# Patient Record
Sex: Female | Born: 1945 | Race: Black or African American | Hispanic: No | Smoking: Former smoker
Health system: Southern US, Community
[De-identification: ages and names within clinical notes are randomized; demographics above are authoritative.]

## PROBLEM LIST (undated history)

## (undated) DIAGNOSIS — M7060 Trochanteric bursitis, unspecified hip: Secondary | ICD-10-CM

## (undated) DIAGNOSIS — C50919 Malignant neoplasm of unspecified site of unspecified female breast: Secondary | ICD-10-CM

## (undated) DIAGNOSIS — E785 Hyperlipidemia, unspecified: Secondary | ICD-10-CM

## (undated) DIAGNOSIS — R21 Rash and other nonspecific skin eruption: Secondary | ICD-10-CM

## (undated) DIAGNOSIS — C801 Malignant (primary) neoplasm, unspecified: Secondary | ICD-10-CM

## (undated) DIAGNOSIS — Z9289 Personal history of other medical treatment: Secondary | ICD-10-CM

## (undated) DIAGNOSIS — M1990 Unspecified osteoarthritis, unspecified site: Secondary | ICD-10-CM

## (undated) DIAGNOSIS — Z9889 Other specified postprocedural states: Principal | ICD-10-CM

## (undated) DIAGNOSIS — I10 Essential (primary) hypertension: Secondary | ICD-10-CM

## (undated) DIAGNOSIS — M549 Dorsalgia, unspecified: Secondary | ICD-10-CM

## (undated) DIAGNOSIS — M069 Rheumatoid arthritis, unspecified: Secondary | ICD-10-CM

## (undated) DIAGNOSIS — Z923 Personal history of irradiation: Secondary | ICD-10-CM

## (undated) HISTORY — DX: Rash and other nonspecific skin eruption: R21

## (undated) HISTORY — DX: Dorsalgia, unspecified: M54.9

## (undated) HISTORY — DX: Essential (primary) hypertension: I10

## (undated) HISTORY — DX: Personal history of other medical treatment: Z92.89

## (undated) HISTORY — PX: OTHER SURGICAL HISTORY: SHX169

## (undated) HISTORY — DX: Unspecified osteoarthritis, unspecified site: M19.90

## (undated) HISTORY — PX: ABDOMINAL HYSTERECTOMY: SHX81

## (undated) HISTORY — PX: BREAST SURGERY: SHX581

## (undated) HISTORY — DX: Malignant (primary) neoplasm, unspecified: C80.1

## (undated) HISTORY — DX: Trochanteric bursitis, unspecified hip: M70.60

## (undated) HISTORY — DX: Hyperlipidemia, unspecified: E78.5

## (undated) HISTORY — DX: Other specified postprocedural states: Z98.890

## (undated) HISTORY — PX: APPENDECTOMY: SHX54

## (undated) HISTORY — DX: Rheumatoid arthritis, unspecified: M06.9

---

## 1989-12-03 HISTORY — PX: BREAST EXCISIONAL BIOPSY: SUR124

## 1999-05-25 ENCOUNTER — Other Ambulatory Visit: Admission: RE | Admit: 1999-05-25 | Discharge: 1999-05-25 | Payer: Self-pay | Admitting: Obstetrics

## 1999-11-24 ENCOUNTER — Emergency Department (HOSPITAL_COMMUNITY): Admission: EM | Admit: 1999-11-24 | Discharge: 1999-11-24 | Payer: Self-pay | Admitting: Emergency Medicine

## 2002-02-19 ENCOUNTER — Encounter: Admission: RE | Admit: 2002-02-19 | Discharge: 2002-02-19 | Payer: Self-pay | Admitting: Family Medicine

## 2002-02-19 ENCOUNTER — Encounter: Payer: Self-pay | Admitting: Family Medicine

## 2002-03-04 ENCOUNTER — Encounter: Payer: Self-pay | Admitting: Obstetrics

## 2002-03-04 ENCOUNTER — Encounter: Admission: RE | Admit: 2002-03-04 | Discharge: 2002-03-04 | Payer: Self-pay | Admitting: Obstetrics

## 2003-09-08 ENCOUNTER — Encounter: Payer: Self-pay | Admitting: Obstetrics

## 2003-09-08 ENCOUNTER — Encounter: Admission: RE | Admit: 2003-09-08 | Discharge: 2003-09-08 | Payer: Self-pay | Admitting: Obstetrics

## 2003-12-01 ENCOUNTER — Ambulatory Visit (HOSPITAL_COMMUNITY): Admission: RE | Admit: 2003-12-01 | Discharge: 2003-12-01 | Payer: Self-pay | Admitting: Gastroenterology

## 2004-09-08 ENCOUNTER — Encounter: Admission: RE | Admit: 2004-09-08 | Discharge: 2004-09-08 | Payer: Self-pay | Admitting: Obstetrics

## 2005-10-08 ENCOUNTER — Encounter: Admission: RE | Admit: 2005-10-08 | Discharge: 2005-10-08 | Payer: Self-pay | Admitting: Obstetrics

## 2006-10-10 ENCOUNTER — Encounter: Admission: RE | Admit: 2006-10-10 | Discharge: 2006-10-10 | Payer: Self-pay | Admitting: Obstetrics

## 2007-10-17 ENCOUNTER — Encounter: Admission: RE | Admit: 2007-10-17 | Discharge: 2007-10-17 | Payer: Self-pay | Admitting: Obstetrics

## 2008-11-15 ENCOUNTER — Encounter: Admission: RE | Admit: 2008-11-15 | Discharge: 2008-11-15 | Payer: Self-pay | Admitting: Obstetrics

## 2009-01-11 ENCOUNTER — Encounter: Admission: RE | Admit: 2009-01-11 | Discharge: 2009-01-11 | Payer: Self-pay | Admitting: Rheumatology

## 2010-09-12 ENCOUNTER — Encounter: Admission: RE | Admit: 2010-09-12 | Discharge: 2010-09-12 | Payer: Self-pay | Admitting: Obstetrics

## 2010-09-21 ENCOUNTER — Encounter: Admission: RE | Admit: 2010-09-21 | Discharge: 2010-09-21 | Payer: Self-pay | Admitting: Obstetrics

## 2010-12-23 ENCOUNTER — Encounter: Payer: Self-pay | Admitting: Obstetrics

## 2011-04-20 NOTE — Op Note (Signed)
NAME:  Melissa Perez, Melissa Perez NO.:  [id number]   MEDICAL RECORD NO.:  [id number]                   PATIENT TYPE:  AMB   LOCATION:  ENDO                                 FACILITY:  [facility]   PHYSICIAN:  Graylin Shiver, M.D.                DATE OF BIRTH:  03/20/1946   DATE OF PROCEDURE:  12/01/2003  DATE OF DISCHARGE:                                 OPERATIVE REPORT   PROCEDURE:  Colonoscopy.   INDICATIONS FOR PROCEDURE:  Rectal bleeding, family history of colon cancer.   Informed consent was obtained after explanation of the risks of bleeding,  infection and perforation.   PREMEDICATION:  Fentanyl 62.5 mcg IV, Versed 6 mg IV.   DESCRIPTION OF PROCEDURE:  With the patient in the left lateral decubitus  position, a rectal exam was performed, no masses were felt. There were some  external hemorrhoidal tags.  The Olympus pediatric adjustable colonoscope  was inserted into the rectum and advanced around a tortous colon to the  cecum.  Cecal landmarks were identified. The cecum and ascending colon were  normal, the transverse colon was normal. The descending colon, sigmoid and  rectum were normal.  She tolerated the procedure well without complications.  The scope was retroflexed in the rectum, no abnormalities were seen.   IMPRESSION:  Normal colonoscopy to the cecum with some external hemorrhoids.                                               Graylin Shiver, M.D.    Melissa Perez  D:  12/01/2003  T:  12/01/2003  Job:  [id number]   cc:   [name], M.D.  [address]  [place]  [place] [address]  Fax: [phone]

## 2011-08-27 ENCOUNTER — Other Ambulatory Visit: Payer: Self-pay | Admitting: Obstetrics

## 2011-08-27 DIAGNOSIS — Z1231 Encounter for screening mammogram for malignant neoplasm of breast: Secondary | ICD-10-CM

## 2011-09-24 ENCOUNTER — Ambulatory Visit
Admission: RE | Admit: 2011-09-24 | Discharge: 2011-09-24 | Disposition: A | Discharge status: Home / Self Care | Payer: BC Managed Care – PPO | Source: Ambulatory Visit | Attending: Obstetrics | Admitting: Obstetrics

## 2011-09-24 DIAGNOSIS — Z1231 Encounter for screening mammogram for malignant neoplasm of breast: Secondary | ICD-10-CM

## 2011-09-26 ENCOUNTER — Other Ambulatory Visit: Payer: Self-pay | Admitting: Obstetrics

## 2011-09-26 DIAGNOSIS — R928 Other abnormal and inconclusive findings on diagnostic imaging of breast: Secondary | ICD-10-CM

## 2011-10-11 ENCOUNTER — Ambulatory Visit
Admission: RE | Admit: 2011-10-11 | Discharge: 2011-10-11 | Disposition: A | Discharge status: Home / Self Care | Payer: BC Managed Care – PPO | Source: Ambulatory Visit | Attending: Obstetrics | Admitting: Obstetrics

## 2011-10-11 DIAGNOSIS — R928 Other abnormal and inconclusive findings on diagnostic imaging of breast: Secondary | ICD-10-CM

## 2011-10-16 ENCOUNTER — Other Ambulatory Visit: Payer: Self-pay | Admitting: Obstetrics

## 2011-10-16 DIAGNOSIS — R921 Mammographic calcification found on diagnostic imaging of breast: Secondary | ICD-10-CM

## 2011-10-23 ENCOUNTER — Other Ambulatory Visit: Payer: Self-pay | Admitting: Obstetrics

## 2011-10-23 ENCOUNTER — Ambulatory Visit
Admission: RE | Admit: 2011-10-23 | Discharge: 2011-10-23 | Disposition: A | Discharge status: Home / Self Care | Payer: BC Managed Care – PPO | Source: Ambulatory Visit | Attending: Obstetrics | Admitting: Obstetrics

## 2011-10-23 DIAGNOSIS — R921 Mammographic calcification found on diagnostic imaging of breast: Secondary | ICD-10-CM

## 2011-10-23 DIAGNOSIS — Z9889 Other specified postprocedural states: Secondary | ICD-10-CM

## 2011-10-24 ENCOUNTER — Other Ambulatory Visit: Payer: Self-pay | Admitting: Obstetrics

## 2011-10-24 ENCOUNTER — Ambulatory Visit
Admission: RE | Admit: 2011-10-24 | Discharge: 2011-10-24 | Disposition: A | Discharge status: Home / Self Care | Payer: BC Managed Care – PPO | Source: Ambulatory Visit | Attending: Obstetrics | Admitting: Obstetrics

## 2011-10-24 DIAGNOSIS — Z9889 Other specified postprocedural states: Secondary | ICD-10-CM

## 2011-10-24 DIAGNOSIS — C50911 Malignant neoplasm of unspecified site of right female breast: Secondary | ICD-10-CM

## 2011-10-29 ENCOUNTER — Other Ambulatory Visit: Payer: Self-pay | Admitting: *Deleted

## 2011-10-29 ENCOUNTER — Telehealth: Payer: Self-pay | Admitting: *Deleted

## 2011-10-29 ENCOUNTER — Ambulatory Visit
Admission: RE | Admit: 2011-10-29 | Discharge: 2011-10-29 | Disposition: A | Discharge status: Home / Self Care | Payer: BC Managed Care – PPO | Source: Ambulatory Visit | Attending: Obstetrics | Admitting: Obstetrics

## 2011-10-29 DIAGNOSIS — C50911 Malignant neoplasm of unspecified site of right female breast: Secondary | ICD-10-CM

## 2011-10-29 DIAGNOSIS — D0510 Intraductal carcinoma in situ of unspecified breast: Secondary | ICD-10-CM

## 2011-10-29 MED ORDER — GADOBENATE DIMEGLUMINE 529 MG/ML IV SOLN
15.0000 mL | Freq: Once | INTRAVENOUS | Status: AC | PRN
  Administered 2011-10-29: 15 mL via INTRAVENOUS

## 2011-10-29 NOTE — Telephone Encounter (Signed)
Confirmed [facility] for 10/31/11 at 0815 .  Instructions and contact information given.  

## 2011-10-31 ENCOUNTER — Ambulatory Visit
Admission: RE | Admit: 2011-10-31 | Discharge: 2011-10-31 | Disposition: A | Discharge status: Home / Self Care | Payer: BC Managed Care – PPO | Source: Ambulatory Visit | Attending: Radiation Oncology | Admitting: Radiation Oncology

## 2011-10-31 ENCOUNTER — Ambulatory Visit (HOSPITAL_BASED_OUTPATIENT_CLINIC_OR_DEPARTMENT_OTHER): Payer: BC Managed Care – PPO | Admitting: Oncology

## 2011-10-31 ENCOUNTER — Encounter (INDEPENDENT_AMBULATORY_CARE_PROVIDER_SITE_OTHER): Payer: Self-pay | Admitting: General Surgery

## 2011-10-31 ENCOUNTER — Encounter: Payer: Self-pay | Admitting: Oncology

## 2011-10-31 ENCOUNTER — Other Ambulatory Visit (HOSPITAL_BASED_OUTPATIENT_CLINIC_OR_DEPARTMENT_OTHER): Payer: BC Managed Care – PPO | Admitting: Lab

## 2011-10-31 ENCOUNTER — Ambulatory Visit (HOSPITAL_BASED_OUTPATIENT_CLINIC_OR_DEPARTMENT_OTHER): Payer: BC Managed Care – PPO | Admitting: General Surgery

## 2011-10-31 ENCOUNTER — Telehealth: Payer: Self-pay | Admitting: *Deleted

## 2011-10-31 ENCOUNTER — Ambulatory Visit: Payer: BC Managed Care – PPO | Admitting: Physical Therapy

## 2011-10-31 ENCOUNTER — Ambulatory Visit: Payer: BC Managed Care – PPO

## 2011-10-31 DIAGNOSIS — C50519 Malignant neoplasm of lower-outer quadrant of unspecified female breast: Secondary | ICD-10-CM

## 2011-10-31 DIAGNOSIS — D0590 Unspecified type of carcinoma in situ of unspecified breast: Secondary | ICD-10-CM

## 2011-10-31 DIAGNOSIS — D0510 Intraductal carcinoma in situ of unspecified breast: Secondary | ICD-10-CM

## 2011-10-31 DIAGNOSIS — Z510 Encounter for antineoplastic radiation therapy: Secondary | ICD-10-CM | POA: Insufficient documentation

## 2011-10-31 DIAGNOSIS — Z803 Family history of malignant neoplasm of breast: Secondary | ICD-10-CM

## 2011-10-31 DIAGNOSIS — C50511 Malignant neoplasm of lower-outer quadrant of right female breast: Secondary | ICD-10-CM | POA: Diagnosis present

## 2011-10-31 DIAGNOSIS — Z170 Estrogen receptor positive status [ER+]: Secondary | ICD-10-CM

## 2011-10-31 DIAGNOSIS — I10 Essential (primary) hypertension: Secondary | ICD-10-CM

## 2011-10-31 LAB — CBC WITH DIFFERENTIAL/PLATELET
Basophils Absolute: 0.1 10*3/uL (ref 0.0–0.1)
Eosinophils Absolute: 0.1 10*3/uL (ref 0.0–0.5)
HGB: 13.9 g/dL (ref 11.6–15.9)
MCV: 90.8 fL (ref 79.5–101.0)
MONO#: 0.4 10*3/uL (ref 0.1–0.9)
NEUT#: 3 10*3/uL (ref 1.5–6.5)
RDW: 13.4 % (ref 11.2–14.5)
WBC: 6.7 10*3/uL (ref 3.9–10.3)
lymph#: 3.2 10*3/uL (ref 0.9–3.3)

## 2011-10-31 LAB — COMPREHENSIVE METABOLIC PANEL
Albumin: 4 g/dL (ref 3.5–5.2)
BUN: 12 mg/dL (ref 6–23)
CO2: 30 mEq/L (ref 19–32)
Calcium: 10 mg/dL (ref 8.4–10.5)
Chloride: 102 mEq/L (ref 96–112)
Glucose, Bld: 90 mg/dL (ref 70–99)
Potassium: 3.8 mEq/L (ref 3.5–5.3)
Sodium: 141 mEq/L (ref 135–145)
Total Protein: 7.1 g/dL (ref 6.0–8.3)

## 2011-10-31 NOTE — Progress Notes (Signed)
Patient ID: Melissa Perez, female   DOB: 10/30/1946, 65 y.o.   MRN: [id number]  No chief complaint on file.   HPI Melissa Perez is a 65 y.o. female.   HPI She is referred by Dr. 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 On a screening mammogram, she had abnormal microcalcifications in this area. Image guided biopsy demonstrated the pathology as above. Hormone receptors are positive. She denies any masses in her breast or nipple discharge. She does have a family history of breast cancer. Age at menarche was 14. She had her first pregnancy at age 20. Menopause was at age 39. No hormone replacement treatment.  Past Medical History  Diagnosis Date  . Arthritis     rheumatoid, take Humira  . Hypertension   . Hyperlipidemia     Past Surgical History  Procedure Date  . Abdominal hysterectomy   . Appendectomy   . Left breast biopsies 1991 & 1990  . Breast surgery     2 left breast biopsies-benign    Family History  Problem Relation Age of Onset  . Breast cancer Mother   . Cancer Mother   . Kidney cancer Father   . Cancer Father   . Breast cancer Sister   . Cancer Sister   . Lung cancer Sister   . Colon cancer Maternal Uncle     Social History History  Substance Use Topics  . Smoking status: Never Smoker   . Smokeless tobacco: Not on file  . Alcohol Use: 1.8 oz/week    3 Glasses of wine per week    No Known Allergies  Current Outpatient Prescriptions  Medication Sig Dispense Refill  . calcium carbonate (TUMS - DOSED IN MG ELEMENTAL CALCIUM) 500 MG chewable tablet Chew 1 tablet by mouth as needed.        . iron polysaccharides (NIFEREX 60) 40-20 MG capsule Take 1 capsule by mouth 3 (three) times daily with meals.        . loratadine (CLARITIN) 10 MG tablet Take 10 mg by mouth daily.        . multivitamin (THERAGRAN) per tablet Take 1 tablet by mouth daily.        .  valsartan-hydrochlorothiazide (DIOVAN-HCT) 80-12.5 MG per tablet Take 1 tablet by mouth daily.          Review of Systems Review of Systems  Constitutional: Positive for fatigue.  HENT:       Sinus problems  Eyes: Negative.   Respiratory: Negative.   Cardiovascular: Negative.   Gastrointestinal: Negative.   Genitourinary: Negative.   Musculoskeletal: Positive for arthralgias.  Neurological: Negative.   Hematological: Negative.   Psychiatric/Behavioral: Negative.     There were no vitals taken for this visit.  Physical Exam Physical Exam  Constitutional: She is oriented to person, place, and time. She appears well-developed and well-nourished. No distress.  HENT:  Head: Normocephalic and atraumatic.  Eyes: Conjunctivae and EOM are normal.  Neck: Neck supple. No thyromegaly present.  Cardiovascular: Normal rate and regular rhythm.   No murmur heard. Pulmonary/Chest: Effort normal and breath sounds normal. She has no wheezes.       Left breast-2 scars are present with mild indentation medially. No palpable masses.  Right breast-small scar inferior aspect. No palpable masses.  Lymph nodes-no palpable axillary or supraclavicular adenopathy  Abdominal: Soft. Bowel sounds are normal. She exhibits no   distension and no mass.       Midline scar  Musculoskeletal: Normal range of motion. She exhibits no edema and no tenderness.  Lymphadenopathy:    She has no cervical adenopathy.  Neurological: She is alert and oriented to person, place, and time.       Normal motor strength  Skin: Skin is warm and dry.  Psychiatric: She has a normal mood and affect. Her behavior is normal.    Data Reviewed Imaging results. Pathology results.  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 She is a good candidate for breast conservation therapy. She is interested in this. We did discuss mastectomy. She will be getting genetic testing and if this is negative we can  proceed with right partial mastectomy and sentinel lymph node biopsy. It is positive, we will discuss bilateral mastectomies with her.    Plan    Right partial mastectomy and sentinel little biopsy pending genetic results.  I have explained the procedure, risks, and aftercare.  The risks include but are not limited to bleeding, infection, wound problems, seroma formation, anesthesia, nerve injury, lymphedema, need for reexcision or removal of more lymph nodes at a later time.  She seems to understand and agrees with the plan.       Gladiola Madore J 10/31/2011, 10:25 AM    

## 2011-10-31 NOTE — Patient Instructions (Signed)
My office will call you to schedule your surgery once we have the results of your genetic testing.

## 2011-10-31 NOTE — Telephone Encounter (Signed)
gave patient appointment for 11-20-2011 at 8:30am

## 2011-10-31 NOTE — Progress Notes (Signed)
Melissa Perez [id number] [birth year] 65 y.o. 10/31/2011 1:14 PM  CC  Dr. Renaye Rakers Dr. Francoise Ceo Dr. Pollyann Savoy     REASON FOR CONSULTATION:  65 year old female with new diagnosis of ductal carcinoma in situ grade 2 with calcifications of the right breast at the 6:00 position 8 cm from the nipple appear the tumor was ER positive PR positive. Measuring 3.7 cm by MRI.  Patient was seen in the Multidisciplinary Breast Clinic for discussion of her treatment options. She was seen by Dr. [name], [facility]  REFERRING PHYSICIAN: Dr. Avel Peace  HISTORY OF PRESENT ILLNESS:  Melissa Perez is a 65 y.o. female.  With medical history significant for hypertension and rheumatoid arthritis. Patient began having screening mammograms at the age of somewhere in the 2s. Her first mammogram was ordered by Dr. Gaynell Face many years ago according to the patient. Most recently she presented for a screening mammogram in October 2012. This revealed calcifications in the right breast. She went on to have a diagnostic digital mammogram of the right breast on 10/11/2011. There was noted to be small 3 mm cluster of heterogeneous calcifications in the inferior right breast. Because of this she went on to have a needle core biopsy performed at the 6:00 position. The pathology revealed a ductal carcinoma in situ grade 3 with calcifications. The tumor was ER positive PR positive suspicious for involving an underlying sclerosis lesion. She then went on to have MRI of the breasts performed on 10/29/2011. The MRI in the right breast showed at the 6:00 location and area of segmental clumped nodular enhancement measuring 3.7 x 1.7 x 1.4 cm.  She is now seen in the multidisciplinary clinic for discussion of her treatment options. She is without any complaints.   Past Medical History: Past Medical History  Diagnosis Date  . Arthritis     rheumatoid,  take Humira  . Hypertension   . Hyperlipidemia     Past Surgical History: Past Surgical History  Procedure Date  . Abdominal hysterectomy   . Appendectomy   . Left breast biopsies 1991 & 1990  . Breast surgery     2 left breast biopsies-benign    Family History: Family History  Problem Relation Age of Onset  . Breast cancer Mother   . Cancer Mother   . Kidney cancer Father   . Cancer Father   . Breast cancer Sister   . Cancer Sister   . Lung cancer Sister   . Colon cancer Maternal Uncle     Social History: Patient is an Production designer, theatre/television/film for higher education. She is divorced she has one son who is 73 and is in the [place] [employer] lieutenant. History  Substance Use Topics  . Smoking status: Never Smoker   . Smokeless tobacco: Not on file  . Alcohol Use: 1.8 oz/week    3 Glasses of wine per week    Allergies: No Known Allergies  Current Medications: Current Outpatient Prescriptions  Medication Sig Dispense Refill  . calcium carbonate (TUMS - DOSED IN MG ELEMENTAL CALCIUM) 500 MG chewable tablet Chew 1 tablet by mouth as needed.        . iron polysaccharides (NIFEREX 60) 40-20 MG capsule Take 1 capsule by mouth 3 (three) times daily with meals.        Marland Kitchen loratadine (CLARITIN) 10 MG tablet Take 10 mg by mouth daily.        . multivitamin (THERAGRAN) per tablet Take 1  tablet by mouth daily.        . valsartan-hydrochlorothiazide (DIOVAN-HCT) 80-12.5 MG per tablet Take 1 tablet by mouth daily.          OB/GYN History: Menarche at age 38 she underwent menopause in November 1986. She had about 2 weeks' worth of hormone replacement therapy. She has given birth to one son at the age of 54. She did use birth control pills from 27 age 58 to about 65 years of age off-and-on.     Prior History of Cancer: She has no prior history of breast cancer but she has had breast biopsies in the past of the left breast.  Health Maintenance:  Colonoscopy her last colonoscopy was in 2006. Bone  Density last bone density was in summer of 2012. Last PAP smear last Pap smear performed was in 2011.  ECOG PERFORMANCE STATUS: 0 - Asymptomatic    REVIEW OF SYSTEMS:  Constitutional: positive for fatigue and night sweats Eyes: negative Ears, nose, mouth, throat, and face: positive for nasal congestion Respiratory: negative Cardiovascular: negative Gastrointestinal: negative Genitourinary:negative Integument/breast: negative Hematologic/lymphatic: positive for easy bruising Musculoskeletal:positive for arthralgias, bone pain and stiff joints Neurological: negative Behavioral/Psych: negative Endocrine: negative Allergic/Immunologic: negative  PHYSICAL EXAMINATION: Blood pressure 124/79, pulse 76, temperature 98.7 F (37.1 C), temperature source Oral, height 5' 2.5" (1.588 m), weight 164 lb 9.6 oz (74.662 kg).  ZOX:WRUEA, healthy, no distress, well nourished and well developed SKIN: skin color, texture, turgor are normal, no rashes or significant lesions HEAD: Normocephalic, No masses, lesions, tenderness or abnormalities EYES: PERRLA, EOMI, Conjunctiva are pink and non-injected, sclera clear EARS: External ears normal OROPHARYNX:no exudate, no erythema and lips, buccal mucosa, and tongue normal  NECK: supple, no adenopathy, no bruits, no JVD, thyroid normal size, non-tender, without nodularity LYMPH:  no palpable lymphadenopathy, no hepatosplenomegaly BREAST: Bilateral breasts are examined. Left breast does reveal previous lumpectomy scar in the upper inner quadrant. Right breast reveals a little bit of an indentation from her recent biopsy and some areas of ecchymosis otherwise no masses and no nipple discharge or retraction or inversion. LUNGS: clear to auscultation , and palpation, clear to auscultation and percussion HEART: regular rate & rhythm, no murmurs and no gallops ABDOMEN:abdomen soft, non-tender, normal bowel sounds and no masses or organomegaly BACK: Back  symmetric, no curvature., No CVA tenderness EXTREMITIES:no edema, no skin discoloration, no clubbing, no cyanosis  NEURO: alert & oriented x 3 with fluent speech, no focal motor/sensory deficits, gait normal, reflexes normal and symmetric    STUDIES/RESULTS: Mr Breast Bilateral W Wo Contrast  10/29/2011  *RADIOLOGY REPORT*  Clinical Data: Newly-diagnosed right breast six o'clock location DCIS manifesting as mammographically evident calcifications.  BUN and creatinine were obtained on site at [facility] at [address] Results:  BUN 9 mg/dL,  Creatinine 0.7 mg/dL.  BILATERAL BREAST MRI WITH AND WITHOUT CONTRAST  Technique: Multiplanar, multisequence MR images of both breasts were obtained prior to and following the intravenous administration of 15ml of Multihance.  Three dimensional images were evaluated at the independent DynaCad workstation.  Comparison:  Prior mammograms  Findings: There is a minimal background type parenchymal enhancement pattern.  No lymphadenopathy.  No abnormal T2-weighted hyperintensity apart from minimal right breast six o'clock location post biopsy change.  In the right breast six o'clock location with associated clip artifact is an area of segmental clumped nodular type enhancement measuring 3.7 x 1.7 x 1.4 cm (greatest dimension is in the anteroposterior orientation).  This corresponds to  the area of recently biopsy-proven DCIS.  No other area of abnormal enhancement is identified.  IMPRESSION: Area of abnormal non mass-like enhancement in the right breast six o'clock location corresponds to biopsy-proven DCIS.  No MRI evidence for multicentric or contralateral malignancy.  THREE-DIMENSIONAL MR IMAGE RENDERING ON INDEPENDENT WORKSTATION:  Three-dimensional MR images were rendered by post-processing of the original MR data on an independent workstation.  The three- dimensional MR images were interpreted, and findings were reported in the accompanying complete MRI  report for this study.  BI-RADS CATEGORY 6:  Known biopsy-proven malignancy - appropriate action should be taken.  Recommendation:  Treatment plan  Original Report Authenticated By: Harrel Lemon, M.D.   Mm Breast Stereo Biopsy Right  10/24/2011  *RADIOLOGY REPORT*  Clinical Data:  Microcalcifications at 6 o'clock 8 cm from the right nipple.  STEREOTACTIC-GUIDED VACUUM ASSISTED BIOPSY OF THE RIGHT BREAST AND SPECIMEN RADIOGRAPH  The patient and I discussed the procedure of stereotactic-guided biopsy, including risks, benefits and alternatives.  Specifically, we discussed the risks of infection, bleeding, tissue injury, clip migration and inadequate sampling.  Informed written consent was given.  Using sterile technique, 2% lidocaine, stereotactic guidance and a 9 gauge vacuum assisted device, biopsy was performed of calcifications at 6 o'clock 8 cm from the right nipple.  Specimen radiograph was performed, showing calcifications within a core specimen.  Specimens with calcifications are identified for pathology.  At the conclusion of the procedure, a top hat shaped tissue marker clip was deployed into the biopsy cavity.  Follow-up 2-view mammogram confirmed clip placement and removal of the calcifications.  IMPRESSION: Stereotactic-guided biopsy of microcalcifications at 6 o'clock 8 cm from the right nipple.  No apparent complications.  Original Report Authenticated By: Daryl Eastern, M.D.   Mm Breast Surgical Specimen  10/24/2011  *RADIOLOGY REPORT*  Clinical Data:  Microcalcifications at 6 o'clock 8 cm from the right nipple.  STEREOTACTIC-GUIDED VACUUM ASSISTED BIOPSY OF THE RIGHT BREAST AND SPECIMEN RADIOGRAPH  The patient and I discussed the procedure of stereotactic-guided biopsy, including risks, benefits and alternatives.  Specifically, we discussed the risks of infection, bleeding, tissue injury, clip migration and inadequate sampling.  Informed written consent was given.  Using sterile  technique, 2% lidocaine, stereotactic guidance and a 9 gauge vacuum assisted device, biopsy was performed of calcifications at 6 o'clock 8 cm from the right nipple.  Specimen radiograph was performed, showing calcifications within a core specimen.  Specimens with calcifications are identified for pathology.  At the conclusion of the procedure, a top hat shaped tissue marker clip was deployed into the biopsy cavity.  Follow-up 2-view mammogram confirmed clip placement and removal of the calcifications.  IMPRESSION: Stereotactic-guided biopsy of microcalcifications at 6 o'clock 8 cm from the right nipple.  No apparent complications.  Original Report Authenticated By: Daryl Eastern, M.D.   Mm Digital Diag Ltd R  10/11/2011  *RADIOLOGY REPORT*  Clinical Data:  Calcifications right breast identified on recent screening mammogram.  DIGITAL DIAGNOSTIC RIGHT MAMMOGRAM WITHOUT CAD  Comparison:  09/14/2011  Findings:  Magnification views of the  inferior and central right breast demonstrate a 3 mm tight cluster of slightly heterogeneous calcifications.  There is surrounding breast parenchyma is negative.  IMPRESSION: Small (3 mm) cluster of heterogeneous calcifications in the inferior right breast.  Ductal carcinoma in situ (DCIS) cannot be excluded.  Stereotactic biopsy is suggested.  The findings and biopsy procedure were discussed with the patient in person today. She has some concerns regarding the  possibility of infection, given some immune compromise related to treatment for rheumatoid arthritis.  She prefers to discuss the possibility of biopsy with Dr. Corliss Skains, prior to scheduling.  If she decides not to schedule a biopsy, we discussed that she should have a follow-up right mammogram in 6 months (May 2013).  BI-RADS CATEGORY 4:  Suspicious abnormality - biopsy should be considered.  Original Report Authenticated By: Britta Mccreedy, M.D.   Mm Radiologist Eval And Mgmt  10/24/2011  *RADIOLOGY REPORT*   ESTABLISHED PATIENT OFFICE VISIT - LEVEL II [phone])  Chief Complaint:  Patient presents for discussion of the pathologic findings.  She underwent stereotactic core needle biopsy of calcifications in the 6 o'clock position of the right breast on 10/23/2011.  History:  Screening mammogram performed on 09/24/2011 demonstrated calcifications in the 6 o'clock position of the right breast posteriorly.  Additional views demonstrated worrisome calcifications and stereotactic core needle biopsy was suggested and performed on 10/23/2011.  Exam:  The biopsy site in the inferior portion of the right  breast is clean and dry.  There is no sign of hematoma or infection.  Assessment and Plan:  Histologic evaluation demonstrates ductal carcinoma in situ, grade II, with calcifications.  This is concordant with the imaging findings.  Results were discussed with the patient.  She was scheduled to be seen in the [facility] on 11/28 08/12.  Breast MRI is scheduled for 10/29/2011.  The patient reports no complications from the procedure.  Her questions were answered.  Educational materials were given.  Original Report Authenticated By: Daryl Eastern, M.D.      Chemistry      Component Value Date/Time   NA 141 10/31/2011 0830   K 3.8 10/31/2011 0830   CL 102 10/31/2011 0830   CO2 30 10/31/2011 0830   BUN 12 10/31/2011 0830   CREATININE 0.87 10/31/2011 0830      Component Value Date/Time   CALCIUM 10.0 10/31/2011 0830   ALKPHOS 109 10/31/2011 0830   AST 31 10/31/2011 0830   ALT 33 10/31/2011 0830   BILITOT 0.3 10/31/2011 0830      Lab Results  Component Value Date   WBC 6.7 10/31/2011   HGB 13.9 10/31/2011   HCT 41.0 10/31/2011   MCV 90.8 10/31/2011   PLT 204 10/31/2011       PATHOLOGY: Right needle core biopsy from 10/23/2011 reveals a ductal carcinoma in situ grade 2 with calcifications ER positive PR positive  ASSESSMENT    65 year old female with  #1 new  diagnosis of ductal carcinoma in situ (Tis NX MX) of the right breast. She is currently status post needle core biopsy. The tumor was a DCIS grade 2 with calcifications ER/PR positive. Patient is seen in the multidisciplinary breast clinic for discussion of her treatment options. She was seen by Dr. [name] myself and Dr. Vick Frees or.    PLAN:    #1 patient was recommended a lumpectomy for breast conservation. This would then be followed by radiation therapy adjuvantly.  #2 she would receive adjuvant antiestrogen therapy consisting of tamoxifen or an aromatase inhibitor. Risks and benefits of this were discussed with the patient.  #3 if patient has purely DCIS then she would be a good candidate or be 45 study I did discuss this with her. Discussed this with her further once patient has had her definitive surgery.  #4 due to patient's family history we did recommend patient be seen by genetic counseling and possibly  had genetic testing. Patient was in agreement with this.  #5 patient will be seen back after her surgery       All questions were answered. The patient knows to call the clinic with any problems, questions or concerns. We can certainly see the patient much sooner if necessary.  Thank you so much for allowing me to participate in the care of Melissa Perez. I will continue to follow up the patient with you and assist in her care.  I spent 30 minutes counseling the patient face to face. The total time spent in the appointment was 40 minutes. [name], MD Medical/Oncology [facility] [phone] (beeper) [phone] (Office)  10/31/2011, 1:14 PM 10/31/2011, 1:14 PM

## 2011-11-01 ENCOUNTER — Other Ambulatory Visit (INDEPENDENT_AMBULATORY_CARE_PROVIDER_SITE_OTHER): Payer: Self-pay | Admitting: General Surgery

## 2011-11-01 DIAGNOSIS — C50911 Malignant neoplasm of unspecified site of right female breast: Secondary | ICD-10-CM

## 2011-11-01 DIAGNOSIS — C50511 Malignant neoplasm of lower-outer quadrant of right female breast: Secondary | ICD-10-CM | POA: Diagnosis present

## 2011-11-01 NOTE — Progress Notes (Signed)
CC:   Melissa Perez, M.D. Melissa Perez, M.D. Melissa Perez, M.D. Melissa Perez, M.D.  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  HISTORY OF PRESENT ILLNESS:  Melissa Perez is a pleasant 65 year old female who presented for a regular screening mammogram.  She was found have a 15-mm area of calcifications.  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  This was ER and PR positive.  An MRI was performed on 10/29/2011 revealing 3.7 x 1.7 x 1.4 cm lesion likely reflecting larger biopsy change.  She has recovered well from her biopsy.  She presents today for my opinion regarding radiation in management of her disease.  PAST MEDICAL HISTORY: 1. Status post appendectomy in the late 1990s. 2. Status post left breast excisional biopsies in 1990 and 1991. 3. Status post total abdominal hysterectomy, bilateral salpingo-     oophorectomy in 1986. 4. Rheumatoid arthritis.  MEDICATIONS:  Humira, Diovan, Women's One-A-Day, iron, Claritin, calcium.  ALLERGIES:  No known drug allergies.  FAMILY HISTORY:  She had a mother with breast cancer in her 22s, a maternal aunt with breast cancer in her 58s, another maternal uncle with colon cancer and a father with kidney cancer.  A paternal aunt had colon cancer.  SOCIAL HISTORY:  She works at [employer].  She is divorced.  She has a son who is the [employer].  She denies any tobacco use.  She drinks 3-4 drinks of wine per week.  GYN HISTORY:  Menarche at 49.  Last period in 1986 after she had her hysterectomy.  She took hormone replacement for about 2 weeks and quit. GX, P1.  REVIEW OF SYSTEMS:  Positive for night sweats, fever, pain in her left thumb and right foot, muscle aches, wearing glasses,  sinus problems, dentures, hoarseness, skin rash, arthritis and rheumatoid arthritis. All other systems are reviewed and found to be negative.  PHYSICAL EXAMINATION:  She is a pleasant female in no distress sitting comfortably on the exam room table.  Vital signs:  Weight 164 pounds. Height 5 feet 3 inches.  Blood pressure 124/79, pulse 76, respirations 20, temperature 98.7.  She has no palpable cervical or supraclavicular adenopathy.  She has no palpable axillary adenopathy.  She has some tenderness in the medial portion of her right breast consistent with her biopsy site.  No other palpable abnormalities.  The left breast reveals a tissue defect medially consistent with her prior excisions.  There is a platelike palpable abnormality over the superior aspect of her left breast which she said is stable since her biopsies in the 1990s.  Heart: Regular rate and rhythm.  Lungs:  Clear to auscultation bilaterally.  IMPRESSION:  A 65 year old female with a newly diagnosed ductal carcinoma in situ.  I discussed with Melissa Perez her prognosis and options for treatment.  We discussed the role of radiation in decreasing local failure in patients who ultimately elect for breast conservation.  We discussed the process of simulation and the placement of tattoos.  We discussed 33 treatments as an outpatient beginning 4-6 weeks after surgery.  She is planning on attending the inauguration in [place], [place] and would like to start her radiation after that which should be no problem.  She will meet with Dr. Abbey Chatters for further discussion regarding surgical options well as  Dr. Welton Flakes for discussion of antiestrogen therapy or involvement on [id number].  We discussed possible side effects of radiation including but not limited to fatigue, skin redness, irritation, darkening and tissue defects.  We discussed secondary malignancies.  At the end of visit, Melissa Perez asked to see her MRI scan.  I will send that in  with her as well as the report with the next physician. She will also be referred to genetic counseling.    ______________________________ [name], M.D. SW/MEDQ  D:  10/31/2011  T:  10/31/2011  Job:  [id number]

## 2011-11-03 HISTORY — PX: BREAST LUMPECTOMY: SHX2

## 2011-11-05 ENCOUNTER — Ambulatory Visit: Payer: BC Managed Care – PPO

## 2011-11-05 NOTE — Progress Notes (Signed)
Blood drawn for BRCA1/BRCA2 and send to [facility]. TAT ~2 weeks.

## 2011-11-09 ENCOUNTER — Telehealth: Payer: Self-pay | Admitting: *Deleted

## 2011-11-09 NOTE — Telephone Encounter (Signed)
Message copied by Cooper Render on Fri Nov 09, 2011  4:05 PM ------      Message from: Melissa Perez      Created: Fri Nov 09, 2011  3:38 PM       Patient should be rescheduled with me 1 week after lumpectomy      Cancel appointment on 12/18

## 2011-11-12 ENCOUNTER — Telehealth: Payer: Self-pay | Admitting: *Deleted

## 2011-11-12 ENCOUNTER — Encounter: Payer: Self-pay | Admitting: *Deleted

## 2011-11-12 ENCOUNTER — Telehealth: Payer: Self-pay | Admitting: Oncology

## 2011-11-12 NOTE — Telephone Encounter (Signed)
Left VM for pt to return call concerning [facility] from 10/05/11.

## 2011-11-12 NOTE — Telephone Encounter (Signed)
per orders from [facility] called pts and r/s appts for 12/18 to 12/31.  called pts home lmovm with new appts d/t

## 2011-11-13 ENCOUNTER — Telehealth: Payer: Self-pay | Admitting: Genetic Counselor

## 2011-11-14 ENCOUNTER — Encounter: Payer: Self-pay | Admitting: *Deleted

## 2011-11-14 NOTE — Progress Notes (Signed)
Mailed after appt letter to pt. 

## 2011-11-15 ENCOUNTER — Other Ambulatory Visit (INDEPENDENT_AMBULATORY_CARE_PROVIDER_SITE_OTHER): Payer: Self-pay | Admitting: General Surgery

## 2011-11-15 DIAGNOSIS — C50919 Malignant neoplasm of unspecified site of unspecified female breast: Secondary | ICD-10-CM

## 2011-11-20 ENCOUNTER — Ambulatory Visit: Payer: BC Managed Care – PPO | Admitting: Oncology

## 2011-11-20 ENCOUNTER — Other Ambulatory Visit: Payer: BC Managed Care – PPO | Admitting: Lab

## 2011-11-21 ENCOUNTER — Encounter (HOSPITAL_BASED_OUTPATIENT_CLINIC_OR_DEPARTMENT_OTHER): Payer: Self-pay | Admitting: *Deleted

## 2011-11-21 ENCOUNTER — Other Ambulatory Visit: Payer: Self-pay

## 2011-11-21 ENCOUNTER — Encounter (HOSPITAL_BASED_OUTPATIENT_CLINIC_OR_DEPARTMENT_OTHER)
Admission: RE | Admit: 2011-11-21 | Discharge: 2011-11-21 | Disposition: A | Discharge status: Home / Self Care | Payer: BC Managed Care – PPO | Source: Ambulatory Visit | Attending: General Surgery | Admitting: General Surgery

## 2011-11-21 LAB — COMPREHENSIVE METABOLIC PANEL
ALT: 34 U/L (ref 0–35)
AST: 32 U/L (ref 0–37)
Alkaline Phosphatase: 115 U/L (ref 39–117)
CO2: 27 mEq/L (ref 19–32)
Calcium: 10.2 mg/dL (ref 8.4–10.5)
GFR calc Af Amer: >90 mL/min (ref >90)
GFR calc non Af Amer: 90 mL/min — ABNORMAL LOW (ref >90)
Glucose, Bld: 88 mg/dL (ref 70–99)
Potassium: 3.4 mEq/L — ABNORMAL LOW (ref 3.5–5.1)
Sodium: 142 mEq/L (ref 135–145)
Total Protein: 7.5 g/dL (ref 6.0–8.3)

## 2011-11-21 LAB — CBC
Hemoglobin: 14.6 g/dL (ref 12.0–15.0)
Platelets: 206 10*3/uL (ref 150–400)
RBC: 4.77 MIL/uL (ref 3.87–5.11)

## 2011-11-21 LAB — DIFFERENTIAL
Eosinophils Absolute: 0.1 10*3/uL (ref 0.0–0.7)
Eosinophils Relative: 1 % (ref 0–5)
Lymphocytes Relative: 50 % — ABNORMAL HIGH (ref 12–46)
Lymphs Abs: 3.2 10*3/uL (ref 0.7–4.0)
Monocytes Absolute: 0.5 10*3/uL (ref 0.1–1.0)

## 2011-11-21 LAB — PROTIME-INR: Prothrombin Time: 12.4 seconds (ref 11.6–15.2)

## 2011-11-21 NOTE — Progress Notes (Signed)
To come in for labs and ekg Pt very nervous Coming with cousin Has RA

## 2011-11-23 ENCOUNTER — Ambulatory Visit (HOSPITAL_COMMUNITY)
Admission: RE | Admit: 2011-11-23 | Discharge: 2011-11-23 | Disposition: A | Discharge status: Home / Self Care | Payer: BC Managed Care – PPO | Source: Ambulatory Visit | Attending: General Surgery | Admitting: General Surgery

## 2011-11-23 ENCOUNTER — Other Ambulatory Visit (HOSPITAL_COMMUNITY): Payer: BC Managed Care – PPO

## 2011-11-23 ENCOUNTER — Other Ambulatory Visit (INDEPENDENT_AMBULATORY_CARE_PROVIDER_SITE_OTHER): Payer: Self-pay | Admitting: General Surgery

## 2011-11-23 ENCOUNTER — Ambulatory Visit
Admission: RE | Admit: 2011-11-23 | Discharge: 2011-11-23 | Disposition: A | Discharge status: Home / Self Care | Payer: BC Managed Care – PPO | Source: Ambulatory Visit | Attending: General Surgery | Admitting: General Surgery

## 2011-11-23 ENCOUNTER — Encounter (HOSPITAL_BASED_OUTPATIENT_CLINIC_OR_DEPARTMENT_OTHER): Payer: Self-pay | Admitting: *Deleted

## 2011-11-23 ENCOUNTER — Ambulatory Visit (HOSPITAL_BASED_OUTPATIENT_CLINIC_OR_DEPARTMENT_OTHER): Payer: BC Managed Care – PPO | Admitting: Anesthesiology

## 2011-11-23 ENCOUNTER — Encounter (HOSPITAL_BASED_OUTPATIENT_CLINIC_OR_DEPARTMENT_OTHER): Payer: Self-pay | Admitting: Anesthesiology

## 2011-11-23 ENCOUNTER — Ambulatory Visit
Admit: 2011-11-23 | Discharge: 2011-11-23 | Disposition: A | Discharge status: Home / Self Care | Payer: BC Managed Care – PPO | Attending: General Surgery | Admitting: General Surgery

## 2011-11-23 ENCOUNTER — Ambulatory Visit (HOSPITAL_BASED_OUTPATIENT_CLINIC_OR_DEPARTMENT_OTHER)
Admission: RE | Admit: 2011-11-23 | Discharge: 2011-11-23 | Disposition: A | Discharge status: Home / Self Care | Payer: BC Managed Care – PPO | Source: Ambulatory Visit | Attending: General Surgery | Admitting: General Surgery

## 2011-11-23 ENCOUNTER — Encounter (HOSPITAL_BASED_OUTPATIENT_CLINIC_OR_DEPARTMENT_OTHER)
Admission: RE | Disposition: A | Discharge status: Home / Self Care | Payer: Self-pay | Source: Ambulatory Visit | Attending: General Surgery

## 2011-11-23 DIAGNOSIS — Z01810 Encounter for preprocedural cardiovascular examination: Secondary | ICD-10-CM | POA: Insufficient documentation

## 2011-11-23 DIAGNOSIS — D0590 Unspecified type of carcinoma in situ of unspecified breast: Secondary | ICD-10-CM | POA: Insufficient documentation

## 2011-11-23 DIAGNOSIS — C50919 Malignant neoplasm of unspecified site of unspecified female breast: Secondary | ICD-10-CM

## 2011-11-23 DIAGNOSIS — C50519 Malignant neoplasm of lower-outer quadrant of unspecified female breast: Secondary | ICD-10-CM

## 2011-11-23 DIAGNOSIS — D0510 Intraductal carcinoma in situ of unspecified breast: Secondary | ICD-10-CM

## 2011-11-23 DIAGNOSIS — Z01812 Encounter for preprocedural laboratory examination: Secondary | ICD-10-CM | POA: Insufficient documentation

## 2011-11-23 SURGERY — BREAST LUMPECTOMY WITH SENTINEL LYMPH NODE BX
Anesthesia: General | Site: Breast | Laterality: Right | Wound class: Clean

## 2011-11-23 MED ORDER — MORPHINE SULFATE 2 MG/ML IJ SOLN: 0.0500 mg/kg | INTRAMUSCULAR | Status: DC | PRN

## 2011-11-23 MED ORDER — DEXAMETHASONE SODIUM PHOSPHATE 4 MG/ML IJ SOLN
INTRAMUSCULAR | Status: DC | PRN
  Administered 2011-11-23: 10 mg via INTRAVENOUS

## 2011-11-23 MED ORDER — HYDROCODONE-ACETAMINOPHEN 5-325 MG PO TABS: 1.0000 | ORAL_TABLET | ORAL | Status: AC | PRN

## 2011-11-23 MED ORDER — CEFAZOLIN SODIUM 1-5 GM-% IV SOLN
1.0000 g | INTRAVENOUS | Status: AC
  Administered 2011-11-23: 1 g via INTRAVENOUS

## 2011-11-23 MED ORDER — SODIUM CHLORIDE 0.9 % IV SOLN: 250.0000 mL | INTRAVENOUS | Status: DC | PRN

## 2011-11-23 MED ORDER — FENTANYL CITRATE 0.05 MG/ML IJ SOLN
50.0000 ug | INTRAMUSCULAR | Status: DC | PRN
  Administered 2011-11-23: 50 ug via INTRAVENOUS

## 2011-11-23 MED ORDER — ACETAMINOPHEN 325 MG PO TABS: 650.0000 mg | ORAL_TABLET | ORAL | Status: DC | PRN

## 2011-11-23 MED ORDER — HYDROMORPHONE HCL PF 1 MG/ML IJ SOLN
0.2500 mg | INTRAMUSCULAR | Status: DC | PRN
  Administered 2011-11-23 (×3): 0.5 mg via INTRAVENOUS

## 2011-11-23 MED ORDER — PROPOFOL 10 MG/ML IV EMUL
INTRAVENOUS | Status: DC | PRN
  Administered 2011-11-23: 150 mg via INTRAVENOUS

## 2011-11-23 MED ORDER — PROMETHAZINE HCL 25 MG RE SUPP
25.0000 mg | Freq: Four times a day (QID) | RECTAL | Status: DC | PRN
  Administered 2011-11-23: 25 mg via RECTAL

## 2011-11-23 MED ORDER — SODIUM CHLORIDE 0.9 % IJ SOLN: 3.0000 mL | INTRAMUSCULAR | Status: DC | PRN

## 2011-11-23 MED ORDER — DROPERIDOL 2.5 MG/ML IJ SOLN
INTRAMUSCULAR | Status: DC | PRN
  Administered 2011-11-23: 0.625 mg via INTRAVENOUS

## 2011-11-23 MED ORDER — LIDOCAINE HCL (CARDIAC) 20 MG/ML IV SOLN
INTRAVENOUS | Status: DC | PRN
  Administered 2011-11-23: 50 mg via INTRAVENOUS

## 2011-11-23 MED ORDER — OXYCODONE HCL 5 MG PO TABS: 5.0000 mg | ORAL_TABLET | ORAL | Status: DC | PRN

## 2011-11-23 MED ORDER — LACTATED RINGERS IV SOLN
INTRAVENOUS | Status: DC
  Administered 2011-11-23 (×2): via INTRAVENOUS

## 2011-11-23 MED ORDER — MIDAZOLAM HCL 2 MG/2ML IJ SOLN
1.0000 mg | INTRAMUSCULAR | Status: DC | PRN
  Administered 2011-11-23: 1 mg via INTRAVENOUS

## 2011-11-23 MED ORDER — FENTANYL CITRATE 0.05 MG/ML IJ SOLN
INTRAMUSCULAR | Status: DC | PRN
  Administered 2011-11-23 (×4): 50 ug via INTRAVENOUS

## 2011-11-23 MED ORDER — EPHEDRINE SULFATE 50 MG/ML IJ SOLN
INTRAMUSCULAR | Status: DC | PRN
Start: 2011-11-23 — End: 2011-11-23
  Administered 2011-11-23: 10 mg via INTRAVENOUS

## 2011-11-23 MED ORDER — MIDAZOLAM HCL 2 MG/2ML IJ SOLN: 0.5000 mg | Freq: Once | INTRAMUSCULAR | Status: DC | PRN

## 2011-11-23 MED ORDER — BUPIVACAINE HCL (PF) 0.5 % IJ SOLN
INTRAMUSCULAR | Status: DC | PRN
  Administered 2011-11-23: 20 mL

## 2011-11-23 MED ORDER — PROMETHAZINE HCL 25 MG/ML IJ SOLN: 6.2500 mg | INTRAMUSCULAR | Status: DC | PRN

## 2011-11-23 MED ORDER — ACETAMINOPHEN 650 MG RE SUPP: 650.0000 mg | RECTAL | Status: DC | PRN

## 2011-11-23 MED ORDER — PROMETHAZINE HCL 25 MG/ML IJ SOLN: 12.5000 mg | Freq: Four times a day (QID) | INTRAMUSCULAR | Status: DC | PRN

## 2011-11-23 MED ORDER — ONDANSETRON HCL 4 MG/2ML IJ SOLN: 4.0000 mg | Freq: Four times a day (QID) | INTRAMUSCULAR | Status: DC | PRN

## 2011-11-23 MED ORDER — TECHNETIUM TC 99M SULFUR COLLOID FILTERED
1.0000 | Freq: Once | INTRAVENOUS | Status: AC | PRN
  Administered 2011-11-23: 1 via INTRADERMAL

## 2011-11-23 MED ORDER — ONDANSETRON HCL 4 MG/2ML IJ SOLN
INTRAMUSCULAR | Status: DC | PRN
  Administered 2011-11-23: 4 mg via INTRAVENOUS

## 2011-11-23 MED ORDER — MORPHINE SULFATE 2 MG/ML IJ SOLN: 2.0000 mg | INTRAMUSCULAR | Status: DC | PRN

## 2011-11-23 SURGICAL SUPPLY — 55 items
APPLIER CLIP 11 MED OPEN (CLIP)
BANDAGE ELASTIC 6 VELCRO ST LF (GAUZE/BANDAGES/DRESSINGS) IMPLANT
BENZOIN TINCTURE PRP APPL 2/3 (GAUZE/BANDAGES/DRESSINGS) ×2 IMPLANT
BINDER BREAST XLRG (GAUZE/BANDAGES/DRESSINGS) ×2 IMPLANT
BLADE SURG 10 STRL SS (BLADE) IMPLANT
BLADE SURG 15 STRL LF DISP TIS (BLADE) ×2 IMPLANT
BLADE SURG 15 STRL SS (BLADE) ×2
CANISTER SUCTION 1200CC (MISCELLANEOUS) ×2 IMPLANT
CHLORAPREP W/TINT 26ML (MISCELLANEOUS) ×2 IMPLANT
CLIP APPLIE 11 MED OPEN (CLIP) IMPLANT
CLOSURE STERI STRIP 1/2 X4 (GAUZE/BANDAGES/DRESSINGS) ×2 IMPLANT
CLOTH BEACON ORANGE TIMEOUT ST (SAFETY) ×2 IMPLANT
COVER MAYO STAND STRL (DRAPES) ×2 IMPLANT
COVER PROBE W GEL 5X96 (DRAPES) ×2 IMPLANT
COVER TABLE BACK 60X90 (DRAPES) ×2 IMPLANT
DECANTER SPIKE VIAL GLASS SM (MISCELLANEOUS) IMPLANT
DEVICE DUBIN W/COMP PLATE 8390 (MISCELLANEOUS) ×2 IMPLANT
DRAIN CHANNEL 19F RND (DRAIN) IMPLANT
DRAPE LAPAROSCOPIC ABDOMINAL (DRAPES) ×2 IMPLANT
DRAPE UTILITY XL STRL (DRAPES) ×2 IMPLANT
ELECT COATED BLADE 2.86 ST (ELECTRODE) ×2 IMPLANT
ELECT REM PT RETURN 9FT ADLT (ELECTROSURGICAL) ×2
ELECTRODE REM PT RTRN 9FT ADLT (ELECTROSURGICAL) ×1 IMPLANT
EVACUATOR SILICONE 100CC (DRAIN) IMPLANT
GLOVE BIOGEL PI IND STRL 8.5 (GLOVE) ×1 IMPLANT
GLOVE BIOGEL PI INDICATOR 8.5 (GLOVE) ×1
GLOVE ECLIPSE 6.5 STRL STRAW (GLOVE) ×4 IMPLANT
GLOVE ECLIPSE 8.0 STRL XLNG CF (GLOVE) ×2 IMPLANT
GOWN PREVENTION PLUS XLARGE (GOWN DISPOSABLE) ×4 IMPLANT
NDL SAFETY ECLIPSE 18X1.5 (NEEDLE) ×1 IMPLANT
NEEDLE HYPO 18GX1.5 SHARP (NEEDLE) ×1
NEEDLE HYPO 25X1 1.5 SAFETY (NEEDLE) ×4 IMPLANT
NS IRRIG 1000ML POUR BTL (IV SOLUTION) ×2 IMPLANT
PACK BASIN DAY SURGERY FS (CUSTOM PROCEDURE TRAY) ×2 IMPLANT
PENCIL BUTTON HOLSTER BLD 10FT (ELECTRODE) ×2 IMPLANT
PIN SAFETY STERILE (MISCELLANEOUS) IMPLANT
SLEEVE SCD COMPRESS KNEE MED (MISCELLANEOUS) ×2 IMPLANT
SPONGE GAUZE 4X4 12PLY (GAUZE/BANDAGES/DRESSINGS) ×2 IMPLANT
SPONGE GAUZE 4X4 16PLY NONSTR (GAUZE/BANDAGES/DRESSINGS) ×2 IMPLANT
SPONGE LAP 18X18 X RAY DECT (DISPOSABLE) IMPLANT
SPONGE LAP 4X18 X RAY DECT (DISPOSABLE) ×2 IMPLANT
STAPLER VISISTAT 35W (STAPLE) IMPLANT
STRIP CLOSURE SKIN 1/2X4 (GAUZE/BANDAGES/DRESSINGS) IMPLANT
SUT ETHILON 3 0 FSL (SUTURE) IMPLANT
SUT MNCRL AB 4-0 PS2 18 (SUTURE) ×4 IMPLANT
SUT SILK 2 0 SH (SUTURE) ×2 IMPLANT
SUT VIC AB 3-0 SH 27 (SUTURE)
SUT VIC AB 3-0 SH 27X BRD (SUTURE) IMPLANT
SUT VICRYL 3-0 CR8 SH (SUTURE) ×4 IMPLANT
SYR CONTROL 10ML LL (SYRINGE) ×4 IMPLANT
TOWEL OR 17X24 6PK STRL BLUE (TOWEL DISPOSABLE) ×4 IMPLANT
TOWEL OR NON WOVEN STRL DISP B (DISPOSABLE) IMPLANT
TUBE CONNECTING 20X1/4 (TUBING) ×2 IMPLANT
WATER STERILE IRR 1000ML POUR (IV SOLUTION) IMPLANT
YANKAUER SUCT BULB TIP NO VENT (SUCTIONS) ×2 IMPLANT

## 2011-11-23 NOTE — Anesthesia Procedure Notes (Addendum)
Procedure Name: LMA Insertion Date/Time: 11/23/2011 3:37 PM Performed by: Jearld Shines Pre-anesthesia Checklist: Patient identified, Emergency Drugs available, Suction available, Patient being monitored and Timeout performed Patient Re-evaluated:Patient Re-evaluated prior to inductionOxygen Delivery Method: Circle System Utilized Preoxygenation: Pre-oxygenation with 100% oxygen Intubation Type: IV induction Ventilation: Mask ventilation without difficulty LMA: LMA inserted LMA Size: 4.0 Number of attempts: 1 Airway Equipment and Method: bite block Placement Confirmation: positive ETCO2 and breath sounds checked- equal and bilateral Tube secured with: Tape Dental Injury: Teeth and Oropharynx as per pre-operative assessment  Comments: LMA insertion by J. Craft, CRNA. + ETCO2 , BSBE

## 2011-11-23 NOTE — Anesthesia Preprocedure Evaluation (Addendum)
Anesthesia Evaluation  Patient identified by MRN, date of birth, ID band Patient awake    Reviewed: Allergy & Precautions, H&P , NPO status , Patient's Chart, lab work & pertinent test results  Airway Mallampati: II      Dental   Pulmonary neg pulmonary ROS,  clear to auscultation        Cardiovascular hypertension, Pt. on medications neg cardio ROS Regular Normal    Neuro/Psych Negative Neurological ROS     GI/Hepatic negative GI ROS, Neg liver ROS, Patient received Oral Contrast Agents,  Endo/Other  Negative Endocrine ROS  Renal/GU negative Renal ROS     Musculoskeletal negative musculoskeletal ROS (+)   Abdominal   Peds  Hematology negative hematology ROS (+)   Anesthesia Other Findings   Reproductive/Obstetrics                          Anesthesia Physical Anesthesia Plan  ASA: III  Anesthesia Plan: General   Post-op Pain Management:    Induction: Intravenous  Airway Management Planned: LMA  Additional Equipment:   Intra-op Plan:   Post-operative Plan:   Informed Consent: I have reviewed the patients History and Physical, chart, labs and discussed the procedure including the risks, benefits and alternatives for the proposed anesthesia with the patient or authorized representative who has indicated his/her understanding and acceptance.   Dental Advisory Given  Plan Discussed with:   Anesthesia Plan Comments:        Anesthesia Quick Evaluation

## 2011-11-23 NOTE — Transfer of Care (Signed)
Immediate Anesthesia Transfer of Care Note  Patient: Melissa Perez  Procedure(s) Performed:  BREAST LUMPECTOMY WITH SENTINEL LYMPH NODE BX - right partial mastectomy with needle localization and right axillary sentinal lymph node biopsy   Patient Location: PACU  Anesthesia Type: General  Level of Consciousness: awake, oriented and patient cooperative  Airway & Oxygen Therapy: Patient Spontanous Breathing and Patient connected to face mask oxygen  Post-op Assessment: Report given to PACU RN, Post -op Vital signs reviewed and stable and Patient moving all extremities  Post vital signs: stable  Complications: No apparent anesthesia complications

## 2011-11-23 NOTE — H&P (View-Only) (Signed)
Patient ID: Melissa Perez, female   DOB: Jun 03, 1946, 65 y.o.   MRN: [id number]  No chief complaint on file.   HPI Melissa Perez is a 65 y.o. female.   HPI She is referred by Dr. Deboraha Sprang for evaluation of newly diagnosed ductal carcinoma in situ with comedo necrosis of the right breast at the 6:00 position 8 cm from the nipple. On a screening mammogram, she had abnormal microcalcifications in this area. Image guided biopsy demonstrated the pathology as above. Hormone receptors are positive. She denies any masses in her breast or nipple discharge. She does have a family history of breast cancer. Age at menarche was 33. She had her first pregnancy at age 70. Menopause was at age 89. No hormone replacement treatment.  Past Medical History  Diagnosis Date  . Arthritis     rheumatoid, take Humira  . Hypertension   . Hyperlipidemia     Past Surgical History  Procedure Date  . Abdominal hysterectomy   . Appendectomy   . Left breast biopsies 1991 & 1990  . Breast surgery     2 left breast biopsies-benign    Family History  Problem Relation Age of Onset  . Breast cancer Mother   . Cancer Mother   . Kidney cancer Father   . Cancer Father   . Breast cancer Sister   . Cancer Sister   . Lung cancer Sister   . Colon cancer Maternal Uncle     Social History History  Substance Use Topics  . Smoking status: Never Smoker   . Smokeless tobacco: Not on file  . Alcohol Use: 1.8 oz/week    3 Glasses of wine per week    No Known Allergies  Current Outpatient Prescriptions  Medication Sig Dispense Refill  . calcium carbonate (TUMS - DOSED IN MG ELEMENTAL CALCIUM) 500 MG chewable tablet Chew 1 tablet by mouth as needed.        . iron polysaccharides (NIFEREX 60) 40-20 MG capsule Take 1 capsule by mouth 3 (three) times daily with meals.        Marland Kitchen loratadine (CLARITIN) 10 MG tablet Take 10 mg by mouth daily.        . multivitamin (THERAGRAN) per tablet Take 1 tablet by mouth daily.        .  valsartan-hydrochlorothiazide (DIOVAN-HCT) 80-12.5 MG per tablet Take 1 tablet by mouth daily.          Review of Systems Review of Systems  Constitutional: Positive for fatigue.  HENT:       Sinus problems  Eyes: Negative.   Respiratory: Negative.   Cardiovascular: Negative.   Gastrointestinal: Negative.   Genitourinary: Negative.   Musculoskeletal: Positive for arthralgias.  Neurological: Negative.   Hematological: Negative.   Psychiatric/Behavioral: Negative.     There were no vitals taken for this visit.  Physical Exam Physical Exam  Constitutional: She is oriented to person, place, and time. She appears well-developed and well-nourished. No distress.  HENT:  Head: Normocephalic and atraumatic.  Eyes: Conjunctivae and EOM are normal.  Neck: Neck supple. No thyromegaly present.  Cardiovascular: Normal rate and regular rhythm.   No murmur heard. Pulmonary/Chest: Effort normal and breath sounds normal. She has no wheezes.       Left breast-2 scars are present with mild indentation medially. No palpable masses.  Right breast-small scar inferior aspect. No palpable masses.  Lymph nodes-no palpable axillary or supraclavicular adenopathy  Abdominal: Soft. Bowel sounds are normal. She exhibits no  distension and no mass.       Midline scar  Musculoskeletal: Normal range of motion. She exhibits no edema and no tenderness.  Lymphadenopathy:    She has no cervical adenopathy.  Neurological: She is alert and oriented to person, place, and time.       Normal motor strength  Skin: Skin is warm and dry.  Psychiatric: She has a normal mood and affect. Her behavior is normal.    Data Reviewed Imaging results. Pathology results.  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 She is a good candidate for breast conservation therapy. She is interested in this. We did discuss mastectomy. She will be getting genetic testing and if this is negative we can  proceed with right partial mastectomy and sentinel lymph node biopsy. It is positive, we will discuss bilateral mastectomies with her.    Plan    Right partial mastectomy and sentinel little biopsy pending genetic results.  I have explained the procedure, risks, and aftercare.  The risks include but are not limited to bleeding, infection, wound problems, seroma formation, anesthesia, nerve injury, lymphedema, need for reexcision or removal of more lymph nodes at a later time.  She seems to understand and agrees with the plan.       Matrice Herro J 10/31/2011, 10:25 AM

## 2011-11-23 NOTE — Addendum Note (Signed)
Addendum  created 11/23/11 1838 by Kerby Nora, MD   Modules edited:Orders

## 2011-11-23 NOTE — Anesthesia Postprocedure Evaluation (Signed)
  Anesthesia Post-op Note  Patient: Melissa Perez  Procedure(s) Performed:  BREAST LUMPECTOMY WITH SENTINEL LYMPH NODE BX - right partial mastectomy with needle localization and right axillary sentinal lymph node biopsy   Patient Location: PACU  Anesthesia Type: General  Level of Consciousness: awake  Airway and Oxygen Therapy: Patient Spontanous Breathing and Patient connected to face mask oxygen  Post-op Pain: mild  Post-op Assessment: Post-op Vital signs reviewed, Patient's Cardiovascular Status Stable, Respiratory Function Stable and Patent Airway  Post-op Vital Signs: Reviewed and stable  Complications: No apparent anesthesia complications

## 2011-11-23 NOTE — Interval H&P Note (Signed)
History and Physical Interval Note:  11/23/2011 3:25 PM  Melissa Perez  has presented today for surgery, with the diagnosis of right breast cancer.  Her genetic testing was negative.   The various methods of treatment have been discussed with the patient and family. After consideration of risks, benefits and other options for treatment, the patient has consented to  Procedure(s): BREAST LUMPECTOMY WITH SENTINEL LYMPH NODE BX as a surgical intervention .  The patients' history has been reviewed, patient examined, no change in status, stable for surgery.  I have reviewed the patients' chart and labs.  Questions were answered to the patient's satisfaction.     Melissa Perez [facility]

## 2011-11-23 NOTE — Progress Notes (Signed)
Emotional support provided to patient during sentinel node injection. VSS

## 2011-11-23 NOTE — Op Note (Signed)
Procedure(s):  RIGHT BREAST LUMPECTOMY WITH SENTINEL LYMPH NODE BX Procedure Note  Melissa Perez female 65 y.o. 11/23/2011  Procedure(s) and Anesthesia Type:    *RIGHT BREAST LUMPECTOMY WITH RIGHT AXILLARY MAPPING AND  SENTINEL LYMPH NODE BX - General  Surgeon(s) and Role:    * Adolph Pollack, MD - Primary   Indications:   This is a 65 year old female found to have a suspicious area in the right breast inferiorly and mammogram. Image guided biopsy was consistent with ductal carcinoma in situ there was high-grade with comedonecrosis. She now presents for the above procedure.        Surgeon: Adolph Pollack   Assistants: none  Anesthesia: General LMA anesthesia  ASA Class: 2    Procedure Detail:  She underwent successful needle localization at the breast. She then had radioactive injection in the right circumareolar area in the holding room. Her right breast was marked with my initials. She was then brought to the operating room placed supine on the operating table and a general anesthetic was administered. Using the neoprobe I found an area of increased counts in the right axilla and marked to this. I then removed a bandage on the wiring cut the wire close to the skin. The right breast and axillary areas were sterilely prepped and draped.  A transverse incision was made in the inferior right axilla and dissection carried through the subcutaneous tissue. Using the neoprobe I identified a lymph node with increased counts and excised this. This was sent as sentinel lymph node #1. I placed the neoprobe back into the right axilla and no areas of increased counts were noted. I anesthetized this wound with Marcaine local anesthetic. Hemostasis was adequate. The subcutaneous tissue was closed with interrupted 3-0 Vicryl sutures. The skin was closed with 4-0 Monocryl subcuticular stitch.  I then approached the inferior aspect of the right breast. An elliptical incision was made in the  inferior aspect of the right breast to include the previous biopsy site. I brought the wire into the wound.  Subcutaneous flaps were then raised all around the area of the wire.  A lump of tissue was then excised around the wire with care taken to keep the wire in the middle.  A specimen mammogram was performed by way of the Faxitron. The area of concern and a marking clip are contained in the specimen. The skin was anterior margin, a single suture was a superior margin, a double suture was the medial margin. It was sent to pathology.  Local anesthetic was then injected into the wound. Hemostasis was obtained with electrocautery. Once hemostasis was adequate the subcutaneous tissue was closed with interrupted 3-0 Vicryl sutures. The skin was closed with 4-0 Monocryl subcuticular stitch. Steri-Strips and sterile dressings were applied to both wounds followed by a breast binder.  She tolerated the procedures well without any apparent complications and was taken to the recovery in satisfactory condition.  BREAST LUMPECTOMY WITH SENTINEL LYMPH NODE BX   Estimated Blood Loss:  less than 100 mL         Drains: NONE          Blood Given: none          Specimens: Right breast tissue and sentinel lymph node         Implants: none        Complications:  * No complications entered in OR log *         Disposition: PACU - hemodynamically stable.  Condition: stable

## 2011-11-29 ENCOUNTER — Telehealth (INDEPENDENT_AMBULATORY_CARE_PROVIDER_SITE_OTHER): Payer: Self-pay | Admitting: General Surgery

## 2011-11-29 NOTE — Telephone Encounter (Signed)
Called pt with pathology results.  Explained that we would contact her when Dr. Abbey Chatters returns about scheduling surgery for re-excision.

## 2011-11-29 NOTE — Telephone Encounter (Signed)
Pt had br ca sx on 11/23/11 and needs a 2wk po appt, please call.

## 2011-12-03 ENCOUNTER — Ambulatory Visit (HOSPITAL_BASED_OUTPATIENT_CLINIC_OR_DEPARTMENT_OTHER): Payer: BC Managed Care – PPO | Admitting: Oncology

## 2011-12-03 ENCOUNTER — Other Ambulatory Visit (HOSPITAL_BASED_OUTPATIENT_CLINIC_OR_DEPARTMENT_OTHER): Payer: BC Managed Care – PPO | Admitting: Lab

## 2011-12-03 ENCOUNTER — Encounter: Payer: Self-pay | Admitting: *Deleted

## 2011-12-03 ENCOUNTER — Encounter: Payer: Self-pay | Admitting: Oncology

## 2011-12-03 DIAGNOSIS — D0590 Unspecified type of carcinoma in situ of unspecified breast: Secondary | ICD-10-CM

## 2011-12-03 DIAGNOSIS — C50919 Malignant neoplasm of unspecified site of unspecified female breast: Secondary | ICD-10-CM

## 2011-12-03 DIAGNOSIS — Z9889 Other specified postprocedural states: Secondary | ICD-10-CM | POA: Insufficient documentation

## 2011-12-03 HISTORY — DX: Other specified postprocedural states: Z98.890

## 2011-12-03 LAB — CBC WITH DIFFERENTIAL/PLATELET
Basophils Absolute: 0 10*3/uL (ref 0.0–0.1)
Eosinophils Absolute: 0.1 10*3/uL (ref 0.0–0.5)
HCT: 38 % (ref 34.8–46.6)
HGB: 12.6 g/dL (ref 11.6–15.9)
LYMPH%: 49.8 % — ABNORMAL HIGH (ref 14.0–49.7)
MCHC: 33.2 g/dL (ref 31.5–36.0)
MONO#: 0.5 10*3/uL (ref 0.1–0.9)
NEUT#: 2.6 10*3/uL (ref 1.5–6.5)
NEUT%: 40.9 % (ref 38.4–76.8)
Platelets: 205 10*3/uL (ref 145–400)
WBC: 6.5 10*3/uL (ref 3.9–10.3)
lymph#: 3.2 10*3/uL (ref 0.9–3.3)

## 2011-12-03 LAB — COMPREHENSIVE METABOLIC PANEL
AST: 24 U/L (ref 0–37)
Albumin: 4 g/dL (ref 3.5–5.2)
BUN: 14 mg/dL (ref 6–23)
CO2: 28 mEq/L (ref 19–32)
Calcium: 9.4 mg/dL (ref 8.4–10.5)
Chloride: 106 mEq/L (ref 96–112)
Creatinine, Ser: 0.8 mg/dL (ref 0.50–1.10)
Glucose, Bld: 103 mg/dL — ABNORMAL HIGH (ref 70–99)
Potassium: 3.9 mEq/L (ref 3.5–5.3)

## 2011-12-03 NOTE — Progress Notes (Signed)
OFFICE PROGRESS NOTE  CC  Dr. Renaye Rakers Dr. Francoise Ceo Dr. Pollyann Savoy  DIAGNOSIS: 65 year old female with DCIS grade 2 with calcifications of the right breast at the 6:00 position 8 cm from the nipple at the tumor was ER positive PR positive measuring 3.7 cm by MRI originally diagnosed in November 2012.  PRIOR THERAPY:  #1 patient is now status post right breast lumpectomy performed on 11/23/2011. The final pathology revealed a 2 residual foci of intermediate grade ductal carcinoma in situ spanning 0.3 cm and 0.1 cm. One sentinel node was negative for metastatic disease. The ductal carcinoma in situ was less than 0.1 cm away from the superior margin, other margins were negative. Tumor was ER +100% PR +100%.  #2 does of the positive superior margin patient will be scheduled for reexcision by Dr. Avel Peace sometime this month.  CURRENT THERAPY:patient to be scheduled for reexcision of the superior margin status my  INTERVAL HISTORY: Melissa Perez 65 y.o. female returns for followup visit today. Overall she is doing well her surgical scar has healed quite nicely she is without any complaints she denies having any pain in the surgical site. She still has Steri-Strips to present. She denies any nausea vomiting fevers chills night sweats headaches no shortness of breath no chest pains no palpitations no myalgias or arthralgias. Remainder of the review of systems 10 point is negative  MEDICAL HISTORY: Past Medical History  Diagnosis Date  . Arthritis     rheumatoid, take Humira  . Hypertension   . Hyperlipidemia   . Arthritis     rheumatoid  . S/P lumpectomy of breast 12/03/2011    ALLERGIES:   has no known allergies.  MEDICATIONS:  Current Outpatient Prescriptions  Medication Sig Dispense Refill  . calcium carbonate (TUMS - DOSED IN MG ELEMENTAL CALCIUM) 500 MG chewable tablet Chew 1 tablet by mouth as needed.        Marland Kitchen HYDROcodone-acetaminophen (NORCO) 5-325 MG  per tablet Take 1-2 tablets by mouth every 4 (four) hours as needed for pain.  40 tablet  1  . iron polysaccharides (NIFEREX 60) 40-20 MG capsule Take 1 capsule by mouth 3 (three) times daily with meals.        Marland Kitchen loratadine (CLARITIN) 10 MG tablet Take 10 mg by mouth daily.        . multivitamin (THERAGRAN) per tablet Take 1 tablet by mouth daily.        . valsartan-hydrochlorothiazide (DIOVAN-HCT) 80-12.5 MG per tablet Take 1 tablet by mouth daily.          SURGICAL HISTORY:  Past Surgical History  Procedure Date  . Abdominal hysterectomy   . Appendectomy   . Left breast biopsies 1991 & 1990  . Breast surgery     2 left breast biopsies-benign    REVIEW OF SYSTEMS:  Pertinent items are noted in HPI.   PHYSICAL EXAMINATION: General appearance: alert, cooperative and appears stated age Resp: clear to auscultation bilaterally and normal percussion bilaterally Back: symmetric, no curvature. ROM normal. No CVA tenderness. Cardio: regular rate and rhythm, S1, S2 normal, no murmur, click, rub or gallop and normal apical impulse GI: soft, non-tender; bowel sounds normal; no masses,  no organomegaly Extremities: extremities normal, atraumatic, no cyanosis or edema Neurologic: Alert and oriented X 3, normal strength and tone. Normal symmetric reflexes. Normal coordination and gait  ECOG PERFORMANCE STATUS: 0 - Asymptomatic  Blood pressure 152/75, pulse 74, temperature 98.7 F (37.1 C), temperature source Oral,  height 5\' 1"  (1.549 m), weight 165 lb 9.6 oz (75.116 kg).  LABORATORY DATA: Lab Results  Component Value Date   WBC 6.5 12/03/2011   HGB 12.6 12/03/2011   HCT 38.0 12/03/2011   MCV 90.0 12/03/2011   PLT 205 12/03/2011      Chemistry      Component Value Date/Time   NA 142 11/21/2011 1557   K 3.4* 11/21/2011 1557   CL 103 11/21/2011 1557   CO2 27 11/21/2011 1557   BUN 13 11/21/2011 1557   CREATININE 0.71 11/21/2011 1557      Component Value Date/Time   CALCIUM 10.2  11/21/2011 1557   ALKPHOS 115 11/21/2011 1557   AST 32 11/21/2011 1557   ALT 34 11/21/2011 1557   BILITOT 0.4 11/21/2011 1557       RADIOGRAPHIC STUDIES: none  ASSESSMENT: 65 year old female with 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 The final pathology revealed pure DCIS 2 foci one measuring 0.3 the other one measuring 0.1 cm. It are mediate grade, however superior margin was positive. A reexcision is planned by Dr. Abbey Chatters. The patient and I did discuss be 2 study patient is not interested in this. I will therefore refer her to Dr. [name] radiation after patient has had a reexcision.   PLAN: patient to have a reexcision of the superior margin sometime this month. She will then be seen by Dr. [name] for post lumpectomy radiation for her DCIS. Once she completes the radiation I will see her back and we will discuss chemoprevention with antiestrogen therapy such as Aromasin.   All questions were answered. The patient knows to call the clinic with any problems, questions or concerns. We can certainly see the patient much sooner if necessary.  I spent 20 minutes counseling the patient face to face. The total time spent in the appointment was 30 minutes.    [name], MD Medical/Oncology [facility] [phone] (beeper) [phone] (Office)  12/03/2011, 11:30 AM

## 2011-12-05 ENCOUNTER — Telehealth: Payer: Self-pay | Admitting: *Deleted

## 2011-12-10 ENCOUNTER — Telehealth (INDEPENDENT_AMBULATORY_CARE_PROVIDER_SITE_OTHER): Payer: Self-pay

## 2011-12-11 NOTE — Telephone Encounter (Signed)
Close encounter 

## 2011-12-12 ENCOUNTER — Encounter (HOSPITAL_BASED_OUTPATIENT_CLINIC_OR_DEPARTMENT_OTHER): Payer: Self-pay | Admitting: *Deleted

## 2011-12-12 ENCOUNTER — Ambulatory Visit: Payer: BC Managed Care – PPO | Admitting: Radiation Oncology

## 2011-12-12 ENCOUNTER — Encounter (INDEPENDENT_AMBULATORY_CARE_PROVIDER_SITE_OTHER): Payer: Self-pay | Admitting: General Surgery

## 2011-12-12 ENCOUNTER — Ambulatory Visit: Payer: BC Managed Care – PPO

## 2011-12-12 ENCOUNTER — Ambulatory Visit (INDEPENDENT_AMBULATORY_CARE_PROVIDER_SITE_OTHER): Payer: BC Managed Care – PPO | Admitting: General Surgery

## 2011-12-12 VITALS — BP 150/82 | HR 80 | Temp 98.0°F | Ht 61.0 in | Wt 163.8 lb

## 2011-12-12 DIAGNOSIS — Z9889 Other specified postprocedural states: Secondary | ICD-10-CM

## 2011-12-12 NOTE — Progress Notes (Signed)
Pt here 12/12-had to have re-excision breast

## 2011-12-12 NOTE — Patient Instructions (Signed)
Would like you to have your surgery after you have been off your Humira for a week.

## 2011-12-12 NOTE — Progress Notes (Signed)
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  SLN negative.  HPI:  She is here for her first postoperative visit. She saw Dr. Welton Flakes about 10 days ago.  She had a dysphoric reaction to her hydrocodone. Other than that she has been doing well. She is aware of her pathology.   Physical Exam: Right breast inferior incision is clean and intact. Right axillary incision is clean and intact.  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  The procedure and risks were discussed with her. Risks include but are not limited to bleeding, infection, wound healing problems, anesthesia.

## 2011-12-14 ENCOUNTER — Ambulatory Visit (HOSPITAL_BASED_OUTPATIENT_CLINIC_OR_DEPARTMENT_OTHER)
Admission: RE | Admit: 2011-12-14 | Discharge: 2011-12-14 | Disposition: A | Discharge status: Home / Self Care | Payer: BC Managed Care – PPO | Source: Ambulatory Visit | Attending: General Surgery | Admitting: General Surgery

## 2011-12-14 ENCOUNTER — Encounter (HOSPITAL_BASED_OUTPATIENT_CLINIC_OR_DEPARTMENT_OTHER): Payer: Self-pay | Admitting: Certified Registered Nurse Anesthetist

## 2011-12-14 ENCOUNTER — Other Ambulatory Visit (INDEPENDENT_AMBULATORY_CARE_PROVIDER_SITE_OTHER): Payer: Self-pay | Admitting: General Surgery

## 2011-12-14 ENCOUNTER — Encounter (HOSPITAL_BASED_OUTPATIENT_CLINIC_OR_DEPARTMENT_OTHER): Payer: Self-pay

## 2011-12-14 ENCOUNTER — Encounter (HOSPITAL_BASED_OUTPATIENT_CLINIC_OR_DEPARTMENT_OTHER)
Admission: RE | Disposition: A | Discharge status: Home / Self Care | Payer: Self-pay | Source: Ambulatory Visit | Attending: General Surgery

## 2011-12-14 ENCOUNTER — Ambulatory Visit (HOSPITAL_BASED_OUTPATIENT_CLINIC_OR_DEPARTMENT_OTHER): Payer: BC Managed Care – PPO | Admitting: Certified Registered Nurse Anesthetist

## 2011-12-14 DIAGNOSIS — C50919 Malignant neoplasm of unspecified site of unspecified female breast: Secondary | ICD-10-CM

## 2011-12-14 DIAGNOSIS — I10 Essential (primary) hypertension: Secondary | ICD-10-CM | POA: Insufficient documentation

## 2011-12-14 DIAGNOSIS — D0590 Unspecified type of carcinoma in situ of unspecified breast: Secondary | ICD-10-CM | POA: Insufficient documentation

## 2011-12-14 LAB — POCT I-STAT, CHEM 8
Calcium, Ion: 1.27 mmol/L (ref 1.12–1.32)
Chloride: 106 mEq/L (ref 96–112)
Glucose, Bld: 86 mg/dL (ref 70–99)
HCT: 44 % (ref 36.0–46.0)
Hemoglobin: 15 g/dL (ref 12.0–15.0)
TCO2: 28 mmol/L (ref 0–100)

## 2011-12-14 SURGERY — EXCISION, LESION, BREAST
Anesthesia: General | Site: Breast | Laterality: Right | Wound class: Clean

## 2011-12-14 MED ORDER — PROPOFOL 10 MG/ML IV EMUL
INTRAVENOUS | Status: DC | PRN
  Administered 2011-12-14: 200 mg via INTRAVENOUS

## 2011-12-14 MED ORDER — PROMETHAZINE HCL 25 MG/ML IJ SOLN: 12.5000 mg | Freq: Four times a day (QID) | INTRAMUSCULAR | Status: DC | PRN

## 2011-12-14 MED ORDER — OXYCODONE HCL 5 MG PO TABS
5.0000 mg | ORAL_TABLET | ORAL | Status: DC | PRN
  Administered 2011-12-14: 5 mg via ORAL

## 2011-12-14 MED ORDER — ONDANSETRON HCL 4 MG/2ML IJ SOLN: 4.0000 mg | Freq: Four times a day (QID) | INTRAMUSCULAR | Status: DC | PRN

## 2011-12-14 MED ORDER — FENTANYL CITRATE 0.05 MG/ML IJ SOLN
INTRAMUSCULAR | Status: DC | PRN
  Administered 2011-12-14 (×3): 50 ug via INTRAVENOUS

## 2011-12-14 MED ORDER — LIDOCAINE HCL (CARDIAC) 20 MG/ML IV SOLN
INTRAVENOUS | Status: DC | PRN
  Administered 2011-12-14: 60 mg via INTRAVENOUS

## 2011-12-14 MED ORDER — MIDAZOLAM HCL 2 MG/2ML IJ SOLN: 0.5000 mg | INTRAMUSCULAR | Status: DC | PRN

## 2011-12-14 MED ORDER — OXYCODONE-ACETAMINOPHEN 5-325 MG PO TABS: 1.0000 | ORAL_TABLET | ORAL | Status: AC | PRN

## 2011-12-14 MED ORDER — SODIUM CHLORIDE 0.9 % IJ SOLN: 3.0000 mL | INTRAMUSCULAR | Status: DC | PRN

## 2011-12-14 MED ORDER — CEFAZOLIN SODIUM 1-5 GM-% IV SOLN: 1.0000 g | INTRAVENOUS | Status: DC

## 2011-12-14 MED ORDER — DEXAMETHASONE SODIUM PHOSPHATE 4 MG/ML IJ SOLN
INTRAMUSCULAR | Status: DC | PRN
  Administered 2011-12-14: 10 mg via INTRAVENOUS

## 2011-12-14 MED ORDER — MORPHINE SULFATE 2 MG/ML IJ SOLN: 0.0500 mg/kg | INTRAMUSCULAR | Status: DC | PRN

## 2011-12-14 MED ORDER — MIDAZOLAM HCL 5 MG/5ML IJ SOLN
INTRAMUSCULAR | Status: DC | PRN
  Administered 2011-12-14: 1 mg via INTRAVENOUS

## 2011-12-14 MED ORDER — METOCLOPRAMIDE HCL 5 MG/ML IJ SOLN: 10.0000 mg | Freq: Once | INTRAMUSCULAR | Status: DC | PRN

## 2011-12-14 MED ORDER — FENTANYL CITRATE 0.05 MG/ML IJ SOLN
25.0000 ug | INTRAMUSCULAR | Status: DC | PRN
  Administered 2011-12-14: 25 ug via INTRAVENOUS

## 2011-12-14 MED ORDER — ACETAMINOPHEN 650 MG RE SUPP: 650.0000 mg | RECTAL | Status: DC | PRN

## 2011-12-14 MED ORDER — ACETAMINOPHEN 10 MG/ML IV SOLN
1000.0000 mg | Freq: Once | INTRAVENOUS | Status: AC
  Administered 2011-12-14: 1000 mg via INTRAVENOUS

## 2011-12-14 MED ORDER — BUPIVACAINE HCL (PF) 0.5 % IJ SOLN
INTRAMUSCULAR | Status: DC | PRN
  Administered 2011-12-14: 10 mL

## 2011-12-14 MED ORDER — METOCLOPRAMIDE HCL 5 MG/ML IJ SOLN
INTRAMUSCULAR | Status: DC | PRN
  Administered 2011-12-14: 10 mg via INTRAVENOUS

## 2011-12-14 MED ORDER — ACETAMINOPHEN 325 MG PO TABS: 650.0000 mg | ORAL_TABLET | ORAL | Status: DC | PRN

## 2011-12-14 MED ORDER — MORPHINE SULFATE 2 MG/ML IJ SOLN: 2.0000 mg | INTRAMUSCULAR | Status: DC | PRN

## 2011-12-14 MED ORDER — LACTATED RINGERS IV SOLN
INTRAVENOUS | Status: DC
  Administered 2011-12-14 (×2): via INTRAVENOUS

## 2011-12-14 SURGICAL SUPPLY — 40 items
BANDAGE ELASTIC 6 VELCRO ST LF (GAUZE/BANDAGES/DRESSINGS) ×2 IMPLANT
BENZOIN TINCTURE PRP APPL 2/3 (GAUZE/BANDAGES/DRESSINGS) ×2 IMPLANT
BINDER BREAST LRG (GAUZE/BANDAGES/DRESSINGS) ×2 IMPLANT
BLADE SURG 10 STRL SS (BLADE) ×2 IMPLANT
CANISTER SUCTION 1200CC (MISCELLANEOUS) ×2 IMPLANT
CHLORAPREP W/TINT 26ML (MISCELLANEOUS) ×2 IMPLANT
CLOTH BEACON ORANGE TIMEOUT ST (SAFETY) ×2 IMPLANT
COVER MAYO STAND STRL (DRAPES) ×2 IMPLANT
COVER TABLE BACK 60X90 (DRAPES) ×2 IMPLANT
DECANTER SPIKE VIAL GLASS SM (MISCELLANEOUS) IMPLANT
DEVICE DUBIN W/COMP PLATE 8390 (MISCELLANEOUS) IMPLANT
DRAPE PED LAPAROTOMY (DRAPES) ×2 IMPLANT
DRAPE UTILITY XL STRL (DRAPES) ×2 IMPLANT
ELECT COATED BLADE 2.86 ST (ELECTRODE) ×2 IMPLANT
ELECT REM PT RETURN 9FT ADLT (ELECTROSURGICAL) ×2
ELECTRODE REM PT RTRN 9FT ADLT (ELECTROSURGICAL) ×1 IMPLANT
GAUZE SPONGE 4X4 12PLY STRL LF (GAUZE/BANDAGES/DRESSINGS) ×2 IMPLANT
GLOVE BIOGEL PI IND STRL 8.5 (GLOVE) ×1 IMPLANT
GLOVE BIOGEL PI INDICATOR 8.5 (GLOVE) ×1
GLOVE ECLIPSE 8.0 STRL XLNG CF (GLOVE) ×2 IMPLANT
GOWN PREVENTION PLUS XLARGE (GOWN DISPOSABLE) ×4 IMPLANT
NEEDLE HYPO 25X1 1.5 SAFETY (NEEDLE) ×2 IMPLANT
NS IRRIG 1000ML POUR BTL (IV SOLUTION) ×2 IMPLANT
PACK BASIN DAY SURGERY FS (CUSTOM PROCEDURE TRAY) ×2 IMPLANT
PENCIL BUTTON HOLSTER BLD 10FT (ELECTRODE) ×2 IMPLANT
SLEEVE SCD COMPRESS KNEE MED (MISCELLANEOUS) ×2 IMPLANT
SPONGE GAUZE 4X4 12PLY (GAUZE/BANDAGES/DRESSINGS) ×2 IMPLANT
STRIP CLOSURE SKIN 1/2X4 (GAUZE/BANDAGES/DRESSINGS) ×2 IMPLANT
SUT MON AB 4-0 PC3 18 (SUTURE) ×2 IMPLANT
SUT SILK 3 0 PS 1 (SUTURE) ×2 IMPLANT
SUT VIC AB 3-0 SH 27 (SUTURE)
SUT VIC AB 3-0 SH 27X BRD (SUTURE) IMPLANT
SUT VICRYL 3-0 CR8 SH (SUTURE) ×2 IMPLANT
SYR CONTROL 10ML LL (SYRINGE) ×2 IMPLANT
TAPE CLOTH SURG 4X10 WHT LF (GAUZE/BANDAGES/DRESSINGS) ×2 IMPLANT
TOWEL OR 17X24 6PK STRL BLUE (TOWEL DISPOSABLE) ×2 IMPLANT
TOWEL OR NON WOVEN STRL DISP B (DISPOSABLE) ×2 IMPLANT
TUBE CONNECTING 20X1/4 (TUBING) ×2 IMPLANT
WATER STERILE IRR 1000ML POUR (IV SOLUTION) IMPLANT
YANKAUER SUCT BULB TIP NO VENT (SUCTIONS) ×2 IMPLANT

## 2011-12-14 NOTE — Anesthesia Postprocedure Evaluation (Signed)
Anesthesia Post Note  Patient: Melissa Perez  Procedure(s) Performed:  RE-EXCISION OF BREAST LUMPECTOMY - re excision of right breast lumpectomy site   Anesthesia type: General  Patient location: PACU  Post pain: Pain level controlled  Post assessment: Patient's Cardiovascular Status Stable  Last Vitals:  Filed Vitals:   12/14/11 1702  BP: 159/75  Pulse: 74  Temp:   Resp: 15    Post vital signs: Reviewed and stable  Level of consciousness: alert  Complications: No apparent anesthesia complications

## 2011-12-14 NOTE — Anesthesia Procedure Notes (Signed)
Procedure Name: LMA Insertion Date/Time: 12/14/2011 3:51 PM Performed by: Mcclain Shall D Pre-anesthesia Checklist: Patient identified, Emergency Drugs available, Suction available and Patient being monitored Patient Re-evaluated:Patient Re-evaluated prior to inductionOxygen Delivery Method: Circle System Utilized Preoxygenation: Pre-oxygenation with 100% oxygen Intubation Type: IV induction Ventilation: Mask ventilation without difficulty LMA: LMA inserted LMA Size: 4.0 Number of attempts: 1 Placement Confirmation: positive ETCO2 Tube secured with: Tape Dental Injury: Teeth and Oropharynx as per pre-operative assessment

## 2011-12-14 NOTE — Interval H&P Note (Signed)
History and Physical Interval Note:  12/14/2011 3:44 PM  Melissa Perez  has presented today for surgery, with the diagnosis of DCIS right breast   The various methods of treatment have been discussed with the patient and family. After consideration of risks, benefits and other options for treatment, the patient has consented to  Procedure(s): RE-EXCISION OF BREAST LUMPECTOMY as a surgical intervention .  The patients' history has been reviewed, patient examined, no change in status, stable for surgery.  I have reviewed the patients' chart and labs.  Questions were answered to the patient's satisfaction.     Tyrail Grandfield Shela Commons

## 2011-12-14 NOTE — H&P (Signed)
Melissa Perez is an 66 y.o. female.   Chief Complaint: Here for re-excision of right partial mastectomy site. HPI:   66 year old female s/p right partial mastectomy for DCIS.  The superior margin demonstrates tumor cells < 1mm from the margin.  She is here for re-excision of that margin.  Past Medical History  Diagnosis Date  . Arthritis     rheumatoid, take Humira  . Hypertension   . Hyperlipidemia   . Arthritis     rheumatoid  . S/P lumpectomy of breast 12/03/2011  . Cancer   . Arthritis pain   . Rash     Past Surgical History  Procedure Date  . Abdominal hysterectomy   . Appendectomy   . Left breast biopsies 1991 & 1990  . Breast surgery     2 left breast biopsies-benign  . Mastectomy partial / lumpectomy 12/12    Family History  Problem Relation Age of Onset  . Breast cancer Mother   . Cancer Mother   . Kidney cancer Father   . Cancer Father   . Breast cancer Sister   . Cancer Sister   . Lung cancer Sister   . Colon cancer Maternal Uncle    Social History:  reports that she quit smoking about 8 years ago. She does not have any smokeless tobacco history on file. She reports that she drinks about 1.8 ounces of alcohol per week. She reports that she does not use illicit drugs.  Allergies:  Allergies  Allergen Reactions  . Dilaudid (Hydromorphone Hcl) Nausea And Vomiting  . Hydrocodone Nausea And Vomiting    Medications Prior to Admission  Medication Dose Route Frequency Provider Last Rate Last Dose  . acetaminophen (OFIRMEV) IV 1,000 mg  1,000 mg Intravenous Once Constance Goltz, MD   1,000 mg at 12/14/11 1451  . ceFAZolin (ANCEF) IVPB 1 g/50 mL premix  1 g Intravenous 60 min Pre-Op Adolph Pollack, MD      . lactated ringers infusion   Intravenous Continuous Germaine Pomfret, MD 10 mL/hr at 12/14/11 1451    . midazolam (VERSED) injection 0.5-2 mg  0.5-2 mg Intravenous PRN Constance Goltz, MD       Medications Prior to Admission    Medication Sig Dispense Refill  . calcium carbonate (TUMS - DOSED IN MG ELEMENTAL CALCIUM) 500 MG chewable tablet Chew 1 tablet by mouth as needed.        . iron polysaccharides (NIFEREX 60) 40-20 MG capsule Take 1 capsule by mouth 3 (three) times daily with meals.        Marland Kitchen loratadine (CLARITIN) 10 MG tablet Take 10 mg by mouth daily.        . multivitamin (THERAGRAN) per tablet Take 1 tablet by mouth daily.        . valsartan-hydrochlorothiazide (DIOVAN-HCT) 80-12.5 MG per tablet Take 1 tablet by mouth daily.          Results for orders placed during the hospital encounter of 12/14/11 (from the past 48 hour(s))  POCT I-STAT, CHEM 8     Status: Abnormal   Collection Time   12/14/11  2:28 PM      Component Value Range Comment   Sodium 144  135 - 145 (mEq/L)    Potassium 3.4 (*) 3.5 - 5.1 (mEq/L)    Chloride 106  96 - 112 (mEq/L)    BUN 15  6 - 23 (mg/dL)    Creatinine, Ser 4.25  0.50 - 1.10 (  mg/dL)    Glucose, Bld 86  70 - 99 (mg/dL)    Calcium, Ion 1.61  1.12 - 1.32 (mmol/L)    TCO2 28  0 - 100 (mmol/L)    Hemoglobin 15.0  12.0 - 15.0 (g/dL)    HCT 09.6  04.5 - 40.9 (%)    No results found.  Review of Systems  Constitutional: Negative for fever and chills.  Gastrointestinal: Negative for nausea and diarrhea.    Blood pressure 136/78, pulse 79, temperature 97.9 F (36.6 C), temperature source Oral, resp. rate 16, SpO2 98.00%. Physical Exam  Constitutional: She appears well-developed and well-nourished.  Cardiovascular: Normal rate and regular rhythm.   Respiratory: Effort normal and breath sounds normal.       Right inferior breast incision is c/d/i.  GI: Soft. There is no tenderness.     Assessment/Plan Right breast DCIS with close/positive superior lumpectomy margin.  Plan:  Re-excision of right breast lumpectomy site.  Procedure, risks, and aftercare were discussed with her.  Vegas Coffin J 12/14/2011, 3:41 PM

## 2011-12-14 NOTE — Op Note (Signed)
Operative Note  Melissa Perez female 66 y.o. 12/14/2011  PREOPERATIVE DX:  DCIS Right breast with close/positive superior margin postlumpectomy POSTOPERATIVE DX:  Same  PROCEDURE:  Reexcision of right breast lumpectomy site         Surgeon: Adolph Pollack   Assistants: None  Anesthesia: General LMA anesthesia  Indications:   Ms. Flicker is a 66 year old female who underwent a right breast lumpectomy for DCIS. Tumor cells were less than 1 mm away from the superior margin. She now presents for reexcision.    Procedure Detail:  She was seen in the holding area and the right breast mark with my initials. She is brought to the operative room placed supine on the operating table and a general anesthetic was administered. The right breast was sterilely prepped and draped. The inferior right breast incision was reopened sharply and a seroma was drained. I then used electrocautery and excised more superior tissue at the previous lumpectomy site. I marked this with sutures to orient it for the pathologist. This was then sent to pathology.  The wound was inspected and irrigated. Bleeding points were controlled with electrocautery. At this point hemostasis was adequate. The subcutaneous tissue the wound is then closed with interrupted 3-0 Vicryl sutures. The skin was closed with a running 4-0 Monocryl subcuticular stitch. Steri-Strips and sterile dressings were applied.  A breast binder was then applied.  She tolerated the procedure well without any apparent complications and was taken to the recovery room in satisfactory condition.      Estimated Blood Loss:  less than 50 mL         Drains: none          Blood Given: none          Specimens: Right breast tissue        Complications:  * No complications entered in OR log *         Disposition: PACU - hemodynamically stable.         Condition: stable

## 2011-12-14 NOTE — Transfer of Care (Signed)
Immediate Anesthesia Transfer of Care Note  Patient: Melissa Perez  Procedure(s) Performed:  RE-EXCISION OF BREAST LUMPECTOMY - re excision of right breast lumpectomy site   Patient Location: PACU  Anesthesia Type: General  Level of Consciousness: awake, alert , oriented and patient cooperative  Airway & Oxygen Therapy: Patient Spontanous Breathing and Patient connected to face mask oxygen  Post-op Assessment: Report given to PACU RN and Post -op Vital signs reviewed and stable  Post vital signs: Reviewed and stable Filed Vitals:   12/14/11 1408  BP: 136/78  Pulse: 79  Temp: 36.6 C  Resp: 16    Complications: No apparent anesthesia complications

## 2011-12-14 NOTE — Anesthesia Preprocedure Evaluation (Addendum)
Anesthesia Evaluation  Patient identified by MRN, date of birth, ID band Patient awake    Reviewed: Allergy & Precautions, H&P , NPO status , Patient's Chart, lab work & pertinent test results, reviewed documented beta blocker date and time   Airway Mallampati: II TM Distance: >3 FB Neck ROM: full    Dental   Pulmonary neg pulmonary ROS,          Cardiovascular hypertension, On Medications     Neuro/Psych Negative Neurological ROS  Negative Psych ROS   GI/Hepatic negative GI ROS, Neg liver ROS,   Endo/Other  Negative Endocrine ROS  Renal/GU negative Renal ROS  Genitourinary negative   Musculoskeletal   Abdominal   Peds  Hematology negative hematology ROS (+)   Anesthesia Other Findings See surgeon's H&P   Reproductive/Obstetrics negative OB ROS                          Anesthesia Physical Anesthesia Plan  ASA: II  Anesthesia Plan: General   Post-op Pain Management:    Induction: Intravenous  Airway Management Planned: LMA  Additional Equipment:   Intra-op Plan:   Post-operative Plan: Extubation in OR  Informed Consent: I have reviewed the patients History and Physical, chart, labs and discussed the procedure including the risks, benefits and alternatives for the proposed anesthesia with the patient or authorized representative who has indicated his/her understanding and acceptance.     Plan Discussed with: CRNA and Surgeon  Anesthesia Plan Comments:        Anesthesia Quick Evaluation

## 2011-12-18 ENCOUNTER — Telehealth (INDEPENDENT_AMBULATORY_CARE_PROVIDER_SITE_OTHER): Payer: Self-pay

## 2011-12-18 NOTE — Telephone Encounter (Signed)
Called pt and gave her benign pathology results from breast reexcision.

## 2011-12-26 DIAGNOSIS — C801 Malignant (primary) neoplasm, unspecified: Secondary | ICD-10-CM | POA: Insufficient documentation

## 2011-12-27 ENCOUNTER — Ambulatory Visit
Admission: RE | Admit: 2011-12-27 | Discharge: 2011-12-27 | Disposition: A | Discharge status: Home / Self Care | Payer: BC Managed Care – PPO | Source: Ambulatory Visit | Attending: Radiation Oncology | Admitting: Radiation Oncology

## 2011-12-27 ENCOUNTER — Encounter: Payer: Self-pay | Admitting: Radiation Oncology

## 2011-12-27 DIAGNOSIS — C50519 Malignant neoplasm of lower-outer quadrant of unspecified female breast: Secondary | ICD-10-CM

## 2011-12-27 HISTORY — DX: Malignant neoplasm of unspecified site of unspecified female breast: C50.919

## 2011-12-27 NOTE — Progress Notes (Signed)
Please see the Nurse Progress Note in the MD Initial Consult Encounter for this patient. 

## 2011-12-27 NOTE — Progress Notes (Signed)
CC:   Adolph Pollack, M.D. Renaye Rakers, M.D. [name], M.D. [name], M.D.  DIAGNOSIS:  Right breast ductal carcinoma in situ.  PREVIOUS INTERVENTIONS: 1. Lumpectomy on 11/23/2011 revealing 2 foci of intermediate grade     ductal carcinoma in situ measuring 0.3 and 0.1 cm with a close     superior margin. 2. Re-excision on 12/14/2011 revealing no in situ or invasive     carcinoma.  INTERVAL HISTORY:  Ms. Galka reports for followup today.  She has healed well from her surgery.  She has no breast-related complaints.  She is considering going to [employer] and would like to delay her treatments until she returns from that.  She is not taking any pain medications. She had questions about hypofractionated treatment.  PHYSICAL EXAMINATION:  She is a pleasant female in no distress.  Sitting comfortably on exam table.  She is alert and oriented x3.  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  RECOMMENDATIONS:  Akshara looks great.  I think we will do her planning session the 1st week of February and then start as soon as she returns from [facility].  We discussed the possible side effects of treatment including but not limited to skin changes and fatigue.  She is struggling with some fatigue already and thinks that might be related to her blood pressure medication when she is going to discuss with her primary care physician.  She has signed informed consent and agreed to proceed forward.    ______________________________ [name], M.D. SW/MEDQ  D:  12/27/2011  T:  12/27/2011  Job:  [id number]

## 2011-12-27 NOTE — Progress Notes (Signed)
Pt. here for evaluation for radiation treatment.Seen in breast clinic. Will not require intravenous chemotherapy. Menses age 66 First and only child at age 9;boy/vaginal birth No HRT

## 2012-01-01 ENCOUNTER — Encounter: Payer: Self-pay | Admitting: *Deleted

## 2012-01-03 ENCOUNTER — Telehealth: Payer: Self-pay | Admitting: Radiation Oncology

## 2012-01-03 NOTE — Telephone Encounter (Signed)
Received call from patient today requesting to speak with Dr. Michell Heinrich. 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 Patient reports she feels as though her lungs have been compromised related to this exposure. Patient wants Dr. Michell Heinrich to tell her if it is "resonable for her to receive a smaller dosage of radiation to avoid further damage to her lungs and ribs." Informed patient this writer would relay this question to Dr. Michell Heinrich. Instructed patient Dr. Michell Heinrich or this writer would phone her back at [phone] or [phone] before the day was up. Patient verbalized understanding.

## 2012-01-03 NOTE — Telephone Encounter (Signed)
Phoned patient at home as directed by Dr. Michell Heinrich. Informed patient that radiation will not affect her lung function regardless of her exposure to Tb. Educated the patient that the dose of radiation that is necessary for breast cancer is 60 units and cannot be compromised or shortened. Assured her that Dr. Michell Heinrich will expose a very small amount of her lung to radiation and it will not affect her breath whatsoever. Offered a pulmonologist referral prior to treatment to discuss her baseline lung function. All these things were told to the patient per Dr. Luciano Cutter order/directions. Patient verbalized understanding and denied need for Dr. Michell Heinrich to phone. Reminded patient of 01/08/2012 0900 CT/SIM. Again, patient verbalized understanding.

## 2012-01-03 NOTE — Telephone Encounter (Deleted)
Received call from patient today requesting to speak with Dr. Wentworth. 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 Patient reports she feels as though her lungs have been compromised related to this exposure. Patient wants Dr. Wentworth to tell her if it is "resonable for her to receive a smaller dosage of radiation to avoid further damage to her lungs and ribs." Informed patient this writer would relay this question to Dr. Wentworth. Instructed patient Dr. Wentworth or this writer would phone her back at [phone] or [phone] before the day was up. Patient verbalized understanding.  

## 2012-01-08 ENCOUNTER — Ambulatory Visit
Admission: RE | Admit: 2012-01-08 | Discharge: 2012-01-08 | Disposition: A | Discharge status: Home / Self Care | Payer: BC Managed Care – PPO | Source: Ambulatory Visit | Attending: Radiation Oncology | Admitting: Radiation Oncology

## 2012-01-08 DIAGNOSIS — C50519 Malignant neoplasm of lower-outer quadrant of unspecified female breast: Secondary | ICD-10-CM

## 2012-01-08 NOTE — Progress Notes (Signed)
Name: Melissa Perez   ZOX:.[id number]  Date:  01/08/2012  DOB: 1946/03/03  Status:outpatient    DIAGNOSIS: Breast cancer.  CONSENT VERIFIED: yes   SET UP: Patient is setup supine   IMMOBILIZATION:  The following immobilization was used:Custom Moldable Pillow, breast board.   NARRATIVE: [name] was brought to the CT Simulation planning suite.  She has declined enrollment in the [facility] skin study.  Identity was confirmed.  All relevant records and images related to the planned course of therapy were reviewed.  Then, the patient was positioned in a stable reproducible clinical set-up for radiation therapy.  Wires were placed to delineate the clinical extent of breast tissue. A wire was placed on the scar as well.  CT images were obtained.  An isocenter was placed. Skin markings were placed.  The CT images were loaded into the planning software where the target and avoidance structures were contoured.  The radiation prescription was entered and confirmed. The patient was discharged in stable condition and tolerated simulation well.    TREATMENT PLANNING NOTE:  Treatment planning then occurred. I have requested : MLC's, isodose plan, basic dose calculation

## 2012-01-08 NOTE — Progress Notes (Signed)
Met with patient to discuss RO billing.    Dx: V60.89 POST-PROC STATES NEC   Attending Rad: Dr. Iona Hansen Tx: [id number] Extrl Beam

## 2012-01-09 ENCOUNTER — Ambulatory Visit
Admission: RE | Admit: 2012-01-09 | Discharge: 2012-01-09 | Disposition: A | Discharge status: Home / Self Care | Payer: BC Managed Care – PPO | Source: Ambulatory Visit | Attending: Rheumatology | Admitting: Rheumatology

## 2012-01-09 ENCOUNTER — Other Ambulatory Visit: Payer: Self-pay | Admitting: Rheumatology

## 2012-01-09 DIAGNOSIS — Z9289 Personal history of other medical treatment: Secondary | ICD-10-CM

## 2012-01-10 ENCOUNTER — Other Ambulatory Visit: Payer: BC Managed Care – PPO | Admitting: Lab

## 2012-01-11 ENCOUNTER — Ambulatory Visit (HOSPITAL_BASED_OUTPATIENT_CLINIC_OR_DEPARTMENT_OTHER): Payer: BC Managed Care – PPO | Admitting: Oncology

## 2012-01-11 ENCOUNTER — Telehealth: Payer: Self-pay | Admitting: *Deleted

## 2012-01-11 ENCOUNTER — Other Ambulatory Visit: Payer: Self-pay | Admitting: *Deleted

## 2012-01-11 ENCOUNTER — Other Ambulatory Visit: Payer: BC Managed Care – PPO | Admitting: Lab

## 2012-01-11 ENCOUNTER — Encounter: Payer: Self-pay | Admitting: Oncology

## 2012-01-11 DIAGNOSIS — D0590 Unspecified type of carcinoma in situ of unspecified breast: Secondary | ICD-10-CM

## 2012-01-11 DIAGNOSIS — C50919 Malignant neoplasm of unspecified site of unspecified female breast: Secondary | ICD-10-CM

## 2012-01-11 LAB — CBC WITH DIFFERENTIAL/PLATELET
BASO%: 0.8 % (ref 0.0–2.0)
Basophils Absolute: 0.1 10*3/uL (ref 0.0–0.1)
EOS%: 1.9 % (ref 0.0–7.0)
HGB: 13.2 g/dL (ref 11.6–15.9)
MCH: 29.4 pg (ref 25.1–34.0)
MCHC: 32.8 g/dL (ref 31.5–36.0)
MCV: 89.8 fL (ref 79.5–101.0)
MONO%: 4.8 % (ref 0.0–14.0)
RBC: 4.49 10*6/uL (ref 3.70–5.45)
RDW: 13.9 % (ref 11.2–14.5)

## 2012-01-11 LAB — COMPREHENSIVE METABOLIC PANEL
ALT: 26 U/L (ref 0–35)
AST: 31 U/L (ref 0–37)
Albumin: 4.4 g/dL (ref 3.5–5.2)
Alkaline Phosphatase: 102 U/L (ref 39–117)
Calcium: 9.9 mg/dL (ref 8.4–10.5)
Chloride: 101 mEq/L (ref 96–112)
Potassium: 4.1 mEq/L (ref 3.5–5.3)
Sodium: 139 mEq/L (ref 135–145)
Total Protein: 6.7 g/dL (ref 6.0–8.3)

## 2012-01-11 NOTE — Telephone Encounter (Signed)
gave patient appointment for 03-25-2012 starting at 8:30am with labs

## 2012-01-12 NOTE — Progress Notes (Signed)
OFFICE PROGRESS NOTE  CC  Dr. Renaye Rakers Dr. Francoise Ceo Dr. Pollyann Savoy Dr. Avel Peace Dr. 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  PRIOR THERAPY:  #1 patient is now status post right breast lumpectomy performed on 11/23/2011. The final pathology revealed a 2 residual foci of intermediate grade ductal carcinoma in situ spanning 0.3 cm and 0.1 cm. One sentinel node was negative for metastatic disease. The ductal carcinoma in situ was less than 0.1 cm away from the superior margin, other margins were negative. Tumor was ER +100% PR +100%. She is S/P re-excision.  #2 S/P simulation for  Radiation Therapy to the right breast to begin next week.  CURRENT THERAPY: To begin radiation therapy next week  INTERVAL HISTORY: Melissa Perez 65 y.o. female returns for followup visit today. She has had re-excision and is doing well. She has been seen by Dr. Michell Heinrich for radiation and has been simulated. She is going to start sometime next week. Overall she is doing well. She denies any pain, fevers, bleeding. Remainder of the 10 point review of systems is negative. MEDICAL HISTORY: Past Medical History  Diagnosis Date  . Hypertension   . Hyperlipidemia   . S/P lumpectomy of breast 12/03/2011  . Cancer   . Arthritis pain   . Rash   . Breast cancer   . Arthritis     rheumatoid, take Humira  . Arthritis     rheumatoid    ALLERGIES:  is allergic to dilaudid and hydrocodone.  MEDICATIONS:  Current Outpatient Prescriptions  Medication Sig Dispense Refill  . calcium carbonate (TUMS - DOSED IN MG ELEMENTAL CALCIUM) 500 MG chewable tablet Chew 1 tablet by mouth as needed.        . folic acid (FOLVITE) 1 MG tablet Take 1 mg by mouth 2 (two) times daily.      . iron  polysaccharides (NIFEREX 60) 40-20 MG capsule Take 1 capsule by mouth 3 (three) times daily with meals.        Marland Kitchen loratadine (CLARITIN) 10 MG tablet Take 10 mg by mouth daily.        . methotrexate (RHEUMATREX) 2.5 MG tablet Take by mouth. 8 tablets once a week      . multivitamin (THERAGRAN) per tablet Take 1 tablet by mouth daily.        . valsartan-hydrochlorothiazide (DIOVAN-HCT) 80-12.5 MG per tablet Take 1 tablet by mouth daily.          SURGICAL HISTORY:  Past Surgical History  Procedure Date  . Abdominal hysterectomy   . Appendectomy   . Left breast biopsies 1991 & 1990  . Breast surgery     2 left breast biopsies-benign  . Mastectomy partial / lumpectomy 12/12    REVIEW OF SYSTEMS:  Pertinent items are noted in HPI.   PHYSICAL EXAMINATION: General appearance: alert, cooperative and appears stated age Resp: clear to auscultation bilaterally and normal percussion bilaterally Back: symmetric, no curvature. ROM normal. No CVA tenderness. Cardio: regular rate and rhythm, S1, S2 normal, no murmur, click, rub or gallop and normal apical impulse GI: soft, non-tender; bowel sounds normal; no masses,  no organomegaly Extremities: extremities normal, atraumatic, no cyanosis or edema Neurologic: Alert and oriented X 3, normal strength and tone. Normal symmetric reflexes. Normal  coordination and gait  ECOG PERFORMANCE STATUS: 0 - Asymptomatic  Blood pressure 132/74, pulse 84, temperature 97.8 F (36.6 C), temperature source Oral, height 5\' 1"  (1.549 m), weight 162 lb 12.8 oz (73.846 kg).  LABORATORY DATA: Lab Results  Component Value Date   WBC 6.3 01/11/2012   HGB 13.2 01/11/2012   HCT 40.3 01/11/2012   MCV 89.8 01/11/2012   PLT 231 01/11/2012      Chemistry      Component Value Date/Time   NA 139 01/11/2012 1001   K 4.1 01/11/2012 1001   CL 101 01/11/2012 1001   CO2 28 01/11/2012 1001   BUN 15 01/11/2012 1001   CREATININE 0.97 01/11/2012 1001      Component Value Date/Time   CALCIUM 9.9  01/11/2012 1001   ALKPHOS 102 01/11/2012 1001   AST 31 01/11/2012 1001   ALT 26 01/11/2012 1001   BILITOT 0.6 01/11/2012 1001       RADIOGRAPHIC STUDIES: none  ASSESSMENT: 66 year old female with 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 The final pathology revealed pure DCIS 2 foci one measuring 0.3 the other one measuring 0.1 cm. It are mediate grade, however superior margin was positive. A reexcision is planned by Dr. Abbey Chatters. The patient and I did discuss be 58 study patient is not interested in this. I will therefore refer her to Dr. [name] radiation after patient has had a reexcision.   PLAN:  1. Proceed with RT then ant-estrogen therapy. 2. Follow up in 8 weeks All questions were answered. The patient knows to call the clinic with any problems, questions or concerns. We can certainly see the patient much sooner if necessary.  I spent 20 minutes counseling the patient face to face. The total time spent in the appointment was 30 minutes.    [name], MD Medical/Oncology [facility] [phone] (beeper) [phone] (Office)  01/12/2012, 12:09 PM

## 2012-01-15 ENCOUNTER — Ambulatory Visit
Admission: RE | Admit: 2012-01-15 | Discharge: 2012-01-15 | Disposition: A | Discharge status: Home / Self Care | Payer: BC Managed Care – PPO | Source: Ambulatory Visit | Attending: Radiation Oncology | Admitting: Radiation Oncology

## 2012-01-15 DIAGNOSIS — C50519 Malignant neoplasm of lower-outer quadrant of unspecified female breast: Secondary | ICD-10-CM

## 2012-01-15 NOTE — Progress Notes (Signed)
[facility] Radiation Oncology Simulation Verification Note   Name: Melissa Perez MRN: [id number]   Date: 01/15/2012  DOB: Jan 21, 1946  Status:outpatient   DIAGNOSIS: DCIS of the right breast  POSITION: Patient was placed in the supine position on the treatment machine.  Isocenter and MLCS were reviewed and treatment was approved.  NARRATIVE: Patient tolerated simulation well.

## 2012-01-16 ENCOUNTER — Ambulatory Visit
Admission: RE | Admit: 2012-01-16 | Discharge: 2012-01-16 | Disposition: A | Discharge status: Home / Self Care | Payer: BC Managed Care – PPO | Source: Ambulatory Visit | Attending: Radiation Oncology | Admitting: Radiation Oncology

## 2012-01-17 ENCOUNTER — Ambulatory Visit
Admission: RE | Admit: 2012-01-17 | Discharge: 2012-01-17 | Disposition: A | Discharge status: Home / Self Care | Payer: BC Managed Care – PPO | Source: Ambulatory Visit | Attending: Radiation Oncology | Admitting: Radiation Oncology

## 2012-01-18 ENCOUNTER — Ambulatory Visit
Admission: RE | Admit: 2012-01-18 | Discharge: 2012-01-18 | Disposition: A | Discharge status: Home / Self Care | Payer: BC Managed Care – PPO | Source: Ambulatory Visit | Attending: Radiation Oncology | Admitting: Radiation Oncology

## 2012-01-21 ENCOUNTER — Ambulatory Visit
Admission: RE | Admit: 2012-01-21 | Discharge: 2012-01-21 | Disposition: A | Discharge status: Home / Self Care | Payer: BC Managed Care – PPO | Source: Ambulatory Visit | Attending: Radiation Oncology | Admitting: Radiation Oncology

## 2012-01-22 ENCOUNTER — Ambulatory Visit
Admission: RE | Admit: 2012-01-22 | Discharge: 2012-01-22 | Disposition: A | Discharge status: Home / Self Care | Payer: BC Managed Care – PPO | Source: Ambulatory Visit | Attending: Radiation Oncology | Admitting: Radiation Oncology

## 2012-01-22 ENCOUNTER — Inpatient Hospital Stay
Admission: RE | Admit: 2012-01-22 | Discharge: 2012-01-22 | Payer: BC Managed Care – PPO | Source: Ambulatory Visit | Attending: Radiation Oncology | Admitting: Radiation Oncology

## 2012-01-22 DIAGNOSIS — C50519 Malignant neoplasm of lower-outer quadrant of unspecified female breast: Secondary | ICD-10-CM

## 2012-01-22 NOTE — Progress Notes (Signed)
NO C/O TODAY, SAYS THAT SOMETIMES SHE GETS A THROBBING IN HER BREAST AND SHE SAID "IT IS PROBABLY HEALING".  I AGREED WITH HER

## 2012-01-22 NOTE — Progress Notes (Signed)
Weekly Management Note Current Dose: 9  Gy  Projected Dose: 61 Gy   Narrative:  The patient presents for routine under treatment assessment.  CBCT/MVCT images/Port film x-rays were reviewed.  The chart was checked. Doing well. Occasional breast pain.   Physical Findings: Weight:  . Unchanged skin.  Impression:  The patient is tolerating radiation.  Plan:  Continue treatment as planned. Continue radiaplex.

## 2012-01-23 ENCOUNTER — Ambulatory Visit
Admission: RE | Admit: 2012-01-23 | Discharge: 2012-01-23 | Disposition: A | Discharge status: Home / Self Care | Payer: BC Managed Care – PPO | Source: Ambulatory Visit | Attending: Radiation Oncology | Admitting: Radiation Oncology

## 2012-01-24 ENCOUNTER — Ambulatory Visit
Admission: RE | Admit: 2012-01-24 | Discharge: 2012-01-24 | Disposition: A | Discharge status: Home / Self Care | Payer: BC Managed Care – PPO | Source: Ambulatory Visit | Attending: Radiation Oncology | Admitting: Radiation Oncology

## 2012-01-25 ENCOUNTER — Ambulatory Visit
Admission: RE | Admit: 2012-01-25 | Discharge: 2012-01-25 | Disposition: A | Discharge status: Home / Self Care | Payer: BC Managed Care – PPO | Source: Ambulatory Visit | Attending: Radiation Oncology | Admitting: Radiation Oncology

## 2012-01-28 ENCOUNTER — Ambulatory Visit
Admission: RE | Admit: 2012-01-28 | Discharge: 2012-01-28 | Disposition: A | Discharge status: Home / Self Care | Payer: BC Managed Care – PPO | Source: Ambulatory Visit | Attending: Radiation Oncology | Admitting: Radiation Oncology

## 2012-01-29 ENCOUNTER — Ambulatory Visit
Admission: RE | Admit: 2012-01-29 | Discharge: 2012-01-29 | Disposition: A | Discharge status: Home / Self Care | Payer: BC Managed Care – PPO | Source: Ambulatory Visit | Attending: Radiation Oncology | Admitting: Radiation Oncology

## 2012-01-29 DIAGNOSIS — Z9889 Other specified postprocedural states: Secondary | ICD-10-CM | POA: Insufficient documentation

## 2012-01-29 DIAGNOSIS — C50519 Malignant neoplasm of lower-outer quadrant of unspecified female breast: Secondary | ICD-10-CM | POA: Insufficient documentation

## 2012-01-29 NOTE — Progress Notes (Signed)
Weekly Management Note Current Dose: 12.6  Gy  Projected Dose: 61 Gy   Narrative:  The patient presents for routine under treatment assessment.  CBCT/MVCT images/Port film x-rays were reviewed.  The chart was checked. Doing well. Celebrated her birthday yesterday. Some right shoulder pain.  Not enough to go back on methotrexate right now.   Physical Findings: Weight: 162 lb 14.4 oz (73.891 kg). Skin slightly dark on right.   Impression:  The patient is tolerating radiation.  Plan:  Continue treatment as planned. Continue radiaplex.

## 2012-01-29 NOTE — Progress Notes (Signed)
SAYS THAT HER APPETITE HAS DECREASED A LITTLE BUT NOT ENOUGH TO CAUSE ANY PROBLEMS, NO C/O N/V.  SKIN IS BEGINNING TO TURN A LITTLE DARKER IN TX AREA.  SHE HAS BEEN EATING SMALLER PORTIONS AT A TIME AND TRYING TO REST SOME IN AFTERNOON TO HELP INCREASE HER EVENING ENERGY LEVEL.  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

## 2012-01-30 ENCOUNTER — Ambulatory Visit
Admission: RE | Admit: 2012-01-30 | Discharge: 2012-01-30 | Disposition: A | Discharge status: Home / Self Care | Payer: BC Managed Care – PPO | Source: Ambulatory Visit | Attending: Radiation Oncology | Admitting: Radiation Oncology

## 2012-01-31 ENCOUNTER — Ambulatory Visit
Admission: RE | Admit: 2012-01-31 | Discharge: 2012-01-31 | Disposition: A | Discharge status: Home / Self Care | Payer: BC Managed Care – PPO | Source: Ambulatory Visit | Attending: Radiation Oncology | Admitting: Radiation Oncology

## 2012-02-01 ENCOUNTER — Ambulatory Visit
Admission: RE | Admit: 2012-02-01 | Discharge: 2012-02-01 | Disposition: A | Discharge status: Home / Self Care | Payer: BC Managed Care – PPO | Source: Ambulatory Visit | Attending: Radiation Oncology | Admitting: Radiation Oncology

## 2012-02-04 ENCOUNTER — Ambulatory Visit
Admission: RE | Admit: 2012-02-04 | Discharge: 2012-02-04 | Disposition: A | Discharge status: Home / Self Care | Payer: BC Managed Care – PPO | Source: Ambulatory Visit | Attending: Radiation Oncology | Admitting: Radiation Oncology

## 2012-02-05 ENCOUNTER — Ambulatory Visit
Admission: RE | Admit: 2012-02-05 | Discharge: 2012-02-05 | Disposition: A | Discharge status: Home / Self Care | Payer: BC Managed Care – PPO | Source: Ambulatory Visit | Attending: Radiation Oncology | Admitting: Radiation Oncology

## 2012-02-05 ENCOUNTER — Encounter: Payer: Self-pay | Admitting: Radiation Oncology

## 2012-02-05 VITALS — Wt 163.6 lb

## 2012-02-05 DIAGNOSIS — C50519 Malignant neoplasm of lower-outer quadrant of unspecified female breast: Secondary | ICD-10-CM

## 2012-02-05 NOTE — Progress Notes (Signed)
C/o some scratching in throat when swallowing, also feels like breast is heavy and swollen. Had one episode of nausea but only lasted a few seconds.  Otherwise everything ok.  Skin looks good, a little darker but no desquamation noted

## 2012-02-05 NOTE — Progress Notes (Signed)
DIAGNOSIS:  Right breast cancer.  NARRATIVE:  Melissa Perez is seen today for weekly assessment.  She has completed 2700 cGy of a planned 6100 cGy directed at the right breast area.  The patient has noticed some heaviness and swelling in the breast.  She has also noticed some mild fatigue.  The patient is applying RadiaPlex to her skin.  The patient has also noticed "scratchy throat", but denies any chills, fever or productive cough.  EXAMINATION:  The patient's throat shows no secondary infection, mucosal lesion or inflammation.  The mucosa is moist.  Examination of the lungs reveals them to be clear.  The heart has a regular rhythm and rate. Examination of the right breast area reveals some hyperpigmentation changes, but no skin breakdown is appreciated.  IMPRESSION/PLAN:  The patient is tolerating her radiation treatments well at this time.  The patient's radiation fields are setting up accurately.  The patient's radiation chart was checked today.  Plan is to continue with breast conservation therapy to a cumulative dose of 6100 cGy.    ______________________________ Billie Lade, Ph.D., M.D. JDK/MEDQ  D:  02/05/2012  T:  02/05/2012  Job:  [id number]

## 2012-02-06 ENCOUNTER — Ambulatory Visit
Admission: RE | Admit: 2012-02-06 | Discharge: 2012-02-06 | Disposition: A | Discharge status: Home / Self Care | Payer: BC Managed Care – PPO | Source: Ambulatory Visit | Attending: Radiation Oncology | Admitting: Radiation Oncology

## 2012-02-07 ENCOUNTER — Ambulatory Visit
Admission: RE | Admit: 2012-02-07 | Discharge: 2012-02-07 | Disposition: A | Discharge status: Home / Self Care | Payer: BC Managed Care – PPO | Source: Ambulatory Visit | Attending: Radiation Oncology | Admitting: Radiation Oncology

## 2012-02-08 ENCOUNTER — Ambulatory Visit
Admission: RE | Admit: 2012-02-08 | Discharge: 2012-02-08 | Disposition: A | Discharge status: Home / Self Care | Payer: BC Managed Care – PPO | Source: Ambulatory Visit | Attending: Radiation Oncology | Admitting: Radiation Oncology

## 2012-02-08 DIAGNOSIS — Z9889 Other specified postprocedural states: Secondary | ICD-10-CM

## 2012-02-08 MED ORDER — ALRA NON-METALLIC DEODORANT (RAD-ONC): 1.0000 "application " | Freq: Once | TOPICAL | Status: DC

## 2012-02-08 MED ORDER — RADIAPLEXRX EX GEL
Freq: Once | CUTANEOUS | Status: AC
  Administered 2012-02-08: 1 via TOPICAL

## 2012-02-08 NOTE — Progress Notes (Signed)
SHANNON CALLED BACK AND SAID AFTER SPEAKING TO PT MORE SHE REALIZED THAT SHE HAD NOT HAD POST SIM ED.  DID POST SIM ED WITH PT, GIVEN RADIAPLEX AND ALRA DEODORANT WITH INSTRUCTIONS FOR USE.  GAVE BOOK, RADIATION AND YOU, WITH PERTINENT INFO MARKED FOR PT TO REVIEW.  SHE VERBALIZES UNDERSTANDING.

## 2012-02-08 NOTE — Progress Notes (Signed)
PATIENT SCHEDULED FOR POST SIM TODAY BUT SHE TOLD THE THERAPIST (SHANNON) THAT SHE HAD ALREADY HAD IT AND HAS HER CREAM ALSO.

## 2012-02-11 ENCOUNTER — Ambulatory Visit
Admission: RE | Admit: 2012-02-11 | Discharge: 2012-02-11 | Disposition: A | Discharge status: Home / Self Care | Payer: BC Managed Care – PPO | Source: Ambulatory Visit | Attending: Radiation Oncology | Admitting: Radiation Oncology

## 2012-02-12 ENCOUNTER — Ambulatory Visit
Admission: RE | Admit: 2012-02-12 | Discharge: 2012-02-12 | Disposition: A | Discharge status: Home / Self Care | Payer: BC Managed Care – PPO | Source: Ambulatory Visit | Attending: Radiation Oncology | Admitting: Radiation Oncology

## 2012-02-12 ENCOUNTER — Ambulatory Visit: Payer: BC Managed Care – PPO | Admitting: Radiation Oncology

## 2012-02-12 DIAGNOSIS — C50519 Malignant neoplasm of lower-outer quadrant of unspecified female breast: Secondary | ICD-10-CM

## 2012-02-12 NOTE — Progress Notes (Signed)
Weekly Management Note Current Dose: 36  Gy  Projected Dose: 61 Gy   Narrative:  The patient presents for routine under treatment assessment.  CBCT/MVCT images/Port film x-rays were reviewed.  The chart was checked.  Doing well. Received radiaplex on Friday. Some nausea.   Physical Findings: Skin is slightly dark on right breast  Impression:  The patient is tolerating radiation.  Plan:  Continue treatment as planned. Continue radiaplex.

## 2012-02-12 NOTE — Progress Notes (Signed)
Here today for put.  C/o having some burning in stomach but no nausea.  Has a cough that is dry most of the time but does bring up mucus once in a while.  Skin looks good with no desquamation, faint color change.  Using Radiaplex

## 2012-02-13 ENCOUNTER — Ambulatory Visit
Admission: RE | Admit: 2012-02-13 | Discharge: 2012-02-13 | Disposition: A | Discharge status: Home / Self Care | Payer: BC Managed Care – PPO | Source: Ambulatory Visit | Attending: Radiation Oncology | Admitting: Radiation Oncology

## 2012-02-13 ENCOUNTER — Encounter: Payer: Self-pay | Admitting: Radiation Oncology

## 2012-02-13 DIAGNOSIS — C50519 Malignant neoplasm of lower-outer quadrant of unspecified female breast: Secondary | ICD-10-CM

## 2012-02-13 NOTE — Progress Notes (Signed)
Name: ANELISE STARON   MRN: [id number]  Date:  02/13/2012   DOB: 03-08-1946  Status:outpatient    DIAGNOSIS: Breast cancer.  CONSENT VERIFIED: yes   SET UP: Patient is setup supine   IMMOBILIZATION:  The following immobilization was used:Custom Moldable Pillow, breast board.   NARRATIVE: Phylliss Blakes underwent complex simulation and treatment planning for her boost treatment today.  Her tumor volume was outlined on the planning CT scan. The dept of her cavity was 4.5 cm.    15  MeV electrons will be prescribed to the 93%  Isodose line.   A block will be used for beam modification purposes.  A special port plan is requested.

## 2012-02-14 ENCOUNTER — Ambulatory Visit
Admission: RE | Admit: 2012-02-14 | Discharge: 2012-02-14 | Disposition: A | Discharge status: Home / Self Care | Payer: BC Managed Care – PPO | Source: Ambulatory Visit | Attending: Radiation Oncology | Admitting: Radiation Oncology

## 2012-02-15 ENCOUNTER — Ambulatory Visit
Admission: RE | Admit: 2012-02-15 | Discharge: 2012-02-15 | Disposition: A | Discharge status: Home / Self Care | Payer: BC Managed Care – PPO | Source: Ambulatory Visit | Attending: Radiation Oncology | Admitting: Radiation Oncology

## 2012-02-18 ENCOUNTER — Ambulatory Visit
Admission: RE | Admit: 2012-02-18 | Discharge: 2012-02-18 | Disposition: A | Discharge status: Home / Self Care | Payer: BC Managed Care – PPO | Source: Ambulatory Visit | Attending: Radiation Oncology | Admitting: Radiation Oncology

## 2012-02-19 ENCOUNTER — Ambulatory Visit
Admission: RE | Admit: 2012-02-19 | Discharge: 2012-02-19 | Disposition: A | Discharge status: Home / Self Care | Payer: BC Managed Care – PPO | Source: Ambulatory Visit | Attending: Radiation Oncology | Admitting: Radiation Oncology

## 2012-02-19 DIAGNOSIS — C50519 Malignant neoplasm of lower-outer quadrant of unspecified female breast: Secondary | ICD-10-CM

## 2012-02-19 MED ORDER — RADIAPLEXRX EX GEL
Freq: Once | CUTANEOUS | Status: AC
  Administered 2012-02-19: 1 via TOPICAL

## 2012-02-19 NOTE — Progress Notes (Signed)
Weekly Management Note Current Dose: 45  Gy  Projected Dose: 61 Gy   Narrative:  The patient presents for routine under treatment assessment.  CBCT/MVCT images/Port film x-rays were reviewed.  The chart was checked. Doing well. No complaints. Vereifed ebeam location on treatment machine.   Physical Findings: slightly dark right breast.   Impression:  The patient is tolerating radiation.  Plan:  Continue treatment as planned. Continue radiaplex. Start boost tomorrow.

## 2012-02-19 NOTE — Progress Notes (Signed)
SEEN BY DR. WENTWORTH 

## 2012-02-20 ENCOUNTER — Ambulatory Visit
Admission: RE | Admit: 2012-02-20 | Discharge: 2012-02-20 | Disposition: A | Discharge status: Home / Self Care | Payer: BC Managed Care – PPO | Source: Ambulatory Visit | Attending: Radiation Oncology | Admitting: Radiation Oncology

## 2012-02-21 ENCOUNTER — Ambulatory Visit
Admission: RE | Admit: 2012-02-21 | Discharge: 2012-02-21 | Disposition: A | Discharge status: Home / Self Care | Payer: BC Managed Care – PPO | Source: Ambulatory Visit | Attending: Radiation Oncology | Admitting: Radiation Oncology

## 2012-02-22 ENCOUNTER — Ambulatory Visit
Admission: RE | Admit: 2012-02-22 | Discharge: 2012-02-22 | Disposition: A | Discharge status: Home / Self Care | Payer: BC Managed Care – PPO | Source: Ambulatory Visit | Attending: Radiation Oncology | Admitting: Radiation Oncology

## 2012-02-25 ENCOUNTER — Ambulatory Visit
Admission: RE | Admit: 2012-02-25 | Discharge: 2012-02-25 | Disposition: A | Discharge status: Home / Self Care | Payer: BC Managed Care – PPO | Source: Ambulatory Visit | Attending: Radiation Oncology | Admitting: Radiation Oncology

## 2012-02-26 ENCOUNTER — Ambulatory Visit
Admission: RE | Admit: 2012-02-26 | Discharge: 2012-02-26 | Disposition: A | Discharge status: Home / Self Care | Payer: BC Managed Care – PPO | Source: Ambulatory Visit | Attending: Radiation Oncology | Admitting: Radiation Oncology

## 2012-02-26 DIAGNOSIS — C50519 Malignant neoplasm of lower-outer quadrant of unspecified female breast: Secondary | ICD-10-CM

## 2012-02-26 NOTE — Progress Notes (Signed)
HERE FOR PUT.  HAS SOME TINGLING IN BREAST BUT NO PAIN, NOTED SKIN DARKER, ? DRY DESQUAMATION UNDER BREAST.  STILL HAVING THE PAIN WITH HER RIGHT SHOULDER DUE TO ARTHRITIS

## 2012-02-26 NOTE — Progress Notes (Signed)
Weekly Management Note Current Dose:55 Gy  Projected Dose: 61 Gy   Narrative:  The patient presents for routine under treatment assessment.  CBCT/MVCT images/Port film x-rays were reviewed.  The chart was checked. Doing well. Some soreness. Has f/u with Dr. Welton Flakes.   Physical Findings: Dark breast. No moist desquamation.  Impression:  The patient is tolerating radiation.  Plan:  Continue treatment as planned. F/u in 1 month. We discussed lotion with vit e.

## 2012-02-27 ENCOUNTER — Ambulatory Visit
Admission: RE | Admit: 2012-02-27 | Discharge: 2012-02-27 | Disposition: A | Discharge status: Home / Self Care | Payer: BC Managed Care – PPO | Source: Ambulatory Visit | Attending: Radiation Oncology | Admitting: Radiation Oncology

## 2012-02-28 ENCOUNTER — Ambulatory Visit
Admission: RE | Admit: 2012-02-28 | Discharge: 2012-02-28 | Disposition: A | Discharge status: Home / Self Care | Payer: BC Managed Care – PPO | Source: Ambulatory Visit | Attending: Radiation Oncology | Admitting: Radiation Oncology

## 2012-02-29 ENCOUNTER — Ambulatory Visit
Admission: RE | Admit: 2012-02-29 | Discharge: 2012-02-29 | Disposition: A | Discharge status: Home / Self Care | Payer: BC Managed Care – PPO | Source: Ambulatory Visit | Attending: Radiation Oncology | Admitting: Radiation Oncology

## 2012-02-29 DIAGNOSIS — C50519 Malignant neoplasm of lower-outer quadrant of unspecified female breast: Secondary | ICD-10-CM

## 2012-02-29 NOTE — Progress Notes (Signed)
NO TRUE PAIN WITH SITE BUT SOME SORENESS, SHE HAS RA AND HAS STARTED HER METHATREXATE AGAIN TO PREVENT A FLARE.  SKIN APPEARS DARK WITH SOME DRY DESQUAMATION UNDER BREAST.  USING RADIAPLEX

## 2012-02-29 NOTE — Progress Notes (Signed)
   Department of Radiation Oncology  Phone:  [phone] Fax:        [phone]  Weekly Treatment Note    Name: Melissa Perez Date: 02/29/2012 MRN: [id number] DOB: 1946/01/22   Current dose: 61 gray  Current fraction: 33   MEDICATIONS: Current Outpatient Prescriptions  Medication Sig Dispense Refill  . calcium carbonate (TUMS - DOSED IN MG ELEMENTAL CALCIUM) 500 MG chewable tablet Chew 1 tablet by mouth as needed.        . folic acid (FOLVITE) 1 MG tablet Take 1 mg by mouth 2 (two) times daily.      . iron polysaccharides (NIFEREX 60) 40-20 MG capsule Take 1 capsule by mouth 3 (three) times daily with meals.        Marland Kitchen loratadine (CLARITIN) 10 MG tablet Take 10 mg by mouth daily.        . methotrexate (RHEUMATREX) 2.5 MG tablet Take by mouth. 8 tablets once a week      . multivitamin (THERAGRAN) per tablet Take 1 tablet by mouth daily.        . valsartan-hydrochlorothiazide (DIOVAN-HCT) 80-12.5 MG per tablet Take 1 tablet by mouth daily.           ALLERGIES: Dilaudid and Hydrocodone   LABORATORY DATA:  Lab Results  Component Value Date   WBC 6.3 01/11/2012   HGB 13.2 01/11/2012   HCT 40.3 01/11/2012   MCV 89.8 01/11/2012   PLT 231 01/11/2012   Lab Results  Component Value Date   NA 139 01/11/2012   K 4.1 01/11/2012   CL 101 01/11/2012   CO2 28 01/11/2012   Lab Results  Component Value Date   ALT 26 01/11/2012   AST 31 01/11/2012   ALKPHOS 102 01/11/2012   BILITOT 0.6 01/11/2012     NARRATIVE: Melissa Perez was seen today for weekly treatment management. The chart was checked and the patient's films were reviewed. The patient finished her final fraction of radiotherapy today. She indicates that she has done very well area some occasional soreness in the breast area but no significant pain. The patient has begun methotrexate again she indicates to prevent a flare.  PHYSICAL EXAMINATION: vitals were not taken for this visit.     the patient's skin looks good today. There is some diffuse  hyperpigmentation and some dry desquamation especially in the inframammary region. No moist desquamation.  ASSESSMENT: The patient finished her final fraction of radiotherapy today. She appears to done well.  PLAN: Followup in one month with Dr. Michell Heinrich.

## 2012-03-11 ENCOUNTER — Encounter: Payer: Self-pay | Admitting: Radiation Oncology

## 2012-03-16 ENCOUNTER — Encounter: Payer: Self-pay | Admitting: Radiation Oncology

## 2012-03-16 NOTE — Progress Notes (Signed)
  Radiation Oncology         [phone] ________________________________  Name: Melissa Perez MRN: [id number]  Date: 03/16/2012  DOB: [birth year]  End of Treatment Note  Diagnosis:   DCIS of right breast      Indication for treatment:  Curative       Radiation treatment dates:   01/16/2012-02/29/12  Site/dose:   Right breast / 45 Gy @ 1.8 Gy/fraction x 25 fractions   Right breast boost / 16 Gy at 2 Gy per fraction x 5 fractions  Beams/energy:   6 and 18 MV photons / 15 MeV electrons  Narrative: The patient tolerated radiation treatment relatively well.   She had the expected dry desquamation.   Plan: The patient has completed radiation treatment. The patient will return to radiation oncology clinic for routine followup in one month. I advised them to call or return sooner if they have any questions or concerns related to their recovery or treatment.  ------------------------------------------------  [name], MD

## 2012-03-25 ENCOUNTER — Telehealth: Payer: Self-pay | Admitting: *Deleted

## 2012-03-25 ENCOUNTER — Other Ambulatory Visit (HOSPITAL_BASED_OUTPATIENT_CLINIC_OR_DEPARTMENT_OTHER): Payer: BC Managed Care – PPO | Admitting: Lab

## 2012-03-25 ENCOUNTER — Ambulatory Visit (HOSPITAL_BASED_OUTPATIENT_CLINIC_OR_DEPARTMENT_OTHER): Payer: BC Managed Care – PPO | Admitting: Oncology

## 2012-03-25 ENCOUNTER — Encounter: Payer: Self-pay | Admitting: Oncology

## 2012-03-25 VITALS — BP 126/76 | HR 80 | Temp 98.3°F | Ht 61.0 in | Wt 163.5 lb

## 2012-03-25 DIAGNOSIS — I10 Essential (primary) hypertension: Secondary | ICD-10-CM

## 2012-03-25 DIAGNOSIS — D0590 Unspecified type of carcinoma in situ of unspecified breast: Secondary | ICD-10-CM

## 2012-03-25 DIAGNOSIS — C50919 Malignant neoplasm of unspecified site of unspecified female breast: Secondary | ICD-10-CM

## 2012-03-25 DIAGNOSIS — C50519 Malignant neoplasm of lower-outer quadrant of unspecified female breast: Secondary | ICD-10-CM

## 2012-03-25 DIAGNOSIS — D72819 Decreased white blood cell count, unspecified: Secondary | ICD-10-CM

## 2012-03-25 LAB — COMPREHENSIVE METABOLIC PANEL
ALT: 27 U/L (ref 0–35)
Alkaline Phosphatase: 88 U/L (ref 39–117)
CO2: 28 mEq/L (ref 19–32)
Sodium: 141 mEq/L (ref 135–145)
Total Bilirubin: 0.4 mg/dL (ref 0.3–1.2)
Total Protein: 6.5 g/dL (ref 6.0–8.3)

## 2012-03-25 LAB — CBC WITH DIFFERENTIAL/PLATELET
EOS%: 2.4 % (ref 0.0–7.0)
Eosinophils Absolute: 0.1 10*3/uL (ref 0.0–0.5)
MCV: 92.3 fL (ref 79.5–101.0)
MONO%: 10.3 % (ref 0.0–14.0)
NEUT#: 1.9 10*3/uL (ref 1.5–6.5)
RBC: 4.16 10*6/uL (ref 3.70–5.45)
RDW: 14.8 % — ABNORMAL HIGH (ref 11.2–14.5)
lymph#: 1.1 10*3/uL (ref 0.9–3.3)
nRBC: 0 % (ref 0–0)

## 2012-03-25 MED ORDER — TAMOXIFEN CITRATE 20 MG PO TABS: 20.0000 mg | ORAL_TABLET | Freq: Every day | ORAL | Status: AC

## 2012-03-25 NOTE — Progress Notes (Signed)
OFFICE PROGRESS NOTE  CC  Dr. Renaye Rakers Dr. Francoise Ceo Dr. Pollyann Savoy Dr. Avel Peace Dr. 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  PRIOR THERAPY:  #1 patient is now status post right breast lumpectomy performed on 11/23/2011. The final pathology revealed a 2 residual foci of intermediate grade ductal carcinoma in situ spanning 0.3 cm and 0.1 cm. One sentinel node was negative for metastatic disease. The ductal carcinoma in situ was less than 0.1 cm away from the superior margin, other margins were negative. Tumor was ER +100% PR +100%. She is S/P re-excision.  #2 S/P  Radiation Therapy to the right breast completed on 02/29/12  CURRENT THERAPY: Tamoxifen 20 mg daily beginning today 03/25/12  INTERVAL HISTORY: Melissa Perez 66 y.o. female returns for followup visit today. She has now completed the radiation as of 02/29/2012. Overall she tolerated the radiation well. She is really without any significant complaints. She denies any fevers chills night sweats headaches no shortness of breath no chest pains or palpitations she has no myalgias or arthralgias. She has healed quite nicely from the radiation and really does not have any erythema or redness or tenderness in her right breast. She has had a hysterectomy. She has no history of strokes or history of having blood clots. Remainder of the 10 point review of systems is negative.  MEDICAL HISTORY: Past Medical History  Diagnosis Date  . Hypertension   . Hyperlipidemia   . S/P lumpectomy of breast 12/03/2011  . Cancer   . Arthritis pain   . Rash   . Breast cancer   . Arthritis     rheumatoid, take Humira  . Arthritis     rheumatoid    ALLERGIES:  is allergic to dilaudid and hydrocodone.  MEDICATIONS:    Current Outpatient Prescriptions  Medication Sig Dispense Refill  . calcium carbonate (TUMS - DOSED IN MG ELEMENTAL CALCIUM) 500 MG chewable tablet Chew 1 tablet by mouth as needed.        . folic acid (FOLVITE) 1 MG tablet Take 1 mg by mouth 2 (two) times daily.      . iron polysaccharides (NIFEREX 60) 40-20 MG capsule Take 1 capsule by mouth daily with breakfast.       . loratadine (CLARITIN) 10 MG tablet Take 10 mg by mouth daily.        . methotrexate (RHEUMATREX) 2.5 MG tablet Take by mouth. 8 tablets once a week      . multivitamin (THERAGRAN) per tablet Take 1 tablet by mouth daily.        . valsartan-hydrochlorothiazide (DIOVAN-HCT) 80-12.5 MG per tablet Take 1 tablet by mouth daily.          SURGICAL HISTORY:  Past Surgical History  Procedure Date  . Abdominal hysterectomy   . Appendectomy   . Left breast biopsies 1991 & 1990  . Breast surgery     2 left breast biopsies-benign  . Mastectomy partial / lumpectomy 12/12    REVIEW OF SYSTEMS:  Pertinent items are noted in HPI.   PHYSICAL EXAMINATION: General appearance: alert, cooperative and appears stated age Resp: clear to auscultation bilaterally and normal percussion bilaterally Back: symmetric, no curvature. ROM normal. No CVA tenderness. Cardio: regular rate and rhythm, S1, S2 normal, no murmur,  click, rub or gallop and normal apical impulse GI: soft, non-tender; bowel sounds normal; no masses,  no organomegaly Extremities: extremities normal, atraumatic, no cyanosis or edema Neurologic: Alert and oriented X 3, normal strength and tone. Normal symmetric reflexes. Normal coordination and gait  ECOG PERFORMANCE STATUS: 0 - Asymptomatic  Blood pressure 126/76, pulse 80, temperature 98.3 F (36.8 C), temperature source Oral, height 5\' 1"  (1.549 m), weight 163 lb 8 oz (74.163 kg).  LABORATORY DATA: Lab Results  Component Value Date   WBC 3.4* 03/25/2012   HGB 13.0 03/25/2012   HCT 38.4 03/25/2012   MCV 92.3 03/25/2012    PLT 203 03/25/2012      Chemistry      Component Value Date/Time   NA 139 01/11/2012 1001   K 4.1 01/11/2012 1001   CL 101 01/11/2012 1001   CO2 28 01/11/2012 1001   BUN 15 01/11/2012 1001   CREATININE 0.97 01/11/2012 1001      Component Value Date/Time   CALCIUM 9.9 01/11/2012 1001   ALKPHOS 102 01/11/2012 1001   AST 31 01/11/2012 1001   ALT 26 01/11/2012 1001   BILITOT 0.6 01/11/2012 1001       RADIOGRAPHIC STUDIES: none  ASSESSMENT: 66 year old female with:  #1 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 The final pathology revealed pure DCIS 2 foci one measuring 0.3 the other one measuring 0.1 cm.  #2 S/P radiation to the right breast completed on 3/29. 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 Begin tamoxifen 20 mg daily. Risks and benefits explained to patient and literature was given to the patient.  2. Follow up in 3 months or sooner if she needs to be seen. She also knows to call us if she is experiencing any side effects.  All questions were answered. The patient knows to call the clinic with any problems, questions or concerns. We can certainly see the patient much sooner if necessary.  I spent counseling the patient face to face. The total time spent in the appointment was 30 minutes.    [name], MD Medical/Oncology [facility] [phone] (beeper) [phone] (Office)  03/25/2012, 9:24 AM

## 2012-03-25 NOTE — Patient Instructions (Signed)
1. You will begin Tamoxifen today. Your prescription was sent to the pharmacy. You will take 20 mg daily. Therapy is planned for 5 years.  2. Information as below:  Tamoxifen oral tablet What is this medicine? TAMOXIFEN (ta MOX i fen) blocks the effects of estrogen. It is commonly used to treat breast cancer. It is also used to decrease the chance of breast cancer coming back in women who have received treatment for the disease. It may also help prevent breast cancer in women who have a high risk of developing breast cancer. This medicine may be used for other purposes; ask your health care provider or pharmacist if you have questions. What should I tell my health care provider before I take this medicine? They need to know if you have any of these conditions: -blood clots -blood disease -cataracts or impaired eyesight -endometriosis -high calcium levels -high cholesterol -irregular menstrual cycles -liver disease -stroke -uterine fibroids -an unusual or allergic reaction to tamoxifen, other medicines, foods, dyes, or preservatives -pregnant or trying to get pregnant -breast-feeding How should I use this medicine? Take this medicine by mouth with a glass of water. Follow the directions on the prescription label. You can take it with or without food. Take your medicine at regular intervals. Do not take your medicine more often than directed. Do not stop taking except on your doctor's advice. A special MedGuide will be given to you by the pharmacist with each prescription and refill. Be sure to read this information carefully each time. Talk to your pediatrician regarding the use of this medicine in children. While this drug may be prescribed for selected conditions, precautions do apply. Overdosage: If you think you have taken too much of this medicine contact a poison control center or emergency room at once. NOTE: This medicine is only for you. Do not share this medicine with  others. What if I miss a dose? If you miss a dose, take it as soon as you can. If it is almost time for your next dose, take only that dose. Do not take double or extra doses. What may interact with this medicine? -aminoglutethimide -bromocriptine -chemotherapy drugs -female hormones, like estrogens and birth control pills -letrozole -medroxyprogesterone -phenobarbital -rifampin -warfarin This list may not describe all possible interactions. Give your health care provider a list of all the medicines, herbs, non-prescription drugs, or dietary supplements you use. Also tell them if you smoke, drink alcohol, or use illegal drugs. Some items may interact with your medicine. What should I watch for while using this medicine? Visit your doctor or health care professional for regular checks on your progress. You will need regular pelvic exams, breast exams, and mammograms. If you are taking this medicine to reduce your risk of getting breast cancer, you should know that this medicine does not prevent all types of breast cancer. If breast cancer or other problems occur, there is no guarantee that it will be found at an early stage. Do not become pregnant while taking this medicine or for 2 months after stopping this medicine. Stop taking this medicine if you get pregnant or think you are pregnant and contact your doctor. This medicine may harm your unborn baby. Women who can possibly become pregnant should use birth control methods that do not use hormones during tamoxifen treatment and for 2 months after therapy has stopped. Talk with your health care provider for birth control advice. Do not breast feed while taking this medicine. What side effects may I notice  from receiving this medicine? Side effects that you should report to your doctor or health care professional as soon as possible: -changes in vision (blurred vision) -changes in your menstrual cycle -difficulty breathing or shortness of  breath -difficulty walking or talking -new breast lumps -numbness -pelvic pain or pressure -redness, blistering, peeling or loosening of the skin, including inside the mouth -skin rash or itching (hives) -sudden chest pain -swelling of lips, face, or tongue -swelling, pain or tenderness in your calf or leg -unusual bruising or bleeding -vaginal discharge that is bloody, brown, or rust -weakness -yellowing of the whites of the eyes or skin Side effects that usually do not require medical attention (report to your doctor or health care professional if they continue or are bothersome): -fatigue -hair loss, although uncommon and is usually mild -headache -hot flashes -impotence (in men) -nausea, vomiting (mild) -vaginal discharge (white or clear) This list may not describe all possible side effects. Call your doctor for medical advice about side effects. You may report side effects to FDA at 1-800-FDA-1088. Where should I keep my medicine? Keep out of the reach of children. Store at room temperature between 20 and 25 degrees C (68 and 77 degrees F). Protect from light. Keep container tightly closed. Throw away any unused medicine after the expiration date. NOTE: This sheet is a summary. It may not cover all possible information. If you have questions about this medicine, talk to your doctor, pharmacist, or health care provider.  2012, [employer]/Gold Standard. (08/05/2008 12:01:56 PM)  3. I will see you back in 3 months but if you feel you need to be seen earlier please call and we will see you.

## 2012-03-25 NOTE — Telephone Encounter (Signed)
gave patient appointment for 06-30-2012 printed out calendar and gave to the patient

## 2012-04-03 ENCOUNTER — Encounter: Payer: Self-pay | Admitting: Radiation Oncology

## 2012-04-03 ENCOUNTER — Ambulatory Visit
Admission: RE | Admit: 2012-04-03 | Discharge: 2012-04-03 | Disposition: A | Discharge status: Home / Self Care | Payer: BC Managed Care – PPO | Source: Ambulatory Visit | Attending: Radiation Oncology | Admitting: Radiation Oncology

## 2012-04-03 VITALS — BP 110/72 | HR 78 | Temp 97.2°F | Resp 18 | Wt 161.6 lb

## 2012-04-03 DIAGNOSIS — C50519 Malignant neoplasm of lower-outer quadrant of unspecified female breast: Secondary | ICD-10-CM

## 2012-04-03 NOTE — Progress Notes (Signed)
HERE TODAY FOR FU OF RIGHT BREAST.  HAS HAD PAIN IN RIGHT SHOULDER SINCE RADIATION, RATES 1 - 2/10.  STARTED BACK ON METHOTREXATE.  STARTED TAMOXIFEN YESTERDAY, 04/02/12.  ALSO DOES ROTATOR CUFF EXERCISES TO HELP SHOULDER    SKIN LOOKS GREAT.  STILL USING RADIAPLEX

## 2012-04-03 NOTE — Progress Notes (Signed)
   Department of Radiation Oncology  Phone:  [phone] Fax:        [phone]   Name: Melissa Perez   DOB: 17-Apr-1946  MRN: [id number]    Date: 04/03/2012  Follow Up Visit Note  CC: Geraldo Pitter, MD  Diagnosis: DCIS of the right breast  Interval since last radiation: One month  Allergies:  Allergies  Allergen Reactions  . Dilaudid (Hydromorphone Hcl) Nausea And Vomiting  . Hydrocodone Nausea And Vomiting    Medications:  Current Outpatient Prescriptions  Medication Sig Dispense Refill  . calcium carbonate (TUMS - DOSED IN MG ELEMENTAL CALCIUM) 500 MG chewable tablet Chew 1 tablet by mouth as needed.        . folic acid (FOLVITE) 1 MG tablet Take 1 mg by mouth 2 (two) times daily.      . iron polysaccharides (NIFEREX 60) 40-20 MG capsule Take 1 capsule by mouth daily with breakfast.       . loratadine (CLARITIN) 10 MG tablet Take 10 mg by mouth daily.        . methotrexate (RHEUMATREX) 2.5 MG tablet Take by mouth. 8 tablets once a week      . multivitamin (THERAGRAN) per tablet Take 1 tablet by mouth daily.        . tamoxifen (NOLVADEX) 20 MG tablet Take 1 tablet (20 mg total) by mouth daily.  90 tablet  12  . valsartan-hydrochlorothiazide (DIOVAN-HCT) 80-12.5 MG per tablet Take 1 tablet by mouth daily.          Interval History: Melissa Perez presents today for routine followup.  She is feeling well and doing well. She is pleased with her cosmetic result. She's been placed back on her methotrexate. Chest right shoulder pain which she is doing exercises for. She started tamoxifen yesterday and has no side effects from that yet. She is using Radiaplex on her skin.  Physical Exam:   weight is 161 lb 9.6 oz (73.301 kg). Her oral temperature is 97.2 F (36.2 C). Her blood pressure is 110/72 and her pulse is 78. Her respiration is 18.  He has some skin darkening on the right breast. It really she has a great cosmetic result. Inframammary fold is clean  IMPRESSION: DCIS of the right  breast with a resolving acute effects of treatment  PLAN:  Melissa Perez is a 66 y.o. female who looks great. We discussed her next mammogram. We discussed the role of tamoxifen in decreasing local recurrence. We discussed skin protection from the time. She is widowed scheduled followup with Dr. Welton Flakes. I will plan on seeing her back on a when necessary basis. She does contact us with any questions or concerns.    [name], MD

## 2012-04-30 NOTE — Progress Notes (Signed)
Encounter addended by: Glennie Hawk, RN on: 04/30/2012  2:24 PM<BR>     Documentation filed: Charges VN

## 2012-06-30 ENCOUNTER — Ambulatory Visit (HOSPITAL_BASED_OUTPATIENT_CLINIC_OR_DEPARTMENT_OTHER): Payer: BC Managed Care – PPO | Admitting: Oncology

## 2012-06-30 ENCOUNTER — Encounter: Payer: Self-pay | Admitting: Oncology

## 2012-06-30 ENCOUNTER — Other Ambulatory Visit (HOSPITAL_BASED_OUTPATIENT_CLINIC_OR_DEPARTMENT_OTHER): Payer: BC Managed Care – PPO | Admitting: Lab

## 2012-06-30 ENCOUNTER — Telehealth: Payer: Self-pay | Admitting: *Deleted

## 2012-06-30 VITALS — BP 126/75 | HR 67 | Temp 98.5°F | Ht 61.0 in | Wt 158.5 lb

## 2012-06-30 DIAGNOSIS — Z170 Estrogen receptor positive status [ER+]: Secondary | ICD-10-CM

## 2012-06-30 DIAGNOSIS — C50519 Malignant neoplasm of lower-outer quadrant of unspecified female breast: Secondary | ICD-10-CM

## 2012-06-30 DIAGNOSIS — D0590 Unspecified type of carcinoma in situ of unspecified breast: Secondary | ICD-10-CM

## 2012-06-30 LAB — COMPREHENSIVE METABOLIC PANEL
Alkaline Phosphatase: 66 U/L (ref 39–117)
BUN: 16 mg/dL (ref 6–23)
Glucose, Bld: 88 mg/dL (ref 70–99)
Total Bilirubin: 0.4 mg/dL (ref 0.3–1.2)

## 2012-06-30 LAB — CBC WITH DIFFERENTIAL/PLATELET
Basophils Absolute: 0 10*3/uL (ref 0.0–0.1)
Eosinophils Absolute: 0.1 10*3/uL (ref 0.0–0.5)
HGB: 12.9 g/dL (ref 11.6–15.9)
LYMPH%: 27.8 % (ref 14.0–49.7)
MCV: 96.3 fL (ref 79.5–101.0)
MONO%: 8.5 % (ref 0.0–14.0)
NEUT#: 3.8 10*3/uL (ref 1.5–6.5)
NEUT%: 62.2 % (ref 38.4–76.8)
Platelets: 203 10*3/uL (ref 145–400)

## 2012-06-30 NOTE — Progress Notes (Signed)
OFFICE PROGRESS NOTE  CC  Dr. Renaye Rakers Dr. Francoise Ceo Dr. Pollyann Savoy Dr. Avel Peace Dr. 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  PRIOR THERAPY:  #1 patient is now status post right breast lumpectomy performed on 11/23/2011. The final pathology revealed a 2 residual foci of intermediate grade ductal carcinoma in situ spanning 0.3 cm and 0.1 cm. One sentinel node was negative for metastatic disease. The ductal carcinoma in situ was less than 0.1 cm away from the superior margin, other margins were negative. Tumor was ER +100% PR +100%. She is S/P re-excision.  #2 S/P  Radiation Therapy to the right breast completed on 02/29/12  CURRENT THERAPY: Tamoxifen 20 mg daily beginning today 03/25/12  INTERVAL HISTORY: Melissa Perez 66 y.o. female returns for followup visit today. Overall she is doing well she is without any complaints except for some aches and pains secondary to her rheumatoid arthritis. She is also having some hot flashes which haven't worsened she has been on tamoxifen. She otherwise denies any vaginal bleeding discharge. She has no nausea or vomiting no headaches double vision blurring of vision no breast tenderness. Remainder of the 10 point review of systems is negative. MEDICAL HISTORY: Past Medical History  Diagnosis Date  . Hypertension   . Hyperlipidemia   . S/P lumpectomy of breast 12/03/2011  . Cancer   . Arthritis pain   . Rash   . Breast cancer   . Arthritis     rheumatoid, take Humira  . Arthritis     rheumatoid    ALLERGIES:  is allergic to dilaudid and hydrocodone.  MEDICATIONS:  Current Outpatient Prescriptions  Medication Sig Dispense Refill  . calcium carbonate (TUMS - DOSED IN MG ELEMENTAL CALCIUM) 500 MG chewable tablet  Chew 1 tablet by mouth as needed.        . folic acid (FOLVITE) 1 MG tablet Take 1 mg by mouth 2 (two) times daily.      . iron polysaccharides (NIFEREX 60) 40-20 MG capsule Take 1 capsule by mouth daily with breakfast.       . loratadine (CLARITIN) 10 MG tablet Take 10 mg by mouth daily.        . methotrexate (RHEUMATREX) 2.5 MG tablet Take by mouth. 8 tablets once a week      . multivitamin (THERAGRAN) per tablet Take 1 tablet by mouth daily.        . tamoxifen (NOLVADEX) 20 MG tablet Take 20 mg by mouth daily.      . valsartan-hydrochlorothiazide (DIOVAN-HCT) 80-12.5 MG per tablet Take 1 tablet by mouth daily.          SURGICAL HISTORY:  Past Surgical History  Procedure Date  . Abdominal hysterectomy   . Appendectomy   . Left breast biopsies 1991 & 1990  . Breast surgery     2 left breast biopsies-benign  . Mastectomy partial / lumpectomy 12/12    REVIEW OF SYSTEMS:  Pertinent items are noted in HPI.   PHYSICAL EXAMINATION: General appearance: alert, cooperative and appears stated age Resp: clear to auscultation bilaterally and normal percussion bilaterally Back: symmetric, no curvature. ROM normal. No CVA tenderness. Cardio: regular rate and rhythm, S1, S2 normal, no murmur, click, rub or gallop and normal apical impulse GI: soft, non-tender;  bowel sounds normal; no masses,  no organomegaly Extremities: extremities normal, atraumatic, no cyanosis or edema Neurologic: Alert and oriented X 3, normal strength and tone. Normal symmetric reflexes. Normal coordination and gait  ECOG PERFORMANCE STATUS: 0 - Asymptomatic  Blood pressure 126/75, pulse 67, temperature 98.5 F (36.9 C), height 5\' 1"  (1.549 m), weight 158 lb 8 oz (71.895 kg).  LABORATORY DATA: Lab Results  Component Value Date   WBC 6.1 06/30/2012   HGB 12.9 06/30/2012   HCT 38.5 06/30/2012   MCV 96.3 06/30/2012   PLT 203 06/30/2012      Chemistry      Component Value Date/Time   NA 141 03/25/2012 0841   K 4.1  03/25/2012 0841   CL 106 03/25/2012 0841   CO2 28 03/25/2012 0841   BUN 14 03/25/2012 0841   CREATININE 0.81 03/25/2012 0841      Component Value Date/Time   CALCIUM 9.5 03/25/2012 0841   ALKPHOS 88 03/25/2012 0841   AST 26 03/25/2012 0841   ALT 27 03/25/2012 0841   BILITOT 0.4 03/25/2012 0841       RADIOGRAPHIC STUDIES: none  ASSESSMENT: 66 year old female with:  #1 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 The final pathology revealed pure DCIS 2 foci one measuring 0.3 the other one measuring 0.1 cm.  #2 S/P radiation to the right breast completed on 3/29. Overall tolerated well  #3 Tamoxifen 20 mg daily since April 2013  PLAN:  1. Patient overall is tolerating tamoxifen well she does experience hot flashes. I have recommended that she try. Peridin C to see if that helps her with the hot flashes.  2 I will see her back in 6 months time or sooner if need arises. She is recommended to be seen by a gynecologist and ophthalmology on a yearly basis. All questions were answered. The patient knows to call the clinic with any problems, questions or concerns. We can certainly see the patient much sooner if necessary.  I spent counseling the patient face to face. The total time spent in the appointment was 30 minutes.    [name], MD Medical/Oncology [facility] [phone] (beeper) [phone] (Office)  06/30/2012, 10:24 AM

## 2012-06-30 NOTE — Patient Instructions (Addendum)
1. For Hot flashes try peradin C over the counter   2. Begin vitamin D3 1000 iu daily (can get over the counter  3. See gynecologist and ophthamologist yearly  4. I will see you back in 6 months

## 2012-06-30 NOTE — Telephone Encounter (Signed)
Gave patient appointment for 12-31-2012 startingt at 9:00am printed out calendar and gave to the patinet

## 2012-07-16 ENCOUNTER — Telehealth: Payer: Self-pay | Admitting: Medical Oncology

## 2012-07-16 NOTE — Telephone Encounter (Signed)
Patient LMOVM "My doctor mentioned that it might be a good idea to get the shingles shot and I just wanted to run it by Dr. Welton Flakes"  Per MD, ok for patient to receive shot.  LMOVM stating this and for patient to return call with any questions or concerns.

## 2012-11-25 ENCOUNTER — Telehealth: Payer: Self-pay | Admitting: *Deleted

## 2012-11-25 NOTE — Telephone Encounter (Signed)
per list of rescheduled patient placed patient on 12-31-2012 with the NP 

## 2012-12-31 ENCOUNTER — Telehealth: Payer: Self-pay | Admitting: Oncology

## 2012-12-31 ENCOUNTER — Other Ambulatory Visit (HOSPITAL_BASED_OUTPATIENT_CLINIC_OR_DEPARTMENT_OTHER): Payer: BC Managed Care – PPO | Admitting: Lab

## 2012-12-31 ENCOUNTER — Other Ambulatory Visit: Payer: BC Managed Care – PPO | Admitting: Lab

## 2012-12-31 ENCOUNTER — Other Ambulatory Visit: Payer: Self-pay | Admitting: Oncology

## 2012-12-31 ENCOUNTER — Ambulatory Visit: Payer: BC Managed Care – PPO | Admitting: Oncology

## 2012-12-31 ENCOUNTER — Ambulatory Visit (HOSPITAL_BASED_OUTPATIENT_CLINIC_OR_DEPARTMENT_OTHER): Payer: BC Managed Care – PPO | Admitting: Adult Health

## 2012-12-31 ENCOUNTER — Encounter: Payer: Self-pay | Admitting: Adult Health

## 2012-12-31 VITALS — BP 136/74 | HR 82 | Temp 98.7°F | Resp 20 | Ht 61.0 in | Wt 152.4 lb

## 2012-12-31 DIAGNOSIS — D0510 Intraductal carcinoma in situ of unspecified breast: Secondary | ICD-10-CM

## 2012-12-31 DIAGNOSIS — Z170 Estrogen receptor positive status [ER+]: Secondary | ICD-10-CM

## 2012-12-31 DIAGNOSIS — C50519 Malignant neoplasm of lower-outer quadrant of unspecified female breast: Secondary | ICD-10-CM

## 2012-12-31 DIAGNOSIS — D0590 Unspecified type of carcinoma in situ of unspecified breast: Secondary | ICD-10-CM

## 2012-12-31 DIAGNOSIS — Z853 Personal history of malignant neoplasm of breast: Secondary | ICD-10-CM

## 2012-12-31 LAB — COMPREHENSIVE METABOLIC PANEL (CC13)
ALT: 17 U/L (ref 0–55)
AST: 16 U/L (ref 5–34)
CO2: 29 mEq/L (ref 22–29)
Calcium: 9.4 mg/dL (ref 8.4–10.4)
Chloride: 103 mEq/L (ref 98–107)
Creatinine: 0.9 mg/dL (ref 0.6–1.1)
Sodium: 142 mEq/L (ref 136–145)
Total Bilirubin: 0.32 mg/dL (ref 0.20–1.20)
Total Protein: 6.9 g/dL (ref 6.4–8.3)

## 2012-12-31 LAB — CBC WITH DIFFERENTIAL/PLATELET
Basophils Absolute: 0 10*3/uL (ref 0.0–0.1)
Eosinophils Absolute: 0 10*3/uL (ref 0.0–0.5)
HGB: 12.6 g/dL (ref 11.6–15.9)
LYMPH%: 12.2 % — ABNORMAL LOW (ref 14.0–49.7)
MCV: 94.5 fL (ref 79.5–101.0)
MONO%: 4.7 % (ref 0.0–14.0)
NEUT#: 9.9 10*3/uL — ABNORMAL HIGH (ref 1.5–6.5)
Platelets: 215 10*3/uL (ref 145–400)
RDW: 14.3 % (ref 11.2–14.5)

## 2012-12-31 NOTE — Patient Instructions (Addendum)
Doing well.  Continue Tamoxifen.  I have ordered your diagnostic mammogram.  Continue exercise, and your annual eye exam, and gynecology appointments.  Please call us if you have any questions or concerns.   We will see you back in 6 months.

## 2012-12-31 NOTE — Progress Notes (Signed)
OFFICE PROGRESS NOTE  CC  Dr. Renaye Rakers Dr. Francoise Ceo Dr. Pollyann Savoy Dr. Avel Peace Dr. 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  PRIOR THERAPY:  #1 patient is now status post right breast lumpectomy performed on 11/23/2011. The final pathology revealed a 2 residual foci of intermediate grade ductal carcinoma in situ spanning 0.3 cm and 0.1 cm. One sentinel node was negative for metastatic disease. The ductal carcinoma in situ was less than 0.1 cm away from the superior margin, other margins were negative. Tumor was ER +100% PR +100%. She is S/P re-excision.  #2 S/P  Radiation Therapy to the right breast completed on 02/29/12  CURRENT THERAPY: Tamoxifen 20 mg daily beginning today 03/25/12  INTERVAL HISTORY: Melissa Perez 67 y.o. female returns for followup visit today.  She's taking her Tamoxifen every day and other than occasional hot flashes she's tolerating it well.  She has some aches and pains secondary to her rheumatoid arthritis.  Otherwise, she's been exercising for the most part, she has had a recent ankle strain.  Her health maintenance was discussed as below.  She's due for a mammogram, but doesn't want to have it due to continued breast tenderness at her surgical site.  Otherwise, a 10 point ROS is neg.   MEDICAL HISTORY: Past Medical History  Diagnosis Date  . Hypertension   . Hyperlipidemia   . S/P lumpectomy of breast 12/03/2011  . Cancer   . Arthritis pain   . Rash   . Breast cancer   . Arthritis     rheumatoid, take Humira  . Arthritis     rheumatoid    ALLERGIES:  is allergic to dilaudid and hydrocodone.  MEDICATIONS:  Current Outpatient Prescriptions  Medication Sig Dispense Refill  . calcium carbonate (TUMS - DOSED IN MG  ELEMENTAL CALCIUM) 500 MG chewable tablet Chew 1 tablet by mouth as needed.        . folic acid (FOLVITE) 1 MG tablet Take 1 mg by mouth 2 (two) times daily.      . iron polysaccharides (NIFEREX 60) 40-20 MG capsule Take 1 capsule by mouth daily with breakfast.       . loratadine (CLARITIN) 10 MG tablet Take 10 mg by mouth daily.        . methotrexate (RHEUMATREX) 2.5 MG tablet Take by mouth. 8 tablets once a week      . multivitamin (THERAGRAN) per tablet Take 1 tablet by mouth daily.        . predniSONE (STERAPRED UNI-PAK) 10 MG tablet       . tamoxifen (NOLVADEX) 20 MG tablet Take 20 mg by mouth daily.      . traMADol (ULTRAM) 50 MG tablet       . valsartan-hydrochlorothiazide (DIOVAN-HCT) 80-12.5 MG per tablet Take 1 tablet by mouth daily.          SURGICAL HISTORY:  Past Surgical History  Procedure Date  . Abdominal hysterectomy   . Appendectomy   . Left breast biopsies 1991 & 1990  . Breast surgery     2 left breast biopsies-benign  . Mastectomy partial / lumpectomy 12/12    REVIEW OF SYSTEMS:  General: fatigue (-), night sweats (-), fever (-), pain (-) Lymph: palpable nodes (-) HEENT: vision changes (-),  mucositis (-), gum bleeding (-), epistaxis (-) Cardiovascular: chest pain (-), palpitations (-) Pulmonary: shortness of breath (-), dyspnea on exertion (-), cough (-), hemoptysis (-) GI:  Early satiety (-), melena (-), dysphagia (-), nausea/vomiting (-), diarrhea (-) GU: dysuria (-), hematuria (-), incontinence (-) Musculoskeletal: joint swelling (-), joint pain (+), back pain (-) Neuro: weakness (-), numbness (-), headache (-), confusion (-) Skin: Rash (-), lesions (-), dryness (-) Psych: depression (-), suicidal/homicidal ideation (-), feeling of hopelessness (-)  Health Maintenance  Mammogram: 10/2011 Colonoscopy: about 10 years ago Bone Density Scan: 1.5 years ago Pap Smear: 2 years ago Eye Exam: 07/2012 Vitamin D Level: n/a Lipid Panel: last year   PHYSICAL  EXAMINATION:  BP 136/74  Pulse 82  Temp 98.7 F (37.1 C) (Oral)  Resp 20  Ht 5\' 1"  (1.549 m)  Wt 152 lb 6.4 oz (69.128 kg)  BMI 28.80 kg/m2 General: Patient is a well appearing female in no acute distress HEENT: PERRLA, sclerae anicteric no conjunctival pallor, MMM Neck: supple, no palpable adenopathy Lungs: clear to auscultation bilaterally, no wheezes, rhonchi, or rales Cardiovascular: regular rate rhythm, S1, S2, no murmurs, rubs or gallops Abdomen: Soft, non-tender, non-distended, normoactive bowel sounds, no HSM Extremities: warm and well perfused, no clubbing, cyanosis, or edema Skin: No rashes or lesions Neuro: Non-focal ECOG PERFORMANCE STATUS: 0 - Asymptomatic Breasts: right breast with thickening at the lumpectomy site and radiation changes.  Left breast: no masses, nodularity.  LABORATORY DATA: Lab Results  Component Value Date   WBC 12.0* 12/31/2012   HGB 12.6 12/31/2012   HCT 37.3 12/31/2012   MCV 94.5 12/31/2012   PLT 215 12/31/2012      Chemistry      Component Value Date/Time   NA 140 06/30/2012 0851   K 4.0 06/30/2012 0851   CL 104 06/30/2012 0851   CO2 28 06/30/2012 0851   BUN 16 06/30/2012 0851   CREATININE 0.89 06/30/2012 0851      Component Value Date/Time   CALCIUM 9.4 06/30/2012 0851   ALKPHOS 66 06/30/2012 0851   AST 20 06/30/2012 0851   ALT 15 06/30/2012 0851   BILITOT 0.4 06/30/2012 0851       RADIOGRAPHIC STUDIES: none  ASSESSMENT: 67 year old female with:  #1 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 The final pathology revealed pure DCIS 2 foci one measuring 0.3 the other one measuring 0.1 cm.  #2 S/P radiation to the right breast completed on 3/29. Overall tolerated well  #3 Tamoxifen 20 mg daily since April 2013  PLAN:  1. Doing well, she will continue Tamoxifen.  She will take her Ultram prior to her mammogram.  I have ordered the mammogram and she is agreeable to this plan.    2 I will see  her back in 6 months time or sooner if need arises. She is recommended to be seen by a gynecologist and ophthalmology on a yearly basis. I recommended her f/u on colonoscopy time.  She will continue to exercise once her ankle recovers from her injury.  All questions were answered. The patient knows to call the clinic with any problems, questions or concerns. We can certainly see the patient much sooner if necessary.  I spent 25 minutes counseling the patient face to face. The total time spent in the appointment was 30 minutes.    Cherie Ouch Lyn Hollingshead, NP Medical Oncology [facility] Phone: [phone]   12/31/2012, 9:29 AM

## 2012-12-31 NOTE — Telephone Encounter (Signed)
gv pt appt schedule for July and mammo appt for 2/10.

## 2013-01-12 ENCOUNTER — Ambulatory Visit
Admission: RE | Admit: 2013-01-12 | Discharge: 2013-01-12 | Disposition: A | Discharge status: Home / Self Care | Payer: Medicare Other | Source: Ambulatory Visit | Attending: Adult Health | Admitting: Adult Health

## 2013-01-12 ENCOUNTER — Other Ambulatory Visit: Payer: Self-pay | Admitting: Adult Health

## 2013-01-12 DIAGNOSIS — Z853 Personal history of malignant neoplasm of breast: Secondary | ICD-10-CM

## 2013-06-29 ENCOUNTER — Other Ambulatory Visit: Payer: Self-pay | Admitting: *Deleted

## 2013-06-29 DIAGNOSIS — D0591 Unspecified type of carcinoma in situ of right breast: Secondary | ICD-10-CM

## 2013-06-29 MED ORDER — TAMOXIFEN CITRATE 20 MG PO TABS: 20.0000 mg | ORAL_TABLET | Freq: Every day | ORAL | Status: DC

## 2013-07-01 ENCOUNTER — Other Ambulatory Visit: Payer: Self-pay | Admitting: Emergency Medicine

## 2013-07-01 DIAGNOSIS — C50519 Malignant neoplasm of lower-outer quadrant of unspecified female breast: Secondary | ICD-10-CM

## 2013-07-02 ENCOUNTER — Ambulatory Visit (HOSPITAL_BASED_OUTPATIENT_CLINIC_OR_DEPARTMENT_OTHER): Payer: Medicare Other | Admitting: Oncology

## 2013-07-02 ENCOUNTER — Telehealth: Payer: Self-pay | Admitting: Oncology

## 2013-07-02 ENCOUNTER — Other Ambulatory Visit (HOSPITAL_BASED_OUTPATIENT_CLINIC_OR_DEPARTMENT_OTHER): Payer: Medicare Other | Admitting: Lab

## 2013-07-02 ENCOUNTER — Encounter: Payer: Self-pay | Admitting: Oncology

## 2013-07-02 VITALS — BP 126/75 | HR 76 | Temp 98.0°F | Resp 20 | Ht 61.0 in | Wt 154.2 lb

## 2013-07-02 DIAGNOSIS — C50519 Malignant neoplasm of lower-outer quadrant of unspecified female breast: Secondary | ICD-10-CM

## 2013-07-02 DIAGNOSIS — C50511 Malignant neoplasm of lower-outer quadrant of right female breast: Secondary | ICD-10-CM

## 2013-07-02 LAB — CBC WITH DIFFERENTIAL/PLATELET
Basophils Absolute: 0 10*3/uL (ref 0.0–0.1)
Eosinophils Absolute: 0.1 10*3/uL (ref 0.0–0.5)
HGB: 11.9 g/dL (ref 11.6–15.9)
MONO#: 0.3 10*3/uL (ref 0.1–0.9)
NEUT#: 4.2 10*3/uL (ref 1.5–6.5)
RDW: 14.3 % (ref 11.2–14.5)
lymph#: 1.5 10*3/uL (ref 0.9–3.3)

## 2013-07-02 LAB — COMPREHENSIVE METABOLIC PANEL (CC13)
Albumin: 3.6 g/dL (ref 3.5–5.0)
BUN: 17.4 mg/dL (ref 7.0–26.0)
Calcium: 9.5 mg/dL (ref 8.4–10.4)
Chloride: 107 mEq/L (ref 98–109)
Glucose: 92 mg/dl (ref 70–140)
Potassium: 3.9 mEq/L (ref 3.5–5.1)

## 2013-07-02 NOTE — Progress Notes (Signed)
OFFICE PROGRESS NOTE  CC  Dr. Renaye Rakers Dr. Francoise Ceo Dr. Pollyann Savoy Dr. Avel Peace Dr. 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  PRIOR THERAPY:  #1 patient is now status post right breast lumpectomy performed on 11/23/2011. The final pathology revealed a 2 residual foci of intermediate grade ductal carcinoma in situ spanning 0.3 cm and 0.1 cm. One sentinel node was negative for metastatic disease. The ductal carcinoma in situ was less than 0.1 cm away from the superior margin, other margins were negative. Tumor was ER +100% PR +100%. She is S/P re-excision.  #2 S/P  Radiation Therapy to the right breast completed on 02/29/12  CURRENT THERAPY: Tamoxifen 20 mg daily beginning today 03/25/12  INTERVAL HISTORY: BRITTIANY Perez 67 y.o. female returns for followup visit today.  She's taking her Tamoxifen every day and other than occasional hot flashes she's tolerating it well.  She has some aches and pains secondary to her rheumatoid arthritis.  Otherwise, she's been exercising for the most part, she has had a recent ankle strain.  Her health maintenance was discussed as below.  She's due for a mammogram, but doesn't want to have it due to continued breast tenderness at her surgical site.  Otherwise, a 10 point ROS is neg.   MEDICAL HISTORY: Past Medical History  Diagnosis Date  . Hypertension   . Hyperlipidemia   . S/P lumpectomy of breast 12/03/2011  . Cancer   . Arthritis pain   . Rash   . Breast cancer   . Arthritis     rheumatoid, take Humira  . Arthritis     rheumatoid    ALLERGIES:  is allergic to dilaudid and hydrocodone.  MEDICATIONS:  Current Outpatient Prescriptions  Medication Sig Dispense Refill  . calcium carbonate (TUMS - DOSED IN MG  ELEMENTAL CALCIUM) 500 MG chewable tablet Chew 1 tablet by mouth as needed.        . Cholecalciferol (VITAMIN D3) 1000 UNITS CAPS Take by mouth.      . folic acid (FOLVITE) 1 MG tablet Take 1 mg by mouth 2 (two) times daily.      . iron polysaccharides (NIFEREX 60) 40-20 MG capsule Take 1 capsule by mouth daily with breakfast.       . loratadine (CLARITIN) 10 MG tablet Take 10 mg by mouth daily.        . methotrexate (RHEUMATREX) 2.5 MG tablet Take by mouth. 8 tablets once a week      . multivitamin (THERAGRAN) per tablet Take 1 tablet by mouth daily.        . tamoxifen (NOLVADEX) 20 MG tablet Take 1 tablet (20 mg total) by mouth daily.  90 tablet  0  . valsartan-hydrochlorothiazide (DIOVAN-HCT) 80-12.5 MG per tablet Take 1 tablet by mouth daily.         No current facility-administered medications for this visit.    SURGICAL HISTORY:  Past Surgical History  Procedure Laterality Date  . Abdominal hysterectomy    . Appendectomy    . Left breast biopsies  1991 & 1990  . Breast surgery      2 left breast biopsies-benign  . Mastectomy partial / lumpectomy  12/12    REVIEW OF SYSTEMS:  General: fatigue (-), night sweats (-), fever (-),  pain (-) Lymph: palpable nodes (-) HEENT: vision changes (-), mucositis (-), gum bleeding (-), epistaxis (-) Cardiovascular: chest pain (-), palpitations (-) Pulmonary: shortness of breath (-), dyspnea on exertion (-), cough (-), hemoptysis (-) GI:  Early satiety (-), melena (-), dysphagia (-), nausea/vomiting (-), diarrhea (-) GU: dysuria (-), hematuria (-), incontinence (-) Musculoskeletal: joint swelling (-), joint pain (+), back pain (-) Neuro: weakness (-), numbness (-), headache (-), confusion (-) Skin: Rash (-), lesions (-), dryness (-) Psych: depression (-), suicidal/homicidal ideation (-), feeling of hopelessness (-)  Health Maintenance  Mammogram: 10/2011 Colonoscopy: about 10 years ago Bone Density Scan: 1.5 years ago Pap Smear: 2 years  ago Eye Exam: 07/2012 Vitamin D Level: n/a Lipid Panel: last year   PHYSICAL EXAMINATION:  BP 126/75  Pulse 76  Temp(Src) 98 F (36.7 C) (Oral)  Resp 20  Ht 5\' 1"  (1.549 m)  Wt 154 lb 3.2 oz (69.945 kg)  BMI 29.15 kg/m2 General: Patient is a well appearing female in no acute distress HEENT: PERRLA, sclerae anicteric no conjunctival pallor, MMM Neck: supple, no palpable adenopathy Lungs: clear to auscultation bilaterally, no wheezes, rhonchi, or rales Cardiovascular: regular rate rhythm, S1, S2, no murmurs, rubs or gallops Abdomen: Soft, non-tender, non-distended, normoactive bowel sounds, no HSM Extremities: warm and well perfused, no clubbing, cyanosis, or edema Skin: No rashes or lesions Neuro: Non-focal ECOG PERFORMANCE STATUS: 0 - Asymptomatic Breasts: right breast with thickening at the lumpectomy site and radiation changes.  Left breast: no masses, nodularity.  LABORATORY DATA: Lab Results  Component Value Date   WBC 6.2 07/02/2013   HGB 11.9 07/02/2013   HCT 35.1 07/02/2013   MCV 94.4 07/02/2013   PLT 216 07/02/2013      Chemistry      Component Value Date/Time   NA 143 07/02/2013 0922   NA 140 06/30/2012 0851   K 3.9 07/02/2013 0922   K 4.0 06/30/2012 0851   CL 103 12/31/2012 0850   CL 104 06/30/2012 0851   CO2 27 07/02/2013 0922   CO2 28 06/30/2012 0851   BUN 17.4 07/02/2013 0922   BUN 16 06/30/2012 0851   CREATININE 0.9 07/02/2013 0922   CREATININE 0.89 06/30/2012 0851      Component Value Date/Time   CALCIUM 9.5 07/02/2013 0922   CALCIUM 9.4 06/30/2012 0851   ALKPHOS 59 07/02/2013 0922   ALKPHOS 66 06/30/2012 0851   AST 24 07/02/2013 0922   AST 20 06/30/2012 0851   ALT 18 07/02/2013 0922   ALT 15 06/30/2012 0851   BILITOT 0.31 07/02/2013 0922   BILITOT 0.4 06/30/2012 0851       RADIOGRAPHIC STUDIES: none  ASSESSMENT: 67 year old female with:  #1 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 The final pathology revealed  pure DCIS 2 foci one measuring 0.3 the other one measuring 0.1 cm.  #2 S/P radiation to the right breast completed on 3/29. Overall tolerated well  #3 Tamoxifen 20 mg daily since April 2013  PLAN:  1. Doing well, she will continue Tamoxifen.  She will take her Ultram prior to her mammogram.  I have ordered the mammogram and she is agreeable to this plan.    2 I will see her back in 12 months time or sooner if need arises. She is recommended to be seen by a gynecologist and ophthalmology on a yearly basis. I recommended her f/u on colonoscopy time.  She will continue to exercise once her ankle  recovers from her injury.  All questions were answered. The patient knows to call the clinic with any problems, questions or concerns. We can certainly see the patient much sooner if necessary.  I spent 25 minutes counseling the patient face to face. The total time spent in the appointment was 30 minutes.   [name], MD Medical/Oncology [facility] [phone] (beeper) [phone] (Office)  07/02/2013, 10:24 AM

## 2013-07-02 NOTE — Patient Instructions (Addendum)
Doing well, continue tamoxifen 20 mg daily  I will see you back in 12 months

## 2013-09-21 ENCOUNTER — Other Ambulatory Visit: Payer: Self-pay | Admitting: *Deleted

## 2013-09-21 DIAGNOSIS — D0591 Unspecified type of carcinoma in situ of right breast: Secondary | ICD-10-CM

## 2013-09-21 MED ORDER — TAMOXIFEN CITRATE 20 MG PO TABS: 20.0000 mg | ORAL_TABLET | Freq: Every day | ORAL | Status: DC

## 2013-12-17 ENCOUNTER — Other Ambulatory Visit: Payer: Self-pay | Admitting: Oncology

## 2013-12-17 DIAGNOSIS — Z853 Personal history of malignant neoplasm of breast: Secondary | ICD-10-CM

## 2014-01-14 ENCOUNTER — Ambulatory Visit
Admission: RE | Admit: 2014-01-14 | Discharge: 2014-01-14 | Disposition: A | Discharge status: Home / Self Care | Payer: Medicare Other | Source: Ambulatory Visit | Attending: Oncology | Admitting: Oncology

## 2014-01-14 DIAGNOSIS — Z853 Personal history of malignant neoplasm of breast: Secondary | ICD-10-CM

## 2014-03-22 NOTE — Telephone Encounter (Signed)
Please see Visit Info comments 

## 2014-07-01 ENCOUNTER — Ambulatory Visit: Payer: Medicare Other | Admitting: Oncology

## 2014-07-01 ENCOUNTER — Other Ambulatory Visit: Payer: Medicare Other | Admitting: Lab

## 2014-10-04 ENCOUNTER — Other Ambulatory Visit: Payer: Self-pay | Admitting: *Deleted

## 2014-10-04 ENCOUNTER — Telehealth: Payer: Self-pay | Admitting: Hematology and Oncology

## 2014-10-04 ENCOUNTER — Ambulatory Visit (HOSPITAL_BASED_OUTPATIENT_CLINIC_OR_DEPARTMENT_OTHER): Payer: Medicare Other | Admitting: Hematology and Oncology

## 2014-10-04 ENCOUNTER — Encounter: Payer: Self-pay | Admitting: *Deleted

## 2014-10-04 VITALS — BP 135/56 | HR 87 | Temp 98.5°F | Resp 18 | Ht 61.0 in | Wt 152.4 lb

## 2014-10-04 DIAGNOSIS — Z170 Estrogen receptor positive status [ER+]: Secondary | ICD-10-CM

## 2014-10-04 DIAGNOSIS — D0591 Unspecified type of carcinoma in situ of right breast: Secondary | ICD-10-CM

## 2014-10-04 DIAGNOSIS — C50512 Malignant neoplasm of lower-outer quadrant of left female breast: Secondary | ICD-10-CM

## 2014-10-04 MED ORDER — TAMOXIFEN CITRATE 20 MG PO TABS: 20.0000 mg | ORAL_TABLET | Freq: Every day | ORAL | Status: DC

## 2014-10-04 NOTE — Progress Notes (Signed)
Patient brought copy of labs from [facility] to scan.

## 2014-10-04 NOTE — Progress Notes (Signed)
Patient Care Team: Lucianne Lei, MD as PCP - General (Family Medicine) Lucianne Lei, MD as Attending Physician (Family Medicine) Bo Merino, MD as Attending Physician (Rheumatology) Frederico Hamman, MD as Consulting Physician (Obstetrics and Gynecology)  DIAGNOSIS: DCIS of lower outer quadrant.    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 She is tolerating tamoxifen fairly well without any major problems. She complains of hot flashes which have been a problem even prior to starting tamoxifen. Beyond that she is without new complaints. She has been getting annual mammograms in February for a year. She gets a 3-D mammogram. She denies new lumps or nodules in the breasts.  REVIEW OF SYSTEMS:   Constitutional: Denies fevers, chills or abnormal weight loss; hot flashes Eyes: Denies blurriness of vision Ears, nose, mouth, throat, and face: Denies mucositis or sore throat Respiratory: Denies cough, dyspnea or wheezes Cardiovascular: Denies palpitation, chest discomfort or lower extremity swelling Gastrointestinal:  Denies nausea, heartburn or change in bowel habits Skin: Denies abnormal skin rashes Lymphatics: Denies new lymphadenopathy or easy bruising Neurological:Denies numbness, tingling or new weaknesses Behavioral/Psych: Mood is stable, no new changes  Breast:  denies any pain or lumps or nodules in either breasts All other systems were reviewed with the patient and are negative.  I have reviewed the past medical history, past surgical history, social history and family history with the patient and  they are unchanged from previous note.  ALLERGIES:  is allergic to dilaudid and hydrocodone.  MEDICATIONS:  Current Outpatient Prescriptions  Medication Sig Dispense Refill  . calcium carbonate (TUMS - DOSED IN MG ELEMENTAL CALCIUM) 500 MG chewable tablet Chew 1 tablet by mouth as needed.      . Cholecalciferol (VITAMIN D3) 1000 UNITS CAPS Take by mouth.    . folic acid (FOLVITE) 1 MG tablet Take 1 mg by mouth 2 (two) times daily.    . hydroxychloroquine (PLAQUENIL) 200 MG tablet   1  . iron polysaccharides (NIFEREX 60) 40-20 MG capsule Take 1 capsule by mouth daily with breakfast.     . loratadine (CLARITIN) 10 MG tablet Take 10 mg by mouth daily as needed.     . methotrexate (RHEUMATREX) 2.5 MG tablet Take 10 tablets every week  1  . tamoxifen (NOLVADEX) 20 MG tablet Take 1 tablet (20 mg total) by mouth daily. 90 tablet 3  . UNABLE TO FIND 1 tablet daily. One a Day Women's 50+    . valsartan-hydrochlorothiazide (DIOVAN-HCT) 80-12.5 MG per tablet Take 1 tablet by mouth daily.      . VOLTAREN 1 % GEL   2   No current facility-administered medications for this visit.    PHYSICAL EXAMINATION: ECOG PERFORMANCE STATUS: 1 - Symptomatic but completely ambulatory  Filed Vitals:   10/04/14 0957  BP: 135/56  Pulse: 87  Temp: 98.5 F (36.9 C)  Resp: 18   Filed Weights   10/04/14 0957  Weight: 152 lb 6.4 oz (69.128 kg)    GENERAL:alert, no distress and comfortable SKIN: skin color, texture, turgor are normal, no rashes or significant lesions EYES: normal, Conjunctiva are pink and non-injected, sclera  clear OROPHARYNX:no exudate, no erythema and lips, buccal mucosa, and tongue normal  NECK: supple, thyroid normal size, non-tender, without nodularity LYMPH:  no palpable lymphadenopathy in the cervical, axillary or inguinal LUNGS: clear to auscultation and percussion with normal breathing effort HEART: regular rate & rhythm and no murmurs and no lower extremity edema ABDOMEN:abdomen  soft, non-tender and normal bowel sounds Musculoskeletal:no cyanosis of digits and no clubbing  NEURO: alert & oriented x 3 with fluent speech, no focal motor/sensory deficits BREAST: No palpable masses or nodules in either right or left breasts. No palpable axillary supraclavicular or infraclavicular adenopathy no breast tenderness or nipple discharge.   LABORATORY DATA:  I have reviewed the data as listed   Chemistry      Component Value Date/Time   NA 143 07/02/2013 0922   NA 140 06/30/2012 0851   K 3.9 07/02/2013 0922   K 4.0 06/30/2012 0851   CL 103 12/31/2012 0850   CL 104 06/30/2012 0851   CO2 27 07/02/2013 0922   CO2 28 06/30/2012 0851   BUN 17.4 07/02/2013 0922   BUN 16 06/30/2012 0851   CREATININE 0.9 07/02/2013 0922   CREATININE 0.89 06/30/2012 0851      Component Value Date/Time   CALCIUM 9.5 07/02/2013 0922   CALCIUM 9.4 06/30/2012 0851   ALKPHOS 59 07/02/2013 0922   ALKPHOS 66 06/30/2012 0851   AST 24 07/02/2013 0922   AST 20 06/30/2012 0851   ALT 18 07/02/2013 0922   ALT 15 06/30/2012 0851   BILITOT 0.31 07/02/2013 0922   BILITOT 0.4 06/30/2012 0851       Lab Results  Component Value Date   WBC 6.2 07/02/2013   HGB 11.9 07/02/2013   HCT 35.1 07/02/2013   MCV 94.4 07/02/2013   PLT 216 07/02/2013   NEUTROABS 4.2 07/02/2013   ASSESSMENT & PLAN:  DCIS of lower outer quadrant.  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 She has had a lot of hot flashes even prior to starting tamoxifen. This continues to be a problem for her. Plan is to treat her with 5 years of tamoxifen.  Breast cancer  surveillance: Patient will have a mammogram February of 2016. Today's breast exam was normal. I reviewed her blood work which was normal as well. Return to clinic in 6 months for followup.  The patient brings blood work from other physicians, we do not need to redraw her blood counts here. Patient was concerned that she was thinking $100 for the 3-D diagnostic mammogram. I discussed with her that it is usually $50. Patient will check with the mammography center regarding the cost. No orders of the defined types were placed in this encounter.   The patient has a good understanding of the overall plan. she agrees with it. She will call with any problems that may develop before her next visit here.  I spent 15 minutes counseling the patient face to face. The total time spent in the appointment was 20 minutes and more than 50% was on counseling and review of test results    Rulon Eisenmenger, MD 10/04/2014 11:00 AM

## 2014-10-04 NOTE — Assessment & Plan Note (Signed)
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 She has had a lot of hot flashes even prior to starting tamoxifen. This continues to be a problem for her. Plan is to treat her with 5 years of tamoxifen.  Breast cancer surveillance: Patient will have a mammogram February of 2016. Today's breast exam was normal. I reviewed her blood work which was normal as well. Return to clinic in 6 months for followup.

## 2014-10-04 NOTE — Telephone Encounter (Signed)
, °

## 2015-01-05 ENCOUNTER — Other Ambulatory Visit: Payer: Self-pay | Admitting: Hematology and Oncology

## 2015-01-05 DIAGNOSIS — Z853 Personal history of malignant neoplasm of breast: Secondary | ICD-10-CM

## 2015-01-17 ENCOUNTER — Inpatient Hospital Stay: Admission: RE | Admit: 2015-01-17 | Payer: Self-pay | Source: Ambulatory Visit

## 2015-01-24 ENCOUNTER — Ambulatory Visit
Admission: RE | Admit: 2015-01-24 | Discharge: 2015-01-24 | Disposition: A | Discharge status: Home / Self Care | Payer: Medicare Other | Source: Ambulatory Visit | Attending: Hematology and Oncology | Admitting: Hematology and Oncology

## 2015-01-24 DIAGNOSIS — Z853 Personal history of malignant neoplasm of breast: Secondary | ICD-10-CM

## 2015-03-04 ENCOUNTER — Other Ambulatory Visit (HOSPITAL_COMMUNITY)
Admission: RE | Admit: 2015-03-04 | Discharge: 2015-03-04 | Disposition: A | Discharge status: Home / Self Care | Payer: Medicare Other | Source: Ambulatory Visit | Attending: Family Medicine | Admitting: Family Medicine

## 2015-03-04 ENCOUNTER — Other Ambulatory Visit: Payer: Medicare Other | Admitting: Family Medicine

## 2015-03-04 DIAGNOSIS — Z113 Encounter for screening for infections with a predominantly sexual mode of transmission: Secondary | ICD-10-CM | POA: Insufficient documentation

## 2015-03-04 DIAGNOSIS — N760 Acute vaginitis: Secondary | ICD-10-CM | POA: Diagnosis present

## 2015-03-09 LAB — URINE CYTOLOGY ANCILLARY ONLY
Bacterial vaginitis: NEGATIVE
CANDIDA VAGINITIS: NEGATIVE
Chlamydia: NEGATIVE
Neisseria Gonorrhea: NEGATIVE
Trichomonas: NEGATIVE

## 2015-04-04 NOTE — Assessment & Plan Note (Signed)
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 Breast exam 04/05/15: Normal 2. Mammogram 01/24/15 No abnormalities. Postsurgical changes. Breast Density Category B. I recommended that she get 3-D mammograms for surveillance. Discussed the differences between different breast density categories.

## 2015-04-05 ENCOUNTER — Telehealth: Payer: Self-pay | Admitting: Hematology and Oncology

## 2015-04-05 ENCOUNTER — Encounter (INDEPENDENT_AMBULATORY_CARE_PROVIDER_SITE_OTHER): Payer: Self-pay

## 2015-04-05 ENCOUNTER — Ambulatory Visit (HOSPITAL_BASED_OUTPATIENT_CLINIC_OR_DEPARTMENT_OTHER): Payer: Medicare Other | Admitting: Hematology and Oncology

## 2015-04-05 VITALS — BP 146/58 | HR 90 | Temp 98.0°F | Resp 18 | Ht 61.0 in | Wt 156.5 lb

## 2015-04-05 DIAGNOSIS — Z853 Personal history of malignant neoplasm of breast: Secondary | ICD-10-CM

## 2015-04-05 DIAGNOSIS — C50511 Malignant neoplasm of lower-outer quadrant of right female breast: Secondary | ICD-10-CM

## 2015-04-05 DIAGNOSIS — Z79810 Long term (current) use of selective estrogen receptor modulators (SERMs): Secondary | ICD-10-CM | POA: Diagnosis not present

## 2015-04-05 NOTE — Progress Notes (Signed)
Patient Care Team: Lucianne Lei, MD as PCP - General (Family Medicine) Lucianne Lei, MD as Attending Physician (Family Medicine) Bo Merino, MD as Attending Physician (Rheumatology) Frederico Hamman, MD as Consulting Physician (Obstetrics and Gynecology)  DIAGNOSIS: DCIS of lower outer quadrant.    Staging form: Breast, AJCC 7th Edition     Clinical: Stage 0 (Tis, N0, cM0) - Signed by Thea Silversmith, MD on 11/01/2011   SUMMARY OF ONCOLOGIC HISTORY:   DCIS of lower outer quadrant.    11/23/2011 Surgery Right breast lumpectomy: Residual foci of intermediate grade DCIS 0.3 cm 0.1 cm, 0/1 lymph node, grade 2, ER 100%, PR 100%   01/22/2012 - 02/29/2012 Radiation Therapy Adjuvant radiation therapy   03/22/2012 -  Anti-estrogen oral therapy Adjuvant tamoxifen 20 mg daily    CHIEF COMPLIANT: Follow-up of right breast DCIS on tamoxifen  INTERVAL HISTORY: Melissa Perez is a 69 year old with above-mentioned history of right-sided lumpectomy followed by radiation and adjuvant tamoxifen that started 03/22/2012. She is tolerating tamoxifen extremely well without any major problems or concerns.  REVIEW OF SYSTEMS:   Constitutional: Denies fevers, chills or abnormal weight loss Eyes: Denies blurriness of vision Ears, nose, mouth, throat, and face: Denies mucositis or sore throat Respiratory: Denies cough, dyspnea or wheezes Cardiovascular: Denies palpitation, chest discomfort or lower extremity swelling Gastrointestinal:  Denies nausea, heartburn or change in bowel habits Skin: Denies abnormal skin rashes Lymphatics: Denies new lymphadenopathy or easy bruising Neurological:Denies numbness, tingling or new weaknesses Behavioral/Psych: Mood is stable, no new changes  All other systems were reviewed with the patient and are negative.  I have reviewed the past medical history, past surgical history, social history and family history with the patient and they are unchanged from previous  note.  ALLERGIES:  is allergic to dilaudid and hydrocodone.  MEDICATIONS:  Current Outpatient Prescriptions  Medication Sig Dispense Refill  . calcium carbonate (TUMS - DOSED IN MG ELEMENTAL CALCIUM) 500 MG chewable tablet Chew 1 tablet by mouth as needed.      . Cholecalciferol (VITAMIN D3) 1000 UNITS CAPS Take by mouth.    . folic acid (FOLVITE) 1 MG tablet Take 1 mg by mouth 2 (two) times daily.    . hydroxychloroquine (PLAQUENIL) 200 MG tablet   1  . iron polysaccharides (NIFEREX 60) 40-20 MG capsule Take 1 capsule by mouth daily with breakfast.     . loratadine (CLARITIN) 10 MG tablet Take 10 mg by mouth daily as needed.     . methotrexate (RHEUMATREX) 2.5 MG tablet Take 10 tablets every week  1  . tamoxifen (NOLVADEX) 20 MG tablet Take 1 tablet (20 mg total) by mouth daily. 90 tablet 3  . UNABLE TO FIND 1 tablet daily. One a Day Women's 50+    . valsartan-hydrochlorothiazide (DIOVAN-HCT) 80-12.5 MG per tablet Take 1 tablet by mouth daily.      . VOLTAREN 1 % GEL   2   No current facility-administered medications for this visit.    PHYSICAL EXAMINATION: ECOG PERFORMANCE STATUS: 0 - Asymptomatic  There were no vitals filed for this visit. There were no vitals filed for this visit.  GENERAL:alert, no distress and comfortable SKIN: skin color, texture, turgor are normal, no rashes or significant lesions EYES: normal, Conjunctiva are pink and non-injected, sclera clear OROPHARYNX:no exudate, no erythema and lips, buccal mucosa, and tongue normal  NECK: supple, thyroid normal size, non-tender, without nodularity LYMPH:  no palpable lymphadenopathy in the cervical, axillary or inguinal LUNGS: clear to  auscultation and percussion with normal breathing effort HEART: regular rate & rhythm and no murmurs and no lower extremity edema ABDOMEN:abdomen soft, non-tender and normal bowel sounds Musculoskeletal:no cyanosis of digits and no clubbing  NEURO: alert & oriented x 3 with fluent  speech, no focal motor/sensory deficits BREAST: No palpable masses or nodules in either right or left breasts. No palpable axillary supraclavicular or infraclavicular adenopathy no breast tenderness or nipple discharge. (exam performed in the presence of a chaperone)  LABORATORY DATA:  I have reviewed the data as listed   Chemistry      Component Value Date/Time   NA 143 07/02/2013 0922   NA 140 06/30/2012 0851   K 3.9 07/02/2013 0922   K 4.0 06/30/2012 0851   CL 103 12/31/2012 0850   CL 104 06/30/2012 0851   CO2 27 07/02/2013 0922   CO2 28 06/30/2012 0851   BUN 17.4 07/02/2013 0922   BUN 16 06/30/2012 0851   CREATININE 0.9 07/02/2013 0922   CREATININE 0.89 06/30/2012 0851      Component Value Date/Time   CALCIUM 9.5 07/02/2013 0922   CALCIUM 9.4 06/30/2012 0851   ALKPHOS 59 07/02/2013 0922   ALKPHOS 66 06/30/2012 0851   AST 24 07/02/2013 0922   AST 20 06/30/2012 0851   ALT 18 07/02/2013 0922   ALT 15 06/30/2012 0851   BILITOT 0.31 07/02/2013 0922   BILITOT 0.4 06/30/2012 0851       Lab Results  Component Value Date   WBC 6.2 07/02/2013   HGB 11.9 07/02/2013   HCT 35.1 07/02/2013   MCV 94.4 07/02/2013   PLT 216 07/02/2013   NEUTROABS 4.2 07/02/2013     RADIOGRAPHIC STUDIES: I have personally reviewed the radiology reports and agreed with their findings. Mammogram 01/22/2015 is normal  ASSESSMENT & PLAN:  DCIS of lower outer quadrant.  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 Hot flashes that make it very uncomfortable for her especially at night.   Breast Cancer Surveillance: 1. Breast exam 04/05/15: Normal 2. Mammogram  01/24/15 No abnormalities. Postsurgical changes. Breast Density Category B. I recommended that she get 3-D mammograms for surveillance. Discussed the differences between different breast density categories.   No orders of the defined types were placed in this encounter.   The patient has a good understanding of the overall plan. she agrees with it. she will call with any problems that may develop before the next visit here.   Rulon Eisenmenger, MD

## 2015-04-05 NOTE — Telephone Encounter (Signed)
Gave avs & calendar for November.  °

## 2015-09-15 ENCOUNTER — Other Ambulatory Visit: Payer: Self-pay | Admitting: Hematology and Oncology

## 2015-09-15 DIAGNOSIS — D0510 Intraductal carcinoma in situ of unspecified breast: Secondary | ICD-10-CM

## 2015-10-03 NOTE — Assessment & Plan Note (Signed)
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 Hot flashes that make it very uncomfortable for her especially at night.   Breast Cancer Surveillance: 1. Breast exam 11/1//16: Normal 2. Mammogram 01/24/15 No abnormalities. Postsurgical changes. Breast Density Category B. I recommended that she get 3-D mammograms for surveillance. Discussed the differences between different breast density categories.

## 2015-10-04 ENCOUNTER — Encounter: Payer: Self-pay | Admitting: Hematology and Oncology

## 2015-10-04 ENCOUNTER — Telehealth: Payer: Self-pay | Admitting: Hematology and Oncology

## 2015-10-04 ENCOUNTER — Ambulatory Visit (HOSPITAL_BASED_OUTPATIENT_CLINIC_OR_DEPARTMENT_OTHER): Payer: Medicare Other | Admitting: Hematology and Oncology

## 2015-10-04 VITALS — BP 128/54 | HR 71 | Temp 98.2°F | Resp 18 | Ht 61.0 in | Wt 155.5 lb

## 2015-10-04 DIAGNOSIS — Z86000 Personal history of in-situ neoplasm of breast: Secondary | ICD-10-CM

## 2015-10-04 DIAGNOSIS — C50511 Malignant neoplasm of lower-outer quadrant of right female breast: Secondary | ICD-10-CM

## 2015-10-04 NOTE — Addendum Note (Signed)
Addended by: Prentiss Bells on: 10/04/2015 03:43 PM   Modules accepted: Medications

## 2015-10-04 NOTE — Telephone Encounter (Signed)
Appointments made and avs printed for patient °

## 2015-10-04 NOTE — Progress Notes (Signed)
Patient Care Team: Lucianne Lei, MD as PCP - General (Family Medicine) Lucianne Lei, MD as Attending Physician (Family Medicine) Bo Merino, MD as Attending Physician (Rheumatology) Frederico Hamman, MD as Consulting Physician (Obstetrics and Gynecology)  DIAGNOSIS: DCIS of lower outer quadrant.    Staging form: Breast, AJCC 7th Edition     Clinical: Stage 0 (Tis, N0, cM0) - Signed by Thea Silversmith, MD on 11/01/2011   SUMMARY OF ONCOLOGIC HISTORY:   DCIS of lower outer quadrant.    11/23/2011 Surgery Right breast lumpectomy: Residual foci of intermediate grade DCIS 0.3 cm 0.1 cm, 0/1 lymph node, grade 2, ER 100%, PR 100%   01/22/2012 - 02/29/2012 Radiation Therapy Adjuvant radiation therapy   03/22/2012 -  Anti-estrogen oral therapy Adjuvant tamoxifen 20 mg daily    CHIEF COMPLIANT: Follow-up on tamoxifen  INTERVAL HISTORY: Melissa Perez is a 69 year old with above-mentioned history of right breast DCIS currently on tamoxifen. She is tolerating tamoxifen moderately well. She complains of hot flashes. She actually knows exactly what triggers her hot flashes. Mostly the sugary stuff as well as moisturizers. Denies any lumps or nodules in the breasts.  REVIEW OF SYSTEMS:   Constitutional: Denies fevers, chills or abnormal weight loss Eyes: Denies blurriness of vision Ears, nose, mouth, throat, and face: Denies mucositis or sore throat Respiratory: Denies cough, dyspnea or wheezes Cardiovascular: Denies palpitation, chest discomfort or lower extremity swelling Gastrointestinal:  Denies nausea, heartburn or change in bowel habits Skin: Denies abnormal skin rashes Lymphatics: Denies new lymphadenopathy or easy bruising Neurological:Denies numbness, tingling or new weaknesses Behavioral/Psych: Mood is stable, no new changes  Breast:  denies any pain or lumps or nodules in either breasts All other systems were reviewed with the patient and are negative.  I have reviewed the past  medical history, past surgical history, social history and family history with the patient and they are unchanged from previous note.  ALLERGIES:  is allergic to dilaudid and hydrocodone.  MEDICATIONS:  Current Outpatient Prescriptions  Medication Sig Dispense Refill  . calcium carbonate (TUMS - DOSED IN MG ELEMENTAL CALCIUM) 500 MG chewable tablet Chew 1 tablet by mouth as needed.      . Cholecalciferol (VITAMIN D3) 1000 UNITS CAPS Take by mouth.    . folic acid (FOLVITE) 1 MG tablet Take 1 mg by mouth 2 (two) times daily.    . hydroxychloroquine (PLAQUENIL) 200 MG tablet 200 mg. 200mg  in the morning and 100mg  at night  1  . iron polysaccharides (NIFEREX 60) 40-20 MG capsule Take 1 capsule by mouth daily with breakfast.     . loratadine (CLARITIN) 10 MG tablet Take 10 mg by mouth daily as needed.     . methotrexate (RHEUMATREX) 2.5 MG tablet Take 10 tablets every week  1  . tamoxifen (NOLVADEX) 20 MG tablet TAKE 1 TABLET (20 MG TOTAL) BY MOUTH DAILY. 90 tablet 3  . UNABLE TO FIND 1 tablet daily. One a Day Women's 50+    . valACYclovir (VALTREX) 1000 MG tablet Take 1,000 mg by mouth 3 (three) times daily.  4  . valsartan-hydrochlorothiazide (DIOVAN-HCT) 80-12.5 MG per tablet Take 1 tablet by mouth as needed.     . VOLTAREN 1 % GEL   2   No current facility-administered medications for this visit.    PHYSICAL EXAMINATION: ECOG PERFORMANCE STATUS: 1 - Symptomatic but completely ambulatory  Filed Vitals:   10/04/15 0856  BP: 128/54  Pulse: 71  Temp: 98.2 F (36.8 C)  Resp: 18   Filed Weights   10/04/15 0856  Weight: 155 lb 8 oz (70.534 kg)    GENERAL:alert, no distress and comfortable SKIN: skin color, texture, turgor are normal, no rashes or significant lesions EYES: normal, Conjunctiva are pink and non-injected, sclera clear OROPHARYNX:no exudate, no erythema and lips, buccal mucosa, and tongue normal  NECK: supple, thyroid normal size, non-tender, without nodularity LYMPH:   no palpable lymphadenopathy in the cervical, axillary or inguinal LUNGS: clear to auscultation and percussion with normal breathing effort HEART: regular rate & rhythm and no murmurs and no lower extremity edema ABDOMEN:abdomen soft, non-tender and normal bowel sounds Musculoskeletal:no cyanosis of digits and no clubbing  NEURO: alert & oriented x 3 with fluent speech, no focal motor/sensory deficits BREAST: No palpable masses or nodules in either right or left breasts. Scars from prior breast surgery are noted. No palpable axillary supraclavicular or infraclavicular adenopathy no breast tenderness or nipple discharge. (exam performed in the presence of a chaperone)  LABORATORY DATA:  I have reviewed the data as listed   Chemistry      Component Value Date/Time   NA 143 07/02/2013 0922   NA 140 06/30/2012 0851   K 3.9 07/02/2013 0922   K 4.0 06/30/2012 0851   CL 103 12/31/2012 0850   CL 104 06/30/2012 0851   CO2 27 07/02/2013 0922   CO2 28 06/30/2012 0851   BUN 17.4 07/02/2013 0922   BUN 16 06/30/2012 0851   CREATININE 0.9 07/02/2013 0922   CREATININE 0.89 06/30/2012 0851      Component Value Date/Time   CALCIUM 9.5 07/02/2013 0922   CALCIUM 9.4 06/30/2012 0851   ALKPHOS 59 07/02/2013 0922   ALKPHOS 66 06/30/2012 0851   AST 24 07/02/2013 0922   AST 20 06/30/2012 0851   ALT 18 07/02/2013 0922   ALT 15 06/30/2012 0851   BILITOT 0.31 07/02/2013 0922   BILITOT 0.4 06/30/2012 0851       Lab Results  Component Value Date   WBC 6.2 07/02/2013   HGB 11.9 07/02/2013   HCT 35.1 07/02/2013   MCV 94.4 07/02/2013   PLT 216 07/02/2013   NEUTROABS 4.2 07/02/2013   ASSESSMENT & PLAN:  DCIS of lower outer quadrant.  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 Hot flashes that make it very uncomfortable for her especially at night.   Breast Cancer Surveillance: 1. Breast exam 11/1//16: Normal 2. Mammogram 01/24/15 No abnormalities. Postsurgical changes. Breast Density Category B. I recommended that she get 3-D mammograms for surveillance. Discussed the differences between different breast density categories.  follow-up in one year  No orders of the defined types were placed in this encounter.   The patient has a good understanding of the overall plan. she agrees with it. she will call with any problems that may develop before the next visit here.   Rulon Eisenmenger, MD 10/04/2015

## 2015-12-28 ENCOUNTER — Other Ambulatory Visit: Payer: Self-pay | Admitting: Hematology and Oncology

## 2015-12-28 DIAGNOSIS — Z853 Personal history of malignant neoplasm of breast: Secondary | ICD-10-CM

## 2016-01-26 ENCOUNTER — Ambulatory Visit
Admission: RE | Admit: 2016-01-26 | Discharge: 2016-01-26 | Disposition: A | Discharge status: Home / Self Care | Payer: Medicare Other | Source: Ambulatory Visit | Attending: Hematology and Oncology | Admitting: Hematology and Oncology

## 2016-01-26 DIAGNOSIS — Z853 Personal history of malignant neoplasm of breast: Secondary | ICD-10-CM

## 2016-06-22 LAB — CBC AND DIFFERENTIAL
HCT: 36 % (ref 36–46)
HEMOGLOBIN: 12.1 g/dL (ref 12.0–16.0)
Platelets: 213 10*3/uL (ref 150–399)
WBC: 8.8 10*3/mL

## 2016-06-22 LAB — BASIC METABOLIC PANEL
BUN: 17 mg/dL (ref 4–21)
CREATININE: 0.9 mg/dL (ref 0.5–1.1)
GLUCOSE: 87 mg/dL
POTASSIUM: 4 mmol/L (ref 3.4–5.3)
Sodium: 141 mmol/L (ref 137–147)

## 2016-06-22 LAB — HEPATIC FUNCTION PANEL
ALT: 16 U/L (ref 7–35)
AST: 24 U/L (ref 13–35)
Alkaline Phosphatase: 43 U/L (ref 25–125)
Bilirubin, Total: 0.5 mg/dL

## 2016-08-26 ENCOUNTER — Encounter: Payer: Self-pay | Admitting: *Deleted

## 2016-09-21 ENCOUNTER — Encounter: Payer: Self-pay | Admitting: Rheumatology

## 2016-09-24 ENCOUNTER — Telehealth: Payer: Self-pay | Admitting: Radiology

## 2016-09-24 NOTE — Telephone Encounter (Signed)
I have called patient to advise labs WNL / CBC and CMP

## 2016-10-03 NOTE — Assessment & Plan Note (Signed)
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 Hot flashes that make it very uncomfortable for her especially at night.   Breast Cancer Surveillance: 1. Breast exam 11/2//17: Normal 2. Mammogram 01/26/16 No abnormalities. Postsurgical changes. Breast Density Category B. I recommended that she get 3-D mammograms for surveillance. Discussed the differences between different breast density categories.  follow-up in one year

## 2016-10-04 ENCOUNTER — Ambulatory Visit (HOSPITAL_BASED_OUTPATIENT_CLINIC_OR_DEPARTMENT_OTHER): Payer: Medicare Other | Admitting: Hematology and Oncology

## 2016-10-04 ENCOUNTER — Telehealth: Payer: Self-pay | Admitting: Hematology and Oncology

## 2016-10-04 ENCOUNTER — Encounter: Payer: Self-pay | Admitting: Hematology and Oncology

## 2016-10-04 DIAGNOSIS — Z170 Estrogen receptor positive status [ER+]: Secondary | ICD-10-CM | POA: Diagnosis not present

## 2016-10-04 DIAGNOSIS — C50511 Malignant neoplasm of lower-outer quadrant of right female breast: Secondary | ICD-10-CM

## 2016-10-04 DIAGNOSIS — Z79810 Long term (current) use of selective estrogen receptor modulators (SERMs): Secondary | ICD-10-CM

## 2016-10-04 MED ORDER — TAMOXIFEN CITRATE 20 MG PO TABS: 20.0000 mg | ORAL_TABLET | Freq: Every day | ORAL | 0 refills | Status: DC

## 2016-10-04 NOTE — Progress Notes (Signed)
Patient Care Team: Lucianne Lei, MD as PCP - General (Family Medicine) Lucianne Lei, MD as Attending Physician (Family Medicine) Bo Merino, MD as Attending Physician (Rheumatology) Frederico Hamman, MD (Inactive) as Consulting Physician (Obstetrics and Gynecology)  DIAGNOSIS:  Encounter Diagnosis  Name Primary?  . 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 She is currently on adjuvant tamoxifen. She is tolerating tamoxifen therapy without any major problems or concerns. She denies any hot flashes or myalgias. She denies any lumps or nodules in the breast.  REVIEW OF SYSTEMS:   Constitutional: Denies fevers, chills or abnormal weight loss Eyes: Denies blurriness of vision Ears, nose, mouth, throat, and face: Denies mucositis or sore throat Respiratory: Denies cough, dyspnea or wheezes Cardiovascular: Denies palpitation, chest discomfort Gastrointestinal:  Denies nausea, heartburn or change in bowel habits Skin: Denies abnormal skin rashes Lymphatics: Denies new lymphadenopathy or easy bruising Neurological:Denies numbness, tingling or new weaknesses Behavioral/Psych: Mood is stable, no new changes  Extremities: No lower extremity edema Breast:  denies any pain or lumps or nodules in either breasts All other systems were reviewed with the  patient and are negative.  I have reviewed the past medical history, past surgical history, social history and family history with the patient and they are unchanged from previous note.  ALLERGIES:  is allergic to dilaudid [hydromorphone hcl] and hydrocodone.  MEDICATIONS:  Current Outpatient Prescriptions  Medication Sig Dispense Refill  . calcium carbonate (TUMS - DOSED IN MG ELEMENTAL CALCIUM) 500 MG chewable tablet Chew 1 tablet by mouth as needed.      . Cholecalciferol (VITAMIN D3) 1000 UNITS CAPS Take by mouth.    . folic acid (FOLVITE) 1 MG tablet Take 1 mg by mouth 2 (two) times daily.    . hydroxychloroquine (PLAQUENIL) 200 MG tablet 200 mg daily.   1  . iron polysaccharides (NIFEREX 60) 40-20 MG capsule Take 1 capsule by mouth daily with breakfast. Taking 65 mg    . loratadine (CLARITIN) 10 MG tablet Take 10 mg by mouth daily as needed.     . methotrexate (RHEUMATREX) 2.5 MG tablet Take 10 tablets every week  1  . tamoxifen (NOLVADEX) 20 MG tablet TAKE 1 TABLET (20 MG TOTAL) BY MOUTH DAILY. 90 tablet 3  . UNABLE TO FIND 1 tablet daily. One a Day Women's 50+    . valACYclovir (VALTREX) 1000 MG tablet Take 1,000 mg by mouth 2 (two) times daily.   4  . valsartan-hydrochlorothiazide (DIOVAN-HCT) 80-12.5 MG per tablet Take 1 tablet by mouth as needed.     . VOLTAREN 1 % GEL   2   No current facility-administered medications for this visit.     PHYSICAL EXAMINATION: ECOG PERFORMANCE STATUS: 0 - Asymptomatic  Vitals:   10/04/16 0922  BP: Marland Kitchen)  116/51  Pulse: 87  Resp: 17  Temp: 98.2 F (36.8 C)   Filed Weights   10/04/16 0922  Weight: 147 lb 1.6 oz (66.7 kg)    GENERAL:alert, no distress and comfortable SKIN: skin color, texture, turgor are normal, no rashes or significant lesions EYES: normal, Conjunctiva are pink and non-injected, sclera clear OROPHARYNX:no exudate, no erythema and lips, buccal mucosa, and tongue normal  NECK: supple, thyroid normal size, non-tender,  without nodularity LYMPH:  no palpable lymphadenopathy in the cervical, axillary or inguinal LUNGS: clear to auscultation and percussion with normal breathing effort HEART: regular rate & rhythm and no murmurs and no lower extremity edema ABDOMEN:abdomen soft, non-tender and normal bowel sounds MUSCULOSKELETAL:no cyanosis of digits and no clubbing  NEURO: alert & oriented x 3 with fluent speech, no focal motor/sensory deficits EXTREMITIES: No lower extremity edema BREAST: No palpable masses or nodules in either right or left breasts. No palpable axillary supraclavicular or infraclavicular adenopathy no breast tenderness or nipple discharge. (exam performed in the presence of a chaperone)  LABORATORY DATA:  I have reviewed the data as listed   Chemistry      Component Value Date/Time   NA 143 07/02/2013 0922   K 3.9 07/02/2013 0922   CL 103 12/31/2012 0850   CO2 27 07/02/2013 0922   BUN 17.4 07/02/2013 0922   CREATININE 0.9 07/02/2013 0922      Component Value Date/Time   CALCIUM 9.5 07/02/2013 0922   ALKPHOS 59 07/02/2013 0922   AST 24 07/02/2013 0922   ALT 18 07/02/2013 0922   BILITOT 0.31 07/02/2013 0922       Lab Results  Component Value Date   WBC 6.2 07/02/2013   HGB 11.9 07/02/2013   HCT 35.1 07/02/2013   MCV 94.4 07/02/2013   PLT 216 07/02/2013   NEUTROABS 4.2 07/02/2013   ASSESSMENT & PLAN:  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 Hot flashes that make it very uncomfortable for her especially at night. But since she only has 6 more months to take, she does not  want to change anything.  Breast Cancer Surveillance: 1. Breast exam 11/2//17: Normal 2. Mammogram 01/26/16 No abnormalities. Postsurgical changes. Breast Density Category B. I recommended that she get 3-D mammograms for surveillance. Discussed the differences between different breast density categories.  follow-up in one year   No orders of the defined types were placed in this encounter.  The patient has a good understanding of the overall plan. she agrees with it. she will call with any problems that may develop before the next visit here.   Rulon Eisenmenger, MD 10/04/16

## 2016-10-04 NOTE — Telephone Encounter (Signed)
Gave patient avs report and appointments for November 2018.  °

## 2016-10-15 DIAGNOSIS — Z79899 Other long term (current) drug therapy: Secondary | ICD-10-CM | POA: Insufficient documentation

## 2016-10-15 DIAGNOSIS — M0579 Rheumatoid arthritis with rheumatoid factor of multiple sites without organ or systems involvement: Secondary | ICD-10-CM | POA: Insufficient documentation

## 2016-10-15 DIAGNOSIS — G5760 Lesion of plantar nerve, unspecified lower limb: Secondary | ICD-10-CM | POA: Insufficient documentation

## 2016-10-15 DIAGNOSIS — R7611 Nonspecific reaction to tuberculin skin test without active tuberculosis: Secondary | ICD-10-CM | POA: Insufficient documentation

## 2016-10-15 NOTE — Progress Notes (Signed)
Office Visit Note  Patient: Melissa Perez             Date of Birth: [birth year]           MRN: [id number]             PCP: Elyn Peers, MD Referring: Lucianne Lei, MD Visit Date: 10/18/2016 Occupation: Retired    Subjective:  Right hip pain   History of Present Illness: Melissa Perez is a 70 y.o. right-handed female she reports that she's been having some right hip pain which has been going on for the last 3 months now. She denies any radiculopathy. She also has some discomfort in her neck. She denies any joint swelling. She's been tolerating her methotrexate well.   Activities of Daily Living:  Patient reports morning stiffness for 5 minutes.   Patient Denies nocturnal pain.  Difficulty dressing/grooming: Denies Difficulty climbing stairs: Denies Difficulty getting out of chair: Denies Difficulty using hands for taps, buttons, cutlery, and/or writing: Denies   Review of Systems  Constitutional: Positive for fatigue. Negative for night sweats, weight gain, weight loss and weakness.  HENT: Positive for mouth dryness. Negative for mouth sores, trouble swallowing, trouble swallowing and nose dryness.   Eyes: Negative for pain, redness, visual disturbance and dryness.  Respiratory: Negative for cough, shortness of breath and difficulty breathing.   Cardiovascular: Negative for chest pain, palpitations, hypertension, irregular heartbeat and swelling in legs/feet.  Gastrointestinal: Negative for blood in stool, constipation and diarrhea.  Endocrine: Negative for increased urination.  Genitourinary: Negative for vaginal dryness.  Musculoskeletal: Positive for arthralgias, joint pain, morning stiffness and muscle tenderness. Negative for joint swelling, myalgias, muscle weakness and myalgias.  Skin: Negative for color change, rash, hair loss, skin tightness, ulcers and sensitivity to sunlight.  Allergic/Immunologic: Negative for susceptible to infections.  Neurological: Negative for  dizziness, memory loss and night sweats.  Hematological: Negative for swollen glands.  Psychiatric/Behavioral: Positive for sleep disturbance. Negative for depressed mood. The patient is not nervous/anxious.     PMFS History:  Patient Active Problem List   Diagnosis Date Noted  . Rheumatoid arthritis  10/15/2016  . High risk medication use 10/15/2016  . 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 S/P lumpectomy of breast 12/03/2011  . 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 Arthritis    rheumatoid, take Humira  . Arthritis    rheumatoid  . Arthritis pain   . Breast cancer ([place])   . Cancer ([place])   . Hyperlipidemia   . Hypertension   . Rash   . Rheumatoid arthritis ([place])   . S/P lumpectomy of breast 12/03/2011    Family History  Problem Relation Age of Onset  . Breast cancer Mother   . Cancer Mother   . Kidney cancer Father   . Cancer Father   . Breast cancer Sister   . Cancer Sister   . Lung cancer Sister   . Colon cancer Maternal Uncle   . Liver cancer Paternal Aunt    Past Surgical History:  Procedure Laterality Date  . ABDOMINAL HYSTERECTOMY    . APPENDECTOMY    . BREAST SURGERY     2 left breast biopsies-benign  . left breast biopsies  1991 & 1990  . MASTECTOMY PARTIAL / LUMPECTOMY  12/12   Social History   Social History Narrative   Adult Son in the  [employer]     Objective: Vital Signs: BP (!) 118/57 (BP Location: Left Arm, Patient Position: Sitting, Cuff Size: Large)   Pulse (!) 101   Resp 14   Ht 5\' 1"  (1.549 m)   Wt 148 lb (67.1 kg)   BMI 27.96 kg/m    Physical Exam  Constitutional: She is oriented to person, place, and time. She appears well-developed and well-nourished.  HENT:  Head: Normocephalic and atraumatic.  Eyes: Conjunctivae and EOM are normal.  Neck: Normal range of motion.  Cardiovascular: Normal rate, regular rhythm,  normal heart sounds and intact distal pulses.   Pulmonary/Chest: Effort normal and breath sounds normal.  Abdominal: Soft. Bowel sounds are normal.  Lymphadenopathy:    She has no cervical adenopathy.  Neurological: She is alert and oriented to person, place, and time.  Skin: Skin is warm and dry. Capillary refill takes less than 2 seconds.  Psychiatric: She has a normal mood and affect. Her behavior is normal.  Nursing note and vitals reviewed.    Musculoskeletal Exam: C-spine, thoracic, lumbar spine good range of motion. She had tenderness on palpation over right SI joint. Shoulder joints, elbow joints, wrist joints, MCPs, PIPs DIPs with good range of motion with no synovitis. Hip joints, knee joints, ankle joints, MTPs were good range of motion with no synovitis.  CDAI Exam: CDAI Homunculus Exam:   Joint Counts:  CDAI Tender Joint count: 0 CDAI Swollen Joint count: 0  Global Assessments:  Patient Global Assessment: 2 Provider Global Assessment: 2  CDAI Calculated Score: 4    Investigation: Findings:  06/22/2016 CBC normal, CMP normal 09/21/2016 CMP normal, CBC normal    Imaging: No results found.  Speciality Comments: No specialty comments available.    Procedures:  Large Joint Inj Date/Time: 10/18/2016 9:20 AM Performed by: Bo Merino Authorized by: Bo Merino   Consent Given by:  Patient Site marked: the procedure site was marked   Timeout: prior to procedure the correct patient, procedure, and site was verified   Indications:  Pain Location:  Sacroiliac Site:  R sacroiliac joint Prep: patient was prepped and draped in usual sterile fashion   Needle Size:  27 G Needle Length:  1.5 inches Approach:  Superior Ultrasound Guidance: No   Fluoroscopic Guidance: No   Arthrogram: No   Medications:  1 mL lidocaine 1 %; 40 mg triamcinolone acetonide 40 MG/ML Aspiration Attempted: Yes   Aspirate amount (mL):  0 Patient tolerance:  Patient  tolerated the procedure well with no immediate complications  PATIENT TOLERATED PROCEDURE WELL. THERE WERE NO COMPLICATIONS.     Allergies: Dilaudid [hydromorphone hcl] and Hydrocodone   Assessment / Plan: Visit Diagnoses: Rheumatoid arthritis  - +RF,+CCP, she has no synovitis on examination today she's been tolerating her medications well.  Right SI joint pain: She describes it to be moderate to severe and she's having difficulty walking. After different treatment options were discussed and informed consent was obtained right SI joint was injected with cortisone. I've advised her to monitor her blood pressure .  High risk medication use - 0000000 tabs/wk,Folic acid 2mg  qd and Plaquenil. Her labs are stable and normal which were discussed. We will continue to monitor her labs every 3 months. I also discussed with her that she may be able to taper Plaquenil and see how she does.  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  She has multiple other medical problems which are listed as follows:  Positive PPD, treated - +INH  Malignant neoplasm of  right female breast - Stage0,s/p RTx11/12,lumpectomy  Essential hypertension    Orders: Orders Placed This Encounter  Procedures  . Large Joint Injection/Arthrocentesis  . Large Joint Injection/Arthrocentesis  . CBC with Differential/Platelet  . COMPLETE METABOLIC PANEL WITH GFR   Meds ordered this encounter  Medications  . diclofenac sodium (VOLTAREN) 1 % GEL    Sig: Apply 2 g topically 3 (three) times daily.    Dispense:  200 g    Refill:  2  . methotrexate (RHEUMATREX) 2.5 MG tablet    Sig: Take 10 tablets (25 mg total) by mouth once a week. Take 10 tablets every week    Dispense:  120 tablet    Refill:  0    Face-to-face time spent with patient was 30 minutes. 50% of time was spent in counseling and coordination of care.  Follow-Up Instructions: Return in about 5 months (around 03/18/2017) for Rheumatoid  arthritis.   Bo Merino, MD

## 2016-10-17 ENCOUNTER — Encounter: Payer: Self-pay | Admitting: *Deleted

## 2016-10-18 ENCOUNTER — Encounter: Payer: Self-pay | Admitting: Rheumatology

## 2016-10-18 ENCOUNTER — Ambulatory Visit (INDEPENDENT_AMBULATORY_CARE_PROVIDER_SITE_OTHER): Payer: Medicare Other | Admitting: Rheumatology

## 2016-10-18 VITALS — BP 118/57 | HR 101 | Resp 14 | Ht 61.0 in | Wt 148.0 lb

## 2016-10-18 DIAGNOSIS — M25551 Pain in right hip: Secondary | ICD-10-CM | POA: Diagnosis not present

## 2016-10-18 DIAGNOSIS — I10 Essential (primary) hypertension: Secondary | ICD-10-CM | POA: Diagnosis not present

## 2016-10-18 DIAGNOSIS — G8929 Other chronic pain: Secondary | ICD-10-CM | POA: Diagnosis not present

## 2016-10-18 DIAGNOSIS — M0579 Rheumatoid arthritis with rheumatoid factor of multiple sites without organ or systems involvement: Secondary | ICD-10-CM

## 2016-10-18 DIAGNOSIS — C50911 Malignant neoplasm of unspecified site of right female breast: Secondary | ICD-10-CM | POA: Diagnosis not present

## 2016-10-18 DIAGNOSIS — Z872 Personal history of diseases of the skin and subcutaneous tissue: Secondary | ICD-10-CM

## 2016-10-18 DIAGNOSIS — C50511 Malignant neoplasm of lower-outer quadrant of right female breast: Secondary | ICD-10-CM | POA: Diagnosis not present

## 2016-10-18 DIAGNOSIS — M533 Sacrococcygeal disorders, not elsewhere classified: Secondary | ICD-10-CM

## 2016-10-18 DIAGNOSIS — G5760 Lesion of plantar nerve, unspecified lower limb: Secondary | ICD-10-CM | POA: Diagnosis not present

## 2016-10-18 DIAGNOSIS — R7611 Nonspecific reaction to tuberculin skin test without active tuberculosis: Secondary | ICD-10-CM | POA: Diagnosis not present

## 2016-10-18 DIAGNOSIS — Z79899 Other long term (current) drug therapy: Secondary | ICD-10-CM

## 2016-10-18 MED ORDER — METHOTREXATE 2.5 MG PO TABS: 25.0000 mg | ORAL_TABLET | ORAL | 0 refills | Status: DC

## 2016-10-18 MED ORDER — DICLOFENAC SODIUM 1 % TD GEL: 2.0000 g | Freq: Three times a day (TID) | TRANSDERMAL | 2 refills | Status: DC

## 2016-10-18 MED ORDER — TRIAMCINOLONE ACETONIDE 40 MG/ML IJ SUSP
40.0000 mg | INTRAMUSCULAR | Status: AC | PRN
  Administered 2016-10-18: 40 mg via INTRA_ARTICULAR

## 2016-10-18 MED ORDER — LIDOCAINE HCL 1 % IJ SOLN
1.0000 mL | INTRAMUSCULAR | Status: AC | PRN
  Administered 2016-10-18: 1 mL

## 2016-10-18 NOTE — Progress Notes (Signed)
Rheumatology Medication Review by a Pharmacist Does the patient feel that his/her medications are working for him/her?  Yes Has the patient been experiencing any side effects to the medications prescribed?  No Does the patient have any problems obtaining medications?  No  Issues to address at subsequent visits: None   Pharmacist comments:  Mrs. Stavropoulos is a pleasant 70 yo F who presents to clinic for follow up on her Rheumatoid Arthritis (Rheumatoid Factor positive, CCP positive).  Patient is currently taking methotrexate 10 tablets per week and hydroxychloroquine 200 mg daily.  Patient had standing labs in October (CBC, CMP) which were normal.  Labs were uploaded into her chart.  She also had hydroxychloroquine eye exam on 07/16/16.  Patient will be due for standing labs again in January 2018.  Placed orders for standing labs.  Patient denied any medication-related questions at this time.     Elisabeth Most, Pharm.D., BCPS Clinical Pharmacist Pager: [phone] Phone: [phone] 10/18/2016 8:52 AM

## 2016-10-18 NOTE — Patient Instructions (Signed)
Standing Labs We placed an order today for your standing lab work.    Please come back and get your standing labs in January 2018 and every 3 months.    We have open lab Monday through Friday from 8:30-11:30 AM and 1-4 PM at the office of Dr. Shaili Deveshwar/Naitik Panwala, PA.   The office is located at [address], [address], [place], [place] [address] No appointment is necessary.   Labs are drawn by [facility].  You may receive a bill from [facility] for your lab work.     

## 2016-10-22 ENCOUNTER — Telehealth: Payer: Self-pay | Admitting: Radiology

## 2016-10-22 NOTE — Telephone Encounter (Signed)
request received via fax for PA for V gel  [facility]

## 2016-10-29 ENCOUNTER — Telehealth: Payer: Self-pay | Admitting: Rheumatology

## 2016-10-29 NOTE — Telephone Encounter (Signed)
Patient states she is having trouble getting the voltaren gel rx filled. The Pharmacy told her that her insurance company has denied it and she would like to know what the next step is or if there is something that can be done so she can get the rx. Patient uses [facility] on [address].

## 2016-10-29 NOTE — Telephone Encounter (Signed)
Many insurance companies are not willing to pay for this medication.As a result, it's best for patient to use coupon from good [employer] and go to [facility] and get 3 tubes of $61 or 1 tube  for  $27This is the patient's choice if she wants to buy the medication from her own pocket.  If so, okay to prescribe Voltaren gelApply 3 g to 3 large joints up to 3 times a day when necessaryDispensed 3 tubes with 3 refills  Here is couponhttps://www.goodrx.com/voltaren-gel?drug-name=voltaren+gel&form=tube-of-gel&dosage=100g-of-1%25&quantity=3&days_supply=&label_override=diclofenac%20sodium

## 2016-10-29 NOTE — Telephone Encounter (Signed)
I have advised patient 

## 2016-10-30 NOTE — Telephone Encounter (Signed)
PA request completed via cover my meds

## 2016-12-24 ENCOUNTER — Other Ambulatory Visit: Payer: Self-pay | Admitting: Rheumatology

## 2016-12-24 ENCOUNTER — Telehealth: Payer: Self-pay | Admitting: Rheumatology

## 2016-12-24 DIAGNOSIS — Z79899 Other long term (current) drug therapy: Secondary | ICD-10-CM

## 2016-12-24 LAB — CBC WITH DIFFERENTIAL/PLATELET
BASOS PCT: 0 %
Basophils Absolute: 0 cells/uL (ref 0–200)
EOS PCT: 0 %
Eosinophils Absolute: 0 cells/uL — ABNORMAL LOW (ref 15–500)
HCT: 36 % (ref 35.0–45.0)
Hemoglobin: 11.7 g/dL (ref 11.7–15.5)
LYMPHS PCT: 21 %
Lymphs Abs: 1512 cells/uL (ref 850–3900)
MCH: 32.6 pg (ref 27.0–33.0)
MCHC: 32.5 g/dL (ref 32.0–36.0)
MCV: 100.3 fL — AB (ref 80.0–100.0)
MONOS PCT: 7 %
MPV: 9.8 fL (ref 7.5–12.5)
Monocytes Absolute: 504 cells/uL (ref 200–950)
NEUTROS ABS: 5184 {cells}/uL (ref 1500–7800)
Neutrophils Relative %: 72 %
PLATELETS: 248 10*3/uL (ref 140–400)
RBC: 3.59 MIL/uL — AB (ref 3.80–5.10)
RDW: 15.2 % — AB (ref 11.0–15.0)
WBC: 7.2 10*3/uL (ref 3.8–10.8)

## 2016-12-24 LAB — COMPLETE METABOLIC PANEL WITH GFR
ALT: 16 U/L (ref 6–29)
AST: 22 U/L (ref 10–35)
Albumin: 4.2 g/dL (ref 3.6–5.1)
Alkaline Phosphatase: 46 U/L (ref 33–130)
BILIRUBIN TOTAL: 0.4 mg/dL (ref 0.2–1.2)
BUN: 16 mg/dL (ref 7–25)
CALCIUM: 9.6 mg/dL (ref 8.6–10.4)
CO2: 25 mmol/L (ref 20–31)
CREATININE: 0.84 mg/dL (ref 0.50–0.99)
Chloride: 102 mmol/L (ref 98–110)
GFR, EST AFRICAN AMERICAN: 83 mL/min (ref >=60)
GFR, Est Non African American: 72 mL/min (ref >=60)
Glucose, Bld: 92 mg/dL (ref 65–99)
Potassium: 4.2 mmol/L (ref 3.5–5.3)
Sodium: 138 mmol/L (ref 135–146)
TOTAL PROTEIN: 6.5 g/dL (ref 6.1–8.1)

## 2016-12-24 NOTE — Telephone Encounter (Signed)
Patient had labs drawn today and her standing order has expired. Please send order to [facility].

## 2016-12-24 NOTE — Telephone Encounter (Signed)
Order's released

## 2016-12-25 NOTE — Progress Notes (Signed)
Labs normal.

## 2016-12-30 ENCOUNTER — Other Ambulatory Visit: Payer: Self-pay | Admitting: Hematology and Oncology

## 2016-12-31 ENCOUNTER — Other Ambulatory Visit: Payer: Self-pay | Admitting: Hematology and Oncology

## 2016-12-31 DIAGNOSIS — Z853 Personal history of malignant neoplasm of breast: Secondary | ICD-10-CM

## 2017-01-04 ENCOUNTER — Other Ambulatory Visit: Payer: Self-pay | Admitting: Rheumatology

## 2017-01-04 NOTE — Telephone Encounter (Signed)
Last Visit: 10/18/16 Next Visit: 03/22/17 Labs: 12/24/16  Okay to refill MTX?

## 2017-01-28 ENCOUNTER — Ambulatory Visit
Admission: RE | Admit: 2017-01-28 | Discharge: 2017-01-28 | Disposition: A | Discharge status: Home / Self Care | Payer: Medicare Other | Source: Ambulatory Visit | Attending: Hematology and Oncology | Admitting: Hematology and Oncology

## 2017-01-28 DIAGNOSIS — Z853 Personal history of malignant neoplasm of breast: Secondary | ICD-10-CM

## 2017-03-05 DIAGNOSIS — L309 Dermatitis, unspecified: Secondary | ICD-10-CM | POA: Insufficient documentation

## 2017-03-19 DIAGNOSIS — I10 Essential (primary) hypertension: Secondary | ICD-10-CM | POA: Insufficient documentation

## 2017-03-19 DIAGNOSIS — Z872 Personal history of diseases of the skin and subcutaneous tissue: Secondary | ICD-10-CM | POA: Insufficient documentation

## 2017-03-19 DIAGNOSIS — M533 Sacrococcygeal disorders, not elsewhere classified: Secondary | ICD-10-CM

## 2017-03-19 DIAGNOSIS — G8929 Other chronic pain: Secondary | ICD-10-CM | POA: Insufficient documentation

## 2017-03-19 DIAGNOSIS — Z853 Personal history of malignant neoplasm of breast: Secondary | ICD-10-CM | POA: Insufficient documentation

## 2017-03-19 NOTE — Progress Notes (Signed)
Office Visit Note  Patient: Melissa Perez             Date of Birth: June 29, 1946           MRN: [id number]             PCP: Elyn Peers, MD Referring: Lucianne Lei, MD Visit Date: 03/22/2017 Occupation: _0 @    Subjective: Pain hands   History of Present Illness: Melissa Perez is a 71 y.o. female  Last visit was 10/18/2016 and 06/22/2016 (in [facility]). Patient is doing well with her rheumatoid arthritis. She is on methotrexate, 10 pills per week and folic acid, 2 mg daily. At the last visit, she was advised by Dr. Estanislado Pandy to stop the Plaquenil which she did.. She has not had any flare or problems after stopping the Plaquenil.  Her lab work was also done recently and everything was within normal limits.  Her only complaint is that for her left hand she is having pain to the left fourth metacarpal head radiating proximally to the wrist. This pain occurs when she is holding something in her hand. He could be a broom, etc. There is no pain when she is not grasping anything. The symptoms have been occurring for the last 1 month. She rates that discomfort is about a 2 on a scale of 0-10.  She does not have any associated swelling stiffness or pain. Morning stiffness is about 5 mins if any at all.  Activities of Daily Living:  Patient reports morning stiffness for 0-5 mins minutes.   Patient Denies nocturnal pain.  Difficulty dressing/grooming: Denies Difficulty climbing stairs: Denies Difficulty getting out of chair: Denies Difficulty using hands for taps, buttons, cutlery, and/or writing: Reports   Review of Systems  Constitutional: Negative for fatigue.  HENT: Negative for mouth sores and mouth dryness.   Eyes: Negative for dryness.  Respiratory: Negative for shortness of breath.   Gastrointestinal: Negative for constipation and diarrhea.  Musculoskeletal: Negative for myalgias and myalgias.  Skin: Negative for sensitivity to sunlight.  Psychiatric/Behavioral: Negative for  decreased concentration and sleep disturbance.    PMFS History:  Patient Active Problem List   Diagnosis Date Noted  . Essential hypertension 03/19/2017  . History of pityriasis rosea 03/19/2017  . 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 History of malignant neoplasm of breast 03/19/2017  . Rheumatoid arthritis  10/15/2016  . High risk medication use 10/15/2016  . 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 S/P lumpectomy of breast 12/03/2011  . 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 Arthritis    rheumatoid, take Humira  . Arthritis    rheumatoid  . Arthritis pain   . Breast cancer ([place])   . Cancer ([place])   . Hyperlipidemia   . Hypertension   . Rash   . Rheumatoid arthritis ([place])   . S/P lumpectomy of breast 12/03/2011    Family History  Problem Relation Age of Onset  . Breast cancer Mother   . Cancer Mother   . Kidney cancer Father   . Cancer Father   . Breast cancer Sister   . Cancer Sister   . Lung cancer Sister   . Colon cancer Maternal Uncle   . Liver cancer Paternal Aunt    Past Surgical History:  Procedure Laterality Date  . ABDOMINAL HYSTERECTOMY    . APPENDECTOMY    .  BREAST SURGERY     2 left breast biopsies-benign  . left breast biopsies  1991 & 1990  . MASTECTOMY PARTIAL / LUMPECTOMY  12/12   Social History   Social History Narrative   Adult Son in the [place]     Objective: Vital Signs: BP (!) 115/52 (BP Location: Left Arm, Patient Position: Sitting, Cuff Size: Normal)   Pulse 83   Resp 15   Ht 5' 1.75" (1.568 m)   Wt 151 lb (68.5 kg)   BMI 27.84 kg/m    Physical Exam  Constitutional: She is oriented to person, place, and time. She appears well-developed and well-nourished.  HENT:  Head: Normocephalic and atraumatic.  Eyes: EOM are normal. Pupils are equal, round, and reactive to light.  Cardiovascular: Normal rate,  regular rhythm and normal heart sounds.  Exam reveals no gallop and no friction rub.   No murmur heard. Pulmonary/Chest: Effort normal and breath sounds normal. She has no wheezes. She has no rales.  Abdominal: Soft. Bowel sounds are normal. She exhibits no distension. There is no tenderness. There is no guarding. No hernia.  Musculoskeletal: Normal range of motion. She exhibits no edema, tenderness or deformity.  Lymphadenopathy:    She has no cervical adenopathy.  Neurological: She is alert and oriented to person, place, and time. Coordination normal.  Skin: Skin is warm and dry. Capillary refill takes less than 2 seconds. No rash noted.  Psychiatric: She has a normal mood and affect. Her behavior is normal.  Nursing note and vitals reviewed.    Musculoskeletal Exam:  Full range of motion of all joints Grip strength is equal and strong bilaterally Fibromyalgia tender points are all absent  CDAI Exam: No CDAI exam completed.   No synovitis on examination  Investigation: Findings:  06/22/2016 CBC normal, CMP normal 09/21/2016 CMP normal, CBC normal   Orders Only on 03/21/2017  Component Date Value Ref Range Status  . Sodium 03/21/2017 140  135 - 146 mmol/L Final  . Potassium 03/21/2017 4.2  3.5 - 5.3 mmol/L Final  . Chloride 03/21/2017 104  98 - 110 mmol/L Final  . CO2 03/21/2017 30  20 - 31 mmol/L Final  . Glucose, Bld 03/21/2017 85  65 - 99 mg/dL Final  . BUN 03/21/2017 13  7 - 25 mg/dL Final  . Creat 03/21/2017 0.87  0.50 - 0.99 mg/dL Final   Comment:   For patients > or = 69 years of age: The upper reference limit for Creatinine is approximately 13% higher for people identified as African-American.     . Total Bilirubin 03/21/2017 0.5  0.2 - 1.2 mg/dL Final  . Alkaline Phosphatase 03/21/2017 47  33 - 130 U/L Final  . AST 03/21/2017 29  10 - 35 U/L Final  . ALT 03/21/2017 21  6 - 29 U/L Final  . Total Protein 03/21/2017 6.2  6.1 - 8.1 g/dL Final  . Albumin 03/21/2017  4.1  3.6 - 5.1 g/dL Final  . Calcium 03/21/2017 9.6  8.6 - 10.4 mg/dL Final  . GFR, Est African American 03/21/2017 79  >=60 mL/min Final  . GFR, Est Non African American 03/21/2017 68  >=60 mL/min Final  . WBC 03/21/2017 5.8  3.8 - 10.8 K/uL Final  . RBC 03/21/2017 3.69* 3.80 - 5.10 MIL/uL Final  . Hemoglobin 03/21/2017 12.1  11.7 - 15.5 g/dL Final  . HCT 03/21/2017 37.0  35.0 - 45.0 % Final  . MCV 03/21/2017 100.3* 80.0 - 100.0 fL Final  .  MCH 03/21/2017 32.8  27.0 - 33.0 pg Final  . MCHC 03/21/2017 32.7  32.0 - 36.0 g/dL Final  . RDW 03/21/2017 15.0  11.0 - 15.0 % Final  . Platelets 03/21/2017 222  140 - 400 K/uL Final  . MPV 03/21/2017 10.0  7.5 - 12.5 fL Final  . Neutro Abs 03/21/2017 3306  1,500 - 7,800 cells/uL Final  . Lymphs Abs 03/21/2017 1856  850 - 3,900 cells/uL Final  . Monocytes Absolute 03/21/2017 522  200 - 950 cells/uL Final  . Eosinophils Absolute 03/21/2017 58  15 - 500 cells/uL Final  . Basophils Absolute 03/21/2017 58  0 - 200 cells/uL Final  . Neutrophils Relative % 03/21/2017 57  % Final  . Lymphocytes Relative 03/21/2017 32  % Final  . Monocytes Relative 03/21/2017 9  % Final  . Eosinophils Relative 03/21/2017 1  % Final  . Basophils Relative 03/21/2017 1  % Final  . Smear Review 03/21/2017 Criteria for review not met   Final        Imaging: No results found.  Speciality Comments: No specialty comments available.    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 Positive rheumatoid factor and positive CCP. No synovitis on examination today. Doing well. On methotrexate 10 tablets every week and folic acid 2 mg daily. On the last visit she was requested to stop the Plaquenil 200 mg daily. She complied and has not been on the medication since December 2017. Has not flared. Doing well. Only complaint that she has at this time she is having some minor discomfort when she is grasping an item with her left hand starting at her fourth MCP joint and radiating proximally to her left wrist. The pain is intermittent and described as 2 on a scale of 0-10 and only occurs when she is grasping something.  #2: High risk prescription. Labs are within normal limits. They're up-to-date and were done on 03/21/2017. Patient is on methotrexate 2.5 mg; 10 mg every week and folic acid 2 mg daily. Patient stop the Plaquenil per our request and doing well without it. Currently: Adequate response.  #3: Left hand pain. (dr. D examined it and dx'd as flexor tendonitis at 4th mcp jt)(advised to use voltaren gel to the site) Not consistent with rheumatoid arthritis. More consistent with loss of fat pad to the left palmar aspect.   #4: Bilateral trapezius muscle spasms. Patient will use Voltaren gel. She has adequate amount at home and she will rub about 3 g to bilateral trapezius muscles up to 3 times a day when necessary.  #5: Return to clinic in 5 months  #6: Handout on neck exercise handout exercise and the next labs have been provided to the patient.  #7: CBC with differential and CMP with GFR due in 3 months which will be July 2018 and then every 3 months thereafter. She has standing orders  Orders: No orders of the defined types were placed in this encounter.  No orders of the defined types were placed in this  encounter.   Face-to-face time spent with patient was 30 minutes. 50% of time was spent in counseling and coordination of care.  Follow-Up Instructions: No Follow-up on file.   Eliezer Lofts, PA-C I examined and evaluated the patient with Eliezer Lofts PA. The plan of care was discussed as noted above.  Bo Merino, MD Note - This record has been created using Editor, commissioning.  Chart creation errors have been sought, but may not always  have been located. Such creation errors do not reflect on  the standard of medical care.

## 2017-03-21 ENCOUNTER — Other Ambulatory Visit: Payer: Self-pay | Admitting: Rheumatology

## 2017-03-21 LAB — CBC WITH DIFFERENTIAL/PLATELET
BASOS ABS: 58 {cells}/uL (ref 0–200)
Basophils Relative: 1 %
EOS PCT: 1 %
Eosinophils Absolute: 58 cells/uL (ref 15–500)
HEMATOCRIT: 37 % (ref 35.0–45.0)
HEMOGLOBIN: 12.1 g/dL (ref 11.7–15.5)
LYMPHS PCT: 32 %
Lymphs Abs: 1856 cells/uL (ref 850–3900)
MCH: 32.8 pg (ref 27.0–33.0)
MCHC: 32.7 g/dL (ref 32.0–36.0)
MCV: 100.3 fL — ABNORMAL HIGH (ref 80.0–100.0)
MPV: 10 fL (ref 7.5–12.5)
Monocytes Absolute: 522 cells/uL (ref 200–950)
Monocytes Relative: 9 %
Neutro Abs: 3306 cells/uL (ref 1500–7800)
Neutrophils Relative %: 57 %
Platelets: 222 10*3/uL (ref 140–400)
RBC: 3.69 MIL/uL — AB (ref 3.80–5.10)
RDW: 15 % (ref 11.0–15.0)
WBC: 5.8 10*3/uL (ref 3.8–10.8)

## 2017-03-21 LAB — COMPLETE METABOLIC PANEL WITH GFR
ALBUMIN: 4.1 g/dL (ref 3.6–5.1)
ALK PHOS: 47 U/L (ref 33–130)
ALT: 21 U/L (ref 6–29)
AST: 29 U/L (ref 10–35)
BUN: 13 mg/dL (ref 7–25)
CALCIUM: 9.6 mg/dL (ref 8.6–10.4)
CO2: 30 mmol/L (ref 20–31)
CREATININE: 0.87 mg/dL (ref 0.50–0.99)
Chloride: 104 mmol/L (ref 98–110)
GFR, Est African American: 79 mL/min (ref >=60)
GFR, Est Non African American: 68 mL/min (ref >=60)
GLUCOSE: 85 mg/dL (ref 65–99)
Potassium: 4.2 mmol/L (ref 3.5–5.3)
SODIUM: 140 mmol/L (ref 135–146)
TOTAL PROTEIN: 6.2 g/dL (ref 6.1–8.1)
Total Bilirubin: 0.5 mg/dL (ref 0.2–1.2)

## 2017-03-21 NOTE — Progress Notes (Signed)
WNL

## 2017-03-22 ENCOUNTER — Encounter: Payer: Self-pay | Admitting: Rheumatology

## 2017-03-22 ENCOUNTER — Ambulatory Visit (INDEPENDENT_AMBULATORY_CARE_PROVIDER_SITE_OTHER): Payer: Medicare Other | Admitting: Rheumatology

## 2017-03-22 VITALS — BP 115/52 | HR 83 | Resp 15 | Ht 61.75 in | Wt 151.0 lb

## 2017-03-22 DIAGNOSIS — G8929 Other chronic pain: Secondary | ICD-10-CM | POA: Diagnosis not present

## 2017-03-22 DIAGNOSIS — I10 Essential (primary) hypertension: Secondary | ICD-10-CM

## 2017-03-22 DIAGNOSIS — M533 Sacrococcygeal disorders, not elsewhere classified: Secondary | ICD-10-CM

## 2017-03-22 DIAGNOSIS — G5760 Lesion of plantar nerve, unspecified lower limb: Secondary | ICD-10-CM

## 2017-03-22 DIAGNOSIS — R7611 Nonspecific reaction to tuberculin skin test without active tuberculosis: Secondary | ICD-10-CM | POA: Diagnosis not present

## 2017-03-22 DIAGNOSIS — M0579 Rheumatoid arthritis with rheumatoid factor of multiple sites without organ or systems involvement: Secondary | ICD-10-CM

## 2017-03-22 DIAGNOSIS — C50511 Malignant neoplasm of lower-outer quadrant of right female breast: Secondary | ICD-10-CM | POA: Diagnosis not present

## 2017-03-22 DIAGNOSIS — Z872 Personal history of diseases of the skin and subcutaneous tissue: Secondary | ICD-10-CM | POA: Diagnosis not present

## 2017-03-22 DIAGNOSIS — Z79899 Other long term (current) drug therapy: Secondary | ICD-10-CM

## 2017-03-22 DIAGNOSIS — Z853 Personal history of malignant neoplasm of breast: Secondary | ICD-10-CM

## 2017-03-22 NOTE — Patient Instructions (Addendum)
Standing Labs We placed an order today for your standing lab work.    Please come back and get your standing labs in 3 months ( starting July 2018 and then every 3 months after that).  We have open lab Monday through Friday from 8:30-11:30 AM and 1:30-4 PM at the office of Dr. Tresa Moore, PA.   The office is located at [address], [place], [place], [place] [address] No appointment is necessary.   Labs are drawn by [facility].  You may receive a bill from [facility] for your lab work.     ========================  Neck Exercises Neck exercises can be important for many reasons:  They can help you to improve and maintain flexibility in your neck. This can be especially important as you age.  They can help to make your neck stronger. This can make movement easier.  They can reduce or prevent neck pain.  They may help your upper back. Ask your health care provider which neck exercises would be best for you. Exercises Neck Press  Repeat this exercise 10 times. Do it first thing in the morning and right before bed or as told by your health care provider. 1. Lie on your back on a firm bed or on the floor with a pillow under your head. 2. Use your neck muscles to push your head down on the pillow and straighten your spine. 3. Hold the position as well as you can. Keep your head facing up and your chin tucked. 4. Slowly count to 5 while holding this position. 5. Relax for a few seconds. Then repeat. Isometric Strengthening  Do a full set of these exercises 2 times a day or as told by your health care provider. 1. Sit in a supportive chair and place your hand on your forehead. 2. Push forward with your head and neck while pushing back with your hand. Hold for 10 seconds. 3. Relax. Then repeat the exercise 3 times. 4. Next, do thesequence again, this time putting your hand against the back of your head. Use your head and neck to push backward against the hand  pressure. 5. Finally, do the same exercise on either side of your head, pushing sideways against the pressure of your hand. Prone Head Lifts  Repeat this exercise 5 times. Do this 2 times a day or as told by your health care provider. 1. Lie face-down, resting on your elbows so that your chest and upper back are raised. 2. Start with your head facing downward, near your chest. Position your chin either on or near your chest. 3. Slowly lift your head upward. Lift until you are looking straight ahead. Then continue lifting your head as far back as you can stretch. 4. Hold your head up for 5 seconds. Then slowly lower it to your starting position. Supine Head Lifts  Repeat this exercise 8-10 times. Do this 2 times a day or as told by your health care provider. 1. Lie on your back, bending your knees to point to the ceiling and keeping your feet flat on the floor. 2. Lift your head slowly off the floor, raising your chin toward your chest. 3. Hold for 5 seconds. 4. Relax and repeat. Scapular Retraction  Repeat this exercise 5 times. Do this 2 times a day or as told by your health care provider. 1. Stand with your arms at your sides. Look straight ahead. 2. Slowly pull both shoulders backward and downward until you feel a stretch between your  shoulder blades in your upper back. 3. Hold for 10-30 seconds. 4. Relax and repeat. Contact a health care provider if:  Your neck pain or discomfort gets much worse when you do an exercise.  Your neck pain or discomfort does not improve within 2 hours after you exercise. If you have any of these problems, stop exercising right away. Do not do the exercises again unless your health care provider says that you can. Get help right away if:  You develop sudden, severe neck pain. If this happens, stop exercising right away. Do not do the exercises again unless your health care provider says that you can. Exercises Neck Stretch  Repeat this exercise 3-5  times. 1. Do this exercise while standing or while sitting in a chair. 2. Place your feet flat on the floor, shoulder-width apart. 3. Slowly turn your head to the right. Turn it all the way to the right so you can look over your right shoulder. Do not tilt or tip your head. 4. Hold this position for 10-30 seconds. 5. Slowly turn your head to the left, to look over your left shoulder. 6. Hold this position for 10-30 seconds. Neck Retraction Repeat this exercise 8-10 times. Do this 3-4 times a day or as told by your health care provider. 1. Do this exercise while standing or while sitting in a sturdy chair. 2. Look straight ahead. Do not bend your neck. 3. Use your fingers to push your chin backward. Do not bend your neck for this movement. Continue to face straight ahead. If you are doing the exercise properly, you will feel a slight sensation in your throat and a stretch at the back of your neck. 4. Hold the stretch for 1-2 seconds. Relax and repeat. This information is not intended to replace advice given to you by your health care provider. Make sure you discuss any questions you have with your health care provider. Document Released: 10/31/2015 Document Revised: 04/26/2016 Document Reviewed: 05/30/2015 Elsevier Interactive Patient Education  2017 [employer] Inc[employer]   =================================  Hand Exercises Hand exercises can be helpful to almost anyone. These exercises can strengthen the hands, improve flexibility and movement, and increase blood flow to the hands. These results can make work and daily tasks easier. Hand exercises can be especially helpful for people who have joint pain from arthritis or have nerve damage from overuse (carpal tunnel syndrome). These exercises can also help people who have injured a hand. Most of these hand exercises are fairly gentle stretching routines. You can do them often throughout the day. Still, it is a good idea to ask your health care provider  which exercises would be best for you. Warming your hands before exercise may help to reduce stiffness. You can do this with gentle massage or by placing your hands in warm water for 15 minutes. Also, make sure you pay attention to your level of hand pain as you begin an exercise routine. Exercises Knuckle Bend  Repeat this exercise 5-10 times with each hand. 1. Stand or sit with your arm, hand, and all five fingers pointed straight up. Make sure your wrist is straight. 2. Gently and slowly bend your fingers down and inward until the tips of your fingers are touching the tops of your palm. 3. Hold this position for a few seconds. 4. Extend your fingers out to their original position, all pointing straight up again. Finger Fan  Repeat this exercise 5-10 times with each hand. 6. Hold your arm and hand  out in front of you. Keep your wrist straight. 7. Squeeze your hand into a fist. 8. Hold this position for a few seconds. 9. Fan out, or spread apart, your hand and fingers as much as possible, stretching every joint fully. Tabletop  Repeat this exercise 5-10 times with each hand. 6. Stand or sit with your arm, hand, and all five fingers pointed straight up. Make sure your wrist is straight. 7. Gently and slowly bend your fingers at the knuckles where they meet the hand until your hand is making an upside-down L shape. Your fingers should form a tabletop. 8. Hold this position for a few seconds. 9. Extend your fingers out to their original position, all pointing straight up again. Making Os  Repeat this exercise 5-10 times with each hand. 5. Stand or sit with your arm, hand, and all five fingers pointed straight up. Make sure your wrist is straight. 6. Make an O shape by touching your pointer finger to your thumb. Hold for a few seconds. Then open your hand wide. 7. Repeat this motion with each finger on your hand. Table Spread  Repeat this exercise 5-10 times with each hand. 5. Place your hand  on a table with your palm facing down. Make sure your wrist is straight. 6. Spread your fingers out as much as possible. Hold this position for a few seconds. 7. Slide your fingers back together again. Hold for a few seconds. Ball Grip   Repeat this exercise 10-15 times with each hand. 5. Hold a tennis ball or another soft ball in your hand. 6. While slowly increasing pressure, squeeze the ball as hard as possible. 7. Squeeze as hard as you can for 3-5 seconds. 8. Relax and repeat. Wrist Curls  Repeat this exercise 10-15 times with each hand. 1. Sit in a chair that has armrests. 2. Hold a light weight in your hand, such as a dumbbell that weighs 1-3 pounds (0.5-1.4 kg). Ask your health care provider what weight would be best for you. 3. Rest your hand just over the end of the chair arm with your palm facing up. 4. Gently pivot your wrist up and down while holding the weight. Do not twist your wrist from side to side. Contact a health care provider if:  Your hand pain or discomfort gets much worse when you do an exercise.  Your hand pain or discomfort does not improve within 2 hours after you exercise. If you have any of these problems, stop doing these exercises right away. Do not do them again unless your health care provider says that you can. Get help right away if:  You develop sudden, severe hand pain. If this happens, stop doing these exercises right away. Do not do them again unless your health care provider says that you can. This information is not intended to replace advice given to you by your health care provider. Make sure you discuss any questions you have with your health care provider. Document Released: 10/31/2015 Document Revised: 04/26/2016 Document Reviewed: 05/30/2015 Elsevier Interactive Patient Education  2017 [employer].

## 2017-04-02 ENCOUNTER — Other Ambulatory Visit: Payer: Self-pay | Admitting: Rheumatology

## 2017-04-02 NOTE — Telephone Encounter (Signed)
03/22/17 last visit  08/23/17 next visit  Labs  WNL on 03/21/17 Ok to refill per Dr Estanislado Pandy

## 2017-06-06 ENCOUNTER — Telehealth: Payer: Self-pay | Admitting: Rheumatology

## 2017-06-06 DIAGNOSIS — Z131 Encounter for screening for diabetes mellitus: Secondary | ICD-10-CM

## 2017-06-06 NOTE — Telephone Encounter (Signed)
Patient would like you to call her please.  Thank you.

## 2017-06-06 NOTE — Telephone Encounter (Signed)
Patient states she saw PCP and the PCP wants to know if we can add a Hgb A1C to her standing orders in July 2018. Please advise

## 2017-06-06 NOTE — Telephone Encounter (Signed)
Okay to add hemoglobin A1c

## 2017-06-07 NOTE — Telephone Encounter (Signed)
Added to be drawn with next labs.

## 2017-06-20 ENCOUNTER — Telehealth: Payer: Self-pay | Admitting: Rheumatology

## 2017-06-20 ENCOUNTER — Other Ambulatory Visit: Payer: Self-pay | Admitting: *Deleted

## 2017-06-20 ENCOUNTER — Other Ambulatory Visit: Payer: Self-pay | Admitting: Rheumatology

## 2017-06-20 DIAGNOSIS — Z131 Encounter for screening for diabetes mellitus: Secondary | ICD-10-CM

## 2017-06-20 LAB — CBC WITH DIFFERENTIAL/PLATELET
BASOS PCT: 1 %
Basophils Absolute: 54 cells/uL (ref 0–200)
EOS PCT: 2 %
Eosinophils Absolute: 108 cells/uL (ref 15–500)
HCT: 36.6 % (ref 35.0–45.0)
HEMOGLOBIN: 12 g/dL (ref 11.7–15.5)
LYMPHS ABS: 1944 {cells}/uL (ref 850–3900)
Lymphocytes Relative: 36 %
MCH: 32.9 pg (ref 27.0–33.0)
MCHC: 32.8 g/dL (ref 32.0–36.0)
MCV: 100.3 fL — ABNORMAL HIGH (ref 80.0–100.0)
MPV: 9.9 fL (ref 7.5–12.5)
Monocytes Absolute: 486 cells/uL (ref 200–950)
Monocytes Relative: 9 %
NEUTROS ABS: 2808 {cells}/uL (ref 1500–7800)
Neutrophils Relative %: 52 %
Platelets: 220 10*3/uL (ref 140–400)
RBC: 3.65 MIL/uL — AB (ref 3.80–5.10)
RDW: 14.9 % (ref 11.0–15.0)
WBC: 5.4 10*3/uL (ref 3.8–10.8)

## 2017-06-21 LAB — HEMOGLOBIN A1C
Hgb A1c MFr Bld: 5.4 % (ref <5.7)
MEAN PLASMA GLUCOSE: 108 mg/dL

## 2017-06-21 LAB — COMPLETE METABOLIC PANEL WITH GFR
ALBUMIN: 3.8 g/dL (ref 3.6–5.1)
ALK PHOS: 53 U/L (ref 33–130)
ALT: 24 U/L (ref 6–29)
AST: 31 U/L (ref 10–35)
BUN: 16 mg/dL (ref 7–25)
CO2: 24 mmol/L (ref 20–31)
Calcium: 9.4 mg/dL (ref 8.6–10.4)
Chloride: 105 mmol/L (ref 98–110)
Creat: 0.96 mg/dL (ref 0.50–0.99)
GFR, EST NON AFRICAN AMERICAN: 61 mL/min (ref >=60)
GFR, Est African American: 70 mL/min (ref >=60)
GLUCOSE: 84 mg/dL (ref 65–99)
POTASSIUM: 4 mmol/L (ref 3.5–5.3)
SODIUM: 142 mmol/L (ref 135–146)
Total Bilirubin: 0.4 mg/dL (ref 0.2–1.2)
Total Protein: 6.1 g/dL (ref 6.1–8.1)

## 2017-06-21 NOTE — Progress Notes (Signed)
WNL

## 2017-06-21 NOTE — Progress Notes (Signed)
stable °

## 2017-06-24 ENCOUNTER — Other Ambulatory Visit: Payer: Self-pay | Admitting: Rheumatology

## 2017-06-24 NOTE — Telephone Encounter (Signed)
03/22/17 last visit  08/23/17 next visit  Labs: 06/20/17 Stable Ok to refill per Dr Estanislado Pandy

## 2017-06-27 NOTE — Telephone Encounter (Signed)
Opened in error

## 2017-07-29 ENCOUNTER — Other Ambulatory Visit: Payer: Self-pay | Admitting: Rheumatology

## 2017-07-29 NOTE — Telephone Encounter (Signed)
03/22/17 last visit  08/23/17 next visit   Okay to refill per Dr. Estanislado Pandy

## 2017-08-23 ENCOUNTER — Ambulatory Visit: Payer: Medicare Other | Admitting: Rheumatology

## 2017-09-20 LAB — COMPLETE METABOLIC PANEL WITH GFR
AG RATIO: 2 (calc) (ref 1.0–2.5)
ALBUMIN MSPROF: 4.4 g/dL (ref 3.6–5.1)
ALT: 40 U/L — AB (ref 6–29)
AST: 38 U/L — ABNORMAL HIGH (ref 10–35)
Alkaline phosphatase (APISO): 76 U/L (ref 33–130)
BUN: 16 mg/dL (ref 7–25)
CALCIUM: 9.9 mg/dL (ref 8.6–10.4)
CHLORIDE: 102 mmol/L (ref 98–110)
CO2: 30 mmol/L (ref 20–32)
Creat: 0.91 mg/dL (ref 0.50–0.99)
GFR, EST AFRICAN AMERICAN: 75 mL/min/{1.73_m2} (ref >=60)
GFR, EST NON AFRICAN AMERICAN: 64 mL/min/{1.73_m2} (ref >=60)
Globulin: 2.2 g/dL (calc) (ref 1.9–3.7)
Glucose, Bld: 95 mg/dL (ref 65–99)
POTASSIUM: 4.6 mmol/L (ref 3.5–5.3)
Sodium: 142 mmol/L (ref 135–146)
TOTAL PROTEIN: 6.6 g/dL (ref 6.1–8.1)
Total Bilirubin: 0.6 mg/dL (ref 0.2–1.2)

## 2017-09-20 LAB — CBC WITH DIFFERENTIAL/PLATELET
BASOS PCT: 0.7 %
Basophils Absolute: 50 cells/uL (ref 0–200)
EOS ABS: 92 {cells}/uL (ref 15–500)
Eosinophils Relative: 1.3 %
HEMATOCRIT: 38 % (ref 35.0–45.0)
HEMOGLOBIN: 13.1 g/dL (ref 11.7–15.5)
LYMPHS ABS: 1534 {cells}/uL (ref 850–3900)
MCH: 33.2 pg — ABNORMAL HIGH (ref 27.0–33.0)
MCHC: 34.5 g/dL (ref 32.0–36.0)
MCV: 96.4 fL (ref 80.0–100.0)
MPV: 10.3 fL (ref 7.5–12.5)
Monocytes Relative: 6.9 %
NEUTROS ABS: 4935 {cells}/uL (ref 1500–7800)
Neutrophils Relative %: 69.5 %
PLATELETS: 236 10*3/uL (ref 140–400)
RBC: 3.94 10*6/uL (ref 3.80–5.10)
RDW: 14.3 % (ref 11.0–15.0)
Total Lymphocyte: 21.6 %
WBC: 7.1 10*3/uL (ref 3.8–10.8)
WBCMIX: 490 {cells}/uL (ref 200–950)

## 2017-09-21 ENCOUNTER — Other Ambulatory Visit: Payer: Self-pay | Admitting: Rheumatology

## 2017-09-21 NOTE — Telephone Encounter (Signed)
Patient was clinically doing well during the last visit. Her LFTs are mildly elevated. Please advise her to reduce methotrexate to 8 tablets by mouth every week. We will check labs as scheduled.

## 2017-09-23 NOTE — Telephone Encounter (Signed)
Last Visit: 03/22/17 Next Visit: 12/25/16 Labs: 09/20/17 Her LFTs are mildly elevated  Okay to refill per Dr. Estanislado Pandy

## 2017-10-03 ENCOUNTER — Encounter: Payer: Self-pay | Admitting: Adult Health

## 2017-10-03 ENCOUNTER — Ambulatory Visit (HOSPITAL_BASED_OUTPATIENT_CLINIC_OR_DEPARTMENT_OTHER): Payer: Medicare Other | Admitting: Adult Health

## 2017-10-03 ENCOUNTER — Encounter: Payer: Medicare Other | Admitting: Adult Health

## 2017-10-03 VITALS — BP 137/49 | HR 85 | Temp 98.3°F | Resp 16 | Ht 61.75 in | Wt 147.8 lb

## 2017-10-03 DIAGNOSIS — M069 Rheumatoid arthritis, unspecified: Secondary | ICD-10-CM

## 2017-10-03 DIAGNOSIS — Z853 Personal history of malignant neoplasm of breast: Secondary | ICD-10-CM

## 2017-10-03 DIAGNOSIS — C50511 Malignant neoplasm of lower-outer quadrant of right female breast: Secondary | ICD-10-CM

## 2017-10-03 NOTE — Progress Notes (Signed)
Perez:  Survivorship   REASON FOR VISIT:  Routine follow-up for history of breast cancer.   BRIEF ONCOLOGIC HISTORY:    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Melissa Perez)   11/23/2011 Surgery    Right breast lumpectomy: Residual foci of intermediate grade DCIS 0.3 cm 0.1 cm, 0/1 lymph node, grade 2, ER 100%, PR 100%      01/22/2012 - 02/29/2012 Radiation Therapy    Adjuvant radiation therapy      03/22/2012 -  Anti-estrogen oral therapy    Adjuvant tamoxifen 20 mg daily        INTERVAL HISTORY:  Melissa Perez presents to the [facility] today for routine follow-up for her history of breast cancer.  Overall, she reports feeling quite well. She stopped taking Tamoxifen in May, 2018, and feels like she has experienced a decrease in hot flashes.  She does have fatigue due to her rheumatoid arthritis.  She has a PCP who she sees regularly.  She exercises by walking forty minutes four to five times per week.      REVIEW OF SYSTEMS:  Review of Systems  Constitutional: Negative for appetite change, chills, fatigue, fever and unexpected weight change.  HENT:   Negative for hearing loss and lump/mass.   Eyes: Negative for eye problems and icterus.  Respiratory: Negative for chest tightness, cough and shortness of breath.   Cardiovascular: Negative for chest pain, leg swelling and palpitations.  Gastrointestinal: Negative for abdominal distention and abdominal pain.  Endocrine: Negative for hot flashes.  Musculoskeletal: Positive for arthralgias (has rheumatoid arthritis).  Skin: Negative for itching and rash.  Neurological: Negative for dizziness, extremity weakness, headaches and numbness.  Hematological: Negative for adenopathy. Does not bruise/bleed easily.  Psychiatric/Behavioral: Negative for depression. The patient is not nervous/anxious.   Breast: Denies any new nodularity, masses, tenderness, nipple changes, or nipple discharge.       PAST  MEDICAL/SURGICAL HISTORY:  Past Medical History:  Diagnosis Date  . Arthritis    rheumatoid, take Humira  . Arthritis    rheumatoid  . Arthritis pain   . Breast cancer ([place])   . Cancer ([place])   . Hyperlipidemia   . Hypertension   . Rash   . Rheumatoid arthritis ([place])   . S/P lumpectomy of breast 12/03/2011   Past Surgical History:  Procedure Laterality Date  . ABDOMINAL HYSTERECTOMY    . APPENDECTOMY    . BREAST SURGERY     2 left breast biopsies-benign  . left breast biopsies  1991 & 1990  . MASTECTOMY PARTIAL / LUMPECTOMY  12/12     ALLERGIES:  Allergies  Allergen Reactions  . Dilaudid [Hydromorphone Hcl] Nausea And Vomiting  . Hydrocodone Nausea And Vomiting     CURRENT MEDICATIONS:  Outpatient Encounter Prescriptions as of 10/03/2017  Medication Sig Note  . Calcium 500-100 MG-UNIT CHEW Chew by mouth. 10/17/2016: Received from: [facility] Received Sig: Take 1 tablet by mouth.  . Cholecalciferol (VITAMIN D3) 1000 UNITS CAPS Take by mouth.   . clobetasol ointment (TEMOVATE) 3.50 % Apply 1 application topically as needed. 10/18/2016: Received from: [facility] Received Sig: Apply to affected area twice daily for 2 weeks then once daily for 2 weeks.  . clobetasol ointment (TEMOVATE) 0.05 % AAA once daily for 8 weeks   . diclofenac sodium (VOLTAREN) 1 % GEL Apply 2 g topically 3 (three) times daily.   . folic acid (FOLVITE) 1 MG tablet  0816-17**TAKE 2 TABLETS BY MOUTH DAILY   . ibuprofen (ADVIL,MOTRIN) 200 MG tablet Take 200 mg by mouth as needed.   . iron polysaccharides (NIFEREX 60) 40-20 MG capsule Take 1 capsule by mouth daily with breakfast. Taking 65 mg   . loratadine (CLARITIN) 10 MG tablet Take 10 mg by mouth daily as needed.    . methotrexate (RHEUMATREX) 2.5 MG tablet Take 8 tablets (20 mg total) by mouth once a week.   . triamcinolone ointment (KENALOG) 0.1 % Apply 1 application topically as needed. 10/18/2016:  Received from: [facility] Received Sig: Apply to affected area 1-2 times daily as needed.  Marland Kitchen UNABLE TO FIND 1 tablet daily. One a Day Women's 50+   . valACYclovir (VALTREX) 1000 MG tablet Take 1,000 mg by mouth 2 (two) times daily.  04/05/2015: Received from: External Pharmacy  . valACYclovir (VALTREX) 500 MG tablet TAKE 1 TABLET BY MOUTH TWICE A DAY X 5 DAYS   . valsartan-hydrochlorothiazide (DIOVAN-HCT) 80-12.5 MG per tablet Take 1 tablet by mouth daily.    . hydroxychloroquine (PLAQUENIL) 200 MG tablet 200 mg daily.  10/04/2014: Received from: External Pharmacy  . tamoxifen (NOLVADEX) 20 MG tablet TAKE 1 TABLET EVERY DAY (Patient not taking: Reported on 10/03/2017)    No facility-administered encounter medications on file as of 10/03/2017.      ONCOLOGIC FAMILY HISTORY:  Family History  Problem Relation Age of Onset  . Breast cancer Mother   . Cancer Mother   . Kidney cancer Father   . Cancer Father   . Breast cancer Sister   . Cancer Sister   . Lung cancer Sister   . Colon cancer Maternal Uncle   . Liver cancer Paternal Aunt     GENETIC COUNSELING/TESTING: Done in 2012, declines re referral for retesting  SOCIAL HISTORY:  Melissa Perez is divorced and lives alone in [place], [place].  She has 1 child and they live in [place], [place].  Melissa Perez is currently retired.  She denies any current or history of tobacco, alcohol, or illicit drug use.     PHYSICAL EXAMINATION:  Vital Signs: Vitals:   10/03/17 0826  BP: (!) 137/49  Pulse: 85  Resp: 16  Temp: 98.3 F (36.8 C)  SpO2: 100%   Filed Weights   10/03/17 0826  Weight: 147 lb 12.8 oz (67 kg)   General: Well-nourished, well-appearing female in no acute distress.  Unaccompanied today.   HEENT: Head is normocephalic.  Pupils equal and reactive to light. Conjunctivae clear without exudate.  Sclerae anicteric. Oral mucosa is pink, moist.  Oropharynx is pink without lesions or erythema.  Lymph:  No cervical, supraclavicular, or infraclavicular lymphadenopathy noted on palpation.  Cardiovascular: Regular rate and rhythm.Marland Kitchen Respiratory: Clear to auscultation bilaterally. Chest expansion symmetric; breathing non-labored.  Breast Exam:  -Left breast: No appreciable masses on palpation. No skin redness, thickening, or peau d'orange appearance; no nipple retraction or nipple discharge;  -Right breast: No appreciable masses on palpation. No skin redness, thickening, or peau d'orange appearance; no nipple retraction or nipple discharge; mild distortion in symmetry at previous lumpectomy site well healed scar without erythema or nodularity. -Axilla: No axillary adenopathy bilaterally.  GI: Abdomen soft and round; non-tender, non-distended. Bowel sounds normoactive. No hepatosplenomegaly.   GU: Deferred.  Neuro: No focal deficits. Steady gait.  Psych: Mood and affect normal and appropriate for situation.  MSK: No focal spinal tenderness to palpation, full range of motion in bilateral upper extremities Extremities:  No edema. Skin: Warm and dry.  LABORATORY DATA:  None for this visit   DIAGNOSTIC IMAGING:  Most recent mammogram:      ASSESSMENT AND PLAN:  Ms.. Perez is a pleasant 71 y.o. 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  She presents to the [facility] for surveillance and routine follow-up.   1. History of breast cancer:  Melissa Perez is currently clinically and radiographically without evidence of disease or recurrence of breast cancer. She will be due for mammogram in 01/2018.  She is now more than 5 years out from her diagnosis.  She can be graduated from our breast cancer program, and feels comfortable following up with her PCP for her future breast cancer surveillance.    2. Bone  health:  Given Ms. 38 age, history of breast cancer, she is at slight risk for bone demineralization. I will defer to her PCP regarding bone density testing and management.  She was given education on specific food and activities to promote bone health.  3. Cancer screening:  Due to Melissa Perez's history and her age, she should receive screening for skin cancers, colon cancer. She was encouraged to follow-up with her PCP for appropriate cancer screenings.   4. Health maintenance and wellness promotion: Melissa Perez was encouraged to consume 5-7 servings of fruits and vegetables per day. She was also encouraged to engage in moderate to vigorous exercise for 30 minutes per day most days of the week. She was instructed to limit her alcohol consumption and continue to abstain from tobacco use.    Dispo:  -Return to cancer center PRN -Mammogram due in 01/2018   A total of (30) minutes of face-to-face time was spent with this patient with greater than 50% of that time in counseling and care-coordination.   Melissa Phlegm, NP Survivorship Program [facility] [phone]   Note: PRIMARY CARE PROVIDER Lucianne Lei, [place] [phone]

## 2017-10-26 ENCOUNTER — Other Ambulatory Visit: Payer: Self-pay | Admitting: Rheumatology

## 2017-10-28 ENCOUNTER — Telehealth: Payer: Self-pay | Admitting: Rheumatology

## 2017-10-28 NOTE — Telephone Encounter (Signed)
Per patient she is experiencing increased back pain since Wednesday, and gotten worse since then. Patient wants to come in and be seen. Please call to advise.

## 2017-10-28 NOTE — Telephone Encounter (Addendum)
Patient is having trouble with walking. Patient states she is having trouble in her back. Patient states she has tried NSAIDS and Ice. Patient has been advised to contact her orthopedic for an appointment.

## 2017-10-28 NOTE — Telephone Encounter (Signed)
Last visit: 03/22/2017 Next visit: 12/25/2017  Okay to refill per Dr. Estanislado Pandy.

## 2017-12-11 ENCOUNTER — Other Ambulatory Visit: Payer: Self-pay | Admitting: Rheumatology

## 2017-12-11 NOTE — Telephone Encounter (Signed)
Last visit: 03/22/2017 Next visit: 12/25/2017 Labs: 09/20/17 Her LFTs are mildly elevated  Okay to refill per Dr. Estanislado Pandy

## 2017-12-12 NOTE — Progress Notes (Signed)
Office Visit Note  Patient: Melissa Perez             Date of Birth: [birth year]           MRN: [id number]             PCP: Lucianne Lei, MD Referring: Lucianne Lei, MD Visit Date: 12/25/2017 Occupation: @GUAROCC @    Subjective:  Bilateral hand pain    History of Present Illness: Melissa Perez is a 72 y.o. female with history of seropositive rheumatoid arthritis and SI joint pain.  Patient states that the day before thanksgiving she was kneeling for a long period of time scrubbing an old table, and the next day she had severe pain in her back, bilateral knees, and hands.  She states her bilateral knee pain has been improving.  She continues to have severe pain in her right hand and wrist.  She also has swelling in her right hand.  She is seeing Dr. Mina Marble tomorrow for an epidural cortisone injection at L3-L4.  She continues to use Voltaren gel.     Activities of Daily Living:  Patient reports morning stiffness for 0  minute.   Patient Reports nocturnal pain.  Difficulty dressing/grooming: Denies Difficulty climbing stairs: Reports Difficulty getting out of chair: Reports Difficulty using hands for taps, buttons, cutlery, and/or writing: Reports   Review of Systems  Constitutional: Positive for fatigue. Negative for weakness.  HENT: Negative for mouth sores, mouth dryness and nose dryness.   Eyes: Negative for redness, visual disturbance and dryness.  Respiratory: Negative for cough, hemoptysis, shortness of breath and difficulty breathing.   Cardiovascular: Negative for chest pain, palpitations, hypertension and swelling in legs/feet.  Gastrointestinal: Negative for blood in stool, constipation and diarrhea.  Musculoskeletal: Positive for arthralgias, joint pain and joint swelling. Negative for myalgias, muscle weakness, morning stiffness, muscle tenderness and myalgias.  Skin: Positive for rash. Negative for color change, pallor, hair loss, nodules/bumps, redness, skin tightness, ulcers  and sensitivity to sunlight.  Neurological: Negative for dizziness, numbness and headaches.  Psychiatric/Behavioral: Positive for sleep disturbance. Negative for depressed mood. The patient is not nervous/anxious.     PMFS History:  Patient Active Problem List   Diagnosis Date Noted  . Essential hypertension 03/19/2017  . History of pityriasis rosea 03/19/2017  . 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 History of malignant neoplasm of breast 03/19/2017  . Rheumatoid arthritis  10/15/2016  . High risk medication use 10/15/2016  . 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 S/P lumpectomy of breast 12/03/2011  . 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[place]) 11/01/2011    Past Medical History:  Diagnosis Date  . Arthritis    rheumatoid, take Humira  . Arthritis    rheumatoid  . Arthritis pain   . Breast cancer ([place])   . Cancer ([place])   . Hyperlipidemia   . Hypertension   . Rash   . Rheumatoid arthritis ([place])   . S/P lumpectomy of breast 12/03/2011    Family History  Problem Relation Age of Onset  . Breast cancer Mother   . Cancer Mother   . Kidney cancer Father   . Cancer Father   . Breast cancer Sister   . Cancer Sister   . Lung cancer Sister   . Colon cancer Maternal Uncle   . Liver cancer Paternal Aunt    Past Surgical History:  Procedure Laterality Date  . ABDOMINAL HYSTERECTOMY    .  APPENDECTOMY    . BREAST SURGERY     2 left breast biopsies-benign  . left breast biopsies  1991 & 1990  . MASTECTOMY PARTIAL / LUMPECTOMY  12/12   Social History   Social History Narrative   Adult Son in the [place]     Objective: Vital Signs: BP (!) 104/58 (BP Location: Left Arm, Patient Position: Sitting, Cuff Size: Normal)   Pulse 63   Resp 14   Ht 5' 1.75" (1.568 m)   Wt 150 lb (68 kg)   BMI 27.66 kg/m    Physical Exam  Constitutional: She is oriented to person, place, and time. She appears well-developed and  well-nourished.  HENT:  Head: Normocephalic and atraumatic.  Eyes: Conjunctivae and EOM are normal.  Neck: Normal range of motion.  Cardiovascular: Normal rate, regular rhythm, normal heart sounds and intact distal pulses.  Pulmonary/Chest: Effort normal and breath sounds normal.  Abdominal: Soft. Bowel sounds are normal.  Lymphadenopathy:    She has no cervical adenopathy.  Neurological: She is alert and oriented to person, place, and time.  Skin: Skin is warm and dry. Capillary refill takes less than 2 seconds.  Psychiatric: She has a normal mood and affect. Her behavior is normal.  Nursing note and vitals reviewed.    Musculoskeletal Exam: C-spine, thoracic, and lumbar spine good ROM.  Shoulder joints, elbow joints good ROM.  Discomfort with wrist ROM.  MCPs, PIPs, and DIPs good ROM with no synovitis.  She has tenderness over her right MCPs.  Hip joints, knee joints, ankle joints, MTPs, PIPs, and DIPs good ROM with no synovitis.  Right knee crepitus.  No warmth or effusion of bilateral knees.  No trochanteric bursitis.    CDAI Exam: CDAI Homunculus Exam:   Tenderness:  RUE: wrist Right hand: 2nd MCP, 3rd MCP, 4th MCP and 5th MCP  Joint Counts:  CDAI Tender Joint count: 5 CDAI Swollen Joint count: 0  Global Assessments:  Patient Global Assessment: 7   CDAI Calculated Score: 12    Investigation: No additional findings. CBC Latest Ref Rng & Units 12/20/2017 09/20/2017 06/20/2017  WBC 3.8 - 10.8 Thousand/uL 4.8 7.1 5.4  Hemoglobin 11.7 - 15.5 g/dL 12.8 13.1 12.0  Hematocrit 35.0 - 45.0 % 37.5 38.0 36.6  Platelets 140 - 400 Thousand/uL 239 236 220   CMP Latest Ref Rng & Units 12/20/2017 09/20/2017 06/20/2017  Glucose 65 - 139 mg/dL 88 95 84  BUN 7 - 25 mg/dL 14 16 16   Creatinine 0.50 - 0.99 mg/dL 0.87 0.91 0.96  Sodium 135 - 146 mmol/L 140 142 142  Potassium 3.5 - 5.3 mmol/L 4.5 4.6 4.0  Chloride 98 - 110 mmol/L 105 102 105  CO2 20 - 32 mmol/L 27 30 24   Calcium 8.6 -  10.4 mg/dL 9.6 9.9 9.4  Total Protein 6.1 - 8.1 g/dL 6.4 6.6 6.1  Total Bilirubin 0.2 - 1.2 mg/dL 0.5 0.6 0.4  Alkaline Phos 33 - 130 U/L - - 53  AST 10 - 35 U/L 32 38(H) 31  ALT 6 - 29 U/L 34(H) 40(H) 24    Imaging: No results found.  Speciality Comments: No specialty comments available.    Procedures:  No procedures performed Allergies: Dilaudid [hydromorphone hcl] and Hydrocodone    Assessment / Plan:     Visit Diagnoses: Rheumatoid arthritis  - +RF,+CCP: She has been having increased tenderness of all right hand MCPs and right wrist since thanksgiving.  She began tapering off of PLQ after her  visit on October 18, 2016.   Her MTX dose was decreased from 10 tablets weekly to 8 tablets due to a mild elevation of LFTs on 09/20/17.  She will be restarted on Plaquenil 200 mg 1 tablet daily.  A prescription was sent to her pharmacy.  Labs will be checked with her next labs in March.   High risk medication use - MTX 4 tablets Tuesdays and 4 tablets on Thursdays, folic acid 2 mg -CBC and CMP were stable on 12/20/17.  Routine CBC and CMP will be drawn every 3 months. 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  She is having discomfort at L4-L5.  She is going to see Dr. Mina Marble tomorrow for a cortisone epidural injection  Morton's neuroma, right: Her morton's neuroma has not been causing discomfort.    Other medical conditions are listed as follows:   Positive PPD, treated - +INH  History of hypertension  History of malignant neoplasm of breast - Stage0,s/p RTx11/12,lumpectomy    Orders: Orders Placed This Encounter  Procedures  . CBC with Differential/Platelet  . COMPLETE METABOLIC PANEL WITH GFR   Meds ordered this encounter  Medications  . hydroxychloroquine (PLAQUENIL) 200 MG tablet    Sig: Take 1 tablet (200 mg total) by mouth daily.    Dispense:  30 tablet    Refill:  2    Face-to-face time spent  with patient was 30 minutes. Greater than 50% of time was spent in counseling and coordination of care.  Follow-Up Instructions: Return in about 5 months (around 05/25/2018) for Rheumatoid arthritis.   Bo Merino, MD  Note - This record has been created using Editor, commissioning.  Chart creation errors have been sought, but may not always  have been located. Such creation errors do not reflect on  the standard of medical care.

## 2017-12-20 ENCOUNTER — Telehealth: Payer: Self-pay | Admitting: Rheumatology

## 2017-12-20 DIAGNOSIS — Z79899 Other long term (current) drug therapy: Secondary | ICD-10-CM

## 2017-12-20 LAB — CBC WITH DIFFERENTIAL/PLATELET
BASOS PCT: 1.3 %
Basophils Absolute: 62 cells/uL (ref 0–200)
Eosinophils Absolute: 130 cells/uL (ref 15–500)
Eosinophils Relative: 2.7 %
HCT: 37.5 % (ref 35.0–45.0)
Hemoglobin: 12.8 g/dL (ref 11.7–15.5)
LYMPHS ABS: 1541 {cells}/uL (ref 850–3900)
MCH: 34.1 pg — ABNORMAL HIGH (ref 27.0–33.0)
MCHC: 34.1 g/dL (ref 32.0–36.0)
MCV: 100 fL (ref 80.0–100.0)
MPV: 10.2 fL (ref 7.5–12.5)
Monocytes Relative: 9.5 %
NEUTROS PCT: 54.4 %
Neutro Abs: 2611 cells/uL (ref 1500–7800)
PLATELETS: 239 10*3/uL (ref 140–400)
RBC: 3.75 10*6/uL — AB (ref 3.80–5.10)
RDW: 15.3 % — AB (ref 11.0–15.0)
TOTAL LYMPHOCYTE: 32.1 %
WBC: 4.8 10*3/uL (ref 3.8–10.8)
WBCMIX: 456 {cells}/uL (ref 200–950)

## 2017-12-20 LAB — COMPLETE METABOLIC PANEL WITH GFR
AG RATIO: 2.2 (calc) (ref 1.0–2.5)
ALBUMIN MSPROF: 4.4 g/dL (ref 3.6–5.1)
ALT: 34 U/L — ABNORMAL HIGH (ref 6–29)
AST: 32 U/L (ref 10–35)
Alkaline phosphatase (APISO): 80 U/L (ref 33–130)
BILIRUBIN TOTAL: 0.5 mg/dL (ref 0.2–1.2)
BUN: 14 mg/dL (ref 7–25)
CALCIUM: 9.6 mg/dL (ref 8.6–10.4)
CHLORIDE: 105 mmol/L (ref 98–110)
CO2: 27 mmol/L (ref 20–32)
Creat: 0.87 mg/dL (ref 0.50–0.99)
GFR, EST AFRICAN AMERICAN: 79 mL/min/{1.73_m2} (ref >=60)
GFR, EST NON AFRICAN AMERICAN: 68 mL/min/{1.73_m2} (ref >=60)
Globulin: 2 g/dL (calc) (ref 1.9–3.7)
Glucose, Bld: 88 mg/dL (ref 65–139)
POTASSIUM: 4.5 mmol/L (ref 3.5–5.3)
Sodium: 140 mmol/L (ref 135–146)
TOTAL PROTEIN: 6.4 g/dL (ref 6.1–8.1)

## 2017-12-20 NOTE — Telephone Encounter (Signed)
[facility] on [address] calling. Patient there now for labwork. Please release orders.

## 2017-12-20 NOTE — Telephone Encounter (Signed)
Labs released.  

## 2017-12-23 NOTE — Telephone Encounter (Signed)
Labs are stable.

## 2017-12-25 ENCOUNTER — Encounter: Payer: Self-pay | Admitting: Rheumatology

## 2017-12-25 ENCOUNTER — Ambulatory Visit: Payer: Medicare Other | Admitting: Rheumatology

## 2017-12-25 VITALS — BP 104/58 | HR 63 | Resp 14 | Ht 61.75 in | Wt 150.0 lb

## 2017-12-25 DIAGNOSIS — R7611 Nonspecific reaction to tuberculin skin test without active tuberculosis: Secondary | ICD-10-CM | POA: Diagnosis not present

## 2017-12-25 DIAGNOSIS — M0579 Rheumatoid arthritis with rheumatoid factor of multiple sites without organ or systems involvement: Secondary | ICD-10-CM | POA: Diagnosis not present

## 2017-12-25 DIAGNOSIS — Z853 Personal history of malignant neoplasm of breast: Secondary | ICD-10-CM | POA: Diagnosis not present

## 2017-12-25 DIAGNOSIS — Z8679 Personal history of other diseases of the circulatory system: Secondary | ICD-10-CM

## 2017-12-25 DIAGNOSIS — M533 Sacrococcygeal disorders, not elsewhere classified: Secondary | ICD-10-CM | POA: Diagnosis not present

## 2017-12-25 DIAGNOSIS — G5760 Lesion of plantar nerve, unspecified lower limb: Secondary | ICD-10-CM

## 2017-12-25 DIAGNOSIS — Z79899 Other long term (current) drug therapy: Secondary | ICD-10-CM

## 2017-12-25 DIAGNOSIS — G8929 Other chronic pain: Secondary | ICD-10-CM

## 2017-12-25 MED ORDER — HYDROXYCHLOROQUINE SULFATE 200 MG PO TABS: 200.0000 mg | ORAL_TABLET | Freq: Every day | ORAL | 2 refills | Status: DC

## 2017-12-25 NOTE — Patient Instructions (Signed)
Standing Labs We placed an order today for your standing lab work.    Please come back and get your standing labs in March and every 3 months  We have open lab Monday through Friday from 8:30-11:30 AM and 1:30-4 PM at the office of Dr. Shaili Deveshwar.   The office is located at [address], [address], [place], [place] [address] No appointment is necessary.   Labs are drawn by [facility].  You may receive a bill from [facility] for your lab work. If you have any questions regarding directions or hours of operation,  please call [phone].    

## 2018-01-08 ENCOUNTER — Other Ambulatory Visit: Payer: Self-pay | Admitting: Hematology and Oncology

## 2018-01-08 DIAGNOSIS — Z853 Personal history of malignant neoplasm of breast: Secondary | ICD-10-CM

## 2018-01-29 ENCOUNTER — Ambulatory Visit
Admission: RE | Admit: 2018-01-29 | Discharge: 2018-01-29 | Disposition: A | Discharge status: Home / Self Care | Payer: Medicare Other | Source: Ambulatory Visit | Attending: Hematology and Oncology | Admitting: Hematology and Oncology

## 2018-01-29 DIAGNOSIS — Z853 Personal history of malignant neoplasm of breast: Secondary | ICD-10-CM

## 2018-01-29 HISTORY — DX: Personal history of irradiation: Z92.3

## 2018-03-04 ENCOUNTER — Other Ambulatory Visit: Payer: Self-pay | Admitting: Rheumatology

## 2018-03-04 NOTE — Telephone Encounter (Signed)
Last Visit: 12/28/17 Next Visit: 05/27/18 Labs: 12/20/17 Stable  Okay to refill per Dr. Estanislado Pandy

## 2018-03-17 ENCOUNTER — Other Ambulatory Visit: Payer: Self-pay | Admitting: Physician Assistant

## 2018-03-17 NOTE — Telephone Encounter (Signed)
Last Visit: 12/28/17 Next Visit: 05/27/18 Labs: 12/20/17 Stable  Okay to refill per Dr. Estanislado Pandy

## 2018-03-24 ENCOUNTER — Other Ambulatory Visit: Payer: Self-pay | Admitting: *Deleted

## 2018-03-24 DIAGNOSIS — Z79899 Other long term (current) drug therapy: Secondary | ICD-10-CM

## 2018-03-24 LAB — COMPLETE METABOLIC PANEL WITH GFR
AG RATIO: 2.3 (calc) (ref 1.0–2.5)
ALT: 27 U/L (ref 6–29)
AST: 27 U/L (ref 10–35)
Albumin: 4.5 g/dL (ref 3.6–5.1)
Alkaline phosphatase (APISO): 82 U/L (ref 33–130)
BUN: 19 mg/dL (ref 7–25)
CALCIUM: 9.6 mg/dL (ref 8.6–10.4)
CO2: 30 mmol/L (ref 20–32)
CREATININE: 0.93 mg/dL (ref 0.60–0.93)
Chloride: 105 mmol/L (ref 98–110)
GFR, EST AFRICAN AMERICAN: 72 mL/min/{1.73_m2} (ref >=60)
GFR, EST NON AFRICAN AMERICAN: 62 mL/min/{1.73_m2} (ref >=60)
Globulin: 2 g/dL (calc) (ref 1.9–3.7)
Glucose, Bld: 92 mg/dL (ref 65–99)
POTASSIUM: 4.2 mmol/L (ref 3.5–5.3)
Sodium: 141 mmol/L (ref 135–146)
TOTAL PROTEIN: 6.5 g/dL (ref 6.1–8.1)
Total Bilirubin: 0.5 mg/dL (ref 0.2–1.2)

## 2018-03-24 LAB — CBC WITH DIFFERENTIAL/PLATELET
Basophils Absolute: 63 cells/uL (ref 0–200)
Basophils Relative: 1.1 %
Eosinophils Absolute: 91 cells/uL (ref 15–500)
Eosinophils Relative: 1.6 %
HCT: 38.3 % (ref 35.0–45.0)
Hemoglobin: 13.6 g/dL (ref 11.7–15.5)
Lymphs Abs: 1682 cells/uL (ref 850–3900)
MCH: 36.3 pg — ABNORMAL HIGH (ref 27.0–33.0)
MCHC: 35.5 g/dL (ref 32.0–36.0)
MCV: 102.1 fL — AB (ref 80.0–100.0)
MONOS PCT: 8.5 %
MPV: 10.1 fL (ref 7.5–12.5)
NEUTROS PCT: 59.3 %
Neutro Abs: 3380 cells/uL (ref 1500–7800)
PLATELETS: 222 10*3/uL (ref 140–400)
RBC: 3.75 10*6/uL — AB (ref 3.80–5.10)
RDW: 13.7 % (ref 11.0–15.0)
TOTAL LYMPHOCYTE: 29.5 %
WBC: 5.7 10*3/uL (ref 3.8–10.8)
WBCMIX: 485 {cells}/uL (ref 200–950)

## 2018-03-25 NOTE — Progress Notes (Signed)
Labs are stable.

## 2018-05-13 NOTE — Progress Notes (Signed)
Office Visit Note  Patient: Melissa Perez             Date of Birth: 1945-12-30           MRN: [id number]             PCP: Lucianne Lei, MD Referring: Lucianne Lei, MD Visit Date: 05/27/2018 Occupation: @GUAROCC @    Subjective:  Joint stiffness   History of Present Illness: Melissa Perez is a 72 y.o. female with history of seropositive rheumatoid arthritis.  She states she has been doing quite well on combination of methotrexate and Plaquenil.  She experiences minimal morning stiffness with certain activities but no joint swelling.  She has been having lower back pain due to underlying disc disease and has been followed by Dr. Jacelyn Grip.  She states she had some cortisone injections to her SI joints in May 2019.  She had epidurals last year.  Activities of Daily Living:  Patient reports morning stiffness for 2 minutes.   Patient Denies nocturnal pain.  Difficulty dressing/grooming: Denies Difficulty climbing stairs: Denies Difficulty getting out of chair: Reports Difficulty using hands for taps, buttons, cutlery, and/or writing: Denies   Review of Systems  Constitutional: Positive for fatigue. Negative for fever, night sweats, weight gain and weight loss.  HENT: Negative for ear pain, mouth sores, trouble swallowing, trouble swallowing, mouth dryness and nose dryness.   Eyes: Negative for pain, redness, visual disturbance and dryness.  Respiratory: Negative for cough, shortness of breath and difficulty breathing.   Cardiovascular: Negative for chest pain, palpitations, hypertension, irregular heartbeat and swelling in legs/feet.  Gastrointestinal: Negative for blood in stool, constipation and diarrhea.  Endocrine: Negative for increased urination.  Genitourinary: Negative for difficulty urinating and vaginal dryness.  Musculoskeletal: Positive for morning stiffness. Negative for arthralgias, joint pain, joint swelling, myalgias, muscle weakness, muscle tenderness and myalgias.  Skin:  Positive for sensitivity to sunlight. Negative for color change, rash, hair loss, skin tightness and ulcers.  Allergic/Immunologic: Negative for susceptible to infections.  Neurological: Positive for numbness. Negative for dizziness, memory loss, night sweats and weakness.  Hematological: Negative for bruising/bleeding tendency and swollen glands.  Psychiatric/Behavioral: Negative for depressed mood and sleep disturbance. The patient is not nervous/anxious.     PMFS History:  Patient Active Problem List   Diagnosis Date Noted  . DDD (degenerative disc disease), lumbar 05/27/2018  . Essential hypertension 03/19/2017  . History of pityriasis rosea 03/19/2017  . 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 History of malignant neoplasm of breast 03/19/2017  . Rheumatoid arthritis  10/15/2016  . High risk medication use 10/15/2016  . 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 S/P lumpectomy of breast 12/03/2011  . 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[place]) 11/01/2011    Past Medical History:  Diagnosis Date  . Arthritis    rheumatoid, take Humira  . Arthritis    rheumatoid  . Arthritis pain   . Back pain   . Breast cancer ([place])   . Cancer ([place])   . History of dental surgery   . Hyperlipidemia   . Hypertension   . Personal history of radiation therapy   . Rash   . Rheumatoid arthritis ([place])   . S/P lumpectomy of breast 12/03/2011    Family History  Problem Relation Age of Onset  . Breast cancer Mother   . Cancer Mother   . Kidney cancer Father   . Cancer Father   .  Breast cancer Sister   . Cancer Sister   . Lung cancer Sister   . Colon cancer Maternal Uncle   . Liver cancer Paternal Aunt    Past Surgical History:  Procedure Laterality Date  . ABDOMINAL HYSTERECTOMY    . APPENDECTOMY    . BREAST EXCISIONAL BIOPSY Left 1991   benign  . BREAST LUMPECTOMY Right 11/2011  . BREAST SURGERY     2 left breast  biopsies-benign  . left breast biopsies  1991 & 1990   Social History   Social History Narrative   Adult Son in the [place]     Objective: Vital Signs: BP 110/72 (BP Location: Left Arm, Patient Position: Sitting, Cuff Size: Normal)   Pulse 90   Ht 5\' 1"  (1.549 m)   Wt 148 lb (67.1 kg)   BMI 27.96 kg/m    Physical Exam  Constitutional: She is oriented to person, place, and time. She appears well-developed and well-nourished.  HENT:  Head: Normocephalic and atraumatic.  Eyes: Conjunctivae and EOM are normal.  Neck: Normal range of motion.  Cardiovascular: Normal rate, regular rhythm, normal heart sounds and intact distal pulses.  Pulmonary/Chest: Effort normal and breath sounds normal.  Abdominal: Soft. Bowel sounds are normal.  Lymphadenopathy:    She has no cervical adenopathy.  Neurological: She is alert and oriented to person, place, and time.  Skin: Skin is warm and dry. Capillary refill takes less than 2 seconds.  Psychiatric: She has a normal mood and affect. Her behavior is normal.  Nursing note and vitals reviewed.    Musculoskeletal Exam: C-spine thoracic lumbar spine good range of motion.  There was no SI joint tenderness.  Shoulder joints elbow joints wrist joint MCPs PIPs DIPs with good range of motion without synovitis.  Hip joints knee joints ankles MTPs PIPs DIPs in good range of motion with no synovitis.  CDAI Exam: No CDAI exam completed.    Investigation: No additional findings. CBC Latest Ref Rng & Units 03/24/2018 12/20/2017 09/20/2017  WBC 3.8 - 10.8 Thousand/uL 5.7 4.8 7.1  Hemoglobin 11.7 - 15.5 g/dL 13.6 12.8 13.1  Hematocrit 35.0 - 45.0 % 38.3 37.5 38.0  Platelets 140 - 400 Thousand/uL 222 239 236   CMP Latest Ref Rng & Units 03/24/2018 12/20/2017 09/20/2017  Glucose 65 - 99 mg/dL 92 88 95  BUN 7 - 25 mg/dL 19 14 16   Creatinine 0.60 - 0.93 mg/dL 0.93 0.87 0.91  Sodium 135 - 146 mmol/L 141 140 142  Potassium 3.5 - 5.3 mmol/L 4.2 4.5 4.6  Chloride  98 - 110 mmol/L 105 105 102  CO2 20 - 32 mmol/L 30 27 30   Calcium 8.6 - 10.4 mg/dL 9.6 9.6 9.9  Total Protein 6.1 - 8.1 g/dL 6.5 6.4 6.6  Total Bilirubin 0.2 - 1.2 mg/dL 0.5 0.5 0.6  Alkaline Phos 33 - 130 U/L - - -  AST 10 - 35 U/L 27 32 38(H)  ALT 6 - 29 U/L 27 34(H) 40(H)    Imaging: No results found.  Speciality Comments: No specialty comments available.    Procedures:  No procedures performed Allergies: Dilaudid [hydromorphone hcl] and Hydrocodone   Assessment / Plan:     Visit Diagnoses: Rheumatoid arthritis  - +RF,+CCP.  Patient has no synovitis on examination.  She has been doing well on combination of methotrexate and Plaquenil.  She states her last flare was few months ago.  I would not reduce the dose of her medications at this point.  High risk medication use - PLQ 200 mg po qd, MTX 8 tabs po qwk, folic acid 2mg  po qdeye exam 01/2018.  Her labs were normal.  We will continue to monitor her labs every 3 months.  DDD (degenerative disc disease), lumbar-she has been followed by Dr. Mina Marble.  Patient states she had epidural injections last year.  Chronic right SI joint pain-better after the cortisone injection given by Dr. Mina Marble.  Positive PPD, treated - +INH  History of malignant neoplasm of breast - Stage0,s/p RTx11/12,lumpectomy   History of hypertension -her blood pressure is well controlled.  Patient reports getting bone density through Dr. Fransico Setters office.  I have advised her to check with their office if her bone density is due.  Patient gives history of dyslipidemia and diagnosed by Dr. Criss Rosales.  She states she is not taking Crestor.  Dietary management of high lipids was discussed.  Association of heart disease with rheumatoid arthritis was discussed. Need to monitor blood pressure, cholesterol, and to exercise 30-60 minutes on daily basis was discussed. Poor dental hygiene can be a predisposing factor for rheumatoid arthritis. Good dental hygiene was  discussed.Orders: No orders of the defined types were placed in this encounter.  No orders of the defined types were placed in this encounter.   Face-to-face time spent with patient was 30 minutes. >50% of time was spent in counseling and coordination of care.  Follow-Up Instructions: Return in about 5 months (around 10/27/2018) for Rheumatoid arthritis, DDD.   Bo Merino, MD  Note - This record has been created using [employer].  Chart creation errors have been sought, but may not always  have been located. Such creation errors do not reflect on  the standard of medical care.

## 2018-05-26 ENCOUNTER — Other Ambulatory Visit: Payer: Self-pay | Admitting: Rheumatology

## 2018-05-26 NOTE — Telephone Encounter (Signed)
Last Visit: 12/28/17 Next Visit: 05/27/18  Okay to refill per Dr. Estanislado Pandy

## 2018-05-27 ENCOUNTER — Encounter: Payer: Self-pay | Admitting: Rheumatology

## 2018-05-27 ENCOUNTER — Ambulatory Visit: Payer: Medicare Other | Admitting: Rheumatology

## 2018-05-27 ENCOUNTER — Encounter (INDEPENDENT_AMBULATORY_CARE_PROVIDER_SITE_OTHER): Payer: Self-pay

## 2018-05-27 VITALS — BP 110/72 | HR 90 | Ht 61.0 in | Wt 148.0 lb

## 2018-05-27 DIAGNOSIS — Z8679 Personal history of other diseases of the circulatory system: Secondary | ICD-10-CM | POA: Diagnosis not present

## 2018-05-27 DIAGNOSIS — M5136 Other intervertebral disc degeneration, lumbar region: Secondary | ICD-10-CM

## 2018-05-27 DIAGNOSIS — Z853 Personal history of malignant neoplasm of breast: Secondary | ICD-10-CM

## 2018-05-27 DIAGNOSIS — Z79899 Other long term (current) drug therapy: Secondary | ICD-10-CM

## 2018-05-27 DIAGNOSIS — M533 Sacrococcygeal disorders, not elsewhere classified: Secondary | ICD-10-CM

## 2018-05-27 DIAGNOSIS — G8929 Other chronic pain: Secondary | ICD-10-CM

## 2018-05-27 DIAGNOSIS — R7611 Nonspecific reaction to tuberculin skin test without active tuberculosis: Secondary | ICD-10-CM | POA: Diagnosis not present

## 2018-05-27 DIAGNOSIS — M0579 Rheumatoid arthritis with rheumatoid factor of multiple sites without organ or systems involvement: Secondary | ICD-10-CM | POA: Diagnosis not present

## 2018-05-27 DIAGNOSIS — M51369 Other intervertebral disc degeneration, lumbar region without mention of lumbar back pain or lower extremity pain: Secondary | ICD-10-CM

## 2018-05-27 NOTE — Patient Instructions (Addendum)
Standing Labs We placed an order today for your standing lab work.    Please come back and get your standing labs in July and every 3 months  We have open lab Monday through Friday from 8:30-11:30 AM and 1:30-4:00 PM  at the office of Dr. Bo Merino.   You may experience shorter wait times on Monday and Friday afternoons. The office is located at [address] [place], [place], [place] [address] No appointment is necessary.   Labs are drawn by [facility].  You may receive a bill from [facility] for your lab work. If you have any questions regarding directions or hours of operation,  please call [phone].     Association of heart disease with rheumatoid arthritis was discussed. Need to monitor blood pressure, cholesterol, and to exercise 30-60 minutes on daily basis was discussed. Poor dental hygiene can be a predisposing factor for rheumatoid arthritis. Good dental hygiene was discussed.

## 2018-05-28 ENCOUNTER — Other Ambulatory Visit: Payer: Self-pay | Admitting: Rheumatology

## 2018-05-28 NOTE — Telephone Encounter (Signed)
Last Visit: 05/27/18 Next visit: 11/04/18 Labs: 03/24/18 stable   Okay to refill per Dr. Estanislado Pandy

## 2018-06-01 ENCOUNTER — Other Ambulatory Visit: Payer: Self-pay | Admitting: Physician Assistant

## 2018-06-02 NOTE — Telephone Encounter (Signed)
Last Visit: 05/27/18 Next visit: 11/04/18 Labs: 03/24/18 stable PLQ eye exam 01/2018  Okay to refill per Dr. Estanislado Pandy

## 2018-06-13 ENCOUNTER — Telehealth: Payer: Self-pay | Admitting: *Deleted

## 2018-06-13 NOTE — Telephone Encounter (Signed)
Bone Density Scan shows Osteoporosis   T-Score -2.5  Needs to an appointment to discuss.

## 2018-06-20 ENCOUNTER — Other Ambulatory Visit: Payer: Self-pay

## 2018-06-20 ENCOUNTER — Telehealth: Payer: Self-pay | Admitting: Rheumatology

## 2018-06-20 DIAGNOSIS — Z79899 Other long term (current) drug therapy: Secondary | ICD-10-CM

## 2018-06-20 DIAGNOSIS — L299 Pruritus, unspecified: Secondary | ICD-10-CM | POA: Insufficient documentation

## 2018-06-20 NOTE — Telephone Encounter (Signed)
Lab orders have been released and patient has been notified.

## 2018-06-20 NOTE — Telephone Encounter (Signed)
Patient called requesting her labwork orders be sent to [facility] on [address].  Patient states she will be going on Monday 06/23/18.

## 2018-06-23 LAB — CBC WITH DIFFERENTIAL/PLATELET
Basophils Absolute: 59 cells/uL (ref 0–200)
Basophils Relative: 0.9 %
Eosinophils Absolute: 119 cells/uL (ref 15–500)
Eosinophils Relative: 1.8 %
HCT: 36.8 % (ref 35.0–45.0)
Hemoglobin: 13.1 g/dL (ref 11.7–15.5)
Lymphs Abs: 1591 cells/uL (ref 850–3900)
MCH: 35.3 pg — ABNORMAL HIGH (ref 27.0–33.0)
MCHC: 35.6 g/dL (ref 32.0–36.0)
MCV: 99.2 fL (ref 80.0–100.0)
MPV: 10.7 fL (ref 7.5–12.5)
Monocytes Relative: 6.1 %
Neutro Abs: 4429 cells/uL (ref 1500–7800)
Neutrophils Relative %: 67.1 %
Platelets: 204 10*3/uL (ref 140–400)
RBC: 3.71 10*6/uL — ABNORMAL LOW (ref 3.80–5.10)
RDW: 13.8 % (ref 11.0–15.0)
Total Lymphocyte: 24.1 %
WBC: 6.6 10*3/uL (ref 3.8–10.8)
WBCMIX: 403 {cells}/uL (ref 200–950)

## 2018-06-23 LAB — COMPLETE METABOLIC PANEL WITH GFR
AG Ratio: 2 (calc) (ref 1.0–2.5)
ALT: 20 U/L (ref 6–29)
AST: 28 U/L (ref 10–35)
Albumin: 4.4 g/dL (ref 3.6–5.1)
Alkaline phosphatase (APISO): 81 U/L (ref 33–130)
BUN: 17 mg/dL (ref 7–25)
CALCIUM: 9.8 mg/dL (ref 8.6–10.4)
CO2: 28 mmol/L (ref 20–32)
Chloride: 105 mmol/L (ref 98–110)
Creat: 0.85 mg/dL (ref 0.60–0.93)
GFR, Est African American: 80 mL/min/{1.73_m2} (ref >=60)
GFR, Est Non African American: 69 mL/min/{1.73_m2} (ref >=60)
GLOBULIN: 2.2 g/dL (ref 1.9–3.7)
Glucose, Bld: 94 mg/dL (ref 65–139)
Potassium: 3.9 mmol/L (ref 3.5–5.3)
Sodium: 141 mmol/L (ref 135–146)
Total Bilirubin: 0.6 mg/dL (ref 0.2–1.2)
Total Protein: 6.6 g/dL (ref 6.1–8.1)

## 2018-06-24 NOTE — Progress Notes (Signed)
CMP WNL. CBC stable.

## 2018-08-26 ENCOUNTER — Other Ambulatory Visit: Payer: Self-pay | Admitting: Rheumatology

## 2018-08-26 NOTE — Telephone Encounter (Signed)
Last Visit: 05/27/18 Next visit: 11/04/18 Labs: 06/23/18 CMP WNL. CBC stable.  Okay to refill per Dr. Estanislado Pandy

## 2018-09-04 ENCOUNTER — Other Ambulatory Visit: Payer: Self-pay | Admitting: Rheumatology

## 2018-09-04 NOTE — Telephone Encounter (Signed)
Last Visit: 05/27/18 Next visit: 11/04/18 Labs: 06/23/18 CMP WNL. CBC stable.  PLQ eye exam 01/2018  Okay to refill per Dr. Estanislado Pandy

## 2018-09-16 ENCOUNTER — Telehealth: Payer: Self-pay | Admitting: Physician Assistant

## 2018-09-16 NOTE — Telephone Encounter (Signed)
Dr. Fransico Setters office called to discuss osteoporosis treatment options since the patient has a history of RA and is being treated with PLQ and MTX.  Depending on patient preference Fosamax or Prolia would be an option.   According to the last office note the patient does not have a known history of GERD or esophagitis.  DEXA on 06/06/18 T-score -2.5 and BMD 0.871.  PCP will further discuss treatment options with the patient.

## 2018-09-23 ENCOUNTER — Other Ambulatory Visit: Payer: Self-pay

## 2018-09-23 DIAGNOSIS — Z79899 Other long term (current) drug therapy: Secondary | ICD-10-CM

## 2018-09-23 LAB — CBC WITH DIFFERENTIAL/PLATELET
Basophils Absolute: 71 cells/uL (ref 0–200)
Basophils Relative: 1.4 %
EOS PCT: 1.8 %
Eosinophils Absolute: 92 cells/uL (ref 15–500)
HCT: 36.5 % (ref 35.0–45.0)
Hemoglobin: 12.8 g/dL (ref 11.7–15.5)
LYMPHS ABS: 1637 {cells}/uL (ref 850–3900)
MCH: 35.2 pg — ABNORMAL HIGH (ref 27.0–33.0)
MCHC: 35.1 g/dL (ref 32.0–36.0)
MCV: 100.3 fL — ABNORMAL HIGH (ref 80.0–100.0)
MPV: 10.4 fL (ref 7.5–12.5)
Monocytes Relative: 8.5 %
NEUTROS PCT: 56.2 %
Neutro Abs: 2866 cells/uL (ref 1500–7800)
PLATELETS: 247 10*3/uL (ref 140–400)
RBC: 3.64 10*6/uL — AB (ref 3.80–5.10)
RDW: 14 % (ref 11.0–15.0)
Total Lymphocyte: 32.1 %
WBC mixed population: 434 cells/uL (ref 200–950)
WBC: 5.1 10*3/uL (ref 3.8–10.8)

## 2018-09-23 LAB — COMPLETE METABOLIC PANEL WITH GFR
AG RATIO: 2.1 (calc) (ref 1.0–2.5)
ALBUMIN MSPROF: 4.4 g/dL (ref 3.6–5.1)
ALKALINE PHOSPHATASE (APISO): 74 U/L (ref 33–130)
ALT: 23 U/L (ref 6–29)
AST: 30 U/L (ref 10–35)
BUN: 14 mg/dL (ref 7–25)
CALCIUM: 9.7 mg/dL (ref 8.6–10.4)
CO2: 28 mmol/L (ref 20–32)
CREATININE: 0.79 mg/dL (ref 0.60–0.93)
Chloride: 101 mmol/L (ref 98–110)
GFR, EST NON AFRICAN AMERICAN: 76 mL/min/{1.73_m2} (ref >=60)
GFR, Est African American: 88 mL/min/{1.73_m2} (ref >=60)
GLOBULIN: 2.1 g/dL (ref 1.9–3.7)
Glucose, Bld: 111 mg/dL (ref 65–139)
POTASSIUM: 4.3 mmol/L (ref 3.5–5.3)
SODIUM: 139 mmol/L (ref 135–146)
Total Bilirubin: 0.5 mg/dL (ref 0.2–1.2)
Total Protein: 6.5 g/dL (ref 6.1–8.1)

## 2018-09-23 NOTE — Progress Notes (Signed)
CBC stable

## 2018-09-24 NOTE — Progress Notes (Signed)
CMP WNL

## 2018-10-21 NOTE — Progress Notes (Signed)
Office Visit Note  Patient: Melissa Perez             Date of Birth: 10-12-1946           MRN: [id number]             PCP: Lucianne Lei, MD Referring: Lucianne Lei, MD Visit Date: 11/04/2018 Occupation: @GUAROCC @  Subjective:  Bilateral shoulder discomfort    History of Present Illness: Melissa Perez is a 72 y.o. female with history of seropositive rheumatoid arthritis.  She is on PLQ 200 mg 1 tablet by mouth daily, MTX 8 tablets by mouth once weekly, and folic acid 2 mg by mouth daily.  She denies any recent rheumatoid arthritis flares.  She reports she has been having discomfort in both shoulder joints intermittently.  She denies any other joint pain or joint swelling.  She reports she has occasional lower back pain which is improving since having a steroid injection several weeks ago.  She continues to walk 4-5 times per week.  She has had the seasonal influenza vaccination.     Activities of Daily Living:  Patient reports morning stiffness for 2 minutes.   Patient Denies nocturnal pain.  Difficulty dressing/grooming: Denies Difficulty climbing stairs: Denies Difficulty getting out of chair: Reports Difficulty using hands for taps, buttons, cutlery, and/or writing: Denies  Review of Systems  Constitutional: Positive for fatigue.  HENT: Negative for mouth sores, mouth dryness and nose dryness.   Eyes: Negative for pain, visual disturbance and dryness.  Respiratory: Negative for cough, hemoptysis, shortness of breath and difficulty breathing.   Cardiovascular: Negative for chest pain, palpitations, hypertension and swelling in legs/feet.  Gastrointestinal: Negative for blood in stool, constipation and diarrhea.  Endocrine: Negative for increased urination.  Genitourinary: Negative for painful urination.  Musculoskeletal: Positive for arthralgias, joint pain and morning stiffness. Negative for joint swelling, myalgias, muscle weakness, muscle tenderness and myalgias.  Skin: Negative  for color change, pallor, rash, hair loss, nodules/bumps, skin tightness, ulcers and sensitivity to sunlight.  Allergic/Immunologic: Negative for susceptible to infections.  Neurological: Negative for dizziness, numbness, headaches and weakness.  Hematological: Negative for swollen glands.  Psychiatric/Behavioral: Negative for depressed mood and sleep disturbance. The patient is not nervous/anxious.     PMFS History:  Patient Active Problem List   Diagnosis Date Noted  . DDD (degenerative disc disease), lumbar 05/27/2018  . Essential hypertension 03/19/2017  . History of pityriasis rosea 03/19/2017  . 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 History of malignant neoplasm of breast 03/19/2017  . Rheumatoid arthritis  10/15/2016  . High risk medication use 10/15/2016  . 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 S/P lumpectomy of breast 12/03/2011  . 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[place]) 11/01/2011    Past Medical History:  Diagnosis Date  . Arthritis    rheumatoid, take Humira  . Arthritis    rheumatoid  . Arthritis pain   . Back pain   . Breast cancer ([place])   . Cancer ([place])   . History of dental surgery   . Hyperlipidemia   . Hypertension   . Personal history of radiation therapy   . Rash   . Rheumatoid arthritis ([place])   . S/P lumpectomy of breast 12/03/2011    Family History  Problem Relation Age of Onset  . Breast cancer Mother   . Cancer Mother   . Kidney cancer Father   . Cancer Father   .  Breast cancer Sister   . Cancer Sister   . Lung cancer Sister   . Colon cancer Maternal Uncle   . Liver cancer Paternal Aunt    Past Surgical History:  Procedure Laterality Date  . ABDOMINAL HYSTERECTOMY    . APPENDECTOMY    . BREAST EXCISIONAL BIOPSY Left 1991   benign  . BREAST LUMPECTOMY Right 11/2011  . BREAST SURGERY     2 left breast biopsies-benign  . left breast biopsies  [place] History Narrative   Adult Son in the [place]    Objective: Vital Signs: BP 122/72 (BP Location: Left Arm, Patient Position: Sitting, Cuff Size: Normal)   Pulse 82   Resp 14   Ht 5' 1.5" (1.562 m)   Wt 149 lb 12.8 oz (67.9 kg)   BMI 27.85 kg/m    Physical Exam  Constitutional: She is oriented to person, place, and time. She appears well-developed and well-nourished.  HENT:  Head: Normocephalic and atraumatic.  Eyes: Conjunctivae and EOM are normal.  Neck: Normal range of motion.  Cardiovascular: Normal rate, regular rhythm, normal heart sounds and intact distal pulses.  Pulmonary/Chest: Effort normal and breath sounds normal.  Abdominal: Soft. Bowel sounds are normal.  Lymphadenopathy:    She has no cervical adenopathy.  Neurological: She is alert and oriented to person, place, and time.  Skin: Skin is warm and dry. Capillary refill takes less than 2 seconds.  Psychiatric: She has a normal mood and affect. Her behavior is normal.  Nursing note and vitals reviewed.    Musculoskeletal Exam: C-spine, thoracic spine, and lumbar spine good ROM.  No midline spinal tenderness.  No SI joint tenderness.  Shoulder joints full ROM with some discomfort.  Bilateral shoulder joint crepitus.  Elbow joints, wrist joints, MCPs, PIPs, and DIPs good ROM with no synovitis. Complete fist formation bilaterally.  Hip joints, knee joints, ankle joints, MTPs, PIPs, and DIPs good ROM with no synovitis.  No warmth or effusion of knee joints.  No tenderness or swelling of ankle joints. No tenderness of trochanteric bursa bilaterally.   CDAI Exam: CDAI Score: 0.4  Patient Global Assessment: 2 (mm); Provider Global Assessment: 2 (mm) Swollen: 0 ; Tender: 0  Joint Exam   Not documented   There is currently no information documented on the homunculus. Go to the Rheumatology activity and complete the homunculus joint exam.  Investigation: No additional findings.  Imaging: No results  found.  Recent Labs: Lab Results  Component Value Date   WBC 5.1 09/23/2018   HGB 12.8 09/23/2018   PLT 247 09/23/2018   NA 139 09/23/2018   K 4.3 09/23/2018   CL 101 09/23/2018   CO2 28 09/23/2018   GLUCOSE 111 09/23/2018   BUN 14 09/23/2018   CREATININE 0.79 09/23/2018   BILITOT 0.5 09/23/2018   ALKPHOS 53 06/20/2017   AST 30 09/23/2018   ALT 23 09/23/2018   PROT 6.5 09/23/2018   ALBUMIN 3.8 06/20/2017   CALCIUM 9.7 09/23/2018   GFRAA 88 09/23/2018    Speciality Comments: No specialty comments available.  Procedures:  No procedures performed Allergies: Dilaudid [hydromorphone hcl] and Hydrocodone     Assessment / Plan:     Visit Diagnoses: Rheumatoid arthritis  - +RF, +CCP: She has no synovitis on exam.  She is clinically doing well on Methotrexate 8 tablets by mouth once weekly, folic acid 2 mg po daily, and Plaquenil 200 mg 1 tablet by  mouth daily.  She has not had any recent rheumatoid arthritis flares.    Chronic pain in both shoulders-She has intermittent bilateral shoulder discomfort.  She has full ROM on exam with bilateral shoulder joint crepitus.  She has a prescription for voltaren gel which she can apply topically as needed.  She will continue on the current treatment regimen.  She does not need any refills today.  She was advised to notify us if she develops increased joint pain or joint swelling.  She will follow up in 5 months.  Handout on shoulder joint exercises was given.  High risk medication use -  PLQ 200 mg po qd, MTX 8 tabs po qwk, folic acid 2mg  po qdeye exam 01/2018.   She has had the seasonal influenza vaccination. CBC/CMP stable on 09/23/18. She is due for CBC and CMP in January and every 3 months.  Standing orders are in place.   DDD (degenerative disc disease), lumbar - Dr. Mina Marble.  She had steroid injections recently which have provided significant pain relief.  She has no midline spinal tenderness on exam today. She has no symptoms of  radiculopathy at this time.   Chronic right SI joint pain: No SI joint tenderness on exam today.   Other medical conditions are listed as follows:   Morton's neuroma, unspecified laterality  History of malignant neoplasm of breast - Stage0,s/p RTx11/12,lumpectomy   Positive PPD, treated - Tx with INH  History of hypertension  History of pityriasis rosea  Orders: No orders of the defined types were placed in this encounter.  No orders of the defined types were placed in this encounter.     Follow-Up Instructions: Return in about 5 months (around 04/05/2019) for Rheumatoid arthritis, DDD.   Hazel Sams PA-C  Note - This record has been created using [employer].  Chart creation errors have been sought, but may not always  have been located. Such creation errors do not reflect on  the standard of medical care.

## 2018-11-04 ENCOUNTER — Encounter: Payer: Self-pay | Admitting: Physician Assistant

## 2018-11-04 ENCOUNTER — Ambulatory Visit: Payer: Medicare Other | Admitting: Physician Assistant

## 2018-11-04 VITALS — BP 122/72 | HR 82 | Resp 14 | Ht 61.5 in | Wt 149.8 lb

## 2018-11-04 DIAGNOSIS — Z853 Personal history of malignant neoplasm of breast: Secondary | ICD-10-CM

## 2018-11-04 DIAGNOSIS — G8929 Other chronic pain: Secondary | ICD-10-CM

## 2018-11-04 DIAGNOSIS — Z8679 Personal history of other diseases of the circulatory system: Secondary | ICD-10-CM

## 2018-11-04 DIAGNOSIS — M0579 Rheumatoid arthritis with rheumatoid factor of multiple sites without organ or systems involvement: Secondary | ICD-10-CM | POA: Diagnosis not present

## 2018-11-04 DIAGNOSIS — M25512 Pain in left shoulder: Secondary | ICD-10-CM

## 2018-11-04 DIAGNOSIS — M533 Sacrococcygeal disorders, not elsewhere classified: Secondary | ICD-10-CM

## 2018-11-04 DIAGNOSIS — M25511 Pain in right shoulder: Secondary | ICD-10-CM

## 2018-11-04 DIAGNOSIS — M5136 Other intervertebral disc degeneration, lumbar region: Secondary | ICD-10-CM | POA: Diagnosis not present

## 2018-11-04 DIAGNOSIS — G5760 Lesion of plantar nerve, unspecified lower limb: Secondary | ICD-10-CM

## 2018-11-04 DIAGNOSIS — Z872 Personal history of diseases of the skin and subcutaneous tissue: Secondary | ICD-10-CM

## 2018-11-04 DIAGNOSIS — Z79899 Other long term (current) drug therapy: Secondary | ICD-10-CM | POA: Diagnosis not present

## 2018-11-04 DIAGNOSIS — R7611 Nonspecific reaction to tuberculin skin test without active tuberculosis: Secondary | ICD-10-CM

## 2018-11-04 NOTE — Patient Instructions (Addendum)
Standing Labs We placed an order today for your standing lab work.    Please come back and get your standing labs in January and every 3 months   We have open lab Monday through Friday from 8:30-11:30 AM and 1:30-4:00 PM  at the office of Dr. Bo Merino.   You may experience shorter wait times on Monday and Friday afternoons. The office is located at [address] [place], [place], [place] [address] No appointment is necessary.   Labs are drawn by [facility].  You may receive a bill from [facility] for your lab work. If you have any questions regarding directions or hours of operation,  please call [phone].   Just as a reminder please drink plenty of water prior to coming for your lab work. Thanks!   Shoulder Exercises Ask your health care provider which exercises are safe for you. Do exercises exactly as told by your health care provider and adjust them as directed. It is normal to feel mild stretching, pulling, tightness, or discomfort as you do these exercises, but you should stop right away if you feel sudden pain or your pain gets worse.Do not begin these exercises until told by your health care provider. RANGE OF MOTION EXERCISES These exercises warm up your muscles and joints and improve the movement and flexibility of your shoulder. These exercises also help to relieve pain, numbness, and tingling. These exercises involve stretching your injured shoulder directly. Exercise A: Pendulum  1. Stand near a wall or a surface that you can hold onto for balance. 2. Bend at the waist and let your left / right arm hang straight down. Use your other arm to support you. Keep your back straight and do not lock your knees. 3. Relax your left / right arm and shoulder muscles, and move your hips and your trunk so your left / right arm swings freely. Your arm should swing because of the motion of your body, not because you are using your arm or shoulder muscles. 4. Keep moving your body so  your arm swings in the following directions, as told by your health care provider: ? Side to side. ? Forward and backward. ? In clockwise and counterclockwise circles. 5. Continue each motion for __________ seconds, or for as long as told by your health care provider. 6. Slowly return to the starting position. Repeat __________ times. Complete this exercise __________ times a day. Exercise B:Flexion, Standing  1. Stand and hold a broomstick, a cane, or a similar object. Place your hands a little more than shoulder-width apart on the object. Your left / right hand should be palm-up, and your other hand should be palm-down. 2. Keep your elbow straight and keep your shoulder muscles relaxed. Push the stick down with your healthy arm to raise your left / right arm in front of your body, and then over your head until you feel a stretch in your shoulder. ? Avoid shrugging your shoulder while you raise your arm. Keep your shoulder blade tucked down toward the middle of your back. 3. Hold for __________ seconds. 4. Slowly return to the starting position. Repeat __________ times. Complete this exercise __________ times a day. Exercise C: Abduction, Standing 1. Stand and hold a broomstick, a cane, or a similar object. Place your hands a little more than shoulder-width apart on the object. Your left / right hand should be palm-up, and your other hand should be palm-down. 2. While keeping your elbow straight and your shoulder muscles relaxed, push  the stick across your body toward your left / right side. Raise your left / right arm to the side of your body and then over your head until you feel a stretch in your shoulder. ? Do not raise your arm above shoulder height, unless your health care provider tells you to do that. ? Avoid shrugging your shoulder while you raise your arm. Keep your shoulder blade tucked down toward the middle of your back. 3. Hold for __________ seconds. 4. Slowly return to the  starting position. Repeat __________ times. Complete this exercise __________ times a day. Exercise D:Internal Rotation  1. Place your left / right hand behind your back, palm-up. 2. Use your other hand to dangle an exercise band, a towel, or a similar object over your shoulder. Grasp the band with your left / right hand so you are holding onto both ends. 3. Gently pull up on the band until you feel a stretch in the front of your left / right shoulder. ? Avoid shrugging your shoulder while you raise your arm. Keep your shoulder blade tucked down toward the middle of your back. 4. Hold for __________ seconds. 5. Release the stretch by letting go of the band and lowering your hands. Repeat __________ times. Complete this exercise __________ times a day. STRETCHING EXERCISES These exercises warm up your muscles and joints and improve the movement and flexibility of your shoulder. These exercises also help to relieve pain, numbness, and tingling. These exercises are done using your healthy shoulder to help stretch the muscles of your injured shoulder. Exercise E: Warehouse manager (External Rotation and Abduction)  1. Stand in a doorway with one of your feet slightly in front of the other. This is called a staggered stance. If you cannot reach your forearms to the door frame, stand facing a corner of a room. 2. Choose one of the following positions as told by your health care provider: ? Place your hands and forearms on the door frame above your head. ? Place your hands and forearms on the door frame at the height of your head. ? Place your hands on the door frame at the height of your elbows. 3. Slowly move your weight onto your front foot until you feel a stretch across your chest and in the front of your shoulders. Keep your head and chest upright and keep your abdominal muscles tight. 4. Hold for __________ seconds. 5. To release the stretch, shift your weight to your back foot. Repeat  __________ times. Complete this stretch __________ times a day. Exercise F:Extension, Standing 1. Stand and hold a broomstick, a cane, or a similar object behind your back. ? Your hands should be a little wider than shoulder-width apart. ? Your palms should face away from your back. 2. Keeping your elbows straight and keeping your shoulder muscles relaxed, move the stick away from your body until you feel a stretch in your shoulder. ? Avoid shrugging your shoulders while you move the stick. Keep your shoulder blade tucked down toward the middle of your back. 3. Hold for __________ seconds. 4. Slowly return to the starting position. Repeat __________ times. Complete this exercise __________ times a day. STRENGTHENING EXERCISES These exercises build strength and endurance in your shoulder. Endurance is the ability to use your muscles for a long time, even after they get tired. Exercise G:External Rotation  1. Sit in a stable chair without armrests. 2. Secure an exercise band at elbow height on your left / right side. 3.  Place a soft object, such as a folded towel or a small pillow, between your left / right upper arm and your body to move your elbow a few inches away (about 10 cm) from your side. 4. Hold the end of the band so it is tight and there is no slack. 5. Keeping your elbow pressed against the soft object, move your left / right forearm out, away from your abdomen. Keep your body steady so only your forearm moves. 6. Hold for __________ seconds. 7. Slowly return to the starting position. Repeat __________ times. Complete this exercise __________ times a day. Exercise H:Shoulder Abduction  1. Sit in a stable chair without armrests, or stand. 2. Hold a __________ weight in your left / right hand, or hold an exercise band with both hands. 3. Start with your arms straight down and your left / right palm facing in, toward your body. 4. Slowly lift your left / right hand out to your  side. Do not lift your hand above shoulder height unless your health care provider tells you that this is safe. ? Keep your arms straight. ? Avoid shrugging your shoulder while you do this movement. Keep your shoulder blade tucked down toward the middle of your back. 5. Hold for __________ seconds. 6. Slowly lower your arm, and return to the starting position. Repeat __________ times. Complete this exercise __________ times a day. Exercise I:Shoulder Extension 1. Sit in a stable chair without armrests, or stand. 2. Secure an exercise band to a stable object in front of you where it is at shoulder height. 3. Hold one end of the exercise band in each hand. Your palms should face each other. 4. Straighten your elbows and lift your hands up to shoulder height. 5. Step back, away from the secured end of the exercise band, until the band is tight and there is no slack. 6. Squeeze your shoulder blades together as you pull your hands down to the sides of your thighs. Stop when your hands are straight down by your sides. Do not let your hands go behind your body. 7. Hold for __________ seconds. 8. Slowly return to the starting position. Repeat __________ times. Complete this exercise __________ times a day. Exercise J:Standing Shoulder Row 1. Sit in a stable chair without armrests, or stand. 2. Secure an exercise band to a stable object in front of you so it is at waist height. 3. Hold one end of the exercise band in each hand. Your palms should be in a thumbs-up position. 4. Bend each of your elbows to an "L" shape (about 90 degrees) and keep your upper arms at your sides. 5. Step back until the band is tight and there is no slack. 6. Slowly pull your elbows back behind you. 7. Hold for __________ seconds. 8. Slowly return to the starting position. Repeat __________ times. Complete this exercise __________ times a day. Exercise K:Shoulder Press-Ups  1. Sit in a stable chair that has armrests.  Sit upright, with your feet flat on the floor. 2. Put your hands on the armrests so your elbows are bent and your fingers are pointing forward. Your hands should be about even with the sides of your body. 3. Push down on the armrests and use your arms to lift yourself off of the chair. Straighten your elbows and lift yourself up as much as you comfortably can. ? Move your shoulder blades down, and avoid letting your shoulders move up toward your ears. ? Keep your  feet on the ground. As you get stronger, your feet should support less of your body weight as you lift yourself up. 4. Hold for __________ seconds. 5. Slowly lower yourself back into the chair. Repeat __________ times. Complete this exercise __________ times a day. Exercise L: Wall Push-Ups  1. Stand so you are facing a stable wall. Your feet should be about one arm-length away from the wall. 2. Lean forward and place your palms on the wall at shoulder height. 3. Keep your feet flat on the floor as you bend your elbows and lean forward toward the wall. 4. Hold for __________ seconds. 5. Straighten your elbows to push yourself back to the starting position. Repeat __________ times. Complete this exercise __________ times a day. This information is not intended to replace advice given to you by your health care provider. Make sure you discuss any questions you have with your health care provider. Document Released: 10/03/2005 Document Revised: 08/13/2016 Document Reviewed: 07/31/2015 Elsevier Interactive Patient Education  2018 [employer].

## 2018-11-13 ENCOUNTER — Other Ambulatory Visit: Payer: Self-pay | Admitting: *Deleted

## 2018-11-13 MED ORDER — METHOTREXATE 2.5 MG PO TABS: 20.0000 mg | ORAL_TABLET | ORAL | 0 refills | Status: DC

## 2018-11-13 NOTE — Telephone Encounter (Signed)
Last Visit: 11/04/18 Next Visit: 04/06/19  Labs: 09/23/18 CBC stable and CMP WNL  Okay to refill per Dr. Estanislado Pandy

## 2018-11-18 ENCOUNTER — Other Ambulatory Visit: Payer: Self-pay | Admitting: Rheumatology

## 2018-11-18 NOTE — Telephone Encounter (Signed)
Last Visit: 11/04/18 Next Visit: 04/06/19   Okay to refill per Dr. Estanislado Pandy

## 2018-12-01 ENCOUNTER — Other Ambulatory Visit: Payer: Self-pay | Admitting: Rheumatology

## 2018-12-01 NOTE — Telephone Encounter (Signed)
Last Visit: 11/04/18 Next Visit: 04/06/19  Labs: 09/23/18 CBC stable and CMP WNL eye exam 01/2018  Okay to refill per Dr. Estanislado Pandy

## 2018-12-23 ENCOUNTER — Telehealth: Payer: Self-pay | Admitting: Rheumatology

## 2018-12-23 DIAGNOSIS — Z79899 Other long term (current) drug therapy: Secondary | ICD-10-CM

## 2018-12-23 NOTE — Telephone Encounter (Signed)
Lab orders released.  

## 2018-12-23 NOTE — Telephone Encounter (Signed)
Patient called requesting labwork orders be sent to [facility].  Patient states she will be going tomorrow morning 12/24/18.

## 2018-12-25 LAB — COMPLETE METABOLIC PANEL WITH GFR
AG RATIO: 1.9 (calc) (ref 1.0–2.5)
ALBUMIN MSPROF: 4.5 g/dL (ref 3.6–5.1)
ALT: 28 U/L (ref 6–29)
AST: 30 U/L (ref 10–35)
Alkaline phosphatase (APISO): 84 U/L (ref 33–130)
BILIRUBIN TOTAL: 0.5 mg/dL (ref 0.2–1.2)
BUN / CREAT RATIO: 15 (calc) (ref 6–22)
BUN: 15 mg/dL (ref 7–25)
CO2: 29 mmol/L (ref 20–32)
Calcium: 10 mg/dL (ref 8.6–10.4)
Chloride: 101 mmol/L (ref 98–110)
Creat: 0.99 mg/dL — ABNORMAL HIGH (ref 0.60–0.93)
GFR, EST AFRICAN AMERICAN: 67 mL/min/{1.73_m2} (ref >=60)
GFR, Est Non African American: 58 mL/min/{1.73_m2} — ABNORMAL LOW (ref >=60)
Globulin: 2.4 g/dL (calc) (ref 1.9–3.7)
Glucose, Bld: 82 mg/dL (ref 65–139)
POTASSIUM: 4 mmol/L (ref 3.5–5.3)
SODIUM: 141 mmol/L (ref 135–146)
Total Protein: 6.9 g/dL (ref 6.1–8.1)

## 2018-12-25 LAB — CBC WITH DIFFERENTIAL/PLATELET
ABSOLUTE MONOCYTES: 592 {cells}/uL (ref 200–950)
Basophils Absolute: 89 cells/uL (ref 0–200)
Basophils Relative: 1.2 %
Eosinophils Absolute: 281 cells/uL (ref 15–500)
Eosinophils Relative: 3.8 %
HEMATOCRIT: 37.5 % (ref 35.0–45.0)
Hemoglobin: 13.1 g/dL (ref 11.7–15.5)
LYMPHS ABS: 1561 {cells}/uL (ref 850–3900)
MCH: 35.8 pg — AB (ref 27.0–33.0)
MCHC: 34.9 g/dL (ref 32.0–36.0)
MCV: 102.5 fL — ABNORMAL HIGH (ref 80.0–100.0)
MPV: 10.1 fL (ref 7.5–12.5)
Monocytes Relative: 8 %
NEUTROS PCT: 65.9 %
Neutro Abs: 4877 cells/uL (ref 1500–7800)
PLATELETS: 227 10*3/uL (ref 140–400)
RBC: 3.66 10*6/uL — AB (ref 3.80–5.10)
RDW: 14.3 % (ref 11.0–15.0)
TOTAL LYMPHOCYTE: 21.1 %
WBC: 7.4 10*3/uL (ref 3.8–10.8)

## 2018-12-25 NOTE — Telephone Encounter (Signed)
Creatinine is borderline elevated. Please advise patient to reduce to MTX 7 tablets po once weekly.  We will continue to monitor.  RBC count is low.  MCV and MCH are elevated.  Please make sure patient is taking folic acid 2 mg po daily

## 2018-12-30 ENCOUNTER — Telehealth: Payer: Self-pay | Admitting: *Deleted

## 2018-12-30 MED ORDER — METHOTREXATE 2.5 MG PO TABS: 17.5000 mg | ORAL_TABLET | ORAL | 0 refills | Status: DC

## 2018-12-30 NOTE — Telephone Encounter (Signed)
-----   Message from Ofilia Neas, PA-C sent at 12/25/2018 12:39 PM EST ----- Creatinine is borderline elevated. Please advise patient to reduce to MTX 7 tablets po once weekly.  We will continue to monitor.  RBC count is low.  MCV and MCH are elevated.  Please make sure patient is taking folic acid 2 mg po daily

## 2019-01-20 ENCOUNTER — Other Ambulatory Visit: Payer: Self-pay | Admitting: Hematology and Oncology

## 2019-01-20 ENCOUNTER — Other Ambulatory Visit: Payer: Self-pay | Admitting: Family Medicine

## 2019-01-20 DIAGNOSIS — Z1231 Encounter for screening mammogram for malignant neoplasm of breast: Secondary | ICD-10-CM

## 2019-01-27 ENCOUNTER — Other Ambulatory Visit (HOSPITAL_COMMUNITY): Payer: Self-pay | Admitting: *Deleted

## 2019-01-28 ENCOUNTER — Ambulatory Visit (HOSPITAL_COMMUNITY)
Admission: RE | Admit: 2019-01-28 | Discharge: 2019-01-28 | Disposition: A | Discharge status: Home / Self Care | Payer: Medicare Other | Source: Ambulatory Visit | Attending: Family Medicine | Admitting: Family Medicine

## 2019-01-28 DIAGNOSIS — M818 Other osteoporosis without current pathological fracture: Secondary | ICD-10-CM | POA: Diagnosis present

## 2019-01-28 MED ORDER — DENOSUMAB 60 MG/ML ~~LOC~~ SOSY
60.0000 mg | PREFILLED_SYRINGE | Freq: Once | SUBCUTANEOUS | Status: AC
  Administered 2019-01-28: 60 mg via SUBCUTANEOUS

## 2019-01-28 MED ORDER — DENOSUMAB 60 MG/ML ~~LOC~~ SOSY
PREFILLED_SYRINGE | SUBCUTANEOUS | Status: AC
  Filled 2019-01-28: qty 1

## 2019-01-28 NOTE — Discharge Instructions (Signed)
Denosumab injection °What is this medicine? °DENOSUMAB (den oh sue mab) slows bone breakdown. Prolia is used to treat osteoporosis in women after menopause and in men, and in people who are taking corticosteroids for 6 months or more. Xgeva is used to treat a high calcium level due to cancer and to prevent bone fractures and other bone problems caused by multiple myeloma or cancer bone metastases. Xgeva is also used to treat giant cell tumor of the bone. °This medicine may be used for other purposes; ask your health care provider or pharmacist if you have questions. °COMMON BRAND NAME(S): Prolia, XGEVA °What should I tell my health care provider before I take this medicine? °They need to know if you have any of these conditions: °-dental disease °-having surgery or tooth extraction °-infection °-kidney disease °-low levels of calcium or Vitamin D in the blood °-malnutrition °-on hemodialysis °-skin conditions or sensitivity °-thyroid or parathyroid disease °-an unusual reaction to denosumab, other medicines, foods, dyes, or preservatives °-pregnant or trying to get pregnant °-breast-feeding °How should I use this medicine? °This medicine is for injection under the skin. It is given by a health care professional in a hospital or clinic setting. °A special MedGuide will be given to you before each treatment. Be sure to read this information carefully each time. °For Prolia, talk to your pediatrician regarding the use of this medicine in children. Special care may be needed. For Xgeva, talk to your pediatrician regarding the use of this medicine in children. While this drug may be prescribed for children as young as 13 years for selected conditions, precautions do apply. °Overdosage: If you think you have taken too much of this medicine contact a poison control center or emergency room at once. °NOTE: This medicine is only for you. Do not share this medicine with others. °What if I miss a dose? °It is important not to  miss your dose. Call your doctor or health care professional if you are unable to keep an appointment. °What may interact with this medicine? °Do not take this medicine with any of the following medications: °-other medicines containing denosumab °This medicine may also interact with the following medications: °-medicines that lower your chance of fighting infection °-steroid medicines like prednisone or cortisone °This list may not describe all possible interactions. Give your health care provider a list of all the medicines, herbs, non-prescription drugs, or dietary supplements you use. Also tell them if you smoke, drink alcohol, or use illegal drugs. Some items may interact with your medicine. °What should I watch for while using this medicine? °Visit your doctor or health care professional for regular checks on your progress. Your doctor or health care professional may order blood tests and other tests to see how you are doing. °Call your doctor or health care professional for advice if you get a fever, chills or sore throat, or other symptoms of a cold or flu. Do not treat yourself. This drug may decrease your body's ability to fight infection. Try to avoid being around people who are sick. °You should make sure you get enough calcium and vitamin D while you are taking this medicine, unless your doctor tells you not to. Discuss the foods you eat and the vitamins you take with your health care professional. °See your dentist regularly. Brush and floss your teeth as directed. Before you have any dental work done, tell your dentist you are receiving this medicine. °Do not become pregnant while taking this medicine or for 5 months   after stopping it. Talk with your doctor or health care professional about your birth control options while taking this medicine. Women should inform their doctor if they wish to become pregnant or think they might be pregnant. There is a potential for serious side effects to an unborn  child. Talk to your health care professional or pharmacist for more information. °What side effects may I notice from receiving this medicine? °Side effects that you should report to your doctor or health care professional as soon as possible: °-allergic reactions like skin rash, itching or hives, swelling of the face, lips, or tongue °-bone pain °-breathing problems °-dizziness °-jaw pain, especially after dental work °-redness, blistering, peeling of the skin °-signs and symptoms of infection like fever or chills; cough; sore throat; pain or trouble passing urine °-signs of low calcium like fast heartbeat, muscle cramps or muscle pain; pain, tingling, numbness in the hands or feet; seizures °-unusual bleeding or bruising °-unusually weak or tired °Side effects that usually do not require medical attention (report to your doctor or health care professional if they continue or are bothersome): °-constipation °-diarrhea °-headache °-joint pain °-loss of appetite °-muscle pain °-runny nose °-tiredness °-upset stomach °This list may not describe all possible side effects. Call your doctor for medical advice about side effects. You may report side effects to FDA at 1-800-FDA-1088. °Where should I keep my medicine? °This medicine is only given in a clinic, doctor's office, or other health care setting and will not be stored at home. °NOTE: This sheet is a summary. It may not cover all possible information. If you have questions about this medicine, talk to your doctor, pharmacist, or health care provider. °© 2019 Elsevier/[employer] Standard (2018-03-28 16:10:44) ° °

## 2019-01-31 ENCOUNTER — Other Ambulatory Visit: Payer: Self-pay | Admitting: Rheumatology

## 2019-02-02 NOTE — Telephone Encounter (Signed)
Last Visit: 11/04/18 Next Visit: 04/06/19  Labs:12/24/18 Creatinine is borderline elevated RBC count is low. MCV and MCH are elevated.  Okay to refill per Dr. Estanislado Pandy

## 2019-02-17 ENCOUNTER — Ambulatory Visit
Admission: RE | Admit: 2019-02-17 | Discharge: 2019-02-17 | Disposition: A | Discharge status: Home / Self Care | Payer: Medicare Other | Source: Ambulatory Visit | Attending: Family Medicine | Admitting: Family Medicine

## 2019-02-17 DIAGNOSIS — Z1231 Encounter for screening mammogram for malignant neoplasm of breast: Secondary | ICD-10-CM

## 2019-02-22 ENCOUNTER — Other Ambulatory Visit: Payer: Self-pay | Admitting: Rheumatology

## 2019-02-23 NOTE — Telephone Encounter (Signed)
Last Visit: 11/04/2018 Next Visit: 04/06/2019 Labs: 12/24/2018 Creatinine is borderline elevated.RBC count is low. MCV and MCH are elevated Eye exam: 01/2018  Okay to refill per Dr. Estanislado Pandy.

## 2019-03-24 ENCOUNTER — Other Ambulatory Visit: Payer: Self-pay

## 2019-03-24 ENCOUNTER — Telehealth: Payer: Self-pay | Admitting: Rheumatology

## 2019-03-24 DIAGNOSIS — Z79899 Other long term (current) drug therapy: Secondary | ICD-10-CM

## 2019-03-24 NOTE — Telephone Encounter (Signed)
Patient going to [facility] on [address] for labs. Please release orders.

## 2019-03-24 NOTE — Telephone Encounter (Signed)
Lab orders have been released for [facility].  

## 2019-03-26 LAB — COMPLETE METABOLIC PANEL WITH GFR
AG Ratio: 2.2 (calc) (ref 1.0–2.5)
ALT: 21 U/L (ref 6–29)
AST: 26 U/L (ref 10–35)
Albumin: 4.3 g/dL (ref 3.6–5.1)
Alkaline phosphatase (APISO): 65 U/L (ref 37–153)
BUN: 10 mg/dL (ref 7–25)
CO2: 27 mmol/L (ref 20–32)
Calcium: 9.5 mg/dL (ref 8.6–10.4)
Chloride: 104 mmol/L (ref 98–110)
Creat: 0.78 mg/dL (ref 0.60–0.93)
GFR, Est African American: 89 mL/min/{1.73_m2} (ref >=60)
GFR, Est Non African American: 76 mL/min/{1.73_m2} (ref >=60)
Globulin: 2 g/dL (calc) (ref 1.9–3.7)
Glucose, Bld: 78 mg/dL (ref 65–99)
Potassium: 4.2 mmol/L (ref 3.5–5.3)
Sodium: 139 mmol/L (ref 135–146)
Total Bilirubin: 0.4 mg/dL (ref 0.2–1.2)
Total Protein: 6.3 g/dL (ref 6.1–8.1)

## 2019-03-26 LAB — CBC WITH DIFFERENTIAL/PLATELET
Absolute Monocytes: 423 cells/uL (ref 200–950)
Basophils Absolute: 59 cells/uL (ref 0–200)
Basophils Relative: 1.3 %
Eosinophils Absolute: 131 cells/uL (ref 15–500)
Eosinophils Relative: 2.9 %
HCT: 37.2 % (ref 35.0–45.0)
Hemoglobin: 13 g/dL (ref 11.7–15.5)
Lymphs Abs: 1476 cells/uL (ref 850–3900)
MCH: 35.4 pg — ABNORMAL HIGH (ref 27.0–33.0)
MCHC: 34.9 g/dL (ref 32.0–36.0)
MCV: 101.4 fL — ABNORMAL HIGH (ref 80.0–100.0)
MPV: 9.6 fL (ref 7.5–12.5)
Monocytes Relative: 9.4 %
Neutro Abs: 2412 cells/uL (ref 1500–7800)
Neutrophils Relative %: 53.6 %
Platelets: 210 10*3/uL (ref 140–400)
RBC: 3.67 10*6/uL — ABNORMAL LOW (ref 3.80–5.10)
RDW: 13.8 % (ref 11.0–15.0)
Total Lymphocyte: 32.8 %
WBC: 4.5 10*3/uL (ref 3.8–10.8)

## 2019-03-26 NOTE — Progress Notes (Signed)
CMP WNL.  CBC is stable.

## 2019-04-06 ENCOUNTER — Ambulatory Visit: Payer: Medicare Other | Admitting: Physician Assistant

## 2019-04-15 NOTE — Progress Notes (Signed)
Office Visit Note  Patient: Melissa Perez             Date of Birth: December 22, 1945           MRN: [id number]             PCP: Lucianne Lei, MD Referring: Lucianne Lei, MD Visit Date: 04/20/2019 Occupation: @GUAROCC @  Subjective:  Medication monitoring    History of Present Illness: Melissa Perez is a 73 y.o. female with a history of seropositive rheumatoid arthritis and osteoporosis.  She is taking PLQ 200 mg 1 tablet daily, MTX 7 tablets po once weekly, and folic acid 2 mg po daily. She denies any recent RA flares.   She started having severe lower back pain in March 2020, which  was exacerbated by working in the garden. She had an injection performed by Dr. Mina Marble last week, which has slowly started to help.  She denies any other joint pain or joint swelling.  She states she went for a PLQ eye exam last week, but she was unable to complete the exam but will return to complete it once machine is operating again.  Activities of Daily Living:  Patient reports morning stiffness for 5 minutes.   Patient Denies nocturnal pain.  Difficulty dressing/grooming: Denies Difficulty climbing stairs: Denies Difficulty getting out of chair: Reports Difficulty using hands for taps, buttons, cutlery, and/or writing: Denies  Review of Systems  Constitutional: Positive for fatigue.  HENT: Negative for mouth sores, mouth dryness and nose dryness.   Eyes: Negative for pain, visual disturbance and dryness.  Respiratory: Negative for cough, hemoptysis, shortness of breath and difficulty breathing.   Cardiovascular: Negative for chest pain, palpitations, hypertension and swelling in legs/feet.  Gastrointestinal: Negative for blood in stool, constipation and diarrhea.  Endocrine: Negative for increased urination.  Genitourinary: Negative for painful urination.  Musculoskeletal: Positive for morning stiffness. Negative for arthralgias, joint pain, joint swelling, myalgias, muscle weakness, muscle tenderness and  myalgias.  Skin: Positive for rash. Negative for color change, pallor, hair loss, nodules/bumps, skin tightness, ulcers and sensitivity to sunlight.  Allergic/Immunologic: Negative for susceptible to infections.  Neurological: Negative for dizziness, numbness, headaches and weakness.  Hematological: Negative for swollen glands.  Psychiatric/Behavioral: Positive for sleep disturbance. Negative for depressed mood. The patient is not nervous/anxious.     PMFS History:  Patient Active Problem List   Diagnosis Date Noted  . DDD (degenerative disc disease), lumbar 05/27/2018  . Essential hypertension 03/19/2017  . History of pityriasis rosea 03/19/2017  . 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 History of malignant neoplasm of breast 03/19/2017  . Rheumatoid arthritis  10/15/2016  . High risk medication use 10/15/2016  . 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 S/P lumpectomy of breast 12/03/2011  . 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[place]) 11/01/2011    Past Medical History:  Diagnosis Date  . Arthritis    rheumatoid, take Humira  . Arthritis    rheumatoid  . Arthritis pain   . Back pain   . Breast cancer ([place])   . Cancer ([place])   . History of dental surgery   . Hyperlipidemia   . Hypertension   . Personal history of radiation therapy   . Rash   . Rheumatoid arthritis ([place])   . S/P lumpectomy of breast 12/03/2011    Family History  Problem Relation Age of Onset  . Breast cancer Mother   . Cancer  Mother   . Kidney cancer Father   . Cancer Father   . Breast cancer Sister   . Cancer Sister   . Lung cancer Sister   . Colon cancer Maternal Uncle   . Liver cancer Paternal Aunt    Past Surgical History:  Procedure Laterality Date  . ABDOMINAL HYSTERECTOMY    . APPENDECTOMY    . BREAST EXCISIONAL BIOPSY Left 1991   benign  . BREAST LUMPECTOMY Right 11/2011  . BREAST SURGERY     2 left breast biopsies-benign  .  left breast biopsies  [place]   Social History   Social History Narrative   Adult Son in the [employer] History  Administered Date(s) Administered  . Influenza, High Dose Seasonal PF 09/10/2017  . Zoster 11/24/2012     Objective: Vital Signs: Resp 13   Ht 5' 1.75" (1.568 m)   Wt 153 lb (69.4 kg)   BMI 28.21 kg/m    Physical Exam Vitals signs and nursing note reviewed.  Constitutional:      Appearance: She is well-developed.  HENT:     Head: Normocephalic and atraumatic.  Eyes:     Conjunctiva/sclera: Conjunctivae normal.  Neck:     Musculoskeletal: Normal range of motion.  Cardiovascular:     Rate and Rhythm: Normal rate and regular rhythm.     Heart sounds: Normal heart sounds.  Pulmonary:     Effort: Pulmonary effort is normal.     Breath sounds: Normal breath sounds.  Abdominal:     General: Bowel sounds are normal.     Palpations: Abdomen is soft.  Lymphadenopathy:     Cervical: No cervical adenopathy.  Skin:    General: Skin is warm and dry.     Capillary Refill: Capillary refill takes less than 2 seconds.  Neurological:     Mental Status: She is alert and oriented to person, place, and time.  Psychiatric:        Behavior: Behavior normal.      Musculoskeletal Exam: C-spine good ROM.  She has limited ROM with discomfort in the lumbar region.  Shoulder joints, elbow joints, wrist joints, MCPs, PIPs, and DIPs good ROM with no synovitis.  Complete fist formation bilaterally.  Hip joints, knee joints, ankle joints, MTPs, PIPs, and DIPs good ROM with no synovitis.  No warmth or effusion of knee joints.  No tenderness or swelling of ankle joints.  No tenderness of MTP joints.   CDAI Exam: CDAI Score: Not documented Patient Global Assessment: Not documented; Provider Global Assessment: Not documented Swollen: Not documented; Tender: Not documented Joint Exam   Not documented   There is currently no information documented on the homunculus. Go to  the Rheumatology activity and complete the homunculus joint exam.  Investigation: No additional findings.  Imaging: No results found.  Recent Labs: Lab Results  Component Value Date   WBC 4.5 03/25/2019   HGB 13.0 03/25/2019   PLT 210 03/25/2019   NA 139 03/25/2019   K 4.2 03/25/2019   CL 104 03/25/2019   CO2 27 03/25/2019   GLUCOSE 78 03/25/2019   BUN 10 03/25/2019   CREATININE 0.78 03/25/2019   BILITOT 0.4 03/25/2019   ALKPHOS 53 06/20/2017   AST 26 03/25/2019   ALT 21 03/25/2019   PROT 6.3 03/25/2019   ALBUMIN 3.8 06/20/2017   CALCIUM 9.5 03/25/2019   GFRAA 89 03/25/2019    Speciality Comments: No specialty comments available.  Procedures:  No procedures  performed Allergies: Dilaudid [hydromorphone hcl] and Hydrocodone     Assessment / Plan:     Visit Diagnoses: Rheumatoid arthritis  - +RF, +CCP: She has no synovitis on exam.  She has not had any recent rheumatoid arthritis flares.  She has no joint pain or joint swelling at this time.   She has morning stiffness for about 5 minutes daily.  She is clinically doing well on PLQ 200 mg 1 tablet po daily, MTX 7 tablets po once weekly, and folic acid 2 mg po daily.  She will continue on this current treatment regimen.  She does not need any refills at this time.  She was advised to notify us if she develops increased joint pain or joint swelling.  She will follow up in 5 months.   High risk medication use - PLQ 200 mg po qd, MTX 7 tabs po qwk, folic acid 2 mg po qd. eye exam 01/2018. She went last week to see her ophthalmologist, but she was unable to have her field of vision tested due to the machine not working while she was there.  She will return for the PLQ eye exam once the machine is operating.  She had lab work on 03/25/19.  She is due to update lab work in July and every 3 months.  Standing orders are in place.  DDD (degenerative disc disease), lumbar - Dr. Minerva Ends has limited ROM with discomfort.  She started  having increased lower back pain since March 2020, which was exacerbated by gardening.  She had an injection by Dr. Mina Marble last week, which has slowly started to improve her symptoms.    Chronic right SI joint pain: She has no SI joint tenderness at this time.   Age-related osteoporosis without current pathological fracture: DEXA on 06/06/18 showed T-score -2.5 and BMD 0.871. She is currently taking calcium and vitamin D supplement. She is receiving Prolia 60 mg sq injections every 6 months.  Other medical conditions are listed as follows:   Morton's neuroma, unspecified laterality  History of malignant neoplasm of breast - Stage0,s/p RTx11/12,lumpectomy   Positive PPD, treated - Tx with INH  History of hypertension  History of pityriasis rosea   Orders: No orders of the defined types were placed in this encounter.  No orders of the defined types were placed in this encounter.   Face-to-face time spent with patient was 25 minutes. Greater than 50% of time was spent in counseling and coordination of care.  Follow-Up Instructions: No follow-ups on file.   Ofilia Neas, PA-C  Note - This record has been created using Dragon software.  Chart creation errors have been sought, but may not always  have been located. Such creation errors do not reflect on  the standard of medical care.

## 2019-04-20 ENCOUNTER — Ambulatory Visit: Payer: Medicare Other | Admitting: Physician Assistant

## 2019-04-20 ENCOUNTER — Other Ambulatory Visit: Payer: Self-pay

## 2019-04-20 ENCOUNTER — Other Ambulatory Visit: Payer: Self-pay | Admitting: Rheumatology

## 2019-04-20 ENCOUNTER — Encounter: Payer: Self-pay | Admitting: Physician Assistant

## 2019-04-20 VITALS — BP 134/72 | HR 93 | Resp 13 | Ht 61.75 in | Wt 153.0 lb

## 2019-04-20 DIAGNOSIS — G8929 Other chronic pain: Secondary | ICD-10-CM

## 2019-04-20 DIAGNOSIS — Z79899 Other long term (current) drug therapy: Secondary | ICD-10-CM | POA: Diagnosis not present

## 2019-04-20 DIAGNOSIS — M0579 Rheumatoid arthritis with rheumatoid factor of multiple sites without organ or systems involvement: Secondary | ICD-10-CM | POA: Diagnosis not present

## 2019-04-20 DIAGNOSIS — G5760 Lesion of plantar nerve, unspecified lower limb: Secondary | ICD-10-CM

## 2019-04-20 DIAGNOSIS — M5136 Other intervertebral disc degeneration, lumbar region: Secondary | ICD-10-CM | POA: Diagnosis not present

## 2019-04-20 DIAGNOSIS — Z853 Personal history of malignant neoplasm of breast: Secondary | ICD-10-CM

## 2019-04-20 DIAGNOSIS — M533 Sacrococcygeal disorders, not elsewhere classified: Secondary | ICD-10-CM

## 2019-04-20 DIAGNOSIS — Z872 Personal history of diseases of the skin and subcutaneous tissue: Secondary | ICD-10-CM

## 2019-04-20 DIAGNOSIS — M810 Age-related osteoporosis without current pathological fracture: Secondary | ICD-10-CM

## 2019-04-20 DIAGNOSIS — Z8679 Personal history of other diseases of the circulatory system: Secondary | ICD-10-CM

## 2019-04-20 DIAGNOSIS — R7611 Nonspecific reaction to tuberculin skin test without active tuberculosis: Secondary | ICD-10-CM

## 2019-04-20 NOTE — Telephone Encounter (Signed)
Last Visit: 04/20/19 Next Visit: due in October 2020. Message sent to the front to schedule patient.  Labs: 03/25/19 CMP WNL. CBC is stable.   Okay to refill per Dr. Estanislado Pandy

## 2019-04-20 NOTE — Telephone Encounter (Signed)
Please schedule patient for a follow up visit. Patient due October 2020. Thanks!

## 2019-04-20 NOTE — Patient Instructions (Signed)
Standing Labs We placed an order today for your standing lab work.    Please come back and get your standing labs in July and every 3 months  We have open lab Monday through Friday from 8:30-11:30 AM and 1:30-4:00 PM  at the office of Dr. Bo Merino.   You may experience shorter wait times on Monday and Friday afternoons. The office is located at [address], [place], [place], [place] [address] No appointment is necessary.   Labs are drawn by [facility].  You may receive a bill from [facility] for your lab work.  If you wish to have your labs drawn at another location, please call the office 24 hours in advance to send orders.  If you have any questions regarding directions or hours of operation,  please call [phone].   Just as a reminder please drink plenty of water prior to coming for your lab work. Thanks!

## 2019-04-22 ENCOUNTER — Ambulatory Visit: Payer: Medicare Other | Admitting: Physician Assistant

## 2019-05-08 ENCOUNTER — Other Ambulatory Visit: Payer: Self-pay | Admitting: Podiatry

## 2019-05-08 ENCOUNTER — Ambulatory Visit (INDEPENDENT_AMBULATORY_CARE_PROVIDER_SITE_OTHER): Payer: Medicare Other

## 2019-05-08 ENCOUNTER — Other Ambulatory Visit: Payer: Self-pay

## 2019-05-08 ENCOUNTER — Ambulatory Visit: Payer: Self-pay | Admitting: Physician Assistant

## 2019-05-08 ENCOUNTER — Ambulatory Visit: Payer: Medicare Other | Admitting: Podiatry

## 2019-05-08 VITALS — Temp 97.5°F

## 2019-05-08 DIAGNOSIS — M722 Plantar fascial fibromatosis: Secondary | ICD-10-CM

## 2019-05-08 DIAGNOSIS — M79672 Pain in left foot: Secondary | ICD-10-CM

## 2019-05-08 DIAGNOSIS — M7662 Achilles tendinitis, left leg: Secondary | ICD-10-CM | POA: Diagnosis not present

## 2019-05-08 MED ORDER — DICLOFENAC SODIUM 1 % TD GEL: 2.0000 g | Freq: Three times a day (TID) | TRANSDERMAL | 2 refills | Status: DC

## 2019-05-08 MED ORDER — TRIAMCINOLONE ACETONIDE 10 MG/ML IJ SUSP
10.0000 mg | Freq: Once | INTRAMUSCULAR | Status: AC
  Administered 2019-05-08: 10 mg

## 2019-05-08 NOTE — Progress Notes (Signed)
DG  °

## 2019-05-08 NOTE — Patient Instructions (Signed)

## 2019-05-08 NOTE — Progress Notes (Signed)
Subjective:   Patient ID: Melissa Perez, female   DOB: 73 y.o.   MRN: [id number]   HPI 73 year old female presents the office today for concerns of pain to her left heel on both the bottom and the back of the heel.  She states this started about a week ago.  She does have a history of tendinitis.  She states that she notices sharp pain in the bottom of her heel as well as the back of the heel.  She states that she normally changes her shoe once a year however her shoe has been worn longer than normal.  States she did go buy a new pair of shoes.  Overall her pain has somewhat improved but still continues.  No recent injury or falls.  No swelling.  She states on the first that she had some numbness into the toes but that resolved as she started walking.  She iced it last night and this was helpful as well.   Review of Systems  All other systems reviewed and are negative.  Past Medical History:  Diagnosis Date  . Arthritis    rheumatoid, take Humira  . Arthritis    rheumatoid  . Arthritis pain   . Back pain   . Breast cancer ([place])   . Cancer ([place])   . History of dental surgery   . Hyperlipidemia   . Hypertension   . Personal history of radiation therapy   . Rash   . Rheumatoid arthritis ([place])   . S/P lumpectomy of breast 12/03/2011    Past Surgical History:  Procedure Laterality Date  . ABDOMINAL HYSTERECTOMY    . APPENDECTOMY    . BREAST EXCISIONAL BIOPSY Left 1991   benign  . BREAST LUMPECTOMY Right 11/2011  . BREAST SURGERY     2 left breast biopsies-benign  . left breast biopsies  1991 & 1990     Current Outpatient Medications:  .  Calcium 500-100 MG-UNIT CHEW, Chew by mouth., Disp: , Rfl:  .  Cholecalciferol (VITAMIN D3) 1000 UNITS CAPS, Take by mouth., Disp: , Rfl:  .  clobetasol ointment (TEMOVATE) 8.12 %, Apply 1 application topically as needed., Disp: , Rfl:  .  diclofenac sodium (VOLTAREN) 1 % GEL, Apply 2 g topically 3 (three) times daily., Disp: 200 g, Rfl: 2 .   folic acid (FOLVITE) 1 MG tablet, TAKE 2 TABLETS BY MOUTH EVERY DAY, Disp: 90 tablet, Rfl: 3 .  hydroxychloroquine (PLAQUENIL) 200 MG tablet, TAKE 1 TABLET BY MOUTH EVERY DAY, Disp: 90 tablet, Rfl: 0 .  iron polysaccharides (NIFEREX 60) 40-20 MG capsule, Take 1 capsule by mouth daily with breakfast. Taking 65 mg, Disp: , Rfl:  .  loratadine (CLARITIN) 10 MG tablet, Take 10 mg by mouth daily as needed. , Disp: , Rfl:  .  methocarbamol (ROBAXIN) 500 MG tablet, as needed., Disp: , Rfl: 0 .  methotrexate (RHEUMATREX) 2.5 MG tablet, TAKE 7 TABLETS BY MOUTH ONCE A WEEK, Disp: 84 tablet, Rfl: 0 .  triamcinolone ointment (KENALOG) 0.1 %, Apply 1 application topically as needed., Disp: , Rfl:  .  UNABLE TO FIND, 1 tablet daily. One a Day Women's 50+, Disp: , Rfl:  .  valACYclovir (VALTREX) 1000 MG tablet, Take 1,000 mg by mouth as needed. , Disp: , Rfl: 4 .  valsartan-hydrochlorothiazide (DIOVAN-HCT) 80-12.5 MG per tablet, Take 1 tablet by mouth daily. , Disp: , Rfl:   Allergies  Allergen Reactions  . Dilaudid [Hydromorphone Hcl] Nausea And Vomiting  .  Hydrocodone Nausea And Vomiting         Objective:  Physical Exam  General: AAO x3, NAD  Dermatological: Skin is warm, dry and supple bilateral.  There are no open sores, no preulcerative lesions, no rash or signs of infection present.  Vascular: Dorsalis Pedis artery and Posterior Tibial artery pedal pulses are 2/4 bilateral with immedate capillary fill time.  No varicosities and no lower extremity edema present bilateral.   Neruologic: Grossly intact via light touch bilateral.  Protective threshold with Semmes Wienstein monofilament intact to all pedal sites bilateral.  Negative Tinel sign bilaterally.  Musculoskeletal: Tenderness to palpation along the plantar medial tubercle of the calcaneus at the insertion of plantar fascia on the left foot. There is no pain along the course of the plantar fascia within the arch of the foot. Plantar fascia  appears to be intact. There is no pain with lateral compression of the calcaneus or pain with vibratory sensation. There is mild pain along the insertion of the achilles tendon.  Thompson test is negative.  No other areas of tenderness to bilateral lower extremities. MMT 5/5, ROM intact.   Gait: Unassisted, Nonantalgic.       Assessment:   Left Achilles tendinitis, plantar fasciitis     Plan:  -Treatment options discussed including all alternatives, risks, and complications -Etiology of symptoms were discussed -X-rays were obtained and reviewed with the patient.  Minimal posterior calcaneal spurs present.  No evidence of acute fracture. -Steroid injection performed to the plantar heel.  See procedure note below. -Voltaren gel ordered -Stretching, icing daily -Plantar fascial brace, night splint. -Continue with supportive shoes.  Procedure: Injection Tendon/Ligament Discussed alternatives, risks, complications and verbal consent was obtained.  Location: LEFT plantar fascia at the glabrous junction; medial approach. Skin Prep: Alcohol Injectate: 0.5cc 0.5% marcaine plain, 0.5 cc 2% lidocaine plain and, 1 cc kenalog 10. Disposition: Patient tolerated procedure well. Injection site dressed with a band-aid.  Post-injection care was discussed and return precautions discussed.   Trula Slade DPM

## 2019-05-11 ENCOUNTER — Telehealth: Payer: Self-pay | Admitting: *Deleted

## 2019-05-11 NOTE — Telephone Encounter (Signed)
Pt called and thanked the office for the call, then the message cut off.

## 2019-05-11 NOTE — Telephone Encounter (Signed)
Left message informing pt I was returning a call from this morning but her message was cut off.

## 2019-05-20 ENCOUNTER — Other Ambulatory Visit: Payer: Self-pay | Admitting: Rheumatology

## 2019-05-20 NOTE — Telephone Encounter (Signed)
Last Visit: 04/20/2019 Next Visit: 09/22/2019  Okay to refill per Dr. Estanislado Pandy.

## 2019-05-29 ENCOUNTER — Ambulatory Visit: Payer: Medicare Other | Admitting: Podiatry

## 2019-06-10 ENCOUNTER — Other Ambulatory Visit: Payer: Self-pay | Admitting: Critical Care Medicine

## 2019-06-10 DIAGNOSIS — Z20822 Contact with and (suspected) exposure to covid-19: Secondary | ICD-10-CM

## 2019-06-16 LAB — NOVEL CORONAVIRUS, NAA: SARS-CoV-2, NAA: NOT DETECTED

## 2019-06-24 ENCOUNTER — Other Ambulatory Visit: Payer: Self-pay

## 2019-06-24 DIAGNOSIS — Z79899 Other long term (current) drug therapy: Secondary | ICD-10-CM

## 2019-06-25 LAB — COMPLETE METABOLIC PANEL WITH GFR
AG Ratio: 2.1 (calc) (ref 1.0–2.5)
ALT: 20 U/L (ref 6–29)
AST: 26 U/L (ref 10–35)
Albumin: 4.4 g/dL (ref 3.6–5.1)
Alkaline phosphatase (APISO): 62 U/L (ref 37–153)
BUN: 11 mg/dL (ref 7–25)
CO2: 30 mmol/L (ref 20–32)
Calcium: 9.4 mg/dL (ref 8.6–10.4)
Chloride: 103 mmol/L (ref 98–110)
Creat: 0.81 mg/dL (ref 0.60–0.93)
GFR, Est African American: 85 mL/min/{1.73_m2} (ref >=60)
GFR, Est Non African American: 73 mL/min/{1.73_m2} (ref >=60)
Globulin: 2.1 g/dL (calc) (ref 1.9–3.7)
Glucose, Bld: 98 mg/dL (ref 65–99)
Potassium: 5.1 mmol/L (ref 3.5–5.3)
Sodium: 140 mmol/L (ref 135–146)
Total Bilirubin: 0.5 mg/dL (ref 0.2–1.2)
Total Protein: 6.5 g/dL (ref 6.1–8.1)

## 2019-06-25 LAB — CBC WITH DIFFERENTIAL/PLATELET
Absolute Monocytes: 570 cells/uL (ref 200–950)
Basophils Absolute: 63 cells/uL (ref 0–200)
Basophils Relative: 1.1 %
Eosinophils Absolute: 131 cells/uL (ref 15–500)
Eosinophils Relative: 2.3 %
HCT: 38.8 % (ref 35.0–45.0)
Hemoglobin: 13.5 g/dL (ref 11.7–15.5)
Lymphs Abs: 1733 cells/uL (ref 850–3900)
MCH: 35.7 pg — ABNORMAL HIGH (ref 27.0–33.0)
MCHC: 34.8 g/dL (ref 32.0–36.0)
MCV: 102.6 fL — ABNORMAL HIGH (ref 80.0–100.0)
MPV: 9.5 fL (ref 7.5–12.5)
Monocytes Relative: 10 %
Neutro Abs: 3203 cells/uL (ref 1500–7800)
Neutrophils Relative %: 56.2 %
Platelets: 236 10*3/uL (ref 140–400)
RBC: 3.78 10*6/uL — ABNORMAL LOW (ref 3.80–5.10)
RDW: 13.7 % (ref 11.0–15.0)
Total Lymphocyte: 30.4 %
WBC: 5.7 10*3/uL (ref 3.8–10.8)

## 2019-06-25 NOTE — Progress Notes (Signed)
RBC count is low.  MCV and MCH elevated.  Please make sure the patient is taking folic acid 2 mg po daily.  CMP WNL

## 2019-07-03 ENCOUNTER — Telehealth: Payer: Self-pay | Admitting: Rheumatology

## 2019-07-03 ENCOUNTER — Other Ambulatory Visit: Payer: Self-pay | Admitting: Rheumatology

## 2019-07-03 DIAGNOSIS — M0579 Rheumatoid arthritis with rheumatoid factor of multiple sites without organ or systems involvement: Secondary | ICD-10-CM

## 2019-07-03 NOTE — Telephone Encounter (Signed)
Patient left a voicemail stating she had her Plaquenil eye exam and requested Dr. Gertie Exon office fax over the form.  Patient requested a return call if Dr. Estanislado Pandy doesn't receive the form.

## 2019-07-03 NOTE — Telephone Encounter (Signed)
Patient advised we did received her PLQ eye exam.

## 2019-07-03 NOTE — Telephone Encounter (Addendum)
Last Visit: 04/20/2019 Next Visit: 09/22/2019 Labs: 06/24/19 RBC count is low. MCV and MCH elevated. CMP WNL PLQ Eye Exam: eye exam 01/2018  Patient advised she is due to update PLQ eye exam. Patient states she has update PLQ eye exam. Patient will contact them and have them fax results.   Okay to refill 30 day supply per Dr. Estanislado Pandy

## 2019-07-08 ENCOUNTER — Other Ambulatory Visit: Payer: Self-pay | Admitting: Rheumatology

## 2019-07-08 NOTE — Telephone Encounter (Signed)
Last Visit:04/20/2019 Next Visit:09/22/2019 Labs: 06/24/19 RBC count is low. MCV and MCH elevated. CMP WNL  Okay to refill per Dr. Estanislado Pandy

## 2019-07-26 ENCOUNTER — Other Ambulatory Visit: Payer: Self-pay | Admitting: Rheumatology

## 2019-07-26 DIAGNOSIS — M0579 Rheumatoid arthritis with rheumatoid factor of multiple sites without organ or systems involvement: Secondary | ICD-10-CM

## 2019-07-27 NOTE — Telephone Encounter (Signed)
Last Visit: 04/20/19 Next Visit: 09/22/19 Labs: 06/24/19  Eye exam: 04/17/19 WNL  Okay to refill per Dr. Estanislado Pandy

## 2019-08-06 ENCOUNTER — Encounter: Payer: Self-pay | Admitting: Physician Assistant

## 2019-08-06 LAB — HM DIABETES EYE EXAM

## 2019-08-12 ENCOUNTER — Other Ambulatory Visit (HOSPITAL_COMMUNITY): Payer: Self-pay | Admitting: Family Medicine

## 2019-08-12 DIAGNOSIS — M79606 Pain in leg, unspecified: Secondary | ICD-10-CM

## 2019-08-12 DIAGNOSIS — M7989 Other specified soft tissue disorders: Secondary | ICD-10-CM

## 2019-08-14 ENCOUNTER — Other Ambulatory Visit: Payer: Self-pay

## 2019-08-14 ENCOUNTER — Ambulatory Visit (HOSPITAL_COMMUNITY)
Admission: RE | Admit: 2019-08-14 | Discharge: 2019-08-14 | Disposition: A | Discharge status: Home / Self Care | Payer: Medicare Other | Source: Ambulatory Visit | Attending: Family Medicine | Admitting: Family Medicine

## 2019-08-14 DIAGNOSIS — M79606 Pain in leg, unspecified: Secondary | ICD-10-CM

## 2019-08-14 DIAGNOSIS — M7989 Other specified soft tissue disorders: Secondary | ICD-10-CM | POA: Diagnosis not present

## 2019-08-14 NOTE — Progress Notes (Signed)
Bilateral lower extremity venous duplex has been completed. Preliminary results can be found in CV Proc through chart review.  Results were given to [facility] at Dr. Fransico Setters office.  08/14/19 4:12 PM Melissa Perez RVT

## 2019-09-08 NOTE — Progress Notes (Deleted)
Office Visit Note  Patient: Melissa Perez             Date of Birth: 11/24/1946           MRN: [id number]             PCP: Lucianne Lei, MD Referring: Lucianne Lei, MD Visit Date: 09/22/2019 Occupation: @GUAROCC @  Subjective:  No chief complaint on file.  Methotrexate 7 tablets every 7 days, folic acid 2 mg daily, Plaquenil 200 mg 1 tablet daily.  Last Plaquenil eye exam normal on 04/17/2019.  Most recent CBC/CMP within normal limits on 06/24/2019.  Due for repeat CBC/CMP today and will monitor every 3 months.  DEXA on 06/06/18 showed T-score -2.5 and BMD 0.871. She is currently taking calcium and vitamin D supplement. She is receiving Prolia 60 mg sq injections every 6 months prescribed by Dr. Criss Rosales.   History of Present Illness: Melissa Perez is a 73 y.o. female ***   Activities of Daily Living:  Patient reports morning stiffness for *** {minute/hour:19697}.   Patient {ACTIONS;DENIES/REPORTS:21021675::"Denies"} nocturnal pain.  Difficulty dressing/grooming: {ACTIONS;DENIES/REPORTS:21021675::"Denies"} Difficulty climbing stairs: {ACTIONS;DENIES/REPORTS:21021675::"Denies"} Difficulty getting out of chair: {ACTIONS;DENIES/REPORTS:21021675::"Denies"} Difficulty using hands for taps, buttons, cutlery, and/or writing: {ACTIONS;DENIES/REPORTS:21021675::"Denies"}  No Rheumatology ROS completed.   PMFS History:  Patient Active Problem List   Diagnosis Date Noted  . Pruritus of scalp 06/20/2018  . DDD (degenerative disc disease), lumbar 05/27/2018  . Essential hypertension 03/19/2017  . History of pityriasis rosea 03/19/2017  . 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 History of malignant neoplasm of breast 03/19/2017  . Eczema 03/05/2017  . Vulvar dermatitis 03/05/2017  . Rheumatoid arthritis  10/15/2016  . High risk medication use 10/15/2016  . 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 S/P lumpectomy of breast 12/03/2011  . Primary cancer of lower  outer quadrant of right female breast ([place]) 11/01/2011    Past Medical History:  Diagnosis Date  . Arthritis    rheumatoid, take Humira  . Arthritis    rheumatoid  . Arthritis pain   . Back pain   . Breast cancer ([place])   . Cancer ([place])   . History of dental surgery   . Hyperlipidemia   . Hypertension   . Personal history of radiation therapy   . Rash   . Rheumatoid arthritis ([place])   . S/P lumpectomy of breast 12/03/2011    Family History  Problem Relation Age of Onset  . Breast cancer Mother   . Cancer Mother   . Kidney cancer Father   . Cancer Father   . Breast cancer Sister   . Cancer Sister   . Lung cancer Sister   . Colon cancer Maternal Uncle   . Liver cancer Paternal Aunt    Past Surgical History:  Procedure Laterality Date  . ABDOMINAL HYSTERECTOMY    . APPENDECTOMY    . BREAST EXCISIONAL BIOPSY Left 1991   benign  . BREAST LUMPECTOMY Right 11/2011  . BREAST SURGERY     2 left breast biopsies-benign  . left breast biopsies  [place]   Social History   Social History Narrative   Adult Son in the [employer] History  Administered Date(s) Administered  . Influenza, High Dose Seasonal PF 09/10/2017  . Zoster 11/24/2012     Objective: Vital Signs: There were no vitals taken for this visit.   Physical Exam   Musculoskeletal Exam: ***  CDAI Exam: CDAI Score: -  Patient Global: -; Provider Global: - Swollen: -; Tender: - Joint Exam   No joint exam has been documented for this visit   There is currently no information documented on the homunculus. Go to the Rheumatology activity and complete the homunculus joint exam.  Investigation: No additional findings.  Imaging: Vas [place] Lower Extremity Venous (dvt)  Result Date: 08/15/2019  Lower Venous Study Indications: Swelling, and Erythema.  Risk Factors: None identified. Comparison Study: No prior studies. Performing Technologist: Oliver Hum RVT  Examination Guidelines: A complete  evaluation includes B-mode imaging, spectral Doppler, color Doppler, and power Doppler as needed of all accessible portions of each vessel. Bilateral testing is considered an integral part of a complete examination. Limited examinations for reoccurring indications may be performed as noted.  +---------+---------------+---------+-----------+----------+--------------+ RIGHT    CompressibilityPhasicitySpontaneityPropertiesThrombus Aging +---------+---------------+---------+-----------+----------+--------------+ CFV      Full           Yes      Yes                                 +---------+---------------+---------+-----------+----------+--------------+ SFJ      Full                                                        +---------+---------------+---------+-----------+----------+--------------+ FV Prox  Full                                                        +---------+---------------+---------+-----------+----------+--------------+ FV Mid   Full                                                        +---------+---------------+---------+-----------+----------+--------------+ FV DistalFull                                                        +---------+---------------+---------+-----------+----------+--------------+ PFV      Full                                                        +---------+---------------+---------+-----------+----------+--------------+ POP      Full           Yes      Yes                                 +---------+---------------+---------+-----------+----------+--------------+ PTV      Full                                                        +---------+---------------+---------+-----------+----------+--------------+  PERO     Full                                                        +---------+---------------+---------+-----------+----------+--------------+    +---------+---------------+---------+-----------+----------+--------------+ LEFT     CompressibilityPhasicitySpontaneityPropertiesThrombus Aging +---------+---------------+---------+-----------+----------+--------------+ CFV      Full           Yes      Yes                                 +---------+---------------+---------+-----------+----------+--------------+ SFJ      Full                                                        +---------+---------------+---------+-----------+----------+--------------+ FV Prox  Full                                                        +---------+---------------+---------+-----------+----------+--------------+ FV Mid   Full                                                        +---------+---------------+---------+-----------+----------+--------------+ FV DistalFull                                                        +---------+---------------+---------+-----------+----------+--------------+ PFV      Full                                                        +---------+---------------+---------+-----------+----------+--------------+ POP      Full           Yes      Yes                                 +---------+---------------+---------+-----------+----------+--------------+ PTV      Full                                                        +---------+---------------+---------+-----------+----------+--------------+ PERO     Full                                                        +---------+---------------+---------+-----------+----------+--------------+  Summary: Right: There is no evidence of deep vein thrombosis in the lower extremity. No cystic structure found in the popliteal fossa. Left: There is no evidence of deep vein thrombosis in the lower extremity. No cystic structure found in the popliteal fossa.  *See table(s) above for measurements and observations. Electronically signed by Servando Snare MD on 08/15/2019 at 12:14:16 AM.    Final     Recent Labs: Lab Results  Component Value Date   WBC 5.7 06/24/2019   HGB 13.5 06/24/2019   PLT 236 06/24/2019   NA 140 06/24/2019   K 5.1 06/24/2019   CL 103 06/24/2019   CO2 30 06/24/2019   GLUCOSE 98 06/24/2019   BUN 11 06/24/2019   CREATININE 0.81 06/24/2019   BILITOT 0.5 06/24/2019   ALKPHOS 53 06/20/2017   AST 26 06/24/2019   ALT 20 06/24/2019   PROT 6.5 06/24/2019   ALBUMIN 3.8 06/20/2017   CALCIUM 9.4 06/24/2019   GFRAA 85 06/24/2019    Speciality Comments: PLQ Eye Exam: 04/17/19 WNL @ [facility].   Procedures:  No procedures performed Allergies: Dilaudid [hydromorphone hcl] and Hydrocodone   Assessment / Plan:     Visit Diagnoses: No diagnosis found.  Orders: No orders of the defined types were placed in this encounter.  No orders of the defined types were placed in this encounter.   Face-to-face time spent with patient was *** minutes. Greater than 50% of time was spent in counseling and coordination of care.  Follow-Up Instructions: No follow-ups on file.   Earnestine Mealing, CMA  Note - This record has been created using Editor, commissioning.  Chart creation errors have been sought, but may not always  have been located. Such creation errors do not reflect on  the standard of medical care.

## 2019-09-21 NOTE — Progress Notes (Signed)
Office Visit Note  Patient: Melissa Perez             Date of Birth: [birth year]           MRN: [id number]             PCP: Lucianne Lei, MD Referring: Lucianne Lei, MD Visit Date: 09/24/2019 Occupation: @GUAROCC @  Subjective:  Lower back pain    History of Present Illness: [name] is a 73 y.o. female with history of seropositive rheumatoid arthritis and osteoarthritis.  She is taking Methotrexate 7 tablets by mouth once weekly, folic acid 2 mg po daily, and Plaquenil 200 mg 1 tablet by mouth daily.  She denies any recent rheumatoid arthritis flares.  She is experiencing lower back pain and has been using voltaren gel topically as needed for pain relief.  She has had a epidural steroid injection performed by Dr. Mina Marble about 1 year ago.  She is not having any symptoms of radiculopathy.  She denies any other joint pain or joint swelling.  She denies any morning stiffness.    She was due for her prolia injection in August but has not heard anything from Dr. Fransico Setters office.   Activities of Daily Living:  Patient reports morning stiffness for 0 minutes.   Patient Denies nocturnal pain.  Difficulty dressing/grooming: Denies Difficulty climbing stairs: Denies Difficulty getting out of chair: Denies Difficulty using hands for taps, buttons, cutlery, and/or writing: Denies  Review of Systems  Constitutional: Negative for fatigue.  HENT: Negative for mouth sores, mouth dryness and nose dryness.   Eyes: Negative for pain, visual disturbance and dryness.  Respiratory: Negative for cough, hemoptysis, shortness of breath and difficulty breathing.   Cardiovascular: Negative for chest pain, palpitations, hypertension and swelling in legs/feet.  Gastrointestinal: Negative for blood in stool, constipation and diarrhea.  Endocrine: Negative for increased urination.  Genitourinary: Negative for painful urination.  Musculoskeletal: Positive for arthralgias and joint pain. Negative for joint swelling,  myalgias, muscle weakness, morning stiffness, muscle tenderness and myalgias.  Skin: Negative for color change, pallor, rash, hair loss, nodules/bumps, skin tightness, ulcers and sensitivity to sunlight.  Allergic/Immunologic: Negative for susceptible to infections.  Neurological: Negative for dizziness, numbness, headaches and weakness.  Hematological: Negative for swollen glands.  Psychiatric/Behavioral: Negative for depressed mood and sleep disturbance. The patient is not nervous/anxious.     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   S/P lumpectomy of breast 12/03/2011   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[place]) 11/01/2011    Past Medical History:  Diagnosis Date   Arthritis    rheumatoid, take Humira   Arthritis    rheumatoid   Arthritis pain    Back pain    Breast cancer ([facility])    Cancer ([place])    History of dental surgery    Hyperlipidemia    Hypertension    Personal history of radiation therapy    Rash    Rheumatoid arthritis ([place])    S/P lumpectomy of  breast 12/03/2011    Family History  Problem Relation Age of Onset   Breast cancer Mother    Cancer Mother    Kidney cancer Father    Cancer Father    Breast cancer Sister    Cancer Sister    Lung cancer Sister    Colon cancer Maternal Uncle    Liver cancer Paternal Aunt    Past Surgical History:  Procedure Laterality Date   ABDOMINAL HYSTERECTOMY     APPENDECTOMY      BREAST EXCISIONAL BIOPSY Left 1991   benign   BREAST LUMPECTOMY Right 11/2011   BREAST SURGERY     2 left breast biopsies-benign   left breast biopsies  1991 & 1990   Social History   Social History Narrative   Adult Son in the [employer] History  Administered Date(s) Administered   Influenza, High Dose Seasonal PF 09/10/2017   Zoster 11/24/2012     Objective: Vital Signs: BP 119/66 (BP Location: Left Arm, Patient Position: Sitting, Cuff Size: Normal)    Pulse 79    Resp 14    Ht 5' 1.75" (1.568 m)    Wt 148 lb (67.1 kg)    BMI 27.29 kg/m    Physical Exam Vitals signs and nursing note reviewed.  Constitutional:      Appearance: She is well-developed.  HENT:     Head: Normocephalic and atraumatic.  Eyes:     Conjunctiva/sclera: Conjunctivae normal.  Neck:     Musculoskeletal: Normal range of motion.  Cardiovascular:     Rate and Rhythm: Normal rate and regular rhythm.     Heart sounds: Normal heart sounds.  Pulmonary:     Effort: Pulmonary effort is normal.     Breath sounds: Normal breath sounds.  Abdominal:     General: Bowel sounds are normal.     Palpations: Abdomen is soft.  Lymphadenopathy:     Cervical: No cervical adenopathy.  Skin:    General: Skin is warm and dry.     Capillary Refill: Capillary refill takes less than 2 seconds.  Neurological:     Mental Status: She is alert and oriented to person, place, and time.  Psychiatric:        Behavior: Behavior normal.      Musculoskeletal Exam: C-spine, thoracic spine, and lumbar spine good ROM.  Midline spinal tenderness in lumbar region.  No SI joint tenderness.  Shoulder joints, elbow joints, wrist joints, MCPs, PIPs, DIPs god ROM with no synovitis.  Complete fist formation bilaterally.  Hip joints, knee joints, ankle joints, MTPs, PIPs, and DIPs good ROM with no synovitis.  No warmth or effusion of knee joints.  No tenderness or swelling of ankle joints.    CDAI Exam: CDAI Score:  -- Patient Global: --; Provider Global: -- Swollen: --; Tender: -- Joint Exam   No joint exam has been documented for this visit   There is currently no information documented on the homunculus. Go to the Rheumatology activity and complete the homunculus joint exam.  Investigation: No additional findings.  Imaging: No results found.  Recent Labs: Lab Results  Component Value Date   WBC 5.7 06/24/2019   HGB 13.5 06/24/2019   PLT 236 06/24/2019   NA 140 06/24/2019   K 5.1 06/24/2019   CL 103 06/24/2019   CO2 30 06/24/2019   GLUCOSE 98 06/24/2019   BUN 11 06/24/2019   CREATININE 0.81 06/24/2019   BILITOT 0.5 06/24/2019   ALKPHOS 53  06/20/2017   AST 26 06/24/2019   ALT 20 06/24/2019   PROT 6.5 06/24/2019   ALBUMIN 3.8 06/20/2017   CALCIUM 9.4 06/24/2019   GFRAA 85 06/24/2019    Speciality Comments: PLQ Eye Exam: 04/17/19 WNL @ [facility] [place].   Procedures:  No procedures performed Allergies: Dilaudid [hydromorphone hcl] and Hydrocodone   Assessment / Plan:     Visit Diagnoses: Rheumatoid arthritis -  +RF, +CCP: She has no synovitis on exam.  She has not had any recent rheumatoid arthritis flares.  She is clinically doing well on Methotrexate 7 tablets po once weekly, folic acid 2 mg po daily, and Plaquenil 200 mg 1 tablet by mouth daily. She has no joint pain or joint swelling at this time.  She has no morning stiffness.  She will continue on this current treatment regimen.  She does not need any refills at this time.  She was advised to notify us if she develops increased joint pain or joint swelling.  She will follow up in 5 months.   High risk medication use - Methotrexate 2.5 mg 7 tablets every 7 days, folic acid 1 mg 2 tablets daily,and Plaquenil 200 mg 1 tablet daily.  Last Plaquenil eye exam normal on 04/17/2019.  Most recent CBC/CMP within normal limits on 06/24/2019.  Due for CBC/CMP today and will monitor every 3 months.  - Plan: CBC with  Differential/Platelet, COMPLETE METABOLIC PANEL WITH GFR  DDD (degenerative disc disease), lumbar: She has chronic lower back pain.  She has no symptoms of radiculopathy at this time.  She has been applying voltaren gel topically as needed for pain relief.  She has had a epidural steroid injection in the past performed by Dr. Mina Marble.  She was given a handout of back exercises to perform.   Chronic right SI joint pain: She has no right SI joint tenderness at this time.    Age-related osteoporosis without current pathological fracture - She is on Prolia 60 mg every 6 months prescribed by Dr. Criss Rosales. Last injection on 01/28/2019. She is also on a calcium/vitamin D supplement daily.  Last DEXA on 06/06/18 showed T-score -2.5 and BMD 0.871.     Other medical conditions are listed as follows:   Morton's neuroma, unspecified laterality  History of malignant neoplasm of breast  Positive PPD, treated  History of hypertension  History of pityriasis rosea  Orders: Orders Placed This Encounter  Procedures   CBC with Differential/Platelet   COMPLETE METABOLIC PANEL WITH GFR   No orders of the defined types were placed in this encounter.    Follow-Up Instructions: Return in about 5 months (around 02/22/2020) for Rheumatoid arthritis, DDD, Osteoporosis.   Ofilia Neas, PA-C  Note - This record has been created using Dragon software.  Chart creation errors have been sought, but may not always  have been located. Such creation errors do not reflect on  the standard of medical care.

## 2019-09-22 ENCOUNTER — Ambulatory Visit: Payer: Medicare Other | Admitting: Physician Assistant

## 2019-09-24 ENCOUNTER — Encounter: Payer: Self-pay | Admitting: Physician Assistant

## 2019-09-24 ENCOUNTER — Ambulatory Visit (INDEPENDENT_AMBULATORY_CARE_PROVIDER_SITE_OTHER): Payer: Medicare Other | Admitting: Physician Assistant

## 2019-09-24 ENCOUNTER — Other Ambulatory Visit: Payer: Self-pay

## 2019-09-24 VITALS — BP 119/66 | HR 79 | Resp 14 | Ht 61.75 in | Wt 148.0 lb

## 2019-09-24 DIAGNOSIS — M810 Age-related osteoporosis without current pathological fracture: Secondary | ICD-10-CM

## 2019-09-24 DIAGNOSIS — Z853 Personal history of malignant neoplasm of breast: Secondary | ICD-10-CM

## 2019-09-24 DIAGNOSIS — Z8679 Personal history of other diseases of the circulatory system: Secondary | ICD-10-CM

## 2019-09-24 DIAGNOSIS — M51369 Other intervertebral disc degeneration, lumbar region without mention of lumbar back pain or lower extremity pain: Secondary | ICD-10-CM

## 2019-09-24 DIAGNOSIS — M533 Sacrococcygeal disorders, not elsewhere classified: Secondary | ICD-10-CM | POA: Diagnosis not present

## 2019-09-24 DIAGNOSIS — Z872 Personal history of diseases of the skin and subcutaneous tissue: Secondary | ICD-10-CM

## 2019-09-24 DIAGNOSIS — M5136 Other intervertebral disc degeneration, lumbar region: Secondary | ICD-10-CM

## 2019-09-24 DIAGNOSIS — Z79899 Other long term (current) drug therapy: Secondary | ICD-10-CM

## 2019-09-24 DIAGNOSIS — G8929 Other chronic pain: Secondary | ICD-10-CM

## 2019-09-24 DIAGNOSIS — R7611 Nonspecific reaction to tuberculin skin test without active tuberculosis: Secondary | ICD-10-CM

## 2019-09-24 DIAGNOSIS — M0579 Rheumatoid arthritis with rheumatoid factor of multiple sites without organ or systems involvement: Secondary | ICD-10-CM

## 2019-09-24 DIAGNOSIS — G5760 Lesion of plantar nerve, unspecified lower limb: Secondary | ICD-10-CM

## 2019-09-24 NOTE — Patient Instructions (Signed)

## 2019-09-25 LAB — COMPLETE METABOLIC PANEL WITH GFR
AG Ratio: 1.9 (calc) (ref 1.0–2.5)
ALT: 23 U/L (ref 6–29)
AST: 28 U/L (ref 10–35)
Albumin: 4.3 g/dL (ref 3.6–5.1)
Alkaline phosphatase (APISO): 58 U/L (ref 37–153)
BUN: 14 mg/dL (ref 7–25)
CO2: 28 mmol/L (ref 20–32)
Calcium: 9.7 mg/dL (ref 8.6–10.4)
Chloride: 103 mmol/L (ref 98–110)
Creat: 0.78 mg/dL (ref 0.60–0.93)
GFR, Est African American: 89 mL/min/{1.73_m2} (ref >=60)
GFR, Est Non African American: 76 mL/min/{1.73_m2} (ref >=60)
Globulin: 2.3 g/dL (calc) (ref 1.9–3.7)
Glucose, Bld: 90 mg/dL (ref 65–99)
Potassium: 5.1 mmol/L (ref 3.5–5.3)
Sodium: 140 mmol/L (ref 135–146)
Total Bilirubin: 0.5 mg/dL (ref 0.2–1.2)
Total Protein: 6.6 g/dL (ref 6.1–8.1)

## 2019-09-25 LAB — CBC WITH DIFFERENTIAL/PLATELET
Absolute Monocytes: 523 cells/uL (ref 200–950)
Basophils Absolute: 62 cells/uL (ref 0–200)
Basophils Relative: 1.3 %
Eosinophils Absolute: 72 cells/uL (ref 15–500)
Eosinophils Relative: 1.5 %
HCT: 40 % (ref 35.0–45.0)
Hemoglobin: 13.6 g/dL (ref 11.7–15.5)
Lymphs Abs: 1618 cells/uL (ref 850–3900)
MCH: 35.1 pg — ABNORMAL HIGH (ref 27.0–33.0)
MCHC: 34 g/dL (ref 32.0–36.0)
MCV: 103.1 fL — ABNORMAL HIGH (ref 80.0–100.0)
MPV: 10.1 fL (ref 7.5–12.5)
Monocytes Relative: 10.9 %
Neutro Abs: 2525 cells/uL (ref 1500–7800)
Neutrophils Relative %: 52.6 %
Platelets: 201 10*3/uL (ref 140–400)
RBC: 3.88 10*6/uL (ref 3.80–5.10)
RDW: 14.4 % (ref 11.0–15.0)
Total Lymphocyte: 33.7 %
WBC: 4.8 10*3/uL (ref 3.8–10.8)

## 2019-09-25 NOTE — Progress Notes (Signed)
MCV and MCH is elevated and trending up.  RBC, Hgb, and Hct are WNL.  Please forward to PCP.   CMP WNL

## 2019-09-29 ENCOUNTER — Other Ambulatory Visit: Payer: Self-pay | Admitting: Rheumatology

## 2019-09-29 DIAGNOSIS — M0579 Rheumatoid arthritis with rheumatoid factor of multiple sites without organ or systems involvement: Secondary | ICD-10-CM

## 2019-09-29 NOTE — Telephone Encounter (Signed)
Last Visit: 09/24/19 Next Visit: 02/24/20 Labs: 09/24/19 MCV and MCH is elevated and trending up. RBC, Hgb, and Hct are WNL CMP WNL  Okay to refill per Dr. Estanislado Pandy

## 2019-10-16 IMAGING — MG DIGITAL SCREENING BILATERAL MAMMOGRAM WITH TOMO AND CAD
8 series · 9 of 24 positions shown · non-contrast
Comparison: Previous exam(s).

CLINICAL DATA: Screening.

EXAM:
DIGITAL SCREENING BILATERAL MAMMOGRAM WITH TOMO AND CAD

[R MLO synth-2D]
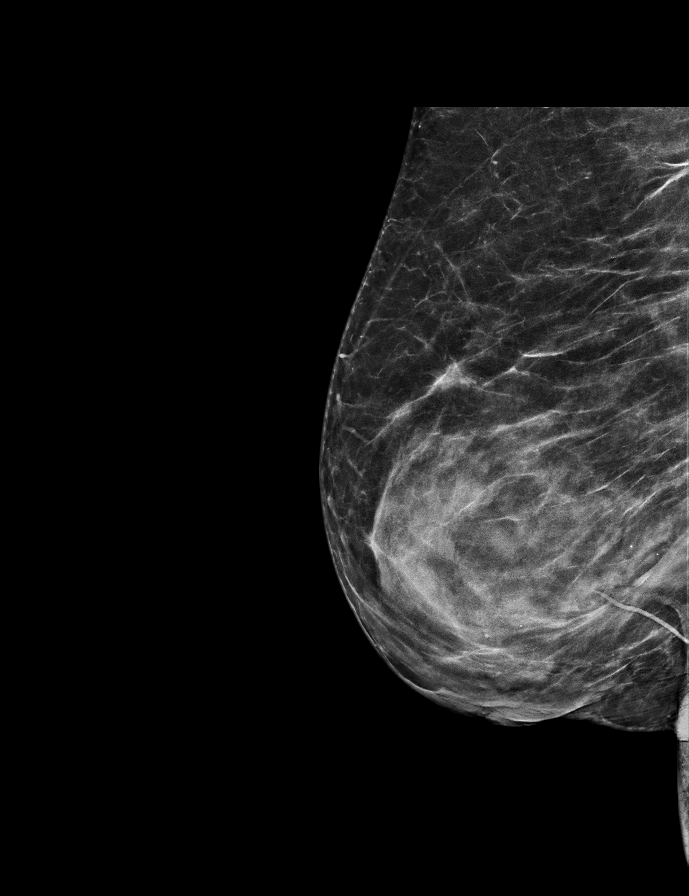

[L MLO synth-2D]
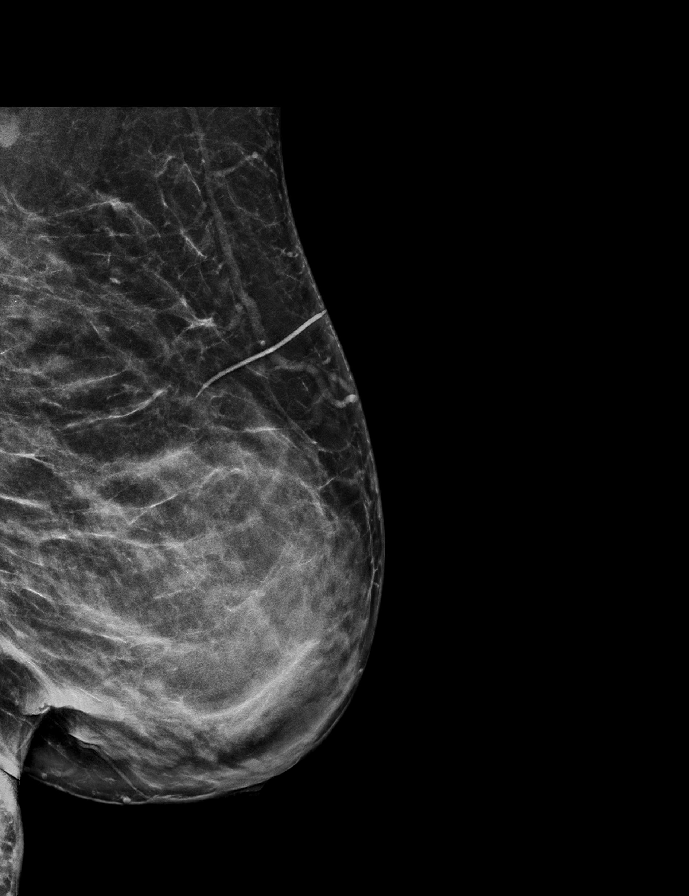

[R CC synth-2D]
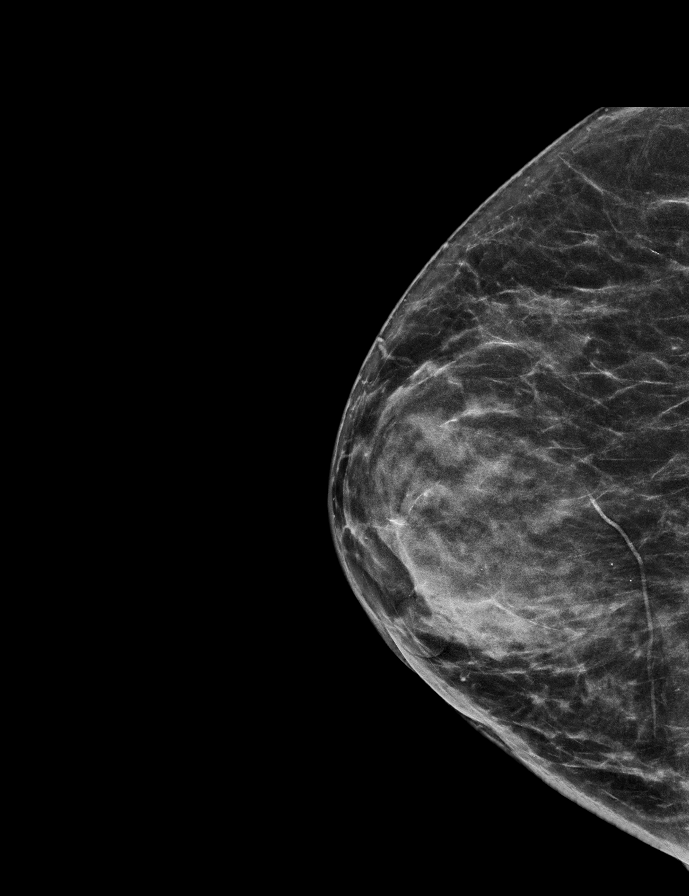

[L CC synth-2D]
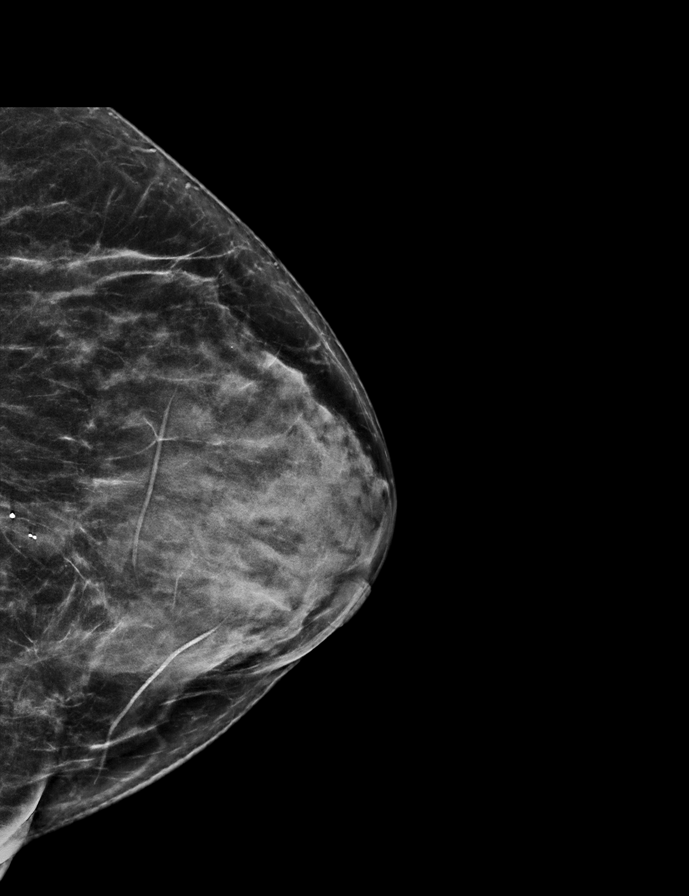

[L MLO tomo · 2 of 61 frames shown]
[frame 20/61]
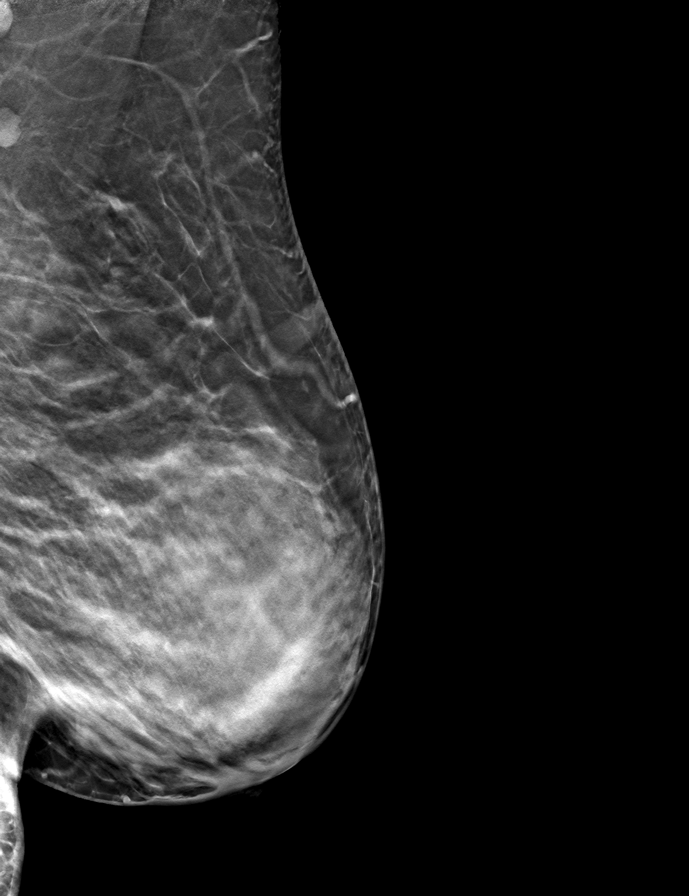
[frame 31/61]
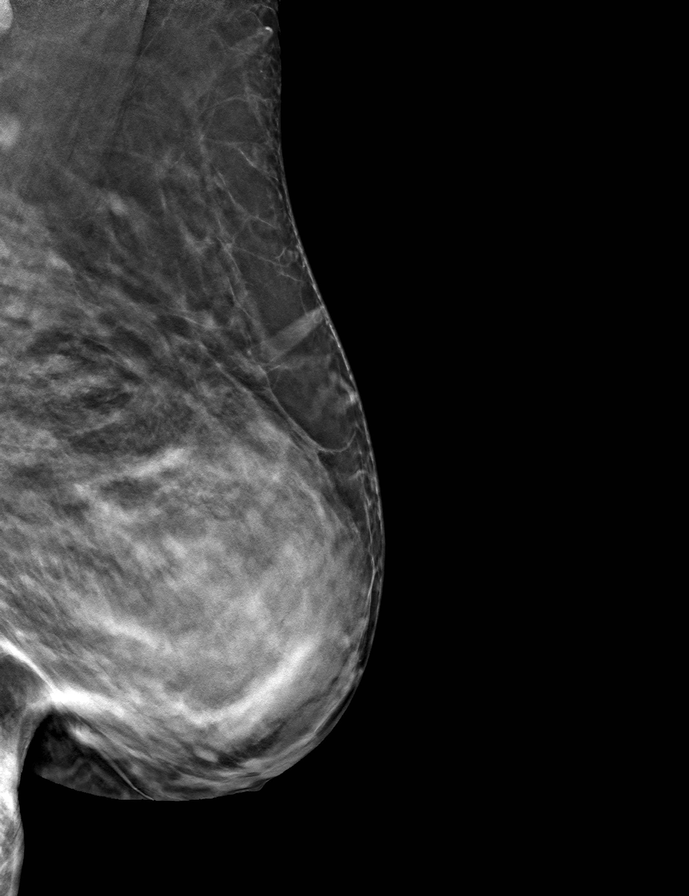

[R CC tomo · tomo slice 29/58.0]
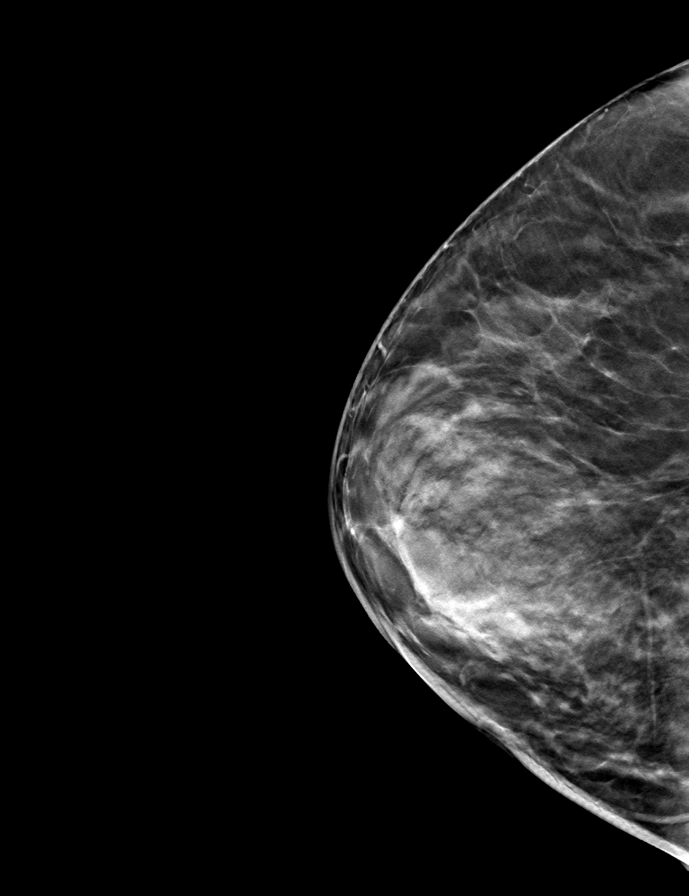

[R MLO tomo · tomo slice 32/63.0]
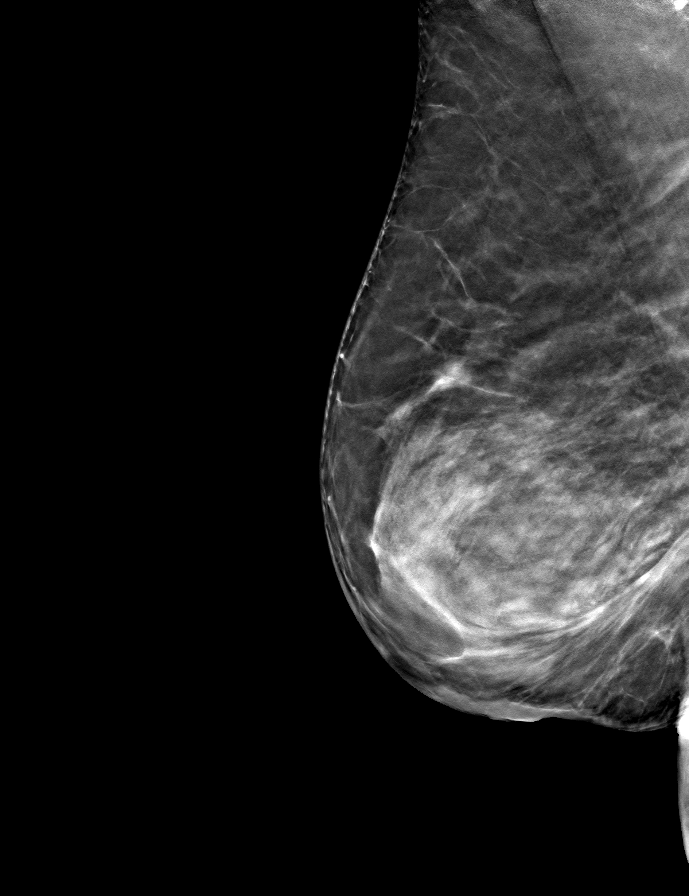

[L CC tomo · tomo slice 29/58.0]
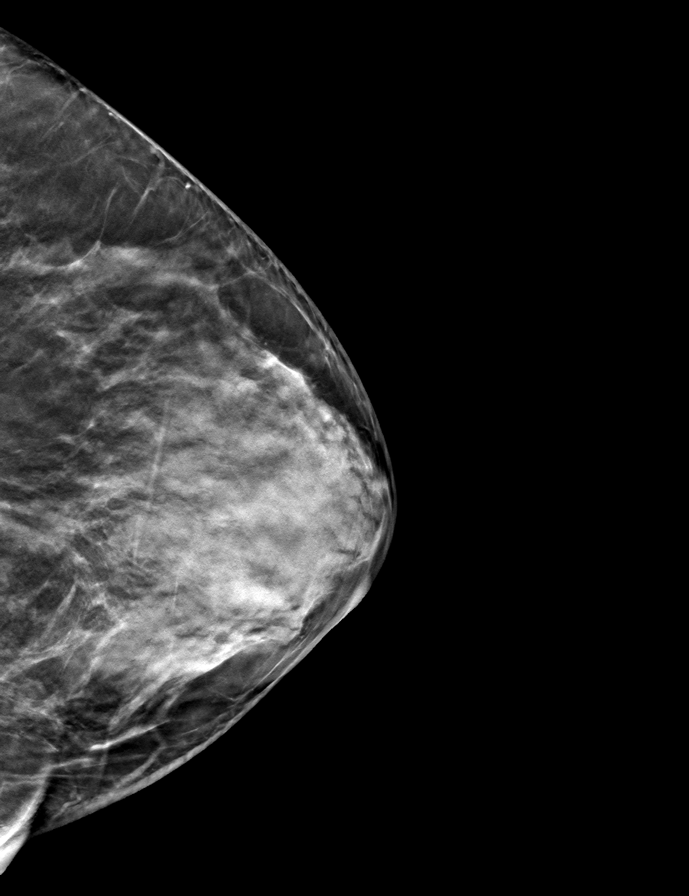

[9 of 24 positions shown; findings below may reference images not displayed]

ACR Breast Density Category c: The breast tissue is heterogeneously
dense, which may obscure small masses.
FINDINGS: There are no findings suspicious for malignancy. Images were
processed with CAD.
IMPRESSION: No mammographic evidence of malignancy. A result letter of this
screening mammogram will be mailed directly to the patient.

RECOMMENDATION:
Screening mammogram in one year. (Code:[id number])

BI-RADS CATEGORY  1: Negative.

## 2019-10-27 ENCOUNTER — Other Ambulatory Visit: Payer: Self-pay | Admitting: Rheumatology

## 2019-10-27 DIAGNOSIS — M0579 Rheumatoid arthritis with rheumatoid factor of multiple sites without organ or systems involvement: Secondary | ICD-10-CM

## 2019-10-27 NOTE — Telephone Encounter (Signed)
Last Visit: 09/24/2019 Next Visit: 02/24/2020 Labs: 09/24/2019 MCV and MCH is elevated and trending up. RBC, Hgb, and Hct are WNL. CMP WNL Eye exam: 08/06/2019  Okay to refill per Dr. Estanislado Pandy.

## 2019-11-23 ENCOUNTER — Telehealth: Payer: Self-pay | Admitting: Rheumatology

## 2019-11-23 MED ORDER — PREDNISONE 5 MG PO TABS: ORAL_TABLET | ORAL | 0 refills | Status: DC

## 2019-11-23 NOTE — Telephone Encounter (Signed)
Patient called stating on Friday, 11/20/19 her right hand started to become painful.  Patient states over the weekend she used ice, as well as voltaren gel.  Patient states she woke up this morning and her right hand is throbbing and her left hand is now painful.  Patient is requesting a return call this morning to let her know if Dr. Estanislado Pandy could give her an injection or prescribe Prednisone.

## 2019-11-23 NOTE — Telephone Encounter (Signed)
Advised patient that prescription has been sent to the pharmacy and to contact the office if symptoms persist. Patient verbalized understanding.

## 2019-11-23 NOTE — Telephone Encounter (Signed)
Patient states her right hand is painful, swollen around thumb and back side of hand and warm to the touch. Patient noticed the severity of it on Friday 11/20/2019 but says it began prior to that. Per patient, she has been taking MTX and PLQ on schedule and has not missed any doses. Patient states the left hand has become painful as well. Patient is requesting a prednisone taper. Please advise.

## 2019-11-23 NOTE — Telephone Encounter (Signed)
Okay to give prednisone taper starting at 20 mg and taper by 5 mg every 4 days.  If patient's symptoms recur after prednisone taper she should contact us.

## 2019-12-07 DIAGNOSIS — R7301 Impaired fasting glucose: Secondary | ICD-10-CM | POA: Diagnosis not present

## 2019-12-07 DIAGNOSIS — E669 Obesity, unspecified: Secondary | ICD-10-CM | POA: Diagnosis not present

## 2019-12-07 DIAGNOSIS — E782 Mixed hyperlipidemia: Secondary | ICD-10-CM | POA: Diagnosis not present

## 2019-12-07 DIAGNOSIS — I10 Essential (primary) hypertension: Secondary | ICD-10-CM | POA: Diagnosis not present

## 2019-12-11 ENCOUNTER — Other Ambulatory Visit (HOSPITAL_COMMUNITY): Payer: Self-pay | Admitting: *Deleted

## 2019-12-14 ENCOUNTER — Other Ambulatory Visit: Payer: Self-pay

## 2019-12-14 ENCOUNTER — Ambulatory Visit (HOSPITAL_COMMUNITY)
Admission: RE | Admit: 2019-12-14 | Discharge: 2019-12-14 | Disposition: A | Discharge status: Home / Self Care | Payer: Medicare PPO | Source: Ambulatory Visit | Attending: Family Medicine | Admitting: Family Medicine

## 2019-12-14 DIAGNOSIS — M818 Other osteoporosis without current pathological fracture: Secondary | ICD-10-CM | POA: Insufficient documentation

## 2019-12-14 MED ORDER — DENOSUMAB 60 MG/ML ~~LOC~~ SOSY
60.0000 mg | PREFILLED_SYRINGE | Freq: Once | SUBCUTANEOUS | Status: AC
  Administered 2019-12-14: 60 mg via SUBCUTANEOUS

## 2019-12-14 MED ORDER — DENOSUMAB 60 MG/ML ~~LOC~~ SOSY
PREFILLED_SYRINGE | SUBCUTANEOUS | Status: AC
  Filled 2019-12-14: qty 1

## 2019-12-14 NOTE — Discharge Instructions (Signed)
Denosumab injection What is this medicine? DENOSUMAB (den oh sue mab) slows bone breakdown. Prolia is used to treat osteoporosis in women after menopause and in men, and in people who are taking corticosteroids for 6 months or more. Xgeva is used to treat a high calcium level due to cancer and to prevent bone fractures and other bone problems caused by multiple myeloma or cancer bone metastases. Xgeva is also used to treat giant cell tumor of the bone. This medicine may be used for other purposes; ask your health care provider or pharmacist if you have questions. COMMON BRAND NAME(S): Prolia, XGEVA What should I tell my health care provider before I take this medicine? They need to know if you have any of these conditions:  dental disease  having surgery or tooth extraction  infection  kidney disease  low levels of calcium or Vitamin D in the blood  malnutrition  on hemodialysis  skin conditions or sensitivity  thyroid or parathyroid disease  an unusual reaction to denosumab, other medicines, foods, dyes, or preservatives  pregnant or trying to get pregnant  breast-feeding How should I use this medicine? This medicine is for injection under the skin. It is given by a health care professional in a hospital or clinic setting. A special MedGuide will be given to you before each treatment. Be sure to read this information carefully each time. For Prolia, talk to your pediatrician regarding the use of this medicine in children. Special care may be needed. For Xgeva, talk to your pediatrician regarding the use of this medicine in children. While this drug may be prescribed for children as young as 13 years for selected conditions, precautions do apply. Overdosage: If you think you have taken too much of this medicine contact a poison control center or emergency room at once. NOTE: This medicine is only for you. Do not share this medicine with others. What if I miss a dose? It is  important not to miss your dose. Call your doctor or health care professional if you are unable to keep an appointment. What may interact with this medicine? Do not take this medicine with any of the following medications:  other medicines containing denosumab This medicine may also interact with the following medications:  medicines that lower your chance of fighting infection  steroid medicines like prednisone or cortisone This list may not describe all possible interactions. Give your health care provider a list of all the medicines, herbs, non-prescription drugs, or dietary supplements you use. Also tell them if you smoke, drink alcohol, or use illegal drugs. Some items may interact with your medicine. What should I watch for while using this medicine? Visit your doctor or health care professional for regular checks on your progress. Your doctor or health care professional may order blood tests and other tests to see how you are doing. Call your doctor or health care professional for advice if you get a fever, chills or sore throat, or other symptoms of a cold or flu. Do not treat yourself. This drug may decrease your body's ability to fight infection. Try to avoid being around people who are sick. You should make sure you get enough calcium and vitamin D while you are taking this medicine, unless your doctor tells you not to. Discuss the foods you eat and the vitamins you take with your health care professional. See your dentist regularly. Brush and floss your teeth as directed. Before you have any dental work done, tell your dentist you are   receiving this medicine. Do not become pregnant while taking this medicine or for 5 months after stopping it. Talk with your doctor or health care professional about your birth control options while taking this medicine. Women should inform their doctor if they wish to become pregnant or think they might be pregnant. There is a potential for serious side  effects to an unborn child. Talk to your health care professional or pharmacist for more information. What side effects may I notice from receiving this medicine? Side effects that you should report to your doctor or health care professional as soon as possible:  allergic reactions like skin rash, itching or hives, swelling of the face, lips, or tongue  bone pain  breathing problems  dizziness  jaw pain, especially after dental work  redness, blistering, peeling of the skin  signs and symptoms of infection like fever or chills; cough; sore throat; pain or trouble passing urine  signs of low calcium like fast heartbeat, muscle cramps or muscle pain; pain, tingling, numbness in the hands or feet; seizures  unusual bleeding or bruising  unusually weak or tired Side effects that usually do not require medical attention (report to your doctor or health care professional if they continue or are bothersome):  constipation  diarrhea  headache  joint pain  loss of appetite  muscle pain  runny nose  tiredness  upset stomach This list may not describe all possible side effects. Call your doctor for medical advice about side effects. You may report side effects to FDA at 1-800-FDA-1088. Where should I keep my medicine? This medicine is only given in a clinic, doctor's office, or other health care setting and will not be stored at home. NOTE: This sheet is a summary. It may not cover all possible information. If you have questions about this medicine, talk to your doctor, pharmacist, or health care provider.  2020 Elsevier/Gold Standard (2018-03-28 16:10:44)

## 2019-12-29 ENCOUNTER — Other Ambulatory Visit: Payer: Self-pay

## 2019-12-29 DIAGNOSIS — Z79899 Other long term (current) drug therapy: Secondary | ICD-10-CM

## 2019-12-31 DIAGNOSIS — Z79899 Other long term (current) drug therapy: Secondary | ICD-10-CM | POA: Diagnosis not present

## 2019-12-31 LAB — CBC WITH DIFFERENTIAL/PLATELET
Absolute Monocytes: 459 cells/uL (ref 200–950)
Basophils Absolute: 81 cells/uL (ref 0–200)
Basophils Relative: 1.5 %
Eosinophils Absolute: 221 cells/uL (ref 15–500)
Eosinophils Relative: 4.1 %
HCT: 38.8 % (ref 35.0–45.0)
Hemoglobin: 13.4 g/dL (ref 11.7–15.5)
Lymphs Abs: 1598 cells/uL (ref 850–3900)
MCH: 35.4 pg — ABNORMAL HIGH (ref 27.0–33.0)
MCHC: 34.5 g/dL (ref 32.0–36.0)
MCV: 102.4 fL — ABNORMAL HIGH (ref 80.0–100.0)
MPV: 10.2 fL (ref 7.5–12.5)
Monocytes Relative: 8.5 %
Neutro Abs: 3040 cells/uL (ref 1500–7800)
Neutrophils Relative %: 56.3 %
Platelets: 212 10*3/uL (ref 140–400)
RBC: 3.79 10*6/uL — ABNORMAL LOW (ref 3.80–5.10)
RDW: 13.6 % (ref 11.0–15.0)
Total Lymphocyte: 29.6 %
WBC: 5.4 10*3/uL (ref 3.8–10.8)

## 2019-12-31 LAB — COMPLETE METABOLIC PANEL WITH GFR
AG Ratio: 2 (calc) (ref 1.0–2.5)
ALT: 20 U/L (ref 6–29)
AST: 28 U/L (ref 10–35)
Albumin: 4.4 g/dL (ref 3.6–5.1)
Alkaline phosphatase (APISO): 77 U/L (ref 37–153)
BUN: 12 mg/dL (ref 7–25)
CO2: 23 mmol/L (ref 20–32)
Calcium: 9.3 mg/dL (ref 8.6–10.4)
Chloride: 103 mmol/L (ref 98–110)
Creat: 0.78 mg/dL (ref 0.60–0.93)
GFR, Est African American: 89 mL/min/{1.73_m2} (ref >=60)
GFR, Est Non African American: 76 mL/min/{1.73_m2} (ref >=60)
Globulin: 2.2 g/dL (calc) (ref 1.9–3.7)
Glucose, Bld: 87 mg/dL (ref 65–99)
Potassium: 3.8 mmol/L (ref 3.5–5.3)
Sodium: 139 mmol/L (ref 135–146)
Total Bilirubin: 0.4 mg/dL (ref 0.2–1.2)
Total Protein: 6.6 g/dL (ref 6.1–8.1)

## 2020-01-14 ENCOUNTER — Ambulatory Visit: Payer: Medicare PPO | Attending: Internal Medicine

## 2020-01-14 DIAGNOSIS — Z23 Encounter for immunization: Secondary | ICD-10-CM

## 2020-01-14 NOTE — Progress Notes (Signed)
   Covid-19 Vaccination Clinic  Name:  Melissa Perez    MRN: [id number] DOB: 03/16/1946  01/14/2020  Melissa Perez was observed post Covid-19 immunization for 15 minutes without incidence. She was provided with Vaccine Information Sheet and instruction to access the V-Safe system.   Melissa Perez was instructed to call 911 with any severe reactions post vaccine: Marland Kitchen Difficulty breathing  . Swelling of your face and throat  . A fast heartbeat  . A bad rash all over your body  . Dizziness and weakness    Immunizations Administered    Name Date Dose VIS Date Route   Pfizer COVID-19 Vaccine 01/14/2020 10:53 AM 0.3 mL 11/13/2019 Intramuscular   Manufacturer: [place]   Lot: [id number]   [place]: [id number]

## 2020-01-28 ENCOUNTER — Other Ambulatory Visit: Payer: Self-pay | Admitting: Rheumatology

## 2020-01-28 DIAGNOSIS — M0579 Rheumatoid arthritis with rheumatoid factor of multiple sites without organ or systems involvement: Secondary | ICD-10-CM

## 2020-01-28 NOTE — Telephone Encounter (Signed)
Last Visit: 09/24/2019 Next Visit: 02/24/2020 Labs: 12/31/19 CMP WNL. RBC count borderline low. MCV and MCH are elevated but stable. Rest of CBC WNL. PLQ Eye Exam: 08/06/19 WNL   Okay to refill per Dr. Estanislado Pandy

## 2020-01-29 ENCOUNTER — Other Ambulatory Visit: Payer: Self-pay | Admitting: Rheumatology

## 2020-01-29 NOTE — Telephone Encounter (Signed)
Last Visit: 09/24/2019 Next Visit: 02/24/2020 Labs: 12/31/19 CMP WNL. RBC count borderline low. MCV and MCH are elevated but stable. Rest of CBC WNL.  Okay to refill per Dr. Estanislado Pandy

## 2020-02-06 ENCOUNTER — Ambulatory Visit: Payer: Medicare PPO | Attending: Internal Medicine

## 2020-02-06 DIAGNOSIS — Z23 Encounter for immunization: Secondary | ICD-10-CM | POA: Insufficient documentation

## 2020-02-06 NOTE — Progress Notes (Signed)
   Covid-19 Vaccination Clinic  Name:  Melissa Perez    MRN: [id number] DOB: 10/15/1946  02/06/2020  [name] was observed post Covid-19 immunization for 15 minutes without incident. She was provided with Vaccine Information Sheet and instruction to access the V-Safe system.   [name] was instructed to call 911 with any severe reactions post vaccine: Marland Kitchen Difficulty breathing  . Swelling of face and throat  . A fast heartbeat  . A bad rash all over body  . Dizziness and weakness   Immunizations Administered    Name Date Dose VIS Date Route   Pfizer COVID-19 Vaccine 02/06/2020 10:29 AM 0.3 mL 11/13/2019 Intramuscular   Manufacturer: [place]   Lot: [id number]   [place]: [id number]

## 2020-02-11 DIAGNOSIS — Z79899 Other long term (current) drug therapy: Secondary | ICD-10-CM | POA: Diagnosis not present

## 2020-02-11 DIAGNOSIS — H40053 Ocular hypertension, bilateral: Secondary | ICD-10-CM | POA: Diagnosis not present

## 2020-02-11 DIAGNOSIS — H43813 Vitreous degeneration, bilateral: Secondary | ICD-10-CM | POA: Diagnosis not present

## 2020-02-11 DIAGNOSIS — H2513 Age-related nuclear cataract, bilateral: Secondary | ICD-10-CM | POA: Diagnosis not present

## 2020-02-15 ENCOUNTER — Other Ambulatory Visit: Payer: Self-pay | Admitting: Rheumatology

## 2020-02-15 ENCOUNTER — Other Ambulatory Visit: Payer: Self-pay | Admitting: Family Medicine

## 2020-02-15 DIAGNOSIS — W1840XD Slipping, tripping and stumbling without falling, unspecified, subsequent encounter: Secondary | ICD-10-CM | POA: Diagnosis not present

## 2020-02-15 DIAGNOSIS — R7309 Other abnormal glucose: Secondary | ICD-10-CM | POA: Diagnosis not present

## 2020-02-15 DIAGNOSIS — I10 Essential (primary) hypertension: Secondary | ICD-10-CM | POA: Diagnosis not present

## 2020-02-15 DIAGNOSIS — E669 Obesity, unspecified: Secondary | ICD-10-CM | POA: Diagnosis not present

## 2020-02-15 DIAGNOSIS — R2689 Other abnormalities of gait and mobility: Secondary | ICD-10-CM | POA: Diagnosis not present

## 2020-02-15 DIAGNOSIS — Z1231 Encounter for screening mammogram for malignant neoplasm of breast: Secondary | ICD-10-CM

## 2020-02-15 DIAGNOSIS — E782 Mixed hyperlipidemia: Secondary | ICD-10-CM | POA: Diagnosis not present

## 2020-02-15 DIAGNOSIS — M0579 Rheumatoid arthritis with rheumatoid factor of multiple sites without organ or systems involvement: Secondary | ICD-10-CM | POA: Diagnosis not present

## 2020-02-15 DIAGNOSIS — R2681 Unsteadiness on feet: Secondary | ICD-10-CM | POA: Diagnosis not present

## 2020-02-15 NOTE — Progress Notes (Signed)
Office Visit Note  Patient: Melissa Perez             Date of Birth: [birth year]           MRN: [id number]             PCP: Lucianne Lei, MD Referring: Lucianne Lei, MD Visit Date: 02/24/2020 Occupation: @GUAROCC @  Subjective:  Lower back pain   History of Present Illness: Melissa Perez is a 74 y.o. female with history of seropositive rheumatoid arthritis, DDD, and osteoporosis.  Patient is taking methotrexate 7 tablets by mouth once weekly, folic acid 2 mg by mouth daily, and Plaquenil 200 mg 1 tablet daily.  She denies missing any doses recently.  She has received both COVID-19 vaccinations.  She denies any recent rheumatoid arthritis flares.  She states that she occasionally will experience pain in both wrist joints and both shoulder joints due to overuse activities which typically resolves within 1 day.  She denies any joint swelling at this time.  She has been experiencing increased lower back pain for the past 1 week.  She has had injections in the past performed by Dr. Mina Marble.  She takes Robaxin and Tylenol as needed for severe pain.  She also uses Voltaren gel topically as needed.     Activities of Daily Living:  Patient reports morning stiffness for 0 none.   Patient Denies nocturnal pain.  Difficulty dressing/grooming: Denies Difficulty climbing stairs: Denies Difficulty getting out of chair: Reports Difficulty using hands for taps, buttons, cutlery, and/or writing: Denies  Review of Systems  Constitutional: Positive for fatigue.  HENT: Negative for mouth sores, mouth dryness and nose dryness.   Eyes: Negative for pain, visual disturbance and dryness.  Respiratory: Negative for cough, hemoptysis, shortness of breath and difficulty breathing.   Cardiovascular: Negative for chest pain, palpitations, hypertension and swelling in legs/feet.  Gastrointestinal: Negative for blood in stool, constipation and diarrhea.  Endocrine: Negative for cold intolerance and increased urination.   Genitourinary: Negative for difficulty urinating and painful urination.  Musculoskeletal: Positive for arthralgias and joint pain. Negative for joint swelling, myalgias, muscle weakness, morning stiffness, muscle tenderness and myalgias.  Skin: Positive for rash. Negative for color change, pallor, hair loss, nodules/bumps, skin tightness, ulcers and sensitivity to sunlight.  Allergic/Immunologic: Negative for susceptible to infections.  Neurological: Negative for dizziness, numbness, headaches and weakness.  Hematological: Negative for swollen glands.  Psychiatric/Behavioral: Positive for sleep disturbance. Negative for depressed mood. The patient is not nervous/anxious.     PMFS History:  Patient Active Problem List   Diagnosis Date Noted  . Pruritus of scalp 06/20/2018  . DDD (degenerative disc disease), lumbar 05/27/2018  . Essential hypertension 03/19/2017  . History of pityriasis rosea 03/19/2017  . 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 History of malignant neoplasm of breast 03/19/2017  . Eczema 03/05/2017  . Vulvar dermatitis 03/05/2017  . Rheumatoid arthritis  10/15/2016  . High risk medication use 10/15/2016  . 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 S/P lumpectomy of breast 12/03/2011  . 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[place]) 11/01/2011    Past Medical History:  Diagnosis Date  . Arthritis    rheumatoid, take Humira  . Arthritis    rheumatoid  . Arthritis pain   . Back pain   . Breast cancer ([place])   . Cancer ([place])   . History of dental surgery   . Hyperlipidemia   .  Hypertension   . Personal history of radiation therapy   . Rash   . Rheumatoid arthritis ([place])   . S/P lumpectomy of breast 12/03/2011    Family History  Problem Relation Age of Onset  . Breast cancer Mother   . Cancer Mother   . Kidney cancer Father   . Cancer Father   . Breast cancer Sister   . Cancer Sister   . Lung cancer  Sister   . Colon cancer Maternal Uncle   . Liver cancer Paternal Aunt    Past Surgical History:  Procedure Laterality Date  . ABDOMINAL HYSTERECTOMY    . APPENDECTOMY    . BREAST EXCISIONAL BIOPSY Left 1991   benign  . BREAST LUMPECTOMY Right 11/2011  . BREAST SURGERY     2 left breast biopsies-benign  . left breast biopsies  [place]   Social History   Social History Narrative   Adult Son in the [employer] History  Administered Date(s) Administered  . Influenza, High Dose Seasonal PF 09/10/2017  . Influenza-Unspecified 07/03/2018  . PFIZER SARS-COV-2 Vaccination 01/14/2020, 02/06/2020  . Zoster 11/24/2012     Objective: Vital Signs: BP 125/68 (BP Location: Right Arm, Patient Position: Sitting, Cuff Size: Normal)   Pulse 84   Resp 16   Ht 5' 1.75" (1.568 m)   Wt 154 lb (69.9 kg)   BMI 28.40 kg/m    Physical Exam Vitals and nursing note reviewed.  Constitutional:      Appearance: She is well-developed.  HENT:     Head: Normocephalic and atraumatic.  Eyes:     Conjunctiva/sclera: Conjunctivae normal.  Pulmonary:     Effort: Pulmonary effort is normal.  Abdominal:     General: Bowel sounds are normal.     Palpations: Abdomen is soft.  Musculoskeletal:     Cervical back: Normal range of motion.  Lymphadenopathy:     Cervical: No cervical adenopathy.  Skin:    General: Skin is warm and dry.     Capillary Refill: Capillary refill takes less than 2 seconds.  Neurological:     Mental Status: She is alert and oriented to person, place, and time.  Psychiatric:        Behavior: Behavior normal.      Musculoskeletal Exam: C-spine has good range of motion with no discomfort.  She has good range of motion lumbar spine with some discomfort.  Midline spinal tenderness in the lumbar region.  Tenderness over the right SI joint.  Shoulder has had elbow joints, wrist joints, MCPs, PIPs and DIPs good range of motion with no synovitis.  She has complete fist  formation bilaterally.  Hip joints, knee joints, ankle joints. and MTPs, PIPs and DIPs good range of motion with no synovitis.  No warmth or effusion of bilateral knee joints.  She has bilateral knee crepitus.  No tenderness or inflammation of ankle joints.  Tenderness over the right trochanteric bursa noted.  CDAI Exam: CDAI Score: 0.4  Patient Global: 2 mm; Provider Global: 2 mm Swollen: 0 ; Tender: 0  Joint Exam 02/24/2020   No joint exam has been documented for this visit   There is currently no information documented on the homunculus. Go to the Rheumatology activity and complete the homunculus joint exam.  Investigation: No additional findings.  Imaging: No results found.  Recent Labs: Lab Results  Component Value Date   WBC 5.4 12/31/2019   HGB 13.4 12/31/2019   PLT 212 12/31/2019  NA 139 12/31/2019   K 3.8 12/31/2019   CL 103 12/31/2019   CO2 23 12/31/2019   GLUCOSE 87 12/31/2019   BUN 12 12/31/2019   CREATININE 0.78 12/31/2019   BILITOT 0.4 12/31/2019   ALKPHOS 53 06/20/2017   AST 28 12/31/2019   ALT 20 12/31/2019   PROT 6.6 12/31/2019   ALBUMIN 3.8 06/20/2017   CALCIUM 9.3 12/31/2019   GFRAA 89 12/31/2019    Speciality Comments: PLQ Eye Exam:02/11/2020 @ [facility] [place]. Follow up in 6 months.  Procedures:  No procedures performed Allergies: Dilaudid [hydromorphone hcl] and Hydrocodone   Assessment / Plan:     Visit Diagnoses: Rheumatoid arthritis - +RF, +CCP: She has no synovitis on exam.  She has not had any recent rheumatoid arthritis flares.  She is clinically doing well on methotrexate 7 tablets by mouth once weekly, folic acid 2 mg by mouth daily, Plaquenil 200 mg 1 tablet daily.  She has not missed any doses of these medications recently.  She has occasional discomfort in both wrist joints and both shoulder joints due to overuse activities which typically resolves within 1 day.  She has not had any joint swelling recently.  She is not  having any morning stiffness.  She will continue taking methotrexate and Plaquenil as prescribed.  A refill of Voltaren gel will be sent to the pharmacy.  She was advised to notify us if she develops increased joint pain or joint swelling.  She will follow-up in the office in 5 months.  High risk medication use - Methotrexate 2.5 mg 7 tablets every 7 days, folic acid 1 mg 2 tablets daily,and Plaquenil 200 mg 1 tablet daily.  PLQ Eye Exam:02/11/2020 @ [facility] [place]. Follow up in 6 months.  CBC and CMP were stable on 12/31/2019.  She will return for lab work in April and every 3 months to monitor for drug toxicity.  She has not had any recent infections.  She has received both COVID-19 vaccinations.  DDD (degenerative disc disease), lumbar: She has been experiencing acute on chronic lower back pain for the past 1 week.  She has midline spinal tenderness in the lumbar region as well as over the right SI joint.  She takes Robaxin and Tylenol very sparingly for pain relief.  She has had injections by Dr. Ronalee Red in the past which were effective.  She is planning on going to physical therapy.  She has been performing home exercises which have been helpful.  Chronic right SI joint pain: She has tenderness over the right SI joint.  Age-related osteoporosis without current pathological fracture - Last DEXA on 06/06/18 showed T-score -2.5 and BMD 0.871.  She will be due to update bone density in July 2021.  She receives Prolia 60 mg every 6 months prescribed by Dr. Criss Rosales.  Other medical conditions are listed as follows:  Morton's neuroma, unspecified laterality  Positive PPD, treated  History of malignant neoplasm of breast  History of pityriasis rosea  History of hypertension  Orders: No orders of the defined types were placed in this encounter.  No orders of the defined types were placed in this encounter.     Follow-Up Instructions: Return in about 5 months (around 07/26/2020) for  Rheumatoid arthritis, DDD.   Ofilia Neas, PA-C  Note - This record has been created using Dragon software.  Chart creation errors have been sought, but may not always  have been located. Such creation errors do not reflect on  the standard of medical care. 

## 2020-02-15 NOTE — Telephone Encounter (Signed)
Last Visit:09/24/2019 Next Visit:02/24/2020  Okay to refill per Dr. Estanislado Pandy

## 2020-02-24 ENCOUNTER — Ambulatory Visit: Payer: Medicare Other | Admitting: Physician Assistant

## 2020-02-24 ENCOUNTER — Other Ambulatory Visit: Payer: Self-pay

## 2020-02-24 ENCOUNTER — Encounter: Payer: Self-pay | Admitting: Physician Assistant

## 2020-02-24 VITALS — BP 125/68 | HR 84 | Resp 16 | Ht 61.75 in | Wt 154.0 lb

## 2020-02-24 DIAGNOSIS — G5760 Lesion of plantar nerve, unspecified lower limb: Secondary | ICD-10-CM | POA: Diagnosis not present

## 2020-02-24 DIAGNOSIS — G8929 Other chronic pain: Secondary | ICD-10-CM

## 2020-02-24 DIAGNOSIS — M533 Sacrococcygeal disorders, not elsewhere classified: Secondary | ICD-10-CM

## 2020-02-24 DIAGNOSIS — Z79899 Other long term (current) drug therapy: Secondary | ICD-10-CM

## 2020-02-24 DIAGNOSIS — R7611 Nonspecific reaction to tuberculin skin test without active tuberculosis: Secondary | ICD-10-CM | POA: Diagnosis not present

## 2020-02-24 DIAGNOSIS — M810 Age-related osteoporosis without current pathological fracture: Secondary | ICD-10-CM

## 2020-02-24 DIAGNOSIS — Z8679 Personal history of other diseases of the circulatory system: Secondary | ICD-10-CM

## 2020-02-24 DIAGNOSIS — M0579 Rheumatoid arthritis with rheumatoid factor of multiple sites without organ or systems involvement: Secondary | ICD-10-CM | POA: Diagnosis not present

## 2020-02-24 DIAGNOSIS — M5136 Other intervertebral disc degeneration, lumbar region: Secondary | ICD-10-CM | POA: Diagnosis not present

## 2020-02-24 DIAGNOSIS — Z872 Personal history of diseases of the skin and subcutaneous tissue: Secondary | ICD-10-CM

## 2020-02-24 DIAGNOSIS — Z853 Personal history of malignant neoplasm of breast: Secondary | ICD-10-CM

## 2020-02-24 MED ORDER — DICLOFENAC SODIUM 1 % EX GEL: CUTANEOUS | 2 refills | Status: DC

## 2020-02-24 NOTE — Addendum Note (Signed)
Addended by: Earnestine Mealing on: 02/24/2020 11:42 AM   Modules accepted: Orders

## 2020-02-24 NOTE — Patient Instructions (Signed)
Standing Labs We placed an order today for your standing lab work.    Please come back and get your standing labs in April and every 3 months   We have open lab daily Monday through Thursday from 8:30-12:30 PM and 1:30-4:30 PM and Friday from 8:30-12:30 PM and 1:30-4:00 PM at the office of Dr. Shaili Deveshwar.   You may experience shorter wait times on Monday and Friday afternoons. The office is located at [address], [address], [place], [place] [address] No appointment is necessary.   Labs are drawn by [facility].  You may receive a bill from [facility] for your lab work.  If you wish to have your labs drawn at another location, please call the office 24 hours in advance to send orders.  If you have any questions regarding directions or hours of operation,  please call [phone].   Just as a reminder please drink plenty of water prior to coming for your lab work. Thanks!   

## 2020-03-15 DIAGNOSIS — M47816 Spondylosis without myelopathy or radiculopathy, lumbar region: Secondary | ICD-10-CM | POA: Diagnosis not present

## 2020-03-15 DIAGNOSIS — M533 Sacrococcygeal disorders, not elsewhere classified: Secondary | ICD-10-CM | POA: Diagnosis not present

## 2020-03-17 ENCOUNTER — Other Ambulatory Visit: Payer: Self-pay

## 2020-03-17 ENCOUNTER — Ambulatory Visit: Payer: Medicare PPO | Attending: Family Medicine | Admitting: Physical Therapy

## 2020-03-17 DIAGNOSIS — M6281 Muscle weakness (generalized): Secondary | ICD-10-CM | POA: Diagnosis not present

## 2020-03-17 DIAGNOSIS — R262 Difficulty in walking, not elsewhere classified: Secondary | ICD-10-CM | POA: Diagnosis not present

## 2020-03-17 DIAGNOSIS — R2681 Unsteadiness on feet: Secondary | ICD-10-CM | POA: Insufficient documentation

## 2020-03-17 DIAGNOSIS — M25551 Pain in right hip: Secondary | ICD-10-CM | POA: Insufficient documentation

## 2020-03-17 NOTE — Therapy (Signed)
[facility] [address] [place], [place], [address] Phone: [phone]   Fax:  [phone]  Physical Therapy Evaluation  Patient Details  Name: Melissa Perez MRN: [id number] Date of Birth: Jan 29, 1946 Referring Provider (PT): Elyn Peers, MD   Encounter Date: 03/17/2020  PT End of Session - 03/17/20 0855    Visit Number  1    Number of Visits  17    Date for PT Re-Evaluation  05/16/20    Authorization Type  [employer]; $20 copay; 10th visit PN    Progress Note Due on Visit  10    PT Start Time  0803    PT Stop Time  0846    PT Time Calculation (min)  43 min    Activity Tolerance  Patient tolerated treatment well    Behavior During Therapy  [facility] for tasks assessed/performed       Past Medical History:  Diagnosis Date  . Arthritis    rheumatoid, take Humira  . Arthritis    rheumatoid  . Arthritis pain   . Back pain   . Breast cancer ([place])   . Cancer ([place])   . History of dental surgery   . Hyperlipidemia   . Hypertension   . Personal history of radiation therapy   . Rash   . Rheumatoid arthritis ([place])   . S/P lumpectomy of breast 12/03/2011    Past Surgical History:  Procedure Laterality Date  . ABDOMINAL HYSTERECTOMY    . APPENDECTOMY    . BREAST EXCISIONAL BIOPSY Left 1991   benign  . BREAST LUMPECTOMY Right 11/2011  . BREAST SURGERY     2 left breast biopsies-benign  . left breast biopsies  1991 & 1990    There were no vitals filed for this visit.   Subjective Assessment - 03/17/20 0804    Subjective  "I stagger."  Has noticed it for several months.  PCP had pt attempt tandem gait and pt lost her balance with the wall.  Reports at home she will lose her balance suddenly - sitting, standing, walking.  Has been walking in the morning and has had to use a golf club as a "crutch".  Denies dizziness of any type.  Denies changes to vision or hearing.  Denies changes in sensation.    Pertinent  History  RA, DDD, osteoporosis, low back pain, HTN, SI joint pain, R malignant neoplasm of breast with radiation, metatarsalgiaUse LUE for blood pressure    Limitations  Standing;Walking    How long can you walk comfortably?  40-50 minutes or 2 miles, 5 per week    Patient Stated Goals  To find out what is causing the staggering, doesn't want to fall    Currently in Pain?  Yes    Pain Score  5     Pain Location  Hand    Pain Orientation  Right;Left    Pain Descriptors / Indicators  Aching    Pain Type  Chronic pain         [facility] PT Assessment - 03/17/20 0812      Assessment   Medical Diagnosis  Gait abnormality    Referring Provider (PT)  Elyn Peers, MD    Onset Date/Surgical Date  02/17/20    Prior Therapy  yes for RA      Precautions   Precautions  Other (comment)    Precaution Comments  RA, DDD, osteoporosis, low back pain, HTN, SI joint pain, R  malignant neoplasm of breast with radiation, metatarsalgia      Balance Screen   Has the patient fallen in the past 6 months  No      [place] residence    Living Arrangements  Alone    Type of [place] to enter    Entrance Stairs-Number of Steps  3    Entrance Stairs-Rails  None   is having rails installed and deck replaced   Home Layout  One level    [place]  None      Prior Function   Level of [place]  yard work, gardening, walking       Observation/Other Assessments   Focus on Therapeutic Outcomes (FOTO)   Not applicable, not true vertigo      Sensation   Light Touch  Appears Intact      Coordination   Gross Motor Movements are Fluid and Coordinated  Yes    Finger Nose Finger Test  [facility]    Heel Shin Test  WFL      ROM / Strength   AROM / PROM / Strength  Strength      Strength   Overall Strength  Deficits    Overall Strength Comments  bilat LE: 3+/5 hip flexion, 4/5 knee extension and flexion, 4-/5 ankle DF       Palpation   Spinal mobility  WFL flexion, extension, lateral flexion and rotation but reports pain with each movement    SI assessment   tender to palpation over sacrum and SI joint, tender over bilat piriformis; limited R hip IR      Ambulation/Gait   Ambulation/Gait  Yes    Ambulation/Gait Assistance  7: Independent    Ambulation Distance (Feet)  115 Feet    Assistive device  None    Gait Pattern  Step-through pattern;Antalgic    Ambulation Surface  Level;Indoor    Stairs  Yes    Stairs Assistance  6: Modified independent (Device/Increase time)    Stair Management Technique  Two rails;Alternating pattern;Forwards    Number of Stairs  4    Height of Stairs  6      Functional Gait  Assessment   Gait assessed   Yes    Gait Level Surface  Walks 20 ft in less than 7 sec but greater than 5.5 sec, uses assistive device, slower speed, mild gait deviations, or deviates 6-10 in outside of the 12 in walkway width.    Change in Gait Speed  Able to smoothly change walking speed without loss of balance or gait deviation. Deviate no more than 6 in outside of the 12 in walkway width.    Gait with Horizontal Head Turns  Performs head turns smoothly with slight change in gait velocity (eg, minor disruption to smooth gait path), deviates 6-10 in outside 12 in walkway width, or uses an assistive device.    Gait with Vertical Head Turns  Performs head turns with no change in gait. Deviates no more than 6 in outside 12 in walkway width.    Gait and Pivot Turn  Turns slowly, requires verbal cueing, or requires several small steps to catch balance following turn and stop    Step Over Obstacle  Is able to step over 2 stacked shoe boxes taped together (9 in total height) without changing gait speed. No evidence of imbalance.  Gait with Narrow Base of Support  Ambulates 4-7 steps.    Gait with Eyes Closed  Walks 20 ft, uses assistive device, slower speed, mild gait deviations, deviates 6-10 in outside 12 in  walkway width. Ambulates 20 ft in less than 9 sec but greater than 7 sec.    Ambulating Backwards  Walks 20 ft, uses assistive device, slower speed, mild gait deviations, deviates 6-10 in outside 12 in walkway width.    Steps  Alternating feet, must use rail.    Total Score  21    FGA comment:  21/30                Objective measurements completed on examination: See above findings.              PT Education - 03/17/20 0900    Education Details  clinical findings, PT POC and goals, aquatic therapy    Person(s) Educated  Patient    Methods  Explanation    Comprehension  Verbalized understanding       PT Short Term Goals - 03/17/20 0907      PT SHORT TERM GOAL #1   Title  Pt will initiate and demonstrate independence with HEP    Time  4    Period  Weeks    Status  New    Target Date  04/16/20      PT SHORT TERM GOAL #2   Title  Pt will initiate aquatic therapy    Time  4    Period  Weeks    Status  New    Target Date  04/16/20      PT SHORT TERM GOAL #3   Title  Pt will demonstrate 2 point improvement in FGA to indicate decreased falls risk    Baseline  21/30    Time  4    Period  Weeks    Status  New    Target Date  04/16/20      PT SHORT TERM GOAL #4   Title  Pt will report 25% reduction in lumbar, SI and R hip pain    Time  4    Period  Weeks    Status  New    Target Date  04/16/20        PT Long Term Goals - 03/17/20 0909      PT LONG TERM GOAL #1   Title  Pt will demonstrate independence with final land and aquatic HEP    Time  8    Period  Weeks    Status  New    Target Date  05/16/20      PT LONG TERM GOAL #2   Title  Pt will demonstrate decreased falls risk as indicated by increase in FGA to >/= 25/30    Time  8    Period  Weeks    Status  New    Target Date  05/16/20      PT LONG TERM GOAL #3   Title  Pt will report 50% reduction in low back, R hip and SI joint pain    Time  8    Period  Weeks    Status  New    Target  Date  05/16/20      PT LONG TERM GOAL #4   Title  Pt will report being able to perform her daily walks without using golf club as AD for stability    Time  8  Period  Weeks    Status  New    Target Date  05/16/20      PT LONG TERM GOAL #5   Title  Pt will demonstrate ability to safely use gardening bench to decrease strain on low back and pelvis    Time  8    Period  Weeks    Status  New    Target Date  05/16/20             Plan - 03/17/20 0902    Clinical Impression Statement  Pt is a 74 year old female referred to Neuro OPPT for evaluation of gait abnormalities and impaired balance.  Pt's PMH is significant for the following: RA, DDD, osteoporosis, low back pain, HTN, SI joint pain, R malignant neoplasm of breast with radiation, metatarsalgia. The following deficits were noted during pt's exam: pain in R hip and SI joint, decreased R hip ROM, lumbar pain with movement in all planes, impaired LE strength, impaired balance and difficulty walking.  Pt's FGA score indicates pt is at medium risk for falls.  Pt lives alone and performs gardening and a lot of yard work. Pt would benefit from skilled PT to address these impairments and functional limitations to maximize functional mobility independence and reduce falls risk.    Personal Factors and Comorbidities  Comorbidity 3+    Comorbidities  RA, DDD, osteoporosis, low back pain, HTN, SI joint pain, R malignant neoplasm of breast with radiation, metatarsalgiaUse LUE for blood pressure    Examination-Activity Limitations  Bend;Locomotion Level;Squat;Stairs;Stand    Examination-Participation Restrictions  Community Activity;Yard Work    Stability/Clinical Decision Making  Stable/Uncomplicated    Designer, jewellery  Low    Rehab Potential  Good    PT Frequency  2x / week    PT Duration  8 weeks    PT Treatment/Interventions  ADLs/Self Care Home Management;Aquatic Therapy;Cryotherapy;Moist Heat;Gait training;Stair  training;Functional mobility training;Therapeutic activities;Therapeutic exercise;Balance training;Neuromuscular re-education;Patient/family education;Manual techniques;Passive range of motion;Dry needling    PT Next Visit Plan  RUE RESTRICTED FOR BP; PT HAS RA SO GENTLE STRENGTHENING AND ROM.  schedule for aquatic.  Initiate HEP for LE strengthening, balance, gentle ROM.    Recommended Other Services  aquatic    Consulted and Agree with Plan of Care  Patient       Patient will benefit from skilled therapeutic intervention in order to improve the following deficits and impairments:  Decreased balance, Decreased range of motion, Decreased strength, Difficulty walking, Pain  Visit Diagnosis: Unsteadiness on feet  Muscle weakness (generalized)  Pain in right hip  Difficulty in walking, not elsewhere classified     Problem List Patient Active Problem List   Diagnosis Date Noted  . Pruritus of scalp 06/20/2018  . DDD (degenerative disc disease), lumbar 05/27/2018  . Essential hypertension 03/19/2017  . History of pityriasis rosea 03/19/2017  . 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 History of malignant neoplasm of breast 03/19/2017  . Eczema 03/05/2017  . Vulvar dermatitis 03/05/2017  . Rheumatoid arthritis  10/15/2016  . High risk medication use 10/15/2016  . 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 S/P lumpectomy of breast 12/03/2011  . 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

## 2020-03-21 ENCOUNTER — Ambulatory Visit: Payer: Medicare PPO

## 2020-03-21 ENCOUNTER — Ambulatory Visit
Admission: RE | Admit: 2020-03-21 | Discharge: 2020-03-21 | Disposition: A | Discharge status: Home / Self Care | Payer: Medicare PPO | Source: Ambulatory Visit | Attending: Family Medicine | Admitting: Family Medicine

## 2020-03-21 ENCOUNTER — Other Ambulatory Visit: Payer: Self-pay

## 2020-03-21 DIAGNOSIS — Z1231 Encounter for screening mammogram for malignant neoplasm of breast: Secondary | ICD-10-CM | POA: Diagnosis not present

## 2020-03-23 ENCOUNTER — Other Ambulatory Visit: Payer: Self-pay

## 2020-03-23 ENCOUNTER — Ambulatory Visit: Payer: Medicare PPO | Admitting: Physical Therapy

## 2020-03-23 DIAGNOSIS — M25551 Pain in right hip: Secondary | ICD-10-CM | POA: Diagnosis not present

## 2020-03-23 DIAGNOSIS — M6281 Muscle weakness (generalized): Secondary | ICD-10-CM

## 2020-03-23 DIAGNOSIS — R262 Difficulty in walking, not elsewhere classified: Secondary | ICD-10-CM | POA: Diagnosis not present

## 2020-03-23 DIAGNOSIS — R2681 Unsteadiness on feet: Secondary | ICD-10-CM

## 2020-03-23 NOTE — Patient Instructions (Signed)
Access Code: [email or web address] URL: [email or web address] Date: 03/23/2020 Prepared by: Misty Stanley  Exercises Supine Lower Trunk Rotation - 1 x daily - 7 x weekly - 1 sets - 10 reps Supine Figure 4 Piriformis Stretch - 1 x daily - 7 x weekly - 2 sets - 20 seconds hold Supine Hip Adduction Isometric with Ball - 1 x daily - 7 x weekly - 2 sets - 5 reps - 5 second hold Hooklying Isometric Hip Abduction with Belt - 1 x daily - 7 x weekly - 2 sets - 5 reps - 5 second hold Half Tandem Stance Balance with Head Rotation - 1 x daily - 7 x weekly - 2 sets - 10 reps Half Tandem Stance Balance with Head Nods - 1 x daily - 7 x weekly - 2 sets - 10 reps Narrow Base of Support Standing Balance Eyes Closed on Foam Pad - 1 x daily - 7 x weekly - 2 sets - 10 reps

## 2020-03-23 NOTE — Therapy (Signed)
[facility] [address] [place], [place], [address] Phone: [phone]   Fax:  [phone]  Physical Therapy Treatment  Patient Details  Name: Melissa Perez MRN: [id number] Date of Birth: 07/13/1946 Referring Provider (PT): Elyn Peers, MD   Encounter Date: 03/23/2020  PT End of Session - 03/23/20 1038    Visit Number  2    Number of Visits  17    Date for PT Re-Evaluation  05/16/20    Authorization Type  [employer]; $20 copay; 10th visit PN    Progress Note Due on Visit  10    PT Start Time  0805    PT Stop Time  0845    PT Time Calculation (min)  40 min    Activity Tolerance  Patient tolerated treatment well    Behavior During Therapy  [facility] for tasks assessed/performed       Past Medical History:  Diagnosis Date  . Arthritis    rheumatoid, take Humira  . Arthritis    rheumatoid  . Arthritis pain   . Back pain   . Breast cancer ([place])   . Cancer ([place])   . History of dental surgery   . Hyperlipidemia   . Hypertension   . Personal history of radiation therapy   . Rash   . Rheumatoid arthritis ([place])   . S/P lumpectomy of breast 12/03/2011    Past Surgical History:  Procedure Laterality Date  . ABDOMINAL HYSTERECTOMY    . APPENDECTOMY    . BREAST EXCISIONAL BIOPSY Left 1991   benign  . BREAST LUMPECTOMY Right 11/2011  . BREAST SURGERY     2 left breast biopsies-benign  . left breast biopsies  1991 & 1990    There were no vitals filed for this visit.  Subjective Assessment - 03/23/20 0806    Subjective  Is doing well, no pain or issues to report.  Has still been walking each morning. No falls but plenty of staggering.    Pertinent History  RA, DDD, osteoporosis, low back pain, HTN, SI joint pain, R malignant neoplasm of breast with radiation, metatarsalgiaUse LUE for blood pressure    Limitations  Standing;Walking    How long can you walk comfortably?  40-50 minutes or 2 miles, 5 per week     Patient Stated Goals  To find out what is causing the staggering, doesn't want to fall    Currently in Pain?  Yes    Pain Score  5     Pain Location  Pelvis        Reviewed and had pt return demonstrate the following exercises for LE ROM, strengthening and balance training:  Access Code: [email or web address] URL: [email or web address] Date: 03/23/2020 Prepared by: Misty Stanley  Exercises Supine Lower Trunk Rotation - 1 x daily - 7 x weekly - 1 sets - 10 reps Supine Figure 4 Piriformis Stretch - 1 x daily - 7 x weekly - 2 sets - 20 seconds hold Supine Hip Adduction Isometric with Ball - 1 x daily - 7 x weekly - 2 sets - 5 reps - 5 second hold Hooklying Isometric Hip Abduction with Belt - 1 x daily - 7 x weekly - 2 sets - 5 reps - 5 second hold Half Tandem Stance Balance with Head Rotation - 1 x daily - 7 x weekly - 2 sets - 10 reps Half Tandem Stance Balance with Head Nods - 1 x daily - 7 x weekly - 2  sets - 10 reps Narrow Base of Support Standing Balance Eyes Closed on Foam Pad - 1 x daily - 7 x weekly - 2 sets - 10 reps   PT Education - 03/23/20 1027    Education Details  initiated balance and strengthening HEP    Person(s) Educated  Patient    Methods  Explanation;Demonstration    Comprehension  Verbalized understanding;Returned demonstration       PT Short Term Goals - 03/17/20 0907      PT SHORT TERM GOAL #1   Title  Pt will initiate and demonstrate independence with HEP    Time  4    Period  Weeks    Status  New    Target Date  04/16/20      PT SHORT TERM GOAL #2   Title  Pt will initiate aquatic therapy    Time  4    Period  Weeks    Status  New    Target Date  04/16/20      PT SHORT TERM GOAL #3   Title  Pt will demonstrate 2 point improvement in FGA to indicate decreased falls risk    Baseline  21/30    Time  4    Period  Weeks    Status  New    Target Date  04/16/20      PT SHORT TERM GOAL #4   Title  Pt will report 25% reduction in lumbar, SI  and R hip pain    Time  4    Period  Weeks    Status  New    Target Date  04/16/20        PT Long Term Goals - 03/17/20 0909      PT LONG TERM GOAL #1   Title  Pt will demonstrate independence with final land and aquatic HEP    Time  8    Period  Weeks    Status  New    Target Date  05/16/20      PT LONG TERM GOAL #2   Title  Pt will demonstrate decreased falls risk as indicated by increase in FGA to >/= 25/30    Time  8    Period  Weeks    Status  New    Target Date  05/16/20      PT LONG TERM GOAL #3   Title  Pt will report 50% reduction in low back, R hip and SI joint pain    Time  8    Period  Weeks    Status  New    Target Date  05/16/20      PT LONG TERM GOAL #4   Title  Pt will report being able to perform her daily walks without using golf club as AD for stability    Time  8    Period  Weeks    Status  New    Target Date  05/16/20      PT LONG TERM GOAL #5   Title  Pt will demonstrate ability to safely use gardening bench to decrease strain on low back and pelvis    Time  8    Period  Weeks    Status  New    Target Date  05/16/20            Plan - 03/23/20 1039    Clinical Impression Statement  Treatment session focused on initiation of LE strengthening and balance HEP.  For  SI joint performed gentle piriformis stretches, trunk rotations and isometric strengthening.  Incorporated balance exercises focusing on partial tandem stance and eye closed.  Pt tolerated well but demonstrated lack of trunk and postural control/stability.  Will continue to progress towards LTG.    Personal Factors and Comorbidities  Comorbidity 3+    Comorbidities  RA, DDD, osteoporosis, low back pain, HTN, SI joint pain, R malignant neoplasm of breast with radiation, metatarsalgiaUse LUE for blood pressure    Examination-Activity Limitations  Bend;Locomotion Level;Squat;Stairs;Stand    Examination-Participation Restrictions  Community Activity;Yard Work    Stability/Clinical  Decision Making  Stable/Uncomplicated    Rehab Potential  Good    PT Frequency  2x / week    PT Duration  8 weeks    PT Treatment/Interventions  ADLs/Self Care Home Management;Aquatic Therapy;Cryotherapy;Moist Heat;Gait training;Stair training;Functional mobility training;Therapeutic activities;Therapeutic exercise;Balance training;Neuromuscular re-education;Patient/family education;Manual techniques;Passive range of motion;Dry needling    PT Next Visit Plan  RUE RESTRICTED FOR BP; PT HAS RA SO GENTLE STRENGTHENING AND ROM.  schedule for aquatic.  How is she tolerating HEP?  Continue to work on standing balance with narrow BOS, compliant surfaces and vision removed.    Consulted and Agree with Plan of Care  Patient       Patient will benefit from skilled therapeutic intervention in order to improve the following deficits and impairments:  Decreased balance, Decreased range of motion, Decreased strength, Difficulty walking, Pain  Visit Diagnosis: Unsteadiness on feet  Muscle weakness (generalized)  Pain in right hip  Difficulty in walking, not elsewhere classified     Problem List Patient Active Problem List   Diagnosis Date Noted  . Pruritus of scalp 06/20/2018  . DDD (degenerative disc disease), lumbar 05/27/2018  . Essential hypertension 03/19/2017  . History of pityriasis rosea 03/19/2017  . 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 History of malignant neoplasm of breast 03/19/2017  . Eczema 03/05/2017  . Vulvar dermatitis 03/05/2017  . Rheumatoid arthritis  10/15/2016  . High risk medication use 10/15/2016  . 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 S/P lumpectomy of breast 12/03/2011  . 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 [place], [place],  [address] Phone: [phone]   Fax:  [phone]  Name: CHANYAH ISAAK MRN: [id number] Date of Birth: 27-Oct-1946

## 2020-03-25 ENCOUNTER — Ambulatory Visit: Payer: Medicare PPO

## 2020-03-25 ENCOUNTER — Other Ambulatory Visit: Payer: Self-pay

## 2020-03-25 DIAGNOSIS — R262 Difficulty in walking, not elsewhere classified: Secondary | ICD-10-CM

## 2020-03-25 DIAGNOSIS — M6281 Muscle weakness (generalized): Secondary | ICD-10-CM | POA: Diagnosis not present

## 2020-03-25 DIAGNOSIS — M25551 Pain in right hip: Secondary | ICD-10-CM

## 2020-03-25 DIAGNOSIS — R2681 Unsteadiness on feet: Secondary | ICD-10-CM | POA: Diagnosis not present

## 2020-03-25 NOTE — Therapy (Signed)
[facility] [address] [place], [place], [address] Phone: [phone]   Fax:  [phone]  Physical Therapy Treatment  Patient Details  Name: Melissa Perez MRN: [id number] Date of Birth: 1945/12/13 Referring Provider (PT): Elyn Peers, MD   Encounter Date: 03/25/2020  PT End of Session - 03/25/20 0806    Visit Number  3    Number of Visits  17    Date for PT Re-Evaluation  05/16/20    Authorization Type  [employer]; $20 copay; 10th visit PN    Progress Note Due on Visit  10    PT Start Time  0759    PT Stop Time  0843    PT Time Calculation (min)  44 min    Equipment Utilized During Treatment  Gait belt    Activity Tolerance  Patient tolerated treatment well    Behavior During Therapy  [facility] for tasks assessed/performed       Past Medical History:  Diagnosis Date  . Arthritis    rheumatoid, take Humira  . Arthritis    rheumatoid  . Arthritis pain   . Back pain   . Breast cancer ([place])   . Cancer ([place])   . History of dental surgery   . Hyperlipidemia   . Hypertension   . Personal history of radiation therapy   . Rash   . Rheumatoid arthritis ([place])   . S/P lumpectomy of breast 12/03/2011    Past Surgical History:  Procedure Laterality Date  . ABDOMINAL HYSTERECTOMY    . APPENDECTOMY    . BREAST EXCISIONAL BIOPSY Left 1991   benign  . BREAST LUMPECTOMY Right 11/2011  . BREAST SURGERY     2 left breast biopsies-benign  . left breast biopsies  1991 & 1990    There were no vitals filed for this visit.  Subjective Assessment - 03/25/20 0800    Subjective  Has been having soreness in the low back region. Did complete some of the head turns and walked about 50 minutes (approx 2 miles) yesterday, but due to being busy was unable to complete all of her HEP. No falls but does report some staggering.    Pertinent History  RA, DDD, osteoporosis, low back pain, HTN, SI joint pain, R malignant neoplasm  of breast with radiation, metatarsalgiaUse LUE for blood pressure    Limitations  Standing;Walking    How long can you walk comfortably?  40-50 minutes or 2 miles, 5 per week    Patient Stated Goals  To find out what is causing the staggering, doesn't want to fall    Currently in Pain?  No/denies                       [facility] Adult PT Treatment/Exercise - 03/25/20 0845      Ambulation/Gait   Ambulation/Gait  Yes    Ambulation/Gait Assistance  7: Independent    Ambulation Distance (Feet)  100 Feet   ambulation around gym with activities    Assistive device  None    Gait Pattern  Step-through pattern;Antalgic    Ambulation Surface  Level;Indoor          Balance Exercises - 03/25/20 0845      Balance Exercises: Standing   Standing Eyes Opened  Narrow base of support (BOS);Head turns;Foam/compliant surface;Other reps (comment)   10 x 2, vertical and horizontal   Standing Eyes Closed  Narrow base of support (BOS);Foam/compliant surface;2  reps;30 secs    Tandem Stance  Eyes open;Eyes closed;Foam/compliant surface;2 reps;30 secs    Rockerboard  Anterior/posterior;EO;30 seconds;Intermittent UE support    Tandem Gait  Forward;Intermittent upper extremity support   25' x 6, intermittent CGA from PT   Marching  Forwards;Other reps (comment);Solid surface   25' x 6   Other Standing Exercises  Backwards walking in hallway, 25' x 4. Verbal cues for improved step length. Intermittent CGA for steadying. On rockerboard in // bars (position ant/post), completed alternating toe taps to 4" step w/o UE support. verbal cues for improved foot placement on rockerboard. Completed mini squats x 10 reps on rockerboard., intermittent CGA, increased sway noted with completion.           PT Short Term Goals - 03/17/20 0907      PT SHORT TERM GOAL #1   Title  Pt will initiate and demonstrate independence with HEP    Time  4    Period  Weeks    Status  New    Target Date  04/16/20       PT SHORT TERM GOAL #2   Title  Pt will initiate aquatic therapy    Time  4    Period  Weeks    Status  New    Target Date  04/16/20      PT SHORT TERM GOAL #3   Title  Pt will demonstrate 2 point improvement in FGA to indicate decreased falls risk    Baseline  21/30    Time  4    Period  Weeks    Status  New    Target Date  04/16/20      PT SHORT TERM GOAL #4   Title  Pt will report 25% reduction in lumbar, SI and R hip pain    Time  4    Period  Weeks    Status  New    Target Date  04/16/20        PT Long Term Goals - 03/17/20 0909      PT LONG TERM GOAL #1   Title  Pt will demonstrate independence with final land and aquatic HEP    Time  8    Period  Weeks    Status  New    Target Date  05/16/20      PT LONG TERM GOAL #2   Title  Pt will demonstrate decreased falls risk as indicated by increase in FGA to >/= 25/30    Time  8    Period  Weeks    Status  New    Target Date  05/16/20      PT LONG TERM GOAL #3   Title  Pt will report 50% reduction in low back, R hip and SI joint pain    Time  8    Period  Weeks    Status  New    Target Date  05/16/20      PT LONG TERM GOAL #4   Title  Pt will report being able to perform her daily walks without using golf club as AD for stability    Time  8    Period  Weeks    Status  New    Target Date  05/16/20      PT LONG TERM GOAL #5   Title  Pt will demonstrate ability to safely use gardening bench to decrease strain on low back and pelvis    Time  8  Period  Weeks    Status  New    Target Date  05/16/20            Plan - 03/25/20 0853    Clinical Impression Statement  Today's session focused on balance and dynamic gait activities. Patient demonstrates increased sway and unsteadiness with balance activites requirng eyes closed or tandem stance. With tandem gait patient requiring CGA for steadying at this time. Patient reporting some soreness, but overall no pain in lumbar/SI region today. Patient will  continue to benefit from skilled PT services to address deficits and progress toward short/long term goals.    Personal Factors and Comorbidities  Comorbidity 3+    Comorbidities  RA, DDD, osteoporosis, low back pain, HTN, SI joint pain, R malignant neoplasm of breast with radiation, metatarsalgiaUse LUE for blood pressure    Examination-Activity Limitations  Bend;Locomotion Level;Squat;Stairs;Stand    Examination-Participation Restrictions  Community Activity;Yard Work    Stability/Clinical Decision Making  Stable/Uncomplicated    Rehab Potential  Good    PT Frequency  2x / week    PT Duration  8 weeks    PT Treatment/Interventions  ADLs/Self Care Home Management;Aquatic Therapy;Cryotherapy;Moist Heat;Gait training;Stair training;Functional mobility training;Therapeutic activities;Therapeutic exercise;Balance training;Neuromuscular re-education;Patient/family education;Manual techniques;Passive range of motion;Dry needling    PT Next Visit Plan  RUE RESTRICTED FOR BP; PT HAS RA SO GENTLE STRENGTHENING AND ROM.  How is HEP?  Continue balance with standing balance with narrow BOS, compliant surfaces and vision removed.    Consulted and Agree with Plan of Care  Patient       Patient will benefit from skilled therapeutic intervention in order to improve the following deficits and impairments:  Decreased balance, Decreased range of motion, Decreased strength, Difficulty walking, Pain  Visit Diagnosis: Unsteadiness on feet  Muscle weakness (generalized)  Pain in right hip  Difficulty in walking, not elsewhere classified     Problem List Patient Active Problem List   Diagnosis Date Noted  . Pruritus of scalp 06/20/2018  . DDD (degenerative disc disease), lumbar 05/27/2018  . Essential hypertension 03/19/2017  . History of pityriasis rosea 03/19/2017  . 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 History of malignant neoplasm of breast 03/19/2017  . Eczema 03/05/2017  . Vulvar  dermatitis 03/05/2017  . Rheumatoid arthritis  10/15/2016  . High risk medication use 10/15/2016  . 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 S/P lumpectomy of breast 12/03/2011  . 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[place]) 11/01/2011    Jones Bales, PT, DPT 03/25/2020, 8:58 AM  [facility] [address] [place], [place], [address] Phone: [phone]   Fax:  [phone]  Name: Melissa Perez MRN: [id number] Date of Birth: November 22, 1946

## 2020-03-28 ENCOUNTER — Telehealth: Payer: Self-pay | Admitting: Rheumatology

## 2020-03-28 DIAGNOSIS — Z79899 Other long term (current) drug therapy: Secondary | ICD-10-CM

## 2020-03-28 NOTE — Telephone Encounter (Signed)
Lab orders released for [facility]. I attempted to contact patient and left message on machine to advise patient orders have been sent.

## 2020-03-28 NOTE — Telephone Encounter (Signed)
Patient called requesting her labwork orders be sent to [facility] on [address].  Patient states she scheduled appointment for Thursday, 03/31/20.

## 2020-03-29 ENCOUNTER — Other Ambulatory Visit: Payer: Self-pay

## 2020-03-29 ENCOUNTER — Ambulatory Visit: Payer: Medicare PPO

## 2020-03-29 DIAGNOSIS — M25551 Pain in right hip: Secondary | ICD-10-CM

## 2020-03-29 DIAGNOSIS — R262 Difficulty in walking, not elsewhere classified: Secondary | ICD-10-CM | POA: Diagnosis not present

## 2020-03-29 DIAGNOSIS — R2681 Unsteadiness on feet: Secondary | ICD-10-CM

## 2020-03-29 DIAGNOSIS — M6281 Muscle weakness (generalized): Secondary | ICD-10-CM

## 2020-03-29 NOTE — Therapy (Signed)
[facility] [address] [place], [place], [address] Phone: [phone]   Fax:  [phone]  Physical Therapy Treatment  Patient Details  Name: Melissa Perez MRN: [id number] Date of Birth: 01/17/1946 Referring Provider (PT): Elyn Peers, MD   Encounter Date: 03/29/2020  PT End of Session - 03/29/20 0849    Visit Number  4    Number of Visits  17    Date for PT Re-Evaluation  05/16/20    Authorization Type  [employer]; $20 copay; 10th visit PN    Progress Note Due on Visit  10    PT Start Time  0845    PT Stop Time  0930    PT Time Calculation (min)  45 min    Equipment Utilized During Treatment  Gait belt    Activity Tolerance  Patient tolerated treatment well    Behavior During Therapy  [facility] for tasks assessed/performed       Past Medical History:  Diagnosis Date  . Arthritis    rheumatoid, take Humira  . Arthritis    rheumatoid  . Arthritis pain   . Back pain   . Breast cancer ([place])   . Cancer ([place])   . History of dental surgery   . Hyperlipidemia   . Hypertension   . Personal history of radiation therapy   . Rash   . Rheumatoid arthritis ([place])   . S/P lumpectomy of breast 12/03/2011    Past Surgical History:  Procedure Laterality Date  . ABDOMINAL HYSTERECTOMY    . APPENDECTOMY    . BREAST EXCISIONAL BIOPSY Left 1991   benign  . BREAST LUMPECTOMY Right 11/2011  . BREAST SURGERY     2 left breast biopsies-benign  . left breast biopsies  1991 & 1990    There were no vitals filed for this visit.  Subjective Assessment - 03/29/20 0846    Subjective  Patient reports she feels like she is getting better, no soreness today. Walked this morning prior to the session. No falls since last visit. Patient reporting that she has not been able to complete all exercises in her HEP.    Pertinent History  RA, DDD, osteoporosis, low back pain, HTN, SI joint pain, R malignant neoplasm of breast with  radiation, metatarsalgiaUse LUE for blood pressure    Limitations  Standing;Walking    How long can you walk comfortably?  40-50 minutes or 2 miles, 5 per week    Patient Stated Goals  To find out what is causing the staggering, doesn't want to fall    Currently in Pain?  No/denies                       [facility] Adult PT Treatment/Exercise - 03/29/20 0855      Ambulation/Gait   Ambulation/Gait  Yes    Ambulation/Gait Assistance  7: Independent    Ambulation Distance (Feet)  115 Feet    Assistive device  None    Gait Pattern  Step-through pattern;Antalgic    Ambulation Surface  Level;Indoor      Neuro Re-ed    Neuro Re-ed Details   Completed the following dynamic balance activities on blue mat: including forward step over cones 10' x 3, progressed to include SLS on stance and alternating tap to cone with opposite lower extremity for further improvements in balance, 10' x 4. Completed side stepping over cones 10' x 6, verbal cues required to improve hip/knee flexion  and avoid circumduction with completion of side stepping. Tandem walking 10' x 6 with intermittent UE support from counter for steadying. Also, completed tandem stance with eyes closed, 3 x 30 secs each alternating leading lower extremity. Completed alternating SLS with patient able to hold 20-25 secs, intermittent CGA required.       Exercises   Exercises  Knee/Hip      Knee/Hip Exercises: Stretches   Piriformis Stretch  Both;2 reps;30 seconds    Piriformis Stretch Limitations  supine figure 4 piriformis stretch, verbal cues required for proper completion.       Knee/Hip Exercises: Supine   Other Supine Knee/Hip Exercises  Completed the following exercises included in the HEP for patient review: supine isometric hip adduction with ball, 2 x 10 reps with 5 sec hold. Supine isometric hip abduction with belt, 2 x 10 reps with 5 sec hold.              PT Education - 03/29/20 0930    Education Details   Educated on HEP Update (Tandem Walking)    Person(s) Educated  Patient    Methods  Explanation;Demonstration;Handout    Comprehension  Verbalized understanding       PT Short Term Goals - 03/17/20 0907      PT SHORT TERM GOAL #1   Title  Pt will initiate and demonstrate independence with HEP    Time  4    Period  Weeks    Status  New    Target Date  04/16/20      PT SHORT TERM GOAL #2   Title  Pt will initiate aquatic therapy    Time  4    Period  Weeks    Status  New    Target Date  04/16/20      PT SHORT TERM GOAL #3   Title  Pt will demonstrate 2 point improvement in FGA to indicate decreased falls risk    Baseline  21/30    Time  4    Period  Weeks    Status  New    Target Date  04/16/20      PT SHORT TERM GOAL #4   Title  Pt will report 25% reduction in lumbar, SI and R hip pain    Time  4    Period  Weeks    Status  New    Target Date  04/16/20        PT Long Term Goals - 03/17/20 0909      PT LONG TERM GOAL #1   Title  Pt will demonstrate independence with final land and aquatic HEP    Time  8    Period  Weeks    Status  New    Target Date  05/16/20      PT LONG TERM GOAL #2   Title  Pt will demonstrate decreased falls risk as indicated by increase in FGA to >/= 25/30    Time  8    Period  Weeks    Status  New    Target Date  05/16/20      PT LONG TERM GOAL #3   Title  Pt will report 50% reduction in low back, R hip and SI joint pain    Time  8    Period  Weeks    Status  New    Target Date  05/16/20      PT LONG TERM GOAL #4   Title  Pt  will report being able to perform her daily walks without using golf club as AD for stability    Time  8    Period  Weeks    Status  New    Target Date  05/16/20      PT LONG TERM GOAL #5   Title  Pt will demonstrate ability to safely use gardening bench to decrease strain on low back and pelvis    Time  8    Period  Weeks    Status  New    Target Date  05/16/20            Plan - 03/29/20  1024    Clinical Impression Statement  Continued today's sesion with progression of balance exercises as tolerated by patient. Patient demonstrating improvements with tandem walking and SLS with today's session, requiring less assistance and UE suppot for completion. Updated patients HEP to include tandem walking and educated on areas within the home to complete this safety. Patient reporting she has had difficulty finding time to complete all of HEP exercises, so reviewed components of the HEP with patient today. Patient will continue to benefit from skilled PT to progress toward short and long term goals.    Personal Factors and Comorbidities  Comorbidity 3+    Comorbidities  RA, DDD, osteoporosis, low back pain, HTN, SI joint pain, R malignant neoplasm of breast with radiation, metatarsalgiaUse LUE for blood pressure    Examination-Activity Limitations  Bend;Locomotion Level;Squat;Stairs;Stand    Examination-Participation Restrictions  Community Activity;Yard Work    Stability/Clinical Decision Making  Stable/Uncomplicated    Rehab Potential  Good    PT Frequency  2x / week    PT Duration  8 weeks    PT Treatment/Interventions  ADLs/Self Care Home Management;Aquatic Therapy;Cryotherapy;Moist Heat;Gait training;Stair training;Functional mobility training;Therapeutic activities;Therapeutic exercise;Balance training;Neuromuscular re-education;Patient/family education;Manual techniques;Passive range of motion;Dry needling    PT Next Visit Plan  RUE RESTRICTED FOR BP; PT HAS RA SO GENTLE STRENGTHENING AND ROM.  How is HEP addition?  Continue static and dynamic balance activities.    Consulted and Agree with Plan of Care  Patient       Patient will benefit from skilled therapeutic intervention in order to improve the following deficits and impairments:  Decreased balance, Decreased range of motion, Decreased strength, Difficulty walking, Pain  Visit Diagnosis: Unsteadiness on feet  Muscle weakness  (generalized)  Pain in right hip  Difficulty in walking, not elsewhere classified     Problem List Patient Active Problem List   Diagnosis Date Noted  . Pruritus of scalp 06/20/2018  . DDD (degenerative disc disease), lumbar 05/27/2018  . Essential hypertension 03/19/2017  . History of pityriasis rosea 03/19/2017  . 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 History of malignant neoplasm of breast 03/19/2017  . Eczema 03/05/2017  . Vulvar dermatitis 03/05/2017  . Rheumatoid arthritis  10/15/2016  . High risk medication use 10/15/2016  . 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 S/P lumpectomy of breast 12/03/2011  . 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[place]) 11/01/2011    Jones Bales, PT, DPT 03/29/2020, 10:29 AM  [facility] [address] [place], [place], [address] Phone: [phone]   Fax:  [phone]  Name: Melissa Perez MRN: [id number] Date of Birth: 1946-11-25

## 2020-03-29 NOTE — Patient Instructions (Signed)
Access Code: [email or web address] URL: [email or web address] Date: 03/29/2020 Prepared by: Baldomero Lamy  Exercises Supine Lower Trunk Rotation - 1 x daily - 7 x weekly - 1 sets - 10 reps Supine Figure 4 Piriformis Stretch - 1 x daily - 7 x weekly - 2 sets - 20 seconds hold Supine Hip Adduction Isometric with Ball - 1 x daily - 7 x weekly - 2 sets - 5 reps - 5 second hold Hooklying Isometric Hip Abduction with Belt - 1 x daily - 7 x weekly - 2 sets - 5 reps - 5 second hold Half Tandem Stance Balance with Head Rotation - 1 x daily - 7 x weekly - 2 sets - 10 reps Half Tandem Stance Balance with Head Nods - 1 x daily - 7 x weekly - 2 sets - 10 reps Narrow Base of Support Standing Balance Eyes Closed on Foam Pad - 1 x daily - 7 x weekly - 2 sets - 10 reps Tandem Walking with Counter Support - 1 x daily - 7 x weekly - 3 sets - 10 reps

## 2020-03-30 DIAGNOSIS — M533 Sacrococcygeal disorders, not elsewhere classified: Secondary | ICD-10-CM | POA: Diagnosis not present

## 2020-03-31 DIAGNOSIS — Z79899 Other long term (current) drug therapy: Secondary | ICD-10-CM | POA: Diagnosis not present

## 2020-03-31 LAB — CBC WITH DIFFERENTIAL/PLATELET
Absolute Monocytes: 773 cells/uL (ref 200–950)
Basophils Absolute: 18 cells/uL (ref 0–200)
Basophils Relative: 0.1 %
Eosinophils Absolute: 0 cells/uL — ABNORMAL LOW (ref 15–500)
Eosinophils Relative: 0 %
HCT: 37.2 % (ref 35.0–45.0)
Hemoglobin: 12.8 g/dL (ref 11.7–15.5)
Lymphs Abs: 736 cells/uL — ABNORMAL LOW (ref 850–3900)
MCH: 35.1 pg — ABNORMAL HIGH (ref 27.0–33.0)
MCHC: 34.4 g/dL (ref 32.0–36.0)
MCV: 101.9 fL — ABNORMAL HIGH (ref 80.0–100.0)
MPV: 10.7 fL (ref 7.5–12.5)
Monocytes Relative: 4.2 %
Neutro Abs: 16873 cells/uL — ABNORMAL HIGH (ref 1500–7800)
Neutrophils Relative %: 91.7 %
Platelets: 207 10*3/uL (ref 140–400)
RBC: 3.65 10*6/uL — ABNORMAL LOW (ref 3.80–5.10)
RDW: 14 % (ref 11.0–15.0)
Total Lymphocyte: 4 %
WBC: 18.4 10*3/uL — ABNORMAL HIGH (ref 3.8–10.8)

## 2020-03-31 LAB — COMPLETE METABOLIC PANEL WITH GFR
AG Ratio: 2.2 (calc) (ref 1.0–2.5)
ALT: 21 U/L (ref 6–29)
AST: 35 U/L (ref 10–35)
Albumin: 4.7 g/dL (ref 3.6–5.1)
Alkaline phosphatase (APISO): 56 U/L (ref 37–153)
BUN: 18 mg/dL (ref 7–25)
CO2: 22 mmol/L (ref 20–32)
Calcium: 9.6 mg/dL (ref 8.6–10.4)
Chloride: 102 mmol/L (ref 98–110)
Creat: 0.79 mg/dL (ref 0.60–0.93)
GFR, Est African American: 87 mL/min/{1.73_m2} (ref >=60)
GFR, Est Non African American: 75 mL/min/{1.73_m2} (ref >=60)
Globulin: 2.1 g/dL (calc) (ref 1.9–3.7)
Glucose, Bld: 102 mg/dL — ABNORMAL HIGH (ref 65–99)
Potassium: 4 mmol/L (ref 3.5–5.3)
Sodium: 138 mmol/L (ref 135–146)
Total Bilirubin: 0.5 mg/dL (ref 0.2–1.2)
Total Protein: 6.8 g/dL (ref 6.1–8.1)

## 2020-04-01 ENCOUNTER — Ambulatory Visit: Payer: Medicare PPO

## 2020-04-01 ENCOUNTER — Other Ambulatory Visit: Payer: Self-pay

## 2020-04-01 DIAGNOSIS — M25551 Pain in right hip: Secondary | ICD-10-CM

## 2020-04-01 DIAGNOSIS — R262 Difficulty in walking, not elsewhere classified: Secondary | ICD-10-CM

## 2020-04-01 DIAGNOSIS — R2681 Unsteadiness on feet: Secondary | ICD-10-CM | POA: Diagnosis not present

## 2020-04-01 DIAGNOSIS — M6281 Muscle weakness (generalized): Secondary | ICD-10-CM | POA: Diagnosis not present

## 2020-04-01 NOTE — Therapy (Signed)
[facility] [address] [place], [place], [address] Phone: [phone]   Fax:  [phone]  Physical Therapy Treatment  Patient Details  Name: Melissa Perez MRN: [id number] Date of Birth: 08/16/1946 Referring Provider (PT): Elyn Peers, MD   Encounter Date: 04/01/2020  PT End of Session - 04/01/20 0802    Visit Number  5    Number of Visits  17    Date for PT Re-Evaluation  05/16/20    Authorization Type  [employer]; $20 copay; 10th visit PN    Progress Note Due on Visit  10    PT Start Time  0758    PT Stop Time  0842    PT Time Calculation (min)  44 min    Equipment Utilized During Treatment  Gait belt    Activity Tolerance  Patient tolerated treatment well    Behavior During Therapy  [facility] for tasks assessed/performed       Past Medical History:  Diagnosis Date  . Arthritis    rheumatoid, take Humira  . Arthritis    rheumatoid  . Arthritis pain   . Back pain   . Breast cancer ([place])   . Cancer ([place])   . History of dental surgery   . Hyperlipidemia   . Hypertension   . Personal history of radiation therapy   . Rash   . Rheumatoid arthritis ([place])   . S/P lumpectomy of breast 12/03/2011    Past Surgical History:  Procedure Laterality Date  . ABDOMINAL HYSTERECTOMY    . APPENDECTOMY    . BREAST EXCISIONAL BIOPSY Left 1991   benign  . BREAST LUMPECTOMY Right 11/2011  . BREAST SURGERY     2 left breast biopsies-benign  . left breast biopsies  1991 & 1990    There were no vitals filed for this visit.  Subjective Assessment - 04/01/20 0759    Subjective  Patient reporting that she got a cortizone shot near her R hip, and reports that this has providing her with some pain relief. Patient reporting she is able to start getting all her exercises completed in the HEP, still believes tandem walking is the most challenging. No falls.    Pertinent History  RA, DDD, osteoporosis, low back pain, HTN, SI  joint pain, R malignant neoplasm of breast with radiation, metatarsalgiaUse LUE for blood pressure    Limitations  Standing;Walking    How long can you walk comfortably?  40-50 minutes or 2 miles, 5 per week    Patient Stated Goals  To find out what is causing the staggering, doesn't want to fall    Currently in Pain?  No/denies                       [facility] Adult PT Treatment/Exercise - 04/01/20 0816      Ambulation/Gait   Ambulation/Gait  Yes    Ambulation/Gait Assistance  5: Supervision    Ambulation/Gait Assistance Details  Gait training outside on outdoor surfaces including pavement and grass for further assessment of mobility within the community. Patient ascend/descend one curb. Patient demonstrating good stability with outdoors surfaces and gait speed.     Ambulation Distance (Feet)  1200 Feet    Assistive device  None    Gait Pattern  Step-through pattern;Antalgic    Ambulation Surface  Unlevel;Outdoor;Paved;Grass    Curb  7: Independent      High Level Balance   High Level Balance Activities  Marching forwards;Backward walking;Tandem walking    High Level Balance Comments  All the following balance actvities completed outside on unlevel grass surface for further challenge to balance. Completed forward marching, backwards walking and tandem walking 40" x 6 each. Patient require intermittent CGA with all activties for steadying. With tandem walking patient demonstrate one instance of LOB laterally, requiring min guard.      Exercises   Exercises  Other Exercises    Other Exercises   Completed alternating trunk rotations with feet on blue therapy ball, 2 x 15 reps to R/L. Verbal cues for improved control and core contraction during completion. Completed cat/cow with focus on 4-5 sec hold at end range to further promote stretch. Verbal cues for form with cat/cow.            Balance Exercises - 04/01/20 0849      Balance Exercises: Standing   Standing Eyes Closed   Narrow base of support (BOS);Foam/compliant surface;30 secs;3 reps    Other Standing Exercises  Also completed feet together with horizontal head turns with eyes open on foam, 2 x 10 reps. Progressed to completed with eyes closed, 2 x 10 reps, instability noted with first set of horizontal head turns but noticeable improving with second set. Intermittent UE support from chair as needed.           PT Short Term Goals - 03/17/20 0907      PT SHORT TERM GOAL #1   Title  Pt will initiate and demonstrate independence with HEP    Time  4    Period  Weeks    Status  New    Target Date  04/16/20      PT SHORT TERM GOAL #2   Title  Pt will initiate aquatic therapy    Time  4    Period  Weeks    Status  New    Target Date  04/16/20      PT SHORT TERM GOAL #3   Title  Pt will demonstrate 2 point improvement in FGA to indicate decreased falls risk    Baseline  21/30    Time  4    Period  Weeks    Status  New    Target Date  04/16/20      PT SHORT TERM GOAL #4   Title  Pt will report 25% reduction in lumbar, SI and R hip pain    Time  4    Period  Weeks    Status  New    Target Date  04/16/20        PT Long Term Goals - 03/17/20 0909      PT LONG TERM GOAL #1   Title  Pt will demonstrate independence with final land and aquatic HEP    Time  8    Period  Weeks    Status  New    Target Date  05/16/20      PT LONG TERM GOAL #2   Title  Pt will demonstrate decreased falls risk as indicated by increase in FGA to >/= 25/30    Time  8    Period  Weeks    Status  New    Target Date  05/16/20      PT LONG TERM GOAL #3   Title  Pt will report 50% reduction in low back, R hip and SI joint pain    Time  8    Period  Weeks  Status  New    Target Date  05/16/20      PT LONG TERM GOAL #4   Title  Pt will report being able to perform her daily walks without using golf club as AD for stability    Time  8    Period  Weeks    Status  New    Target Date  05/16/20      PT  LONG TERM GOAL #5   Title  Pt will demonstrate ability to safely use gardening bench to decrease strain on low back and pelvis    Time  8    Period  Weeks    Status  New    Target Date  05/16/20            Plan - 04/01/20 0854    Clinical Impression Statement  Today's skilled PT session included gait training and high level balance activities outdoors on unlevel surfaces inlcuding grass and pavement. With higher level balance activities outdoors patient require intermittent CGA due to unsteadiness, especially with tandem walking. Patient will continue to benefit from skilled PT services to address balance and improve functional mobility.    Personal Factors and Comorbidities  Comorbidity 3+    Comorbidities  RA, DDD, osteoporosis, low back pain, HTN, SI joint pain, R malignant neoplasm of breast with radiation, metatarsalgiaUse LUE for blood pressure    Examination-Activity Limitations  Bend;Locomotion Level;Squat;Stairs;Stand    Examination-Participation Restrictions  Community Activity;Yard Work    Stability/Clinical Decision Making  Stable/Uncomplicated    Rehab Potential  Good    PT Frequency  2x / week    PT Duration  8 weeks    PT Treatment/Interventions  ADLs/Self Care Home Management;Aquatic Therapy;Cryotherapy;Moist Heat;Gait training;Stair training;Functional mobility training;Therapeutic activities;Therapeutic exercise;Balance training;Neuromuscular re-education;Patient/family education;Manual techniques;Passive range of motion;Dry needling    PT Next Visit Plan  RUE RESTRICTED FOR BP; PT HAS RA SO GENTLE STRENGTHENING AND ROM.  Continue static and dynamic balance activities. SLS and Tandem on Complaint surfaces.    Consulted and Agree with Plan of Care  Patient       Patient will benefit from skilled therapeutic intervention in order to improve the following deficits and impairments:  Decreased balance, Decreased range of motion, Decreased strength, Difficulty walking,  Pain  Visit Diagnosis: Unsteadiness on feet  Muscle weakness (generalized)  Pain in right hip  Difficulty in walking, not elsewhere classified     Problem List Patient Active Problem List   Diagnosis Date Noted  . Pruritus of scalp 06/20/2018  . DDD (degenerative disc disease), lumbar 05/27/2018  . Essential hypertension 03/19/2017  . History of pityriasis rosea 03/19/2017  . 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 History of malignant neoplasm of breast 03/19/2017  . Eczema 03/05/2017  . Vulvar dermatitis 03/05/2017  . Rheumatoid arthritis  10/15/2016  . High risk medication use 10/15/2016  . 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 S/P lumpectomy of breast 12/03/2011  . 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[place]) 11/01/2011    Jones Bales, PT, DPT 04/01/2020, 8:57 AM  [facility] [address] [place], [place], [address] Phone: [phone]   Fax:  [phone]  Name: Melissa Perez MRN: [id number] Date of Birth: 11-17-1946

## 2020-04-04 ENCOUNTER — Telehealth: Payer: Self-pay | Admitting: Rheumatology

## 2020-04-04 ENCOUNTER — Other Ambulatory Visit: Payer: Self-pay

## 2020-04-04 DIAGNOSIS — Z79899 Other long term (current) drug therapy: Secondary | ICD-10-CM

## 2020-04-04 NOTE — Telephone Encounter (Signed)
Patient returned your call.

## 2020-04-04 NOTE — Telephone Encounter (Signed)
Please advise patient to return in 1 month to recheck CBC.

## 2020-04-04 NOTE — Telephone Encounter (Signed)
Returned patient's call and advised of lab results. See lab note for further details.

## 2020-04-05 ENCOUNTER — Other Ambulatory Visit: Payer: Self-pay

## 2020-04-05 ENCOUNTER — Ambulatory Visit: Payer: Medicare PPO | Attending: Family Medicine

## 2020-04-05 DIAGNOSIS — R262 Difficulty in walking, not elsewhere classified: Secondary | ICD-10-CM | POA: Diagnosis not present

## 2020-04-05 DIAGNOSIS — R2681 Unsteadiness on feet: Secondary | ICD-10-CM | POA: Diagnosis not present

## 2020-04-05 DIAGNOSIS — M6281 Muscle weakness (generalized): Secondary | ICD-10-CM | POA: Diagnosis not present

## 2020-04-05 DIAGNOSIS — M25551 Pain in right hip: Secondary | ICD-10-CM | POA: Insufficient documentation

## 2020-04-05 NOTE — Patient Instructions (Signed)
Access Code: [email or web address] URL: [email or web address] Date: 03/29/2020 Prepared by: Baldomero Lamy  Exercises Supine Lower Trunk Rotation - 1 x daily - 7 x weekly - 1 sets - 10 reps Supine Figure 4 Piriformis Stretch - 1 x daily - 7 x weekly - 2 sets - 20 seconds hold Supine Hip Adduction Isometric with Ball - 1 x daily - 7 x weekly - 2 sets - 5 reps - 5 second hold Hooklying Isometric Hip Abduction with Belt - 1 x daily - 7 x weekly - 2 sets - 5 reps - 5 second hold Half Tandem Stance Balance with Head Rotation - 1 x daily - 7 x weekly - 2 sets - 10 reps Half Tandem Stance Balance with Head Nods - 1 x daily - 7 x weekly - 2 sets - 10 reps Narrow Base of Support Standing Balance Eyes Closed on Foam Pad - 1 x daily - 7 x weekly - 2 sets - 10 reps Tandem Walking with Counter Support - 1 x daily - 7 x weekly - 3 sets - 10 reps Side Stepping with Resistance at Thighs and Counter Support - 1 x daily - 7 x weekly - 3 sets - 10 reps

## 2020-04-05 NOTE — Therapy (Signed)
[facility] [address] [place], [place], [address] Phone: [phone]   Fax:  [phone]  Physical Therapy Treatment  Patient Details  Name: Melissa Perez MRN: [id number] Date of Birth: 1946-04-03 Referring Provider (PT): Elyn Peers, MD   Encounter Date: 04/05/2020  PT End of Session - 04/05/20 0850    Visit Number  6    Number of Visits  17    Date for PT Re-Evaluation  05/16/20    Authorization Type  [employer]; $20 copay; 10th visit PN    Progress Note Due on Visit  10    PT Start Time  0846    PT Stop Time  0929    PT Time Calculation (min)  43 min    Equipment Utilized During Treatment  Gait belt    Activity Tolerance  Patient tolerated treatment well    Behavior During Therapy  [facility] for tasks assessed/performed       Past Medical History:  Diagnosis Date  . Arthritis    rheumatoid, take Humira  . Arthritis    rheumatoid  . Arthritis pain   . Back pain   . Breast cancer ([place])   . Cancer ([place])   . History of dental surgery   . Hyperlipidemia   . Hypertension   . Personal history of radiation therapy   . Rash   . Rheumatoid arthritis ([place])   . S/P lumpectomy of breast 12/03/2011    Past Surgical History:  Procedure Laterality Date  . ABDOMINAL HYSTERECTOMY    . APPENDECTOMY    . BREAST EXCISIONAL BIOPSY Left 1991   benign  . BREAST LUMPECTOMY Right 11/2011  . BREAST SURGERY     2 left breast biopsies-benign  . left breast biopsies  1991 & 1990    There were no vitals filed for this visit.  Subjective Assessment - 04/05/20 0848    Subjective  Patient reports that she has been doing well, no new complaints. Reports R Hip is feeling better today. No falls.    Pertinent History  RA, DDD, osteoporosis, low back pain, HTN, SI joint pain, R malignant neoplasm of breast with radiation, metatarsalgiaUse LUE for blood pressure    Limitations  Standing;Walking    How long can you walk  comfortably?  40-50 minutes or 2 miles, 5 per week    Patient Stated Goals  To find out what is causing the staggering, doesn't want to fall    Currently in Pain?  No/denies    Pain Score  --   6.5   Pain Location  Shoulder    Pain Orientation  Right    Pain Descriptors / Indicators  Aching    Pain Type  Chronic pain                       [facility] Adult PT Treatment/Exercise - 04/05/20 0853      Ambulation/Gait   Ambulation/Gait  Yes    Ambulation/Gait Assistance  6: Modified independent (Device/Increase time)    Ambulation/Gait Assistance Details  completed gait throughout therapy gym with todays session    Ambulation Distance (Feet)  150 Feet    Assistive device  None    Gait Pattern  Step-through pattern;Antalgic    Ambulation Surface  Level;Indoor      High Level Balance   High Level Balance Activities  Negotiating over obstacles;Side stepping    High Level Balance Comments  Completed balance activities in  hallway, including forward stepovers (over orange hurdles), verbal cues for keeping pace to avoid slowing of speed to negotiate over obstacle, 15' x 3. Completed lateral stepover (over orange hurdles), 15' x 3. Patient requiring verbal cues for foot placement to have adequate room for following lower extremity.       Neuro Re-ed    Neuro Re-ed Details   Completed standing balance activities in // bars: on bosu ball focus on holding steady 2 x 1 minute, w/o UE support. Standing on bosu completed ball toss (with 1# weight ball) to PT tech with PT providing CGA as needed for steadying.      Knee/Hip Exercises: Standing   Other Standing Knee Exercises  Completed side stepping with red theraband, 20' x 3. Verbal cues to keep hips pointed forward for proper musculature recruitment. Educated on completing along hallway or countertop for steadying assist.       Knee/Hip Exercises: Supine   Hip Adduction Isometric  Strengthening;Both;2 sets;10 reps    Hip Adduction  Isometric Limitations  isometric hip adduction in hookyling with ball squeeze.       Knee/Hip Exercises: Sidelying   Hip ABduction  Strengthening;Both;2 sets;10 reps    Hip ABduction Limitations  verbal cues for improved hip alignment and knee extension with completion             PT Education - 04/05/20 0930    Education Details  Educated on HEP Update (Side Stepping with Red Theraband along Countertop)    Person(s) Educated  Patient    Methods  Explanation;Demonstration;Handout    Comprehension  Verbalized understanding       PT Short Term Goals - 03/17/20 0907      PT SHORT TERM GOAL #1   Title  Pt will initiate and demonstrate independence with HEP    Time  4    Period  Weeks    Status  New    Target Date  04/16/20      PT SHORT TERM GOAL #2   Title  Pt will initiate aquatic therapy    Time  4    Period  Weeks    Status  New    Target Date  04/16/20      PT SHORT TERM GOAL #3   Title  Pt will demonstrate 2 point improvement in FGA to indicate decreased falls risk    Baseline  21/30    Time  4    Period  Weeks    Status  New    Target Date  04/16/20      PT SHORT TERM GOAL #4   Title  Pt will report 25% reduction in lumbar, SI and R hip pain    Time  4    Period  Weeks    Status  New    Target Date  04/16/20        PT Long Term Goals - 03/17/20 0909      PT LONG TERM GOAL #1   Title  Pt will demonstrate independence with final land and aquatic HEP    Time  8    Period  Weeks    Status  New    Target Date  05/16/20      PT LONG TERM GOAL #2   Title  Pt will demonstrate decreased falls risk as indicated by increase in FGA to >/= 25/30    Time  8    Period  Weeks    Status  New  Target Date  05/16/20      PT LONG TERM GOAL #3   Title  Pt will report 50% reduction in low back, R hip and SI joint pain    Time  8    Period  Weeks    Status  New    Target Date  05/16/20      PT LONG TERM GOAL #4   Title  Pt will report being able to  perform her daily walks without using golf club as AD for stability    Time  8    Period  Weeks    Status  New    Target Date  05/16/20      PT LONG TERM GOAL #5   Title  Pt will demonstrate ability to safely use gardening bench to decrease strain on low back and pelvis    Time  8    Period  Weeks    Status  New    Target Date  05/16/20            Plan - 04/05/20 [id number]    Clinical Impression Statement  Today's session focused on high level balance activities progressing to completing balance on bosu. Patient requiring intermittent CGA for steadying with today's balanace actvities. Also completed HEP review of isometric hip abduction/adduction due to patient not reporting completing these at home. Patient will continue to benefit from skilled PT services to address balance, strenght, and improve overall functional mobility.    Personal Factors and Comorbidities  Comorbidity 3+    Comorbidities  RA, DDD, osteoporosis, low back pain, HTN, SI joint pain, R malignant neoplasm of breast with radiation, metatarsalgiaUse LUE for blood pressure    Examination-Activity Limitations  Bend;Locomotion Level;Squat;Stairs;Stand    Examination-Participation Restrictions  Community Activity;Yard Work    Stability/Clinical Decision Making  Stable/Uncomplicated    Rehab Potential  Good    PT Frequency  2x / week    PT Duration  8 weeks    PT Treatment/Interventions  ADLs/Self Care Home Management;Aquatic Therapy;Cryotherapy;Moist Heat;Gait training;Stair training;Functional mobility training;Therapeutic activities;Therapeutic exercise;Balance training;Neuromuscular re-education;Patient/family education;Manual techniques;Passive range of motion;Dry needling    PT Next Visit Plan  RUE RESTRICTED FOR BP; PT HAS RA SO GENTLE STRENGTHENING AND ROM.  Continue static and dynamic balance activities. SLS and Tandem on Complaint surfaces. How is new HEP addition?    Consulted and Agree with Plan of Care  Patient        Patient will benefit from skilled therapeutic intervention in order to improve the following deficits and impairments:  Decreased balance, Decreased range of motion, Decreased strength, Difficulty walking, Pain  Visit Diagnosis: Unsteadiness on feet  Muscle weakness (generalized)  Pain in right hip  Difficulty in walking, not elsewhere classified     Problem List Patient Active Problem List   Diagnosis Date Noted  . Pruritus of scalp 06/20/2018  . DDD (degenerative disc disease), lumbar 05/27/2018  . Essential hypertension 03/19/2017  . History of pityriasis rosea 03/19/2017  . 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 History of malignant neoplasm of breast 03/19/2017  . Eczema 03/05/2017  . Vulvar dermatitis 03/05/2017  . Rheumatoid arthritis  10/15/2016  . High risk medication use 10/15/2016  . 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 S/P lumpectomy of breast 12/03/2011  . 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[place]) 11/01/2011    Jones Bales, PT, DPT 04/05/2020, 9:40 AM  [facility] [address] [place], [place], [address] Phone: [phone]   Fax:  [phone]  Name: BLODWEN BUERGER MRN: [id number] Date of Birth: 05-Dec-1945

## 2020-04-06 ENCOUNTER — Ambulatory Visit: Payer: Medicare PPO | Admitting: Physical Therapy

## 2020-04-06 DIAGNOSIS — R262 Difficulty in walking, not elsewhere classified: Secondary | ICD-10-CM

## 2020-04-06 DIAGNOSIS — M6281 Muscle weakness (generalized): Secondary | ICD-10-CM | POA: Diagnosis not present

## 2020-04-06 DIAGNOSIS — R2681 Unsteadiness on feet: Secondary | ICD-10-CM | POA: Diagnosis not present

## 2020-04-06 DIAGNOSIS — M25551 Pain in right hip: Secondary | ICD-10-CM | POA: Diagnosis not present

## 2020-04-07 ENCOUNTER — Ambulatory Visit: Payer: Medicare PPO | Admitting: Physical Therapy

## 2020-04-08 ENCOUNTER — Encounter: Payer: Self-pay | Admitting: Physical Therapy

## 2020-04-08 NOTE — Therapy (Signed)
[facility] [address] [place], [place], [address] Phone: [phone]   Fax:  [phone]  Physical Therapy Treatment  Patient Details  Name: Melissa Perez MRN: [id number] Date of Birth: 03-31-1946 Referring Provider (PT): Elyn Peers, MD   Encounter Date: 04/06/2020  PT End of Session - 04/08/20 1353    Visit Number  7    Number of Visits  17    Date for PT Re-Evaluation  05/16/20    Authorization Type  [employer]; $20 copay; 10th visit PN    Progress Note Due on Visit  10    PT Start Time  1330    PT Stop Time  1415    PT Time Calculation (min)  45 min    Equipment Utilized During Treatment  Other (comment)   pool noodle   Activity Tolerance  Patient tolerated treatment well    Behavior During Therapy  [facility] for tasks assessed/performed       Past Medical History:  Diagnosis Date  . Arthritis    rheumatoid, take Humira  . Arthritis    rheumatoid  . Arthritis pain   . Back pain   . Breast cancer ([place])   . Cancer ([place])   . History of dental surgery   . Hyperlipidemia   . Hypertension   . Personal history of radiation therapy   . Rash   . Rheumatoid arthritis ([place])   . S/P lumpectomy of breast 12/03/2011    Past Surgical History:  Procedure Laterality Date  . ABDOMINAL HYSTERECTOMY    . APPENDECTOMY    . BREAST EXCISIONAL BIOPSY Left 1991   benign  . BREAST LUMPECTOMY Right 11/2011  . BREAST SURGERY     2 left breast biopsies-benign  . left breast biopsies  1991 & 1990    There were no vitals filed for this visit.  Subjective Assessment - 04/08/20 1352    Subjective  Pt denies any falls or changes since last session.  Reports she does not swim and is a little fearful of her first aquatic session.    Pertinent History  RA, DDD, osteoporosis, low back pain, HTN, SI joint pain, R malignant neoplasm of breast with radiation, metatarsalgiaUse LUE for blood pressure    Limitations   Standing;Walking    How long can you walk comfortably?  40-50 minutes or 2 miles, 5 per week    Patient Stated Goals  To find out what is causing the staggering, doesn't want to fall    Currently in Pain?  No/denies       Aquatic therapy at [facility] - pool temp 86.7degrees  Patient seen for aquatic therapy today. Treatment took place in water 3.5-4 feet deep depending upon activity. Pt entered and exited the pool viaramp withsupervision:   Runners stretch bil LE's x 30 seconds each and toes up wall x 30 sec  Pt performed gait training in pool forwards 61m x 4 repswith pool noodle for support with PTA assisting, 51m x 2 reps backwards with pool noodle, 20 m forwards x 2 reps without noodle, 57m x 2 marching, 67m x 2 repsside stepping without pool noodle.  Performedbil LE marching, hip extension, hip abd, hamstring curl. Pt able to perform 10reps each sidethen 5 additional reps alternating sides. Heel raises x 15.Pt using pool edge for UE support.    Squats while standing at pool edge for UE support x 10 reps.  Ai Chi postures of enclosing, soothing x 10  reps each in slightly squated posture due to h/o knee pain.   SLS x 10 sec x 3 rep each side.  Tandem stance x 10 sec 3 reps each direction. Tandem gait at pool railing x 58m x 2 reps.  Stance with eyes closed 10 sec x 3 reps and head turns/nods x 10 reps feet together eyes open.  Pt requires buoyancy for support with balance and viscosity for resistance for strengthening exercises; buoyancy is also needed for off loading body to assist with exercises.  Pt reports increased comfort in pool as session progressed and feels like she was able to perform activities that she could not do on land.     PT Short Term Goals - 03/17/20 0907      PT SHORT TERM GOAL #1   Title  Pt will initiate and demonstrate independence with HEP    Time  4    Period  Weeks    Status  New    Target Date  04/16/20      PT SHORT TERM  GOAL #2   Title  Pt will initiate aquatic therapy    Time  4    Period  Weeks    Status  New    Target Date  04/16/20      PT SHORT TERM GOAL #3   Title  Pt will demonstrate 2 point improvement in FGA to indicate decreased falls risk    Baseline  21/30    Time  4    Period  Weeks    Status  New    Target Date  04/16/20      PT SHORT TERM GOAL #4   Title  Pt will report 25% reduction in lumbar, SI and R hip pain    Time  4    Period  Weeks    Status  New    Target Date  04/16/20        PT Long Term Goals - 03/17/20 0909      PT LONG TERM GOAL #1   Title  Pt will demonstrate independence with final land and aquatic HEP    Time  8    Period  Weeks    Status  New    Target Date  05/16/20      PT LONG TERM GOAL #2   Title  Pt will demonstrate decreased falls risk as indicated by increase in FGA to >/= 25/30    Time  8    Period  Weeks    Status  New    Target Date  05/16/20      PT LONG TERM GOAL #3   Title  Pt will report 50% reduction in low back, R hip and SI joint pain    Time  8    Period  Weeks    Status  New    Target Date  05/16/20      PT LONG TERM GOAL #4   Title  Pt will report being able to perform her daily walks without using golf club as AD for stability    Time  8    Period  Weeks    Status  New    Target Date  05/16/20      PT LONG TERM GOAL #5   Title  Pt will demonstrate ability to safely use gardening bench to decrease strain on low back and pelvis    Time  8    Period  Weeks  Status  New    Target Date  05/16/20            Plan - 04/08/20 1354    Clinical Impression Statement  Pt tolerated first aquatic session well.  Did have some c/o knee discomfort with Ai Chi posture so did not maintain the squat position long.  Cont PT per POC    Personal Factors and Comorbidities  Comorbidity 3+    Comorbidities  RA, DDD, osteoporosis, low back pain, HTN, SI joint pain, R malignant neoplasm of breast with radiation, metatarsalgiaUse LUE  for blood pressure    Examination-Activity Limitations  Bend;Locomotion Level;Squat;Stairs;Stand    Examination-Participation Restrictions  Community Activity;Yard Work    Stability/Clinical Decision Making  Stable/Uncomplicated    Rehab Potential  Good    PT Frequency  2x / week    PT Duration  8 weeks    PT Treatment/Interventions  ADLs/Self Care Home Management;Aquatic Therapy;Cryotherapy;Moist Heat;Gait training;Stair training;Functional mobility training;Therapeutic activities;Therapeutic exercise;Balance training;Neuromuscular re-education;Patient/family education;Manual techniques;Passive range of motion;Dry needling    PT Next Visit Plan  RUE RESTRICTED FOR BP; PT HAS RA SO GENTLE STRENGTHENING AND ROM.  Continue static and dynamic balance activities. SLS and Tandem on Complaint surfaces. How is new HEP addition?    Consulted and Agree with Plan of Care  Patient       Patient will benefit from skilled therapeutic intervention in order to improve the following deficits and impairments:  Decreased balance, Decreased range of motion, Decreased strength, Difficulty walking, Pain  Visit Diagnosis: Unsteadiness on feet  Muscle weakness (generalized)  Difficulty in walking, not elsewhere classified     Problem List Patient Active Problem List   Diagnosis Date Noted  . Pruritus of scalp 06/20/2018  . DDD (degenerative disc disease), lumbar 05/27/2018  . Essential hypertension 03/19/2017  . History of pityriasis rosea 03/19/2017  . 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 History of malignant neoplasm of breast 03/19/2017  . Eczema 03/05/2017  . Vulvar dermatitis 03/05/2017  . Rheumatoid arthritis  10/15/2016  . High risk medication use 10/15/2016  . 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 S/P lumpectomy of breast 12/03/2011  . 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

## 2020-04-13 ENCOUNTER — Ambulatory Visit: Payer: Medicare PPO | Admitting: Physical Therapy

## 2020-04-13 ENCOUNTER — Encounter: Payer: Self-pay | Admitting: Physical Therapy

## 2020-04-13 ENCOUNTER — Other Ambulatory Visit: Payer: Self-pay

## 2020-04-13 DIAGNOSIS — M6281 Muscle weakness (generalized): Secondary | ICD-10-CM

## 2020-04-13 DIAGNOSIS — R262 Difficulty in walking, not elsewhere classified: Secondary | ICD-10-CM | POA: Diagnosis not present

## 2020-04-13 DIAGNOSIS — R2681 Unsteadiness on feet: Secondary | ICD-10-CM

## 2020-04-13 DIAGNOSIS — M25551 Pain in right hip: Secondary | ICD-10-CM | POA: Diagnosis not present

## 2020-04-13 NOTE — Therapy (Signed)
[facility] [address] [place], [place], [address] Phone: [phone]   Fax:  [phone]  Physical Therapy Treatment  Patient Details  Name: Melissa Perez MRN: [id number] Date of Birth: 1946/10/20 Referring Provider (PT): Elyn Peers, MD   Encounter Date: 04/13/2020  PT End of Session - 04/13/20 1541    Visit Number  8    Number of Visits  17    Date for PT Re-Evaluation  05/16/20    Authorization Type  [employer]; $20 copay; 10th visit PN    Progress Note Due on Visit  10    PT Start Time  1320    PT Stop Time  1400    PT Time Calculation (min)  40 min    Equipment Utilized During Treatment  Other (comment)   buoyancy cuffs   Activity Tolerance  Patient tolerated treatment well    Behavior During Therapy  [facility] for tasks assessed/performed       Past Medical History:  Diagnosis Date  . Arthritis    rheumatoid, take Humira  . Arthritis    rheumatoid  . Arthritis pain   . Back pain   . Breast cancer ([place])   . Cancer ([place])   . History of dental surgery   . Hyperlipidemia   . Hypertension   . Personal history of radiation therapy   . Rash   . Rheumatoid arthritis ([place])   . S/P lumpectomy of breast 12/03/2011    Past Surgical History:  Procedure Laterality Date  . ABDOMINAL HYSTERECTOMY    . APPENDECTOMY    . BREAST EXCISIONAL BIOPSY Left 1991   benign  . BREAST LUMPECTOMY Right 11/2011  . BREAST SURGERY     2 left breast biopsies-benign  . left breast biopsies  1991 & 1990    There were no vitals filed for this visit.  Subjective Assessment - 04/13/20 1540    Subjective  Denies any falls or changes.  Was a little "sore" after last session but has also been working in the yard.    Pertinent History  RA, DDD, osteoporosis, low back pain, HTN, SI joint pain, R malignant neoplasm of breast with radiation, metatarsalgiaUse LUE for blood pressure    Limitations  Standing;Walking    How long  can you walk comfortably?  40-50 minutes or 2 miles, 5 per week    Patient Stated Goals  To find out what is causing the staggering, doesn't want to fall    Currently in Pain?  No/denies       Aquatic therapy at [facility] - pool temp 86.7degrees  Patient seen for aquatic therapy today. Treatment took place in water 3.5-4 feet deep depending upon activity. Pt entered and exited the pool viaramp withsupervision:   Runners stretch bil LE's x 30 seconds eachand toes up wall x 30 sec  Pt performed gait training in pool forwards 71m x 4 reps working on Medical illustrator for armswing, 5m x 2 reps backwards, 46m x 2 repsside stepping, 31m x 2 performing side step squat with rail in front for intermittent UE support  Performedbil LE marching, hip extension, hip abd, hamstring curl, SLR with buoyancy cuffs. Pt able to perform 15reps alternating sides. Heel raises x 15.Pt using pool edge for UE support.    Gait training 76m x 2 with buoyancy cuffs in place for increased challendge.  Pt requires buoyancy for support with balance and viscosity for resistance for strengthening exercises; buoyancy is also  needed for off loading body to assist with exercises.    PT Short Term Goals - 03/17/20 0907      PT SHORT TERM GOAL #1   Title  Pt will initiate and demonstrate independence with HEP    Time  4    Period  Weeks    Status  New    Target Date  04/16/20      PT SHORT TERM GOAL #2   Title  Pt will initiate aquatic therapy    Time  4    Period  Weeks    Status  New    Target Date  04/16/20      PT SHORT TERM GOAL #3   Title  Pt will demonstrate 2 point improvement in FGA to indicate decreased falls risk    Baseline  21/30    Time  4    Period  Weeks    Status  New    Target Date  04/16/20      PT SHORT TERM GOAL #4   Title  Pt will report 25% reduction in lumbar, SI and R hip pain    Time  4    Period  Weeks    Status  New    Target Date  04/16/20        PT Long  Term Goals - 03/17/20 0909      PT LONG TERM GOAL #1   Title  Pt will demonstrate independence with final land and aquatic HEP    Time  8    Period  Weeks    Status  New    Target Date  05/16/20      PT LONG TERM GOAL #2   Title  Pt will demonstrate decreased falls risk as indicated by increase in FGA to >/= 25/30    Time  8    Period  Weeks    Status  New    Target Date  05/16/20      PT LONG TERM GOAL #3   Title  Pt will report 50% reduction in low back, R hip and SI joint pain    Time  8    Period  Weeks    Status  New    Target Date  05/16/20      PT LONG TERM GOAL #4   Title  Pt will report being able to perform her daily walks without using golf club as AD for stability    Time  8    Period  Weeks    Status  New    Target Date  05/16/20      PT LONG TERM GOAL #5   Title  Pt will demonstrate ability to safely use gardening bench to decrease strain on low back and pelvis    Time  8    Period  Weeks    Status  New    Target Date  05/16/20            Plan - 04/13/20 1542    Clinical Impression Statement  Pt able to ambulate without pool noodle today with improved balance and decreased fear.  Tolerated buoyancy cuffs as well.  Continues to be challenged in aquatic session.  Per Misty Stanley, primary PT, plan to schedule pt for 3 additional aquatic sessions.    Personal Factors and Comorbidities  Comorbidity 3+    Comorbidities  RA, DDD, osteoporosis, low back pain, HTN, SI joint pain, R malignant neoplasm of breast with radiation,  metatarsalgiaUse LUE for blood pressure    Examination-Activity Limitations  Bend;Locomotion Level;Squat;Stairs;Stand    Examination-Participation Restrictions  Community Activity;Yard Work    Stability/Clinical Decision Making  Stable/Uncomplicated    Rehab Potential  Good    PT Frequency  2x / week    PT Duration  8 weeks    PT Treatment/Interventions  ADLs/Self Care Home Management;Aquatic Therapy;Cryotherapy;Moist Heat;Gait  training;Stair training;Functional mobility training;Therapeutic activities;Therapeutic exercise;Balance training;Neuromuscular re-education;Patient/family education;Manual techniques;Passive range of motion;Dry needling    PT Next Visit Plan  RUE RESTRICTED FOR BP; PT HAS RA SO GENTLE STRENGTHENING AND ROM.  Continue static and dynamic balance activities. SLS and Tandem on Complaint surfaces. How is new HEP addition?    Consulted and Agree with Plan of Care  Patient       Patient will benefit from skilled therapeutic intervention in order to improve the following deficits and impairments:  Decreased balance, Decreased range of motion, Decreased strength, Difficulty walking, Pain  Visit Diagnosis: Unsteadiness on feet  Muscle weakness (generalized)  Difficulty in walking, not elsewhere classified     Problem List Patient Active Problem List   Diagnosis Date Noted  . Pruritus of scalp 06/20/2018  . DDD (degenerative disc disease), lumbar 05/27/2018  . Essential hypertension 03/19/2017  . History of pityriasis rosea 03/19/2017  . 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 History of malignant neoplasm of breast 03/19/2017  . Eczema 03/05/2017  . Vulvar dermatitis 03/05/2017  . Rheumatoid arthritis  10/15/2016  . High risk medication use 10/15/2016  . 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 S/P lumpectomy of breast 12/03/2011  . 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[place]) 11/01/2011    Narda Bonds, PTA [facility] 04/13/20 3:48 PM Phone: [phone] Fax: [facility] [address] [place], [place], [address] Phone: [phone]   Fax:  [phone]  Name: Melissa Perez MRN: [id number] Date of Birth: [birth year]

## 2020-04-15 ENCOUNTER — Other Ambulatory Visit: Payer: Self-pay

## 2020-04-15 ENCOUNTER — Ambulatory Visit: Payer: Medicare PPO

## 2020-04-15 DIAGNOSIS — M25551 Pain in right hip: Secondary | ICD-10-CM | POA: Diagnosis not present

## 2020-04-15 DIAGNOSIS — R262 Difficulty in walking, not elsewhere classified: Secondary | ICD-10-CM

## 2020-04-15 DIAGNOSIS — R2681 Unsteadiness on feet: Secondary | ICD-10-CM

## 2020-04-15 DIAGNOSIS — M6281 Muscle weakness (generalized): Secondary | ICD-10-CM

## 2020-04-15 NOTE — Therapy (Signed)
[facility] [address] [place], [place], [address] Phone: [phone]   Fax:  [phone]  Physical Therapy Treatment  Patient Details  Name: Melissa Perez MRN: [id number] Date of Birth: 1946-01-24 Referring Provider (PT): Elyn Peers, MD   Encounter Date: 04/15/2020  PT End of Session - 04/15/20 0804    Visit Number  9    Number of Visits  17    Date for PT Re-Evaluation  05/16/20    Authorization Type  [employer]; $20 copay; 10th visit PN    Progress Note Due on Visit  10    PT Start Time  0800    PT Stop Time  0844    PT Time Calculation (min)  44 min    Equipment Utilized During Treatment  Other (comment);Gait belt   buoyancy cuffs   Activity Tolerance  Patient tolerated treatment well    Behavior During Therapy  [facility] for tasks assessed/performed       Past Medical History:  Diagnosis Date  . Arthritis    rheumatoid, take Humira  . Arthritis    rheumatoid  . Arthritis pain   . Back pain   . Breast cancer ([place])   . Cancer ([place])   . History of dental surgery   . Hyperlipidemia   . Hypertension   . Personal history of radiation therapy   . Rash   . Rheumatoid arthritis ([place])   . S/P lumpectomy of breast 12/03/2011    Past Surgical History:  Procedure Laterality Date  . ABDOMINAL HYSTERECTOMY    . APPENDECTOMY    . BREAST EXCISIONAL BIOPSY Left 1991   benign  . BREAST LUMPECTOMY Right 11/2011  . BREAST SURGERY     2 left breast biopsies-benign  . left breast biopsies  1991 & 1990    There were no vitals filed for this visit.  Subjective Assessment - 04/15/20 0802    Subjective  Reports that aquatic therapy is going well, soresness is dimished. Still walking in the morning. No falls, reports she feels like the staggering is getting better, not doing it as frequently.    Pertinent History  RA, DDD, osteoporosis, low back pain, HTN, SI joint pain, R malignant neoplasm of breast with  radiation, metatarsalgiaUse LUE for blood pressure    Limitations  Standing;Walking    How long can you walk comfortably?  40-50 minutes or 2 miles, 5 per week    Patient Stated Goals  To find out what is causing the staggering, doesn't want to fall    Currently in Pain?  No/denies         [facility] PT Assessment - 04/15/20 0810      Functional Gait  Assessment   Gait assessed   Yes    Gait Level Surface  Walks 20 ft in less than 7 sec but greater than 5.5 sec, uses assistive device, slower speed, mild gait deviations, or deviates 6-10 in outside of the 12 in walkway width.    Change in Gait Speed  Able to smoothly change walking speed without loss of balance or gait deviation. Deviate no more than 6 in outside of the 12 in walkway width.    Gait with Horizontal Head Turns  Performs head turns smoothly with no change in gait. Deviates no more than 6 in outside 12 in walkway width    Gait with Vertical Head Turns  Performs head turns with no change in gait. Deviates no more than  6 in outside 12 in walkway width.    Gait and Pivot Turn  Pivot turns safely in greater than 3 sec and stops with no loss of balance, or pivot turns safely within 3 sec and stops with mild imbalance, requires small steps to catch balance.    Step Over Obstacle  Is able to step over 2 stacked shoe boxes taped together (9 in total height) without changing gait speed. No evidence of imbalance.    Gait with Narrow Base of Support  Ambulates 7-9 steps.    Gait with Eyes Closed  Walks 20 ft, uses assistive device, slower speed, mild gait deviations, deviates 6-10 in outside 12 in walkway width. Ambulates 20 ft in less than 9 sec but greater than 7 sec.    Ambulating Backwards  Walks 20 ft, uses assistive device, slower speed, mild gait deviations, deviates 6-10 in outside 12 in walkway width.    Steps  Alternating feet, no rail.    Total Score  25    FGA comment:  25/30                    [facility] Adult PT  Treatment/Exercise - 04/15/20 0843      Ambulation/Gait   Ambulation/Gait  Yes    Ambulation/Gait Assistance  6: Modified independent (Device/Increase time)    Ambulation Distance (Feet)  200 Feet    Assistive device  None    Gait Pattern  Step-through pattern;Antalgic    Ambulation Surface  Level;Indoor      High Level Balance   High Level Balance Activities  Marching forwards    High Level Balance Comments  completed forward march, 25' x 2 reps.           Balance Exercises - 04/15/20 0841      Balance Exercises: Standing   SLS  Eyes open;Solid surface;30 secs;2 reps    Other Standing Exercises  Progressed SLS to on balance beam, 3 x 30 secs each w/o UE support CGA from PT. Completed balance actvities on bosu ball (flat side up) with focus on holding steady x 1 mins with EO no UE support., progressed to EC 2 x 1 min each. Completed forward step ups on bosu with opposite knee drive to further promote SLS and balance challenge, 2 x 10 reps each alternating leading lower extremity.         PT Education - 04/15/20 1035    Education Details  Educated on progress toward STG/LTGs    Person(s) Educated  Patient    Methods  Explanation    Comprehension  Verbalized understanding       PT Short Term Goals - 04/15/20 0805      PT SHORT TERM GOAL #1   Title  Pt will initiate and demonstrate independence with HEP    Baseline  Pt reports HEP going well, 1-2 exercises she does not do frequently.    Time  4    Period  Weeks    Status  On-going    Target Date  04/16/20      PT SHORT TERM GOAL #2   Title  Pt will initiate aquatic therapy    Baseline  Aquatic Therapy was initiated on 04/06/20    Time  4    Period  Weeks    Status  Achieved    Target Date  04/16/20      PT SHORT TERM GOAL #3   Title  Pt will demonstrate 2 point improvement in  FGA to indicate decreased falls risk    Baseline  21/30, 25/30 on 5/14    Time  4    Period  Weeks    Status  Achieved    Target Date   04/16/20      PT SHORT TERM GOAL #4   Title  Pt will report 25% reduction in lumbar, SI and R hip pain    Baseline  Pt reports 55% decrease in pain    Time  4    Period  Weeks    Status  Achieved    Target Date  04/16/20        PT Long Term Goals - 04/15/20 1036      PT LONG TERM GOAL #1   Title  Pt will demonstrate independence with final land and aquatic HEP    Time  8    Period  Weeks    Status  New      PT LONG TERM GOAL #2   Title  Pt will demonstrate decreased falls risk as indicated by increase in FGA to >/= 27/30    Time  8    Period  Weeks    Status  Revised      PT LONG TERM GOAL #3   Title  Pt will report 75% reduction in low back, R hip and SI joint pain    Time  8    Period  Weeks    Status  Revised      PT LONG TERM GOAL #4   Title  Pt will report being able to perform her daily walks without using golf club as AD for stability    Time  8    Period  Weeks    Status  New      PT LONG TERM GOAL #5   Title  Pt will demonstrate ability to safely use gardening bench to decrease strain on low back and pelvis    Time  8    Period  Weeks    Status  New            Plan - 04/15/20 1032    Clinical Impression Statement  Completed assessment of STG's in today's session, with pt achieving 3/4 STG's and demonstrating improved balance and reduced fall risk with score of 25/30 on FGA. Due to patient progress, LTGs revised. Continued high level balance actvities in today's session with patient tolerating well. Pt will continue to benefit from skilled PT services to progress toward all unmet LTG's    Personal Factors and Comorbidities  Comorbidity 3+    Comorbidities  RA, DDD, osteoporosis, low back pain, HTN, SI joint pain, R malignant neoplasm of breast with radiation, metatarsalgiaUse LUE for blood pressure    Examination-Activity Limitations  Bend;Locomotion Level;Squat;Stairs;Stand    Examination-Participation Restrictions  Community Activity;Yard Work     Stability/Clinical Decision Making  Stable/Uncomplicated    Rehab Potential  Good    PT Frequency  2x / week    PT Duration  8 weeks    PT Treatment/Interventions  ADLs/Self Care Home Management;Aquatic Therapy;Cryotherapy;Moist Heat;Gait training;Stair training;Functional mobility training;Therapeutic activities;Therapeutic exercise;Balance training;Neuromuscular re-education;Patient/family education;Manual techniques;Passive range of motion;Dry needling    PT Next Visit Plan  RUE RESTRICTED FOR BP; PT HAS RA SO GENTLE STRENGTHENING AND ROM.  SLS and Tandem on Complaint surfaces, High Level Balance Activities.    Consulted and Agree with Plan of Care  Patient       Patient will benefit from skilled therapeutic  intervention in order to improve the following deficits and impairments:  Decreased balance, Decreased range of motion, Decreased strength, Difficulty walking, Pain  Visit Diagnosis: Unsteadiness on feet  Muscle weakness (generalized)  Difficulty in walking, not elsewhere classified  Pain in right hip     Problem List Patient Active Problem List   Diagnosis Date Noted  . Pruritus of scalp 06/20/2018  . DDD (degenerative disc disease), lumbar 05/27/2018  . Essential hypertension 03/19/2017  . History of pityriasis rosea 03/19/2017  . 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 History of malignant neoplasm of breast 03/19/2017  . Eczema 03/05/2017  . Vulvar dermatitis 03/05/2017  . Rheumatoid arthritis  10/15/2016  . High risk medication use 10/15/2016  . 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 S/P lumpectomy of breast 12/03/2011  . 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 [place], [place], [address] Phone: [phone]   Fax:  [phone]  Name: CONNI BROUILLARD MRN: [id number] Date of Birth: [birth year]

## 2020-04-17 ENCOUNTER — Other Ambulatory Visit: Payer: Self-pay | Admitting: Rheumatology

## 2020-04-18 ENCOUNTER — Other Ambulatory Visit: Payer: Self-pay

## 2020-04-18 ENCOUNTER — Ambulatory Visit: Payer: Medicare PPO

## 2020-04-18 DIAGNOSIS — R2681 Unsteadiness on feet: Secondary | ICD-10-CM | POA: Diagnosis not present

## 2020-04-18 DIAGNOSIS — M6281 Muscle weakness (generalized): Secondary | ICD-10-CM

## 2020-04-18 DIAGNOSIS — M25551 Pain in right hip: Secondary | ICD-10-CM | POA: Diagnosis not present

## 2020-04-18 DIAGNOSIS — R262 Difficulty in walking, not elsewhere classified: Secondary | ICD-10-CM

## 2020-04-18 NOTE — Therapy (Signed)
[facility] [address] [place], [place], [address] Phone: [phone]   Fax:  [phone]  Physical Therapy Treatment/Progress Note  Patient Details  Name: Melissa Perez MRN: [id number] Date of Birth: 05-30-1946 Referring Provider (PT): Elyn Peers, MD  Physical Therapy Progress Note   Dates of Reporting Period: 03/17/2020 - 04/18/2020  See Note below for Objective Data and Assessment of Progress/Goals.  Thank you for the referral of this patient. Guillermina [place], PT, DPT   Encounter Date: 04/18/2020  PT End of Session - 04/18/20 0832    Visit Number  10    Number of Visits  17    Date for PT Re-Evaluation  05/16/20    Authorization Type  [employer]; $20 copay; 10th visit PN    Progress Note Due on Visit  10    PT Start Time  0829    PT Stop Time  0913    PT Time Calculation (min)  44 min    Equipment Utilized During Treatment  Other (comment);Gait belt   buoyancy cuffs   Activity Tolerance  Patient tolerated treatment well    Behavior During Therapy  WFL for tasks assessed/performed       Past Medical History:  Diagnosis Date  . Arthritis    rheumatoid, take Humira  . Arthritis    rheumatoid  . Arthritis pain   . Back pain   . Breast cancer ([place])   . Cancer ([place])   . History of dental surgery   . Hyperlipidemia   . Hypertension   . Personal history of radiation therapy   . Rash   . Rheumatoid arthritis ([place])   . S/P lumpectomy of breast 12/03/2011    Past Surgical History:  Procedure Laterality Date  . ABDOMINAL HYSTERECTOMY    . APPENDECTOMY    . BREAST EXCISIONAL BIOPSY Left 1991   benign  . BREAST LUMPECTOMY Right 11/2011  . BREAST SURGERY     2 left breast biopsies-benign  . left breast biopsies  1991 & 1990    There were no vitals filed for this visit.  Subjective Assessment - 04/18/20 0830    Subjective  Pt doing welll today, reports some soreness in that R knee, has been  present for about 10 days but reports getting better is not worsening. Pt walked this morning prior to therapy.    Pertinent History  RA, DDD, osteoporosis, low back pain, HTN, SI joint pain, R malignant neoplasm of breast with radiation, metatarsalgiaUse LUE for blood pressure    Limitations  Standing;Walking    How long can you walk comfortably?  40-50 minutes or 2 miles, 5 per week    Patient Stated Goals  To find out what is causing the staggering, doesn't want to fall    Currently in Pain?  No/denies                        [facility] Adult PT Treatment/Exercise - 04/18/20 [id number]      Ambulation/Gait   Ambulation/Gait  Yes    Ambulation/Gait Assistance  6: Modified independent (Device/Increase time)    Ambulation/Gait Assistance Details  throughout therapy gym during session    Ambulation Distance (Feet)  200 Feet    Assistive device  None    Gait Pattern  Step-through pattern;Antalgic    Ambulation Surface  Level;Indoor      High Level Balance   High Level Balance Activities  Negotiating over obstacles  High Level Balance Comments  completed gait activities with negotating over obstacles (cones) including stepover cones x 4 laps. Progressed to inlcude toe tap to cone iwth stepover to further promote SLS. Pt able to keep gait speed with completion. completed activities with agility ladder including: side stepping x 2 laps, side step with squat x 2 laps, forward stepping x 2 laps, and lateral back and forth shuffle x 2 laps.       Exercises   Exercises  Knee/Hip      Knee/Hip Exercises: Standing   Hip Extension  Stengthening;Both;2 sets;10 reps;Knee straight    Extension Limitations  with green theraband at ankles, verbal cues for form.     Other Standing Knee Exercises  Completed side stepping with green theraband, 20' x 3. Verbal cues to keep hips pointed forward for proper musculature recruitment. Educated on completing along hallway or countertop for steadying assist.   Mini squats on bosu ball (with ball portion up), using light UE support from // bars.       Knee/Hip Exercises: Supine   Bridges with Diona Foley Squeeze  Both;10 reps;1 set    Other Supine Knee/Hip Exercises  Completed hip adduction with ball, 2 x 10 reps, with 3 sec hold.  hooklying trunk rotations 1 x 15 reps, 10-15 sec hold for further stretch. verbal cues for engaging core musculature.              PT Education - 04/18/20 0910    Education Details  Educated on HEP Update (see patient instructions; new additions highlighted)    Person(s) Educated  Patient    Methods  Explanation;Demonstration;Handout    Comprehension  Verbalized understanding;Returned demonstration       PT Short Term Goals - 04/15/20 0805      PT SHORT TERM GOAL #1   Title  Pt will initiate and demonstrate independence with HEP    Baseline  Pt reports HEP going well, 1-2 exercises she does not do frequently.    Time  4    Period  Weeks    Status  On-going    Target Date  04/16/20      PT SHORT TERM GOAL #2   Title  Pt will initiate aquatic therapy    Baseline  Aquatic Therapy was initiated on 04/06/20    Time  4    Period  Weeks    Status  Achieved    Target Date  04/16/20      PT SHORT TERM GOAL #3   Title  Pt will demonstrate 2 point improvement in FGA to indicate decreased falls risk    Baseline  21/30, 25/30 on 5/14    Time  4    Period  Weeks    Status  Achieved    Target Date  04/16/20      PT SHORT TERM GOAL #4   Title  Pt will report 25% reduction in lumbar, SI and R hip pain    Baseline  Pt reports 55% decrease in pain    Time  4    Period  Weeks    Status  Achieved    Target Date  04/16/20        PT Long Term Goals - 04/15/20 1036      PT LONG TERM GOAL #1   Title  Pt will demonstrate independence with final land and aquatic HEP    Time  8    Period  Weeks    Status  New  PT LONG TERM GOAL #2   Title  Pt will demonstrate decreased falls risk as indicated by increase in FGA  to >/= 27/30    Time  8    Period  Weeks    Status  Revised      PT LONG TERM GOAL #3   Title  Pt will report 75% reduction in low back, R hip and SI joint pain    Time  8    Period  Weeks    Status  Revised      PT LONG TERM GOAL #4   Title  Pt will report being able to perform her daily walks without using golf club as AD for stability    Time  8    Period  Weeks    Status  New      PT LONG TERM GOAL #5   Title  Pt will demonstrate ability to safely use gardening bench to decrease strain on low back and pelvis    Time  8    Period  Weeks    Status  New            Plan - 04/18/20 0920    Clinical Impression Statement  Today's skilled PT session included progression of BLE strengthening actvities as tolerated by patient. Pt demonstrating improved strength with completion of activites, progressed side stepping to include green theraband and educated to complete at home. Continued high level balance activities with patient tolerating well. Patient continued to demosntrate good progress, and will continue to benefit from skilled PT services to progress toward all unmet goals.    Personal Factors and Comorbidities  Comorbidity 3+    Comorbidities  RA, DDD, osteoporosis, low back pain, HTN, SI joint pain, R malignant neoplasm of breast with radiation, metatarsalgiaUse LUE for blood pressure    Examination-Activity Limitations  Bend;Locomotion Level;Squat;Stairs;Stand    Examination-Participation Restrictions  Community Activity;Yard Work    Stability/Clinical Decision Making  Stable/Uncomplicated    Rehab Potential  Good    PT Frequency  2x / week    PT Duration  8 weeks    PT Treatment/Interventions  ADLs/Self Care Home Management;Aquatic Therapy;Cryotherapy;Moist Heat;Gait training;Stair training;Functional mobility training;Therapeutic activities;Therapeutic exercise;Balance training;Neuromuscular re-education;Patient/family education;Manual techniques;Passive range of  motion;Dry needling    PT Next Visit Plan  RUE RESTRICTED FOR BP; PT HAS RA SO GENTLE STRENGTHENING AND ROM.  Continue SLS and Tandem on Complaint surfaces, High Level Balance Activities. BLE strrengthening    Consulted and Agree with Plan of Care  Patient       Patient will benefit from skilled therapeutic intervention in order to improve the following deficits and impairments:  Decreased balance, Decreased range of motion, Decreased strength, Difficulty walking, Pain  Visit Diagnosis: Unsteadiness on feet  Muscle weakness (generalized)  Difficulty in walking, not elsewhere classified  Pain in right hip     Problem List Patient Active Problem List   Diagnosis Date Noted  . Pruritus of scalp 06/20/2018  . DDD (degenerative disc disease), lumbar 05/27/2018  . Essential hypertension 03/19/2017  . History of pityriasis rosea 03/19/2017  . 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 History of malignant neoplasm of breast 03/19/2017  . Eczema 03/05/2017  . Vulvar dermatitis 03/05/2017  . Rheumatoid arthritis  10/15/2016  . High risk medication use 10/15/2016  . 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 S/P lumpectomy of breast 12/03/2011  . 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 [place], [place], [address] Phone: [phone]   Fax:  [phone]  Name: PINEY LACHMAN MRN: [id number] Date of Birth: 1945/12/06

## 2020-04-18 NOTE — Telephone Encounter (Signed)
Ok to refill MTX

## 2020-04-18 NOTE — Patient Instructions (Signed)
Access Code: [email or web address] URL: [email or web address] Date: 03/29/2020 Prepared by: Baldomero Lamy  Exercises Supine Lower Trunk Rotation - 1 x daily - 7 x weekly - 1 sets - 10 reps Supine Figure 4 Piriformis Stretch - 1 x daily - 7 x weekly - 2 sets - 20 seconds hold Supine Hip Adduction Isometric with Ball - 1 x daily - 7 x weekly - 2 sets - 5 reps - 5 second hold Hooklying Isometric Hip Abduction with Belt - 1 x daily - 7 x weekly - 2 sets - 5 reps - 5 second hold Half Tandem Stance Balance with Head Rotation - 1 x daily - 7 x weekly - 2 sets - 10 reps Half Tandem Stance Balance with Head Nods - 1 x daily - 7 x weekly - 2 sets - 10 reps Narrow Base of Support Standing Balance Eyes Closed on Foam Pad - 1 x daily - 7 x weekly - 2 sets - 10 reps Tandem Walking with Counter Support - 1 x daily - 7 x weekly - 3 sets - 10 reps Side Stepping with Resistance at Thighs and Counter Support - 1 x daily - 7 x weekly - 3 sets - 10 reps (instructed to complete with green theraband)  Supine Lower Trunk Rotation - 1 x daily - 7 x weekly - 3 sets - 10 reps

## 2020-04-18 NOTE — Telephone Encounter (Signed)
Last Visit: 02/24/2020 Next Visit: 07/25/2020 Labs: 03/31/2020 CMP WNL. WBC count is elevated-18.4. absolute neutrophils are elevated.  RBC count is low but stable.   Current Dose per office note 02/24/2020: methotrexate 7 tablets by mouth once weekly  Okay to refill MTX?

## 2020-04-20 ENCOUNTER — Ambulatory Visit: Payer: Medicare PPO | Admitting: Physical Therapy

## 2020-04-20 ENCOUNTER — Other Ambulatory Visit: Payer: Self-pay

## 2020-04-20 DIAGNOSIS — R2681 Unsteadiness on feet: Secondary | ICD-10-CM | POA: Diagnosis not present

## 2020-04-20 DIAGNOSIS — M25551 Pain in right hip: Secondary | ICD-10-CM | POA: Diagnosis not present

## 2020-04-20 DIAGNOSIS — R262 Difficulty in walking, not elsewhere classified: Secondary | ICD-10-CM

## 2020-04-20 DIAGNOSIS — M6281 Muscle weakness (generalized): Secondary | ICD-10-CM | POA: Diagnosis not present

## 2020-04-22 ENCOUNTER — Encounter: Payer: Self-pay | Admitting: Physical Therapy

## 2020-04-22 NOTE — Therapy (Signed)
[facility] [address] [place], [place], [address] Phone: [phone]   Fax:  [phone]  Physical Therapy Treatment  Patient Details  Name: Melissa Perez MRN: [id number] Date of Birth: 01-08-1946 Referring Provider (PT): Elyn Peers, MD   Encounter Date: 04/20/2020  PT End of Session - 04/22/20 1022    Visit Number  11    Number of Visits  17    Date for PT Re-Evaluation  05/16/20    Authorization Type  [employer]; $20 copay; 10th visit PN    Progress Note Due on Visit  10    PT Start Time  1500    PT Stop Time  1545    PT Time Calculation (min)  45 min    Equipment Utilized During Treatment  Other (comment)   buoyancy cuffs, hand bar bells   Activity Tolerance  Patient tolerated treatment well    Behavior During Therapy  [facility] for tasks assessed/performed       Past Medical History:  Diagnosis Date  . Arthritis    rheumatoid, take Humira  . Arthritis    rheumatoid  . Arthritis pain   . Back pain   . Breast cancer ([place])   . Cancer ([place])   . History of dental surgery   . Hyperlipidemia   . Hypertension   . Personal history of radiation therapy   . Rash   . Rheumatoid arthritis ([place])   . S/P lumpectomy of breast 12/03/2011    Past Surgical History:  Procedure Laterality Date  . ABDOMINAL HYSTERECTOMY    . APPENDECTOMY    . BREAST EXCISIONAL BIOPSY Left 1991   benign  . BREAST LUMPECTOMY Right 11/2011  . BREAST SURGERY     2 left breast biopsies-benign  . left breast biopsies  1991 & 1990    There were no vitals filed for this visit.  Subjective Assessment - 04/22/20 1021    Subjective  "Why is it the water makes my knees feel better?".  Pt continues to report improvement in mobility and balance with aquatic sessions.    Pertinent History  RA, DDD, osteoporosis, low back pain, HTN, SI joint pain, R malignant neoplasm of breast with radiation, metatarsalgiaUse LUE for blood pressure     Limitations  Standing;Walking    How long can you walk comfortably?  40-50 minutes or 2 miles, 5 per week    Patient Stated Goals  To find out what is causing the staggering, doesn't want to fall    Currently in Pain?  No/denies       Aquatic therapy at [facility] - pool temp 86.7degrees  Patient seen for aquatic therapy today. Treatment took place in water 3.5-4 feet deep depending upon activity. Pt entered and exited the pool viaramp withsupervision:   Runners stretch bil LE's x 30 seconds eachand toes up wall x 30 sec  Pt performed gait training in pool forwards 29m x4reps working on hands underwater for armswing, 89mx 2 reps backwards,85m x 2 repsside stepping, 8m x 2 performing side step squat with hand bar bells.  Performedbil LE marching, hip extension, hip abd, hamstring curl, SLR with buoyancy cuffs. Pt able to perform 15reps alternating sides. Heel raises x 15.Pt using pool edge for UE support.  Facing away from pool edge and performed bil UE shoulder flexion/extension, shoulder abd/add both x 15 reps each.  Ai Chi posture of enclosing and soothing in semi squat positon x 15 reps.  Tandem gait  x 15 m with rail for intermittent support.  Balance activities with EO and EC, narrow and normal BOS, head turns and nods.  Squats x 10 at pool edge with UE support.  Pt requires buoyancy for support with balance and viscosity for resistance for strengthening exercises; buoyancy is also needed for off loading body to assist with exercises.    PT Short Term Goals - 04/15/20 0805      PT SHORT TERM GOAL #1   Title  Pt will initiate and demonstrate independence with HEP    Baseline  Pt reports HEP going well, 1-2 exercises she does not do frequently.    Time  4    Period  Weeks    Status  On-going    Target Date  04/16/20      PT SHORT TERM GOAL #2   Title  Pt will initiate aquatic therapy    Baseline  Aquatic Therapy was initiated on 04/06/20    Time  4     Period  Weeks    Status  Achieved    Target Date  04/16/20      PT SHORT TERM GOAL #3   Title  Pt will demonstrate 2 point improvement in FGA to indicate decreased falls risk    Baseline  21/30, 25/30 on 5/14    Time  4    Period  Weeks    Status  Achieved    Target Date  04/16/20      PT SHORT TERM GOAL #4   Title  Pt will report 25% reduction in lumbar, SI and R hip pain    Baseline  Pt reports 55% decrease in pain    Time  4    Period  Weeks    Status  Achieved    Target Date  04/16/20        PT Long Term Goals - 04/15/20 1036      PT LONG TERM GOAL #1   Title  Pt will demonstrate independence with final land and aquatic HEP    Time  8    Period  Weeks    Status  New      PT LONG TERM GOAL #2   Title  Pt will demonstrate decreased falls risk as indicated by increase in FGA to >/= 27/30    Time  8    Period  Weeks    Status  Revised      PT LONG TERM GOAL #3   Title  Pt will report 75% reduction in low back, R hip and SI joint pain    Time  8    Period  Weeks    Status  Revised      PT LONG TERM GOAL #4   Title  Pt will report being able to perform her daily walks without using golf club as AD for stability    Time  8    Period  Weeks    Status  New      PT LONG TERM GOAL #5   Title  Pt will demonstrate ability to safely use gardening bench to decrease strain on low back and pelvis    Time  8    Period  Weeks    Status  New            Plan - 04/22/20 1023    Clinical Impression Statement  Pt's balance and confidence continue to improve with aquatic sessions along with endurance and strength.  Pt  with several bouts of lob in pool but recovers without assist. Cont per POC.    Personal Factors and Comorbidities  Comorbidity 3+    Comorbidities  RA, DDD, osteoporosis, low back pain, HTN, SI joint pain, R malignant neoplasm of breast with radiation, metatarsalgiaUse LUE for blood pressure    Examination-Activity Limitations  Bend;Locomotion  Level;Squat;Stairs;Stand    Examination-Participation Restrictions  Community Activity;Yard Work    Stability/Clinical Decision Making  Stable/Uncomplicated    Rehab Potential  Good    PT Frequency  2x / week    PT Duration  8 weeks    PT Treatment/Interventions  ADLs/Self Care Home Management;Aquatic Therapy;Cryotherapy;Moist Heat;Gait training;Stair training;Functional mobility training;Therapeutic activities;Therapeutic exercise;Balance training;Neuromuscular re-education;Patient/family education;Manual techniques;Passive range of motion;Dry needling    PT Next Visit Plan  RUE RESTRICTED FOR BP; PT HAS RA SO GENTLE STRENGTHENING AND ROM.  Continue SLS and Tandem on Complaint surfaces, High Level Balance Activities. BLE strrengthening    Consulted and Agree with Plan of Care  Patient       Patient will benefit from skilled therapeutic intervention in order to improve the following deficits and impairments:  Decreased balance, Decreased range of motion, Decreased strength, Difficulty walking, Pain  Visit Diagnosis: Unsteadiness on feet  Muscle weakness (generalized)  Difficulty in walking, not elsewhere classified     Problem List Patient Active Problem List   Diagnosis Date Noted  . Pruritus of scalp 06/20/2018  . DDD (degenerative disc disease), lumbar 05/27/2018  . Essential hypertension 03/19/2017  . History of pityriasis rosea 03/19/2017  . 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 History of malignant neoplasm of breast 03/19/2017  . Eczema 03/05/2017  . Vulvar dermatitis 03/05/2017  . Rheumatoid arthritis  10/15/2016  . High risk medication use 10/15/2016  . 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 S/P lumpectomy of breast 12/03/2011  . 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

## 2020-04-23 ENCOUNTER — Other Ambulatory Visit: Payer: Self-pay | Admitting: Rheumatology

## 2020-04-23 DIAGNOSIS — M0579 Rheumatoid arthritis with rheumatoid factor of multiple sites without organ or systems involvement: Secondary | ICD-10-CM

## 2020-04-25 ENCOUNTER — Other Ambulatory Visit: Payer: Self-pay

## 2020-04-25 DIAGNOSIS — Z79899 Other long term (current) drug therapy: Secondary | ICD-10-CM

## 2020-04-25 NOTE — Telephone Encounter (Signed)
Patient returned the call and has an appointment for labs on 04/29/2020. Order has been released for [facility].

## 2020-04-25 NOTE — Telephone Encounter (Signed)
Last Visit: 02/24/2020 Next Visit: 07/25/2020 Labs: 03/31/2020 CMP WNL. WBC count is elevated-18.4. absolute neutrophils are elevated.  RBC count is low but stable.  Please advise patient to return in 1 month to recheck CBC.  Eye exam: 02/11/2020   Attempted to contact patient and left message on machine to advise patient she is due to repeat CBC.   Okay to refill plaquenil?

## 2020-04-25 NOTE — Telephone Encounter (Signed)
ok 

## 2020-04-27 ENCOUNTER — Ambulatory Visit: Payer: Medicare PPO | Admitting: Physical Therapy

## 2020-04-27 ENCOUNTER — Ambulatory Visit: Payer: Medicare PPO

## 2020-04-27 ENCOUNTER — Other Ambulatory Visit: Payer: Self-pay

## 2020-04-27 ENCOUNTER — Other Ambulatory Visit: Payer: Self-pay | Admitting: Rheumatology

## 2020-04-27 DIAGNOSIS — M25551 Pain in right hip: Secondary | ICD-10-CM | POA: Diagnosis not present

## 2020-04-27 DIAGNOSIS — M6281 Muscle weakness (generalized): Secondary | ICD-10-CM | POA: Diagnosis not present

## 2020-04-27 DIAGNOSIS — R262 Difficulty in walking, not elsewhere classified: Secondary | ICD-10-CM

## 2020-04-27 DIAGNOSIS — R2681 Unsteadiness on feet: Secondary | ICD-10-CM

## 2020-04-27 NOTE — Telephone Encounter (Signed)
Spoke with patient and she states she is not in need of a prednisone taper at this time. Patient states the pharmacy sent thi prescription refill over in error.

## 2020-04-28 ENCOUNTER — Encounter: Payer: Self-pay | Admitting: Physical Therapy

## 2020-04-28 NOTE — Therapy (Signed)
[facility] [address] [place], [place], [address] Phone: [phone]   Fax:  [phone]  Physical Therapy Treatment  Patient Details  Name: Melissa Perez MRN: [id number] Date of Birth: June 18, 1946 Referring Provider (PT): Elyn Peers, MD   Encounter Date: 04/27/2020  PT End of Session - 04/28/20 1704    Visit Number  12    Number of Visits  17    Date for PT Re-Evaluation  05/16/20    Authorization Type  [employer]; $20 copay; 10th visit PN    Progress Note Due on Visit  10    PT Start Time  1500    PT Stop Time  1545    PT Time Calculation (min)  45 min    Equipment Utilized During Treatment  Other (comment)   buoyancy cuffs, hand bar bells   Activity Tolerance  Patient tolerated treatment well    Behavior During Therapy  [facility] for tasks assessed/performed       Past Medical History:  Diagnosis Date  . Arthritis    rheumatoid, take Humira  . Arthritis    rheumatoid  . Arthritis pain   . Back pain   . Breast cancer ([place])   . Cancer ([place])   . History of dental surgery   . Hyperlipidemia   . Hypertension   . Personal history of radiation therapy   . Rash   . Rheumatoid arthritis ([place])   . S/P lumpectomy of breast 12/03/2011    Past Surgical History:  Procedure Laterality Date  . ABDOMINAL HYSTERECTOMY    . APPENDECTOMY    . BREAST EXCISIONAL BIOPSY Left 1991   benign  . BREAST LUMPECTOMY Right 11/2011  . BREAST SURGERY     2 left breast biopsies-benign  . left breast biopsies  1991 & 1990    There were no vitals filed for this visit.  Subjective Assessment - 04/28/20 1703    Subjective  Pt reports that she can see a difference with her balance and knee pain/discomfort.    Pertinent History  RA, DDD, osteoporosis, low back pain, HTN, SI joint pain, R malignant neoplasm of breast with radiation, metatarsalgiaUse LUE for blood pressure    Limitations  Standing;Walking    How long can you  walk comfortably?  40-50 minutes or 2 miles, 5 per week    Patient Stated Goals  To find out what is causing the staggering, doesn't want to fall    Currently in Pain?  No/denies       Aquatic therapy at [facility] - pool temp 86.7degrees  Patient seen for aquatic therapy today. Treatment took place in water 3.5-4 feet deep depending upon activity. Pt entered and exited the pool viaramp withsupervision:   Runners stretch bil LE's x 30 seconds eachand toes up wall x 30 sec  Seated on pool bench for bil LE LAQ and hip flexion x 15 reps.  Pt challenged maintaining seated position due to buoyancy.   Sit<>stand x 10 reps from bench with min-supervision.  Needed cues for forward lean to prevent leaning back against bench with LE's.  Pt performed gait training in pool forwards 39m x4repsworking on hands underwater for armswing, 38mx 2 reps backwards,23m x 2 repsside stepping, 52m x 2 performing side step squat with hand bar bells.  Performedbil LE marching, hip extension, hip abd, hamstring curl, SLR with buoyancy cuffs. Pt able to perform 15repsalternating sides. Heel raises x 15.Pt using pool edge for UE support.  Facing away from pool edge and performed bil UE shoulder flexion/extension, shoulder abd/add both x 15 reps each.  Ai Chi posture of floating, enclosing, soothing and gathering in semi squat positon x 15 reps.  Pt tolerated this position better today.  Needed cues for trunk rotation with gathering.    Pt requires buoyancy for support with balance and viscosity for resistance for strengthening exercises; buoyancy is also needed for off loading body to assist with exercises.     PT Short Term Goals - 04/15/20 0805      PT SHORT TERM GOAL #1   Title  Pt will initiate and demonstrate independence with HEP    Baseline  Pt reports HEP going well, 1-2 exercises she does not do frequently.    Time  4    Period  Weeks    Status  On-going    Target Date   04/16/20      PT SHORT TERM GOAL #2   Title  Pt will initiate aquatic therapy    Baseline  Aquatic Therapy was initiated on 04/06/20    Time  4    Period  Weeks    Status  Achieved    Target Date  04/16/20      PT SHORT TERM GOAL #3   Title  Pt will demonstrate 2 point improvement in FGA to indicate decreased falls risk    Baseline  21/30, 25/30 on 5/14    Time  4    Period  Weeks    Status  Achieved    Target Date  04/16/20      PT SHORT TERM GOAL #4   Title  Pt will report 25% reduction in lumbar, SI and R hip pain    Baseline  Pt reports 55% decrease in pain    Time  4    Period  Weeks    Status  Achieved    Target Date  04/16/20        PT Long Term Goals - 04/15/20 1036      PT LONG TERM GOAL #1   Title  Pt will demonstrate independence with final land and aquatic HEP    Time  8    Period  Weeks    Status  New      PT LONG TERM GOAL #2   Title  Pt will demonstrate decreased falls risk as indicated by increase in FGA to >/= 27/30    Time  8    Period  Weeks    Status  Revised      PT LONG TERM GOAL #3   Title  Pt will report 75% reduction in low back, R hip and SI joint pain    Time  8    Period  Weeks    Status  Revised      PT LONG TERM GOAL #4   Title  Pt will report being able to perform her daily walks without using golf club as AD for stability    Time  8    Period  Weeks    Status  New      PT LONG TERM GOAL #5   Title  Pt will demonstrate ability to safely use gardening bench to decrease strain on low back and pelvis    Time  8    Period  Weeks    Status  New            Plan - 04/28/20 1704    Clinical  Impression Statement  Pt continues to improve with balance, strength and endurance with aquatic sessions.  Next aquatic session is her last aquatic session.  Will provide Aquatic HEP at next visit as pt plans on continuing aquatic exercise on her own upon d/c.    Personal Factors and Comorbidities  Comorbidity 3+    Comorbidities  RA, DDD,  osteoporosis, low back pain, HTN, SI joint pain, R malignant neoplasm of breast with radiation, metatarsalgiaUse LUE for blood pressure    Examination-Activity Limitations  Bend;Locomotion Level;Squat;Stairs;Stand    Examination-Participation Restrictions  Community Activity;Yard Work    Stability/Clinical Decision Making  Stable/Uncomplicated    Rehab Potential  Good    PT Frequency  2x / week    PT Duration  8 weeks    PT Treatment/Interventions  ADLs/Self Care Home Management;Aquatic Therapy;Cryotherapy;Moist Heat;Gait training;Stair training;Functional mobility training;Therapeutic activities;Therapeutic exercise;Balance training;Neuromuscular re-education;Patient/family education;Manual techniques;Passive range of motion;Dry needling    PT Next Visit Plan  RUE RESTRICTED FOR BP; PT HAS RA SO GENTLE STRENGTHENING AND ROM.  Continue SLS and Tandem on Complaint surfaces, High Level Balance Activities. BLE strrengthening    Consulted and Agree with Plan of Care  Patient       Patient will benefit from skilled therapeutic intervention in order to improve the following deficits and impairments:  Decreased balance, Decreased range of motion, Decreased strength, Difficulty walking, Pain  Visit Diagnosis: Unsteadiness on feet  Muscle weakness (generalized)  Difficulty in walking, not elsewhere classified     Problem List Patient Active Problem List   Diagnosis Date Noted  . Pruritus of scalp 06/20/2018  . DDD (degenerative disc disease), lumbar 05/27/2018  . Essential hypertension 03/19/2017  . History of pityriasis rosea 03/19/2017  . 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 History of malignant neoplasm of breast 03/19/2017  . Eczema 03/05/2017  . Vulvar dermatitis 03/05/2017  . Rheumatoid arthritis  10/15/2016  . High risk medication use 10/15/2016  . 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 S/P lumpectomy of breast 12/03/2011  . Primary cancer  of lower outer quadrant of right female breast ([place]) 11/01/2011    Narda Bonds, PTA [facility] 04/28/20 5:10 PM Phone: [phone] Fax: [facility] [address] [place], [place], [address] Phone: [phone]   Fax:  [phone]  Name: CHANNA STINNER MRN: [id number] Date of Birth: Aug 01, 1946

## 2020-04-29 ENCOUNTER — Ambulatory Visit: Payer: Medicare PPO | Admitting: Physical Therapy

## 2020-04-29 ENCOUNTER — Encounter: Payer: Self-pay | Admitting: Physical Therapy

## 2020-04-29 ENCOUNTER — Other Ambulatory Visit: Payer: Self-pay

## 2020-04-29 DIAGNOSIS — R262 Difficulty in walking, not elsewhere classified: Secondary | ICD-10-CM | POA: Diagnosis not present

## 2020-04-29 DIAGNOSIS — M6281 Muscle weakness (generalized): Secondary | ICD-10-CM | POA: Diagnosis not present

## 2020-04-29 DIAGNOSIS — R2681 Unsteadiness on feet: Secondary | ICD-10-CM | POA: Diagnosis not present

## 2020-04-29 DIAGNOSIS — M25551 Pain in right hip: Secondary | ICD-10-CM

## 2020-04-29 DIAGNOSIS — Z79899 Other long term (current) drug therapy: Secondary | ICD-10-CM | POA: Diagnosis not present

## 2020-04-29 LAB — CBC WITH DIFFERENTIAL/PLATELET
Absolute Monocytes: 475 cells/uL (ref 200–950)
Basophils Absolute: 59 cells/uL (ref 0–200)
Basophils Relative: 1.2 %
Eosinophils Absolute: 88 cells/uL (ref 15–500)
Eosinophils Relative: 1.8 %
HCT: 38 % (ref 35.0–45.0)
Hemoglobin: 12.9 g/dL (ref 11.7–15.5)
Lymphs Abs: 1499 cells/uL (ref 850–3900)
MCH: 35.5 pg — ABNORMAL HIGH (ref 27.0–33.0)
MCHC: 33.9 g/dL (ref 32.0–36.0)
MCV: 104.7 fL — ABNORMAL HIGH (ref 80.0–100.0)
MPV: 10 fL (ref 7.5–12.5)
Monocytes Relative: 9.7 %
Neutro Abs: 2778 cells/uL (ref 1500–7800)
Neutrophils Relative %: 56.7 %
Platelets: 224 10*3/uL (ref 140–400)
RBC: 3.63 10*6/uL — ABNORMAL LOW (ref 3.80–5.10)
RDW: 14.5 % (ref 11.0–15.0)
Total Lymphocyte: 30.6 %
WBC: 4.9 10*3/uL (ref 3.8–10.8)

## 2020-04-29 NOTE — Patient Instructions (Signed)
Access Code: [email or web address] URL: [email or web address] Date: 04/29/2020 Prepared by: Misty Stanley  Exercises Supine Lower Trunk Rotation - 1 x daily - 7 x weekly - 1 sets - 10 reps Narrow Base of Support Standing Balance Eyes Closed on Foam Pad - 1 x daily - 7 x weekly - 2 sets - 10 reps Tandem Walking with Counter Support - 1 x daily - 7 x weekly - 3 sets - 10 reps Side Stepping with Resistance at Thighs and Counter Support - 1 x daily - 7 x weekly - 3 sets - 10 reps Seated Figure 4 Piriformis Stretch - 1 x daily - 7 x weekly - 2 sets - 30 hold Half Tandem Stance with Head Rotation on Foam Pad - 1 x daily - 7 x weekly - 2 sets - 10 reps Half Tandem Stance with Head Nods on Foam Pad - 1 x daily - 7 x weekly - 2 sets - 10 reps Supine Bridge with Mini Swiss Ball Between Knees - 1 x daily - 7 x weekly - 2 sets - 10 reps Bridge with Abduction and Resistance Loop - 1 x daily - 7 x weekly - 2 sets - 10 reps

## 2020-04-29 NOTE — Therapy (Signed)
[facility] [address] [place], [place], [address] Phone: [phone]   Fax:  [phone]  Physical Therapy Treatment  Patient Details  Name: Melissa Perez MRN: [id number] Date of Birth: 27-Feb-1946 Referring Provider (PT): Elyn Peers, MD   Encounter Date: 04/29/2020  PT End of Session - 04/29/20 0935    Visit Number  13    Number of Visits  17    Date for PT Re-Evaluation  05/16/20    Authorization Type  [employer]; $20 copay; 10th visit PN    Progress Note Due on Visit  20    PT Start Time  0852    PT Stop Time  0930    PT Time Calculation (min)  38 min    Equipment Utilized During Treatment  --    Activity Tolerance  Patient tolerated treatment well    Behavior During Therapy  [facility] for tasks assessed/performed       Past Medical History:  Diagnosis Date  . Arthritis    rheumatoid, take Humira  . Arthritis    rheumatoid  . Arthritis pain   . Back pain   . Breast cancer ([place])   . Cancer ([place])   . History of dental surgery   . Hyperlipidemia   . Hypertension   . Personal history of radiation therapy   . Rash   . Rheumatoid arthritis ([place])   . S/P lumpectomy of breast 12/03/2011    Past Surgical History:  Procedure Laterality Date  . ABDOMINAL HYSTERECTOMY    . APPENDECTOMY    . BREAST EXCISIONAL BIOPSY Left 1991   benign  . BREAST LUMPECTOMY Right 11/2011  . BREAST SURGERY     2 left breast biopsies-benign  . left breast biopsies  1991 & 1990    There were no vitals filed for this visit.  Subjective Assessment - 04/29/20 0858    Subjective  Next pool session is her last, is looking into continuing aquatics when she finishes at the [facility].  Looking at [facility] pool.  Does feel a big difference in her balance and LE strength.  Also feels the benefit of the LE exercises.    Pertinent History  RA, DDD, osteoporosis, low back pain, HTN, SI joint pain, R malignant neoplasm of breast with  radiation, metatarsalgia, Use LUE for blood pressure    Limitations  Standing;Walking    How long can you walk comfortably?  40-50 minutes or 2 miles, 5 per week    Patient Stated Goals  To find out what is causing the staggering, doesn't want to fall    Currently in Pain?  No/denies                        [facility] Adult PT Treatment/Exercise - 04/29/20 [id number]      Knee/Hip Exercises: Supine   Bridges with Greig Right  Strengthening;Both;1 set;10 reps    Constance Haw with Clamshell  Strengthening;Both;1 set;10 reps   green theraband         Balance Exercises - 04/29/20 0916      Balance Exercises: Standing   Standing Eyes Opened  Narrow base of support (BOS);Head turns;Foam/compliant surface;Solid surface;Other reps (comment)   10 reps head nods/turns, half tandem   Standing Eyes Closed  Narrow base of support (BOS);Head turns;Foam/compliant surface;Other reps (comment)   10 reps      Access Code: [email or web address] URL: [email or web address] Date: 04/29/2020 Prepared by: Misty Stanley  Exercises Supine  Lower Trunk Rotation - 1 x daily - 7 x weekly - 1 sets - 10 reps Narrow Base of Support Standing Balance Eyes Closed on Foam Pad - 1 x daily - 7 x weekly - 2 sets - 10 reps Tandem Walking with Counter Support - 1 x daily - 7 x weekly - 3 sets - 10 reps Side Stepping with Resistance at Thighs and Counter Support - 1 x daily - 7 x weekly - 3 sets - 10 reps Seated Figure 4 Piriformis Stretch - 1 x daily - 7 x weekly - 2 sets - 30 hold Half Tandem Stance with Head Rotation on Foam Pad - 1 x daily - 7 x weekly - 2 sets - 10 reps Half Tandem Stance with Head Nods on Foam Pad - 1 x daily - 7 x weekly - 2 sets - 10 reps Supine Bridge with Mini Swiss Ball Between Knees - 1 x daily - 7 x weekly - 2 sets - 10 reps Bridge with Abduction and Resistance Loop - 1 x daily - 7 x weekly - 2 sets - 10 reps   PT Education - 04/29/20 0934    Education Details  updated HEP for land;  discussed how pt can laminate pool HEP and take to pool with her    Person(s) Educated  Patient    Methods  Explanation;Demonstration;Handout    Comprehension  Verbalized understanding;Returned demonstration       PT Short Term Goals - 04/15/20 0805      PT SHORT TERM GOAL #1   Title  Pt will initiate and demonstrate independence with HEP    Baseline  Pt reports HEP going well, 1-2 exercises she does not do frequently.    Time  4    Period  Weeks    Status  On-going    Target Date  04/16/20      PT SHORT TERM GOAL #2   Title  Pt will initiate aquatic therapy    Baseline  Aquatic Therapy was initiated on 04/06/20    Time  4    Period  Weeks    Status  Achieved    Target Date  04/16/20      PT SHORT TERM GOAL #3   Title  Pt will demonstrate 2 point improvement in FGA to indicate decreased falls risk    Baseline  21/30, 25/30 on 5/14    Time  4    Period  Weeks    Status  Achieved    Target Date  04/16/20      PT SHORT TERM GOAL #4   Title  Pt will report 25% reduction in lumbar, SI and R hip pain    Baseline  Pt reports 55% decrease in pain    Time  4    Period  Weeks    Status  Achieved    Target Date  04/16/20        PT Long Term Goals - 04/15/20 1036      PT LONG TERM GOAL #1   Title  Pt will demonstrate independence with final land and aquatic HEP    Time  8    Period  Weeks    Status  New      PT LONG TERM GOAL #2   Title  Pt will demonstrate decreased falls risk as indicated by increase in FGA to >/= 27/30    Time  8    Period  Weeks    Status  Revised      PT LONG TERM GOAL #3   Title  Pt will report 75% reduction in low back, R hip and SI joint pain    Time  8    Period  Weeks    Status  Revised      PT LONG TERM GOAL #4   Title  Pt will report being able to perform her daily walks without using golf club as AD for stability    Time  8    Period  Weeks    Status  New      PT LONG TERM GOAL #5   Title  Pt will demonstrate ability to safely use  gardening bench to decrease strain on low back and pelvis    Time  8    Period  Weeks    Status  New            Plan - 04/29/20 [id number]    Clinical Impression Statement  Pt continues to demonstrate good progress with balance.  Able to progress corner balance exercises today and progress supine LE strengthening exercises to include a bridge with hip ADD and ABD.  Pt finished with pool next week and plans to find a community pool to continue to perform aquatic exercises.  Will continue to progress towards LTG.    Personal Factors and Comorbidities  Comorbidity 3+    Comorbidities  RA, DDD, osteoporosis, low back pain, HTN, SI joint pain, R malignant neoplasm of breast with radiation, metatarsalgiaUse LUE for blood pressure    Examination-Activity Limitations  Bend;Locomotion Level;Squat;Stairs;Stand    Examination-Participation Restrictions  Community Activity;Yard Work    Stability/Clinical Decision Making  Stable/Uncomplicated    Rehab Potential  Good    PT Frequency  2x / week    PT Duration  8 weeks    PT Treatment/Interventions  ADLs/Self Care Home Management;Aquatic Therapy;Cryotherapy;Moist Heat;Gait training;Stair training;Functional mobility training;Therapeutic activities;Therapeutic exercise;Balance training;Neuromuscular re-education;Patient/family education;Manual techniques;Passive range of motion;Dry needling    PT Next Visit Plan  RUE RESTRICTED FOR BP; PT HAS RA SO GENTLE STRENGTHENING AND ROM.  Final pool session - please send me the pool HEP and I will print for her at our next land appt.  Land-continue higher level balance training and strengthening.  SLS, tandem, compliant surfaces    Consulted and Agree with Plan of Care  Patient       Patient will benefit from skilled therapeutic intervention in order to improve the following deficits and impairments:  Decreased balance, Decreased range of motion, Decreased strength, Difficulty walking, Pain  Visit  Diagnosis: Unsteadiness on feet  Muscle weakness (generalized)  Difficulty in walking, not elsewhere classified  Pain in right hip     Problem List Patient Active Problem List   Diagnosis Date Noted  . Pruritus of scalp 06/20/2018  . DDD (degenerative disc disease), lumbar 05/27/2018  . Essential hypertension 03/19/2017  . History of pityriasis rosea 03/19/2017  . 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 History of malignant neoplasm of breast 03/19/2017  . Eczema 03/05/2017  . Vulvar dermatitis 03/05/2017  . Rheumatoid arthritis  10/15/2016  . High risk medication use 10/15/2016  . 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 S/P lumpectomy of breast 12/03/2011  . 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

## 2020-05-03 NOTE — Progress Notes (Signed)
RBC count is borderline low.  MCV and MCH are elevated.  Please clarify how the patient is taking folic acid.  She should be taking folic acid 1 mg 2 tablets daily.   Please add folate and B12.

## 2020-05-04 ENCOUNTER — Ambulatory Visit: Payer: Medicare PPO | Attending: Family Medicine | Admitting: Physical Therapy

## 2020-05-04 ENCOUNTER — Ambulatory Visit: Payer: Medicare PPO | Admitting: Physical Therapy

## 2020-05-04 ENCOUNTER — Telehealth: Payer: Self-pay | Admitting: Rheumatology

## 2020-05-04 ENCOUNTER — Other Ambulatory Visit: Payer: Self-pay

## 2020-05-04 ENCOUNTER — Encounter: Payer: Self-pay | Admitting: Physical Therapy

## 2020-05-04 DIAGNOSIS — R2681 Unsteadiness on feet: Secondary | ICD-10-CM

## 2020-05-04 DIAGNOSIS — R262 Difficulty in walking, not elsewhere classified: Secondary | ICD-10-CM | POA: Diagnosis not present

## 2020-05-04 DIAGNOSIS — M25551 Pain in right hip: Secondary | ICD-10-CM | POA: Diagnosis not present

## 2020-05-04 DIAGNOSIS — M6281 Muscle weakness (generalized): Secondary | ICD-10-CM | POA: Insufficient documentation

## 2020-05-04 NOTE — Telephone Encounter (Signed)
Patient advised of lab results. Please see lab note for further details.

## 2020-05-04 NOTE — Telephone Encounter (Signed)
Patient left a voicemail stating she was returning a call to the office.   °

## 2020-05-04 NOTE — Therapy (Signed)
[facility] [address] [place], [place], [address] Phone: [phone]   Fax:  [phone]  Physical Therapy Treatment  Patient Details  Name: Melissa Perez MRN: [id number] Date of Birth: 1946-10-23 Referring Provider (PT): Elyn Peers, MD   Encounter Date: 05/04/2020  PT End of Session - 05/04/20 1942    Visit Number  14    Number of Visits  17    Date for PT Re-Evaluation  05/16/20    Authorization Type  [employer]; $20 copay; 10th visit PN    Progress Note Due on Visit  20    PT Start Time  1500    PT Stop Time  1545    PT Time Calculation (min)  45 min    Equipment Utilized During Treatment  Other (comment)   buoyancy bar bells   Activity Tolerance  Patient tolerated treatment well    Behavior During Therapy  [facility] for tasks assessed/performed       Past Medical History:  Diagnosis Date  . Arthritis    rheumatoid, take Humira  . Arthritis    rheumatoid  . Arthritis pain   . Back pain   . Breast cancer ([place])   . Cancer ([place])   . History of dental surgery   . Hyperlipidemia   . Hypertension   . Personal history of radiation therapy   . Rash   . Rheumatoid arthritis ([place])   . S/P lumpectomy of breast 12/03/2011    Past Surgical History:  Procedure Laterality Date  . ABDOMINAL HYSTERECTOMY    . APPENDECTOMY    . BREAST EXCISIONAL BIOPSY Left 1991   benign  . BREAST LUMPECTOMY Right 11/2011  . BREAST SURGERY     2 left breast biopsies-benign  . left breast biopsies  1991 & 1990    There were no vitals filed for this visit.  Subjective Assessment - 05/04/20 1940    Subjective  Pt got information before session on joining [facility] with her [employer] program to allow her to continue aquatics as HEP.  Continues to feel improvement in mobility and balance.    Pertinent History  RA, DDD, osteoporosis, low back pain, HTN, SI joint pain, R malignant neoplasm of breast with radiation,  metatarsalgia, Use LUE for blood pressure    Limitations  Standing;Walking    How long can you walk comfortably?  40-50 minutes or 2 miles, 5 per week    Patient Stated Goals  To find out what is causing the staggering, doesn't want to fall    Currently in Pain?  No/denies        Aquatic therapy at [facility] - pool temp 86.7degrees  Patient seen for aquatic therapy today. Treatment took place in water 3.5-4 feet deep depending upon activity. Pt entered and exited the pool viasteps withsupervision:   Runners stretch bil LE's x 30 seconds eachand toes up wall x 30 sec.  Hamstring stretch at pool rail x 30 sec each side.  Seated on pool bench for bil LE LAQ and hip flexion x 15 reps.  Pt challenged maintaining seated position due to buoyancy.   Sit<>stand x 10 reps from bench with min-supervision.  Needed cues for forward lean to prevent leaning back against bench with LE's.  Pt performed gait training in pool forwards 72m x4repsworking on hands underwater for armswing, 35mx 2 reps backwards,39m x 2 repsside stepping, 35m x 2 performing side step squat withhand bar bells.  Performedbil LE marching,  hip extension, hip abd, hamstring curl, SLR with buoyancy cuffs. Pt able to perform 15repsalternating sides. Heel raises x 15.Pt using pool edge for UE support for most of exercises.  Facing away from pool edge and performed bil UE shoulder flexion/extension, shoulder abd/add both x 15 reps each.  Ai Chi posture of floating, enclosing, soothing in semi squat positon x 15 reps.   Needed cues for trunk rotation with gathering.   Alternating LE taps to blue tile on pool wall for balance for brief pds of SLS.  Standing feet together with EC x 30 sec, head turns with EO x 10 then head turns with EC x 10 reps.  No LOB with any of these positions today.   Pt requires buoyancy for support with balance and viscosity for resistance for strengthening exercises; buoyancy is also  needed for off loading body to assist with exercises.    PT Education - 05/04/20 1941    Education Details  aquatic HEP    Person(s) Educated  Patient    Methods  Explanation;Demonstration;Verbal cues;Handout   handout to be provided at next land session   Comprehension  Verbalized understanding       PT Short Term Goals - 04/15/20 0805      PT SHORT TERM GOAL #1   Title  Pt will initiate and demonstrate independence with HEP    Baseline  Pt reports HEP going well, 1-2 exercises she does not do frequently.    Time  4    Period  Weeks    Status  On-going    Target Date  04/16/20      PT SHORT TERM GOAL #2   Title  Pt will initiate aquatic therapy    Baseline  Aquatic Therapy was initiated on 04/06/20    Time  4    Period  Weeks    Status  Achieved    Target Date  04/16/20      PT SHORT TERM GOAL #3   Title  Pt will demonstrate 2 point improvement in FGA to indicate decreased falls risk    Baseline  21/30, 25/30 on 5/14    Time  4    Period  Weeks    Status  Achieved    Target Date  04/16/20      PT SHORT TERM GOAL #4   Title  Pt will report 25% reduction in lumbar, SI and R hip pain    Baseline  Pt reports 55% decrease in pain    Time  4    Period  Weeks    Status  Achieved    Target Date  04/16/20        PT Long Term Goals - 05/04/20 1941      PT LONG TERM GOAL #1   Title  Pt will demonstrate independence with final land and aquatic HEP    Time  8    Period  Weeks    Status  Achieved      PT LONG TERM GOAL #2   Title  Pt will demonstrate decreased falls risk as indicated by increase in FGA to >/= 27/30    Time  8    Period  Weeks    Status  Revised      PT LONG TERM GOAL #3   Title  Pt will report 75% reduction in low back, R hip and SI joint pain    Time  8    Period  Weeks    Status  Revised      PT LONG TERM GOAL #4   Title  Pt will report being able to perform her daily walks without using golf club as AD for stability    Time  8    Period   Weeks    Status  New      PT LONG TERM GOAL #5   Title  Pt will demonstrate ability to safely use gardening bench to decrease strain on low back and pelvis    Time  8    Period  Weeks    Status  New            Plan - 05/04/20 1942    Clinical Impression Statement  Pt has made excellent progress with her aquatic sessions regarding balance and strength and endurance in the pool.  Pt to continue with aquatic HEP on her own and plans to join [facility].  Cont PT per POC.    Personal Factors and Comorbidities  Comorbidity 3+    Comorbidities  RA, DDD, osteoporosis, low back pain, HTN, SI joint pain, R malignant neoplasm of breast with radiation, metatarsalgiaUse LUE for blood pressure    Examination-Activity Limitations  Bend;Locomotion Level;Squat;Stairs;Stand    Examination-Participation Restrictions  Community Activity;Yard Work    Stability/Clinical Decision Making  Stable/Uncomplicated    Rehab Potential  Good    PT Frequency  2x / week    PT Duration  8 weeks    PT Treatment/Interventions  ADLs/Self Care Home Management;Aquatic Therapy;Cryotherapy;Moist Heat;Gait training;Stair training;Functional mobility training;Therapeutic activities;Therapeutic exercise;Balance training;Neuromuscular re-education;Patient/family education;Manual techniques;Passive range of motion;Dry needling    PT Next Visit Plan  RUE RESTRICTED FOR BP; PT HAS RA SO GENTLE STRENGTHENING AND ROM. [facility]-please print out her aquatic HEP from [place] (see code below).  Land-continue higher level balance training and strengthening.  SLS, tandem, compliant surfaces    PT Home Exercise Plan  Aquatic HEP Access Code: [email or web address]    Consulted and Agree with Plan of Care  Patient       Patient will benefit from skilled therapeutic intervention in order to improve the following deficits and impairments:  Decreased balance, Decreased range of motion, Decreased strength, Difficulty walking, Pain  Visit Diagnosis: Unsteadiness  on feet  Muscle weakness (generalized)  Difficulty in walking, not elsewhere classified     Problem List Patient Active Problem List   Diagnosis Date Noted  . Pruritus of scalp 06/20/2018  . DDD (degenerative disc disease), lumbar 05/27/2018  . Essential hypertension 03/19/2017  . History of pityriasis rosea 03/19/2017  . 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 History of malignant neoplasm of breast 03/19/2017  . Eczema 03/05/2017  . Vulvar dermatitis 03/05/2017  . Rheumatoid arthritis  10/15/2016  . High risk medication use 10/15/2016  . 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 S/P lumpectomy of breast 12/03/2011  . 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

## 2020-05-04 NOTE — Patient Instructions (Signed)
Access Code: [email or web address] URL: [email or web address] Date: 05/04/2020 Prepared by: Nita Sells  Exercises Gastroc Stretch on Wall - 1 x daily - 2 x weekly - 1 reps - 30 seconds hold Gastroc Stretch with Foot at Wall - 1 x daily - 2 x weekly - 1 reps - 30 seconds hold Standing Hip Abduction Adduction at [employer] - 1 x daily - 2 x weekly - 10 reps Standing Hip Flexion Extension at [employer] - 1 x daily - 2 x weekly - 10 reps Standing March at [place] 1 x daily - 2 x weekly - 10 reps Squat - 1 x daily - 2 x weekly - 10 reps Forward Walking - 1 x daily - 2 x weekly - 4 sets Backward Walking - 1 x daily - 2 x weekly - 4 sets Floating - 1 x daily - 2 x weekly - 2 sets - 10 reps Soothing - 1 x daily - 2 x weekly - 10 reps Enclosing - 1 x daily - 2 x weekly - 10 reps Single Leg Stance at Pool Wall - 1 x daily - 2 x weekly - 3 sets - 20 second hold

## 2020-05-06 ENCOUNTER — Ambulatory Visit: Payer: Medicare PPO

## 2020-05-06 ENCOUNTER — Other Ambulatory Visit: Payer: Self-pay

## 2020-05-06 DIAGNOSIS — M6281 Muscle weakness (generalized): Secondary | ICD-10-CM | POA: Diagnosis not present

## 2020-05-06 DIAGNOSIS — R262 Difficulty in walking, not elsewhere classified: Secondary | ICD-10-CM

## 2020-05-06 DIAGNOSIS — M25551 Pain in right hip: Secondary | ICD-10-CM | POA: Diagnosis not present

## 2020-05-06 DIAGNOSIS — R2681 Unsteadiness on feet: Secondary | ICD-10-CM | POA: Diagnosis not present

## 2020-05-06 NOTE — Therapy (Signed)
[facility] [address] [place], [place], [address] Phone: [phone]   Fax:  [phone]  Physical Therapy Treatment  Patient Details  Name: Melissa Perez MRN: [id number] Date of Birth: January 17, 1946 Referring Provider (PT): Elyn Peers, MD   Encounter Date: 05/06/2020  PT End of Session - 05/06/20 0806    Visit Number  15    Number of Visits  17    Date for PT Re-Evaluation  05/16/20    Authorization Type  [employer]; $20 copay; 10th visit PN    Progress Note Due on Visit  20    PT Start Time  0801    PT Stop Time  0843    PT Time Calculation (min)  42 min    Equipment Utilized During Treatment  Gait belt   buoyancy bar bells   Activity Tolerance  Patient tolerated treatment well    Behavior During Therapy  WFL for tasks assessed/performed       Past Medical History:  Diagnosis Date   Arthritis    rheumatoid, take Humira   Arthritis    rheumatoid   Arthritis pain    Back pain    Breast cancer ([place])    Cancer ([place])    History of dental surgery    Hyperlipidemia    Hypertension    Personal history of radiation therapy    Rash    Rheumatoid arthritis ([place])    S/P lumpectomy of breast 12/03/2011    Past Surgical History:  Procedure Laterality Date   ABDOMINAL HYSTERECTOMY     APPENDECTOMY     BREAST EXCISIONAL BIOPSY Left 1991   benign   BREAST LUMPECTOMY Right 11/2011   BREAST SURGERY     2 left breast biopsies-benign   left breast biopsies  1991 & 1990    There were no vitals filed for this visit.  Subjective Assessment - 05/06/20 0803    Subjective  Patient reports that she still feels pain in hip, overall doing well continues to feel/notice improvements. Reports updated HEP as well.    Pertinent History  RA, DDD, osteoporosis, low back pain, HTN, SI joint pain, R malignant neoplasm of breast with radiation, metatarsalgia, Use LUE for blood pressure    Limitations   Standing;Walking    How long can you walk comfortably?  40-50 minutes or 2 miles, 5 per week    Patient Stated Goals  To find out what is causing the staggering, doesn't want to fall    Currently in Pain?  No/denies                        [facility] Adult PT Treatment/Exercise - 05/06/20 0829      Knee/Hip Exercises: Standing   Hip Abduction  Stengthening;Both;1 set;10 reps;Knee straight    Hip Extension  Stengthening;Both;1 set;10 reps;Knee straight    Extension Limitations  verbal cues to keep knees straight with completion.       Knee/Hip Exercises: Supine   Bridges with Diona Foley Squeeze  Strengthening;Both;1 set;10 reps    Other Supine Knee/Hip Exercises  Completed isometric hamstring curls with therapy ball, 1 x 15 reps with 5 sec hold. Completed alternating trunk rotations with therapy ball 1 x 15 reps, focus on control with completion.     Other Supine Knee/Hip Exercises  Supine dead bug with opposite UE/LE extension, 2  x 10 reps.  PT provided demonstration, pt require verbal cues for proper completion.  Balance Exercises - 05/06/20 0001      Balance Exercises: Standing   Standing Eyes Closed  Narrow base of support (BOS);Foam/compliant surface;3 reps;30 secs    Tandem Stance  Eyes open;Eyes closed;Foam/compliant surface;3 reps;30 secs;Limitations    Tandem Stance Time  completed x 1 rep with EO, progressed to EC due to patient improved balance with EO. alternating leading lower extremirty    SLS  Eyes open;Foam/compliant surface;3 reps;30 secs;Limitations    SLS Limitations  Completed alternating SLS on BLE, progressed from light UE support to no UE support in // bars          PT Short Term Goals - 04/15/20 0805      PT SHORT TERM GOAL #1   Title  Pt will initiate and demonstrate independence with HEP    Baseline  Pt reports HEP going well, 1-2 exercises she does not do frequently.    Time  4    Period  Weeks    Status  On-going    Target Date   04/16/20      PT SHORT TERM GOAL #2   Title  Pt will initiate aquatic therapy    Baseline  Aquatic Therapy was initiated on 04/06/20    Time  4    Period  Weeks    Status  Achieved    Target Date  04/16/20      PT SHORT TERM GOAL #3   Title  Pt will demonstrate 2 point improvement in FGA to indicate decreased falls risk    Baseline  21/30, 25/30 on 5/14    Time  4    Period  Weeks    Status  Achieved    Target Date  04/16/20      PT SHORT TERM GOAL #4   Title  Pt will report 25% reduction in lumbar, SI and R hip pain    Baseline  Pt reports 55% decrease in pain    Time  4    Period  Weeks    Status  Achieved    Target Date  04/16/20        PT Long Term Goals - 05/04/20 1941      PT LONG TERM GOAL #1   Title  Pt will demonstrate independence with final land and aquatic HEP    Time  8    Period  Weeks    Status  Achieved      PT LONG TERM GOAL #2   Title  Pt will demonstrate decreased falls risk as indicated by increase in FGA to >/= 27/30    Time  8    Period  Weeks    Status  Revised      PT LONG TERM GOAL #3   Title  Pt will report 75% reduction in low back, R hip and SI joint pain    Time  8    Period  Weeks    Status  Revised      PT LONG TERM GOAL #4   Title  Pt will report being able to perform her daily walks without using golf club as AD for stability    Time  8    Period  Weeks    Status  New      PT LONG TERM GOAL #5   Title  Pt will demonstrate ability to safely use gardening bench to decrease strain on low back and pelvis    Time  8    Period  Weeks  Status  New            Plan - 05/06/20 2119    Clinical Impression Statement  Today's skilled PT session focused on continued balance and BLE strengthening, with patient tolerating well. Provided patient with printed aquatic HEP today to allow for completion outside of therapy upon joining [facility]. Patient will continue to benefit from skilled PT services to progress toward unmet goals.     Personal Factors and Comorbidities  Comorbidity 3+    Comorbidities  RA, DDD, osteoporosis, low back pain, HTN, SI joint pain, R malignant neoplasm of breast with radiation, metatarsalgiaUse LUE for blood pressure    Examination-Activity Limitations  Bend;Locomotion Level;Squat;Stairs;Stand    Examination-Participation Restrictions  Community Activity;Yard Work    Stability/Clinical Decision Making  Stable/Uncomplicated    Rehab Potential  Good    PT Frequency  2x / week    PT Duration  8 weeks    PT Treatment/Interventions  ADLs/Self Care Home Management;Aquatic Therapy;Cryotherapy;Moist Heat;Gait training;Stair training;Functional mobility training;Therapeutic activities;Therapeutic exercise;Balance training;Neuromuscular re-education;Patient/family education;Manual techniques;Passive range of motion;Dry needling    PT Next Visit Plan  RUE RESTRICTED FOR BP; PT HAS RA SO GENTLE STRENGTHENING AND ROM. Land-continue higher level balance training and strengthening.  SLS, tandem, compliant surfaces    PT Home Exercise Plan  Aquatic HEP Access Code: [email or web address]    Consulted and Agree with Plan of Care  Patient       Patient will benefit from skilled therapeutic intervention in order to improve the following deficits and impairments:  Decreased balance, Decreased range of motion, Decreased strength, Difficulty walking, Pain  Visit Diagnosis: Unsteadiness on feet  Muscle weakness (generalized)  Difficulty in walking, not elsewhere classified     Problem List Patient Active Problem List   Diagnosis Date Noted   Pruritus of scalp 06/20/2018   DDD (degenerative disc disease), lumbar 05/27/2018   Essential hypertension 03/19/2017   History of pityriasis rosea 03/19/2017   Chronic right SI joint pain 03/19/2017   History of malignant neoplasm of breast 03/19/2017   Eczema 03/05/2017   Vulvar dermatitis 03/05/2017   Rheumatoid arthritis  10/15/2016   High risk medication use  10/15/2016   Morton's metatarsalgia 10/15/2016   Positive PPD, 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   S/P lumpectomy of breast 12/03/2011   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 [place], [place], [address] Phone: [phone]   Fax:  [phone]  Name: Melissa Perez MRN: [id number] Date of Birth: 1946-07-05

## 2020-05-11 ENCOUNTER — Other Ambulatory Visit: Payer: Self-pay

## 2020-05-11 ENCOUNTER — Ambulatory Visit: Payer: Medicare PPO | Admitting: Physical Therapy

## 2020-05-11 DIAGNOSIS — M6281 Muscle weakness (generalized): Secondary | ICD-10-CM

## 2020-05-11 DIAGNOSIS — R262 Difficulty in walking, not elsewhere classified: Secondary | ICD-10-CM | POA: Diagnosis not present

## 2020-05-11 DIAGNOSIS — R2681 Unsteadiness on feet: Secondary | ICD-10-CM | POA: Diagnosis not present

## 2020-05-11 DIAGNOSIS — M25551 Pain in right hip: Secondary | ICD-10-CM | POA: Diagnosis not present

## 2020-05-11 NOTE — Therapy (Signed)
[facility] [address] [place], [place], [address] Phone: [phone]   Fax:  [phone]  Physical Therapy Treatment  Patient Details  Name: Melissa Perez MRN: [id number] Date of Birth: 02/18/1946 Referring Provider (PT): Elyn Peers, MD   Encounter Date: 05/11/2020  PT End of Session - 05/11/20 2122    Visit Number  16    Number of Visits  17    Date for PT Re-Evaluation  05/16/20    Authorization Type  [employer]; $20 copay; 10th visit PN    Progress Note Due on Visit  20    PT Start Time  0803    PT Stop Time  0849    PT Time Calculation (min)  46 min    Activity Tolerance  Patient tolerated treatment well    Behavior During Therapy  [facility] for tasks assessed/performed       Past Medical History:  Diagnosis Date  . Arthritis    rheumatoid, take Humira  . Arthritis    rheumatoid  . Arthritis pain   . Back pain   . Breast cancer ([place])   . Cancer ([place])   . History of dental surgery   . Hyperlipidemia   . Hypertension   . Personal history of radiation therapy   . Rash   . Rheumatoid arthritis ([place])   . S/P lumpectomy of breast 12/03/2011    Past Surgical History:  Procedure Laterality Date  . ABDOMINAL HYSTERECTOMY    . APPENDECTOMY    . BREAST EXCISIONAL BIOPSY Left 1991   benign  . BREAST LUMPECTOMY Right 11/2011  . BREAST SURGERY     2 left breast biopsies-benign  . left breast biopsies  1991 & 1990    There were no vitals filed for this visit.  Subjective Assessment - 05/11/20 0808    Subjective  Nothing new to report; feels as if she is doing well and is better but would like to continue with therapy for a few more weeks to allow exercises and balance to become more automatic and engrained.    Pertinent History  RA, DDD, osteoporosis, low back pain, HTN, SI joint pain, R malignant neoplasm of breast with radiation, metatarsalgia, Use LUE for blood pressure    Limitations   Standing;Walking    How long can you walk comfortably?  40-50 minutes or 2 miles, 5 per week    Patient Stated Goals  To find out what is causing the staggering, doesn't want to fall    Currently in Pain?  Yes    Pain Score  1     Pain Location  Hip    Pain Orientation  Left;Right    Pain Descriptors / Indicators  Simonne Martinet PT Assessment - 05/11/20 0811      Assessment   Medical Diagnosis  Gait abnormality    Referring Provider (PT)  Elyn Peers, MD    Onset Date/Surgical Date  02/17/20    Prior Therapy  yes for RA      Precautions   Precautions  Other (comment)    Precaution Comments  RA, DDD, osteoporosis, low back pain, HTN, SI joint pain, R malignant neoplasm of breast with radiation, metatarsalgia      Prior Function   Level of Independence  Independent      Observation/Other Assessments   Focus on Therapeutic Outcomes (FOTO)   Not applicable, not true vertigo  Functional Gait  Assessment   Gait assessed   Yes    Gait Level Surface  Walks 20 ft in less than 5.5 sec, no assistive devices, good speed, no evidence for imbalance, normal gait pattern, deviates no more than 6 in outside of the 12 in walkway width.    Change in Gait Speed  Able to smoothly change walking speed without loss of balance or gait deviation. Deviate no more than 6 in outside of the 12 in walkway width.    Gait with Horizontal Head Turns  Performs head turns smoothly with slight change in gait velocity (eg, minor disruption to smooth gait path), deviates 6-10 in outside 12 in walkway width, or uses an assistive device.    Gait with Vertical Head Turns  Performs head turns with no change in gait. Deviates no more than 6 in outside 12 in walkway width.    Gait and Pivot Turn  Pivot turns safely within 3 sec and stops quickly with no loss of balance.    Step Over Obstacle  Is able to step over 2 stacked shoe boxes taped together (9 in total height) without changing gait speed. No evidence of  imbalance.    Gait with Narrow Base of Support  Is able to ambulate for 10 steps heel to toe with no staggering.    Gait with Eyes Closed  Walks 20 ft, no assistive devices, good speed, no evidence of imbalance, normal gait pattern, deviates no more than 6 in outside 12 in walkway width. Ambulates 20 ft in less than 7 sec.    Ambulating Backwards  Walks 20 ft, no assistive devices, good speed, no evidence for imbalance, normal gait    Steps  Alternating feet, no rail.    Total Score  29    FGA comment:  29/30 low falls risk       Added the following exercises in bold below.  Pt return demonstrated each exercise:  Access Code: [email or web address] URL: [email or web address] Date: 05/11/2020 Prepared by: Misty Stanley  Exercises Supine Lower Trunk Rotation - 1 x daily - 7 x weekly - 1 sets - 10 reps Narrow Base of Support Standing Balance Eyes Closed on Foam Pad - 1 x daily - 7 x weekly - 2 sets - 10 reps Heel-Toe walking with head turns and nods - 1 x daily - 7 x weekly - 4 sets - 10 reps Seated Figure 4 Piriformis Stretch - 1 x daily - 7 x weekly - 2 sets - 30 hold Half Tandem Stance with Head Rotation on Foam Pad - 1 x daily - 7 x weekly - 2 sets - 10 reps Half Tandem Stance with Head Nods on Foam Pad - 1 x daily - 7 x weekly - 2 sets - 10 reps Supine Bridge with Mini Swiss Ball Between Knees - 1 x daily - 7 x weekly - 2 sets - 10 reps Bridge with Abduction and Resistance Loop - 1 x daily - 7 x weekly - 2 sets - 10 reps Marching with Resistance - 1 x daily - 7 x weekly - 4 sets - 10 reps                        PT Education - 05/11/20 2120    Education Details  Progress towards goals, plan for more visits.  Role of therapy in providing pt with HEP and tools to maintain wellness and gains made in therapy and role of  intrinsic motivation.    Person(s) Educated  Patient    Methods  Explanation    Comprehension  Verbalized understanding       PT Short Term Goals  - 04/15/20 0805      PT SHORT TERM GOAL #1   Title  Pt will initiate and demonstrate independence with HEP    Baseline  Pt reports HEP going well, 1-2 exercises she does not do frequently.    Time  4    Period  Weeks    Status  On-going    Target Date  04/16/20      PT SHORT TERM GOAL #2   Title  Pt will initiate aquatic therapy    Baseline  Aquatic Therapy was initiated on 04/06/20    Time  4    Period  Weeks    Status  Achieved    Target Date  04/16/20      PT SHORT TERM GOAL #3   Title  Pt will demonstrate 2 point improvement in FGA to indicate decreased falls risk    Baseline  21/30, 25/30 on 5/14    Time  4    Period  Weeks    Status  Achieved    Target Date  04/16/20      PT SHORT TERM GOAL #4   Title  Pt will report 25% reduction in lumbar, SI and R hip pain    Baseline  Pt reports 55% decrease in pain    Time  4    Period  Weeks    Status  Achieved    Target Date  04/16/20        PT Long Term Goals - 05/11/20 0813      PT LONG TERM GOAL #1   Title  Pt will demonstrate independence with final land and aquatic HEP    Time  8    Period  Weeks    Status  On-going      PT LONG TERM GOAL #2   Title  Pt will demonstrate decreased falls risk as indicated by increase in FGA to >/= 27/30    Baseline  29/30    Time  8    Period  Weeks    Status  Achieved      PT LONG TERM GOAL #3   Title  Pt will report 75% reduction in low back, R hip and SI joint pain    Baseline  2-3/10    Time  8    Period  Weeks    Status  Achieved      PT LONG TERM GOAL #4   Title  Pt will report being able to perform her daily walks without using golf club as AD for stability    Baseline  uses golf club for safety not stability    Time  8    Period  Weeks    Status  Achieved      PT LONG TERM GOAL #5   Title  Pt will demonstrate ability to safely use gardening bench to decrease strain on low back and pelvis    Baseline  met    Time  8    Period  Weeks    Status  Achieved             Plan - 05/11/20 2123    Clinical Impression Statement  Initiated assessment of LTG with pt demonstrating good progress towards goals.  Final goal to be assessed at next visit.  Pt is demonstrates decreased pain, improved dynamic standing balance, decreased falls risk and has successfully completed aquatic therapy.  Pt continues to demonstrate LE weakness, impaired balance with higher level gait challenges and decreased independence with land HEP.  Will recertify at next visit for more visits but will decrease frequency to 1x/week to transition pt to greater independence with HEP and wellness.    Personal Factors and Comorbidities  Comorbidity 3+    Comorbidities  RA, DDD, osteoporosis, low back pain, HTN, SI joint pain, R malignant neoplasm of breast with radiation, metatarsalgia; Use LUE for blood pressure    Examination-Activity Limitations  Bend;Locomotion Level;Squat;Stairs;Stand    Examination-Participation Restrictions  Community Activity;Yard Work    Stability/Clinical Decision Making  Stable/Uncomplicated    Rehab Potential  Good    PT Frequency  2x / week    PT Duration  8 weeks    PT Treatment/Interventions  ADLs/Self Care Home Management;Aquatic Therapy;Cryotherapy;Moist Heat;Gait training;Stair training;Functional mobility training;Therapeutic activities;Therapeutic exercise;Balance training;Neuromuscular re-education;Patient/family education;Manual techniques;Passive range of motion;Dry needling    PT Next Visit Plan  Recert for 8 more visits 1x/week x 8 but will likely only need 4.  RUE RESTRICTED FOR BP; PT HAS RA SO GENTLE STRENGTHENING AND ROM. Was she able to get aquatic exercises laminated?  Did she get a gardening bench?  Continued to review and upgraded HEP: SLS, tandem, compliant surfaces    PT Home Exercise Plan  Aquatic HEP Access Code: [email or web address]    Consulted and Agree with Plan of Care  Patient       Patient will benefit from skilled therapeutic intervention  in order to improve the following deficits and impairments:  Decreased balance, Decreased range of motion, Decreased strength, Difficulty walking, Pain  Visit Diagnosis: Unsteadiness on feet  Muscle weakness (generalized)  Difficulty in walking, not elsewhere classified  Pain in right hip     Problem List Patient Active Problem List   Diagnosis Date Noted  . Pruritus of scalp 06/20/2018  . DDD (degenerative disc disease), lumbar 05/27/2018  . Essential hypertension 03/19/2017  . History of pityriasis rosea 03/19/2017  . 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 History of malignant neoplasm of breast 03/19/2017  . Eczema 03/05/2017  . Vulvar dermatitis 03/05/2017  . Rheumatoid arthritis  10/15/2016  . High risk medication use 10/15/2016  . 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 S/P lumpectomy of breast 12/03/2011  . 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 [place], [place], [address] Phone: [phone]   Fax:  [phone]  Name: Melissa Perez MRN: [id number] Date of Birth: 1945/12/25

## 2020-05-11 NOTE — Patient Instructions (Signed)
Access Code: [email or web address] URL: [email or web address] Date: 05/11/2020 Prepared by: Misty Stanley  Exercises Supine Lower Trunk Rotation - 1 x daily - 7 x weekly - 1 sets - 10 reps Narrow Base of Support Standing Balance Eyes Closed on Foam Pad - 1 x daily - 7 x weekly - 2 sets - 10 reps Heel-Toe walking with head turns and nods - 1 x daily - 7 x weekly - 4 sets - 10 reps Seated Figure 4 Piriformis Stretch - 1 x daily - 7 x weekly - 2 sets - 30 hold Half Tandem Stance with Head Rotation on Foam Pad - 1 x daily - 7 x weekly - 2 sets - 10 reps Half Tandem Stance with Head Nods on Foam Pad - 1 x daily - 7 x weekly - 2 sets - 10 reps Supine Bridge with Mini Swiss Ball Between Knees - 1 x daily - 7 x weekly - 2 sets - 10 reps Bridge with Abduction and Resistance Loop - 1 x daily - 7 x weekly - 2 sets - 10 reps Marching with Resistance - 1 x daily - 7 x weekly - 4 sets - 10 reps

## 2020-05-13 ENCOUNTER — Other Ambulatory Visit: Payer: Self-pay

## 2020-05-13 ENCOUNTER — Ambulatory Visit: Payer: Medicare PPO | Admitting: Physical Therapy

## 2020-05-13 ENCOUNTER — Encounter: Payer: Self-pay | Admitting: Physical Therapy

## 2020-05-13 DIAGNOSIS — R2681 Unsteadiness on feet: Secondary | ICD-10-CM

## 2020-05-13 DIAGNOSIS — M25551 Pain in right hip: Secondary | ICD-10-CM | POA: Diagnosis not present

## 2020-05-13 DIAGNOSIS — R262 Difficulty in walking, not elsewhere classified: Secondary | ICD-10-CM | POA: Diagnosis not present

## 2020-05-13 DIAGNOSIS — M6281 Muscle weakness (generalized): Secondary | ICD-10-CM | POA: Diagnosis not present

## 2020-05-13 NOTE — Therapy (Addendum)
[facility] [address] [place], [place], [address] Phone: [phone]   Fax:  [phone]  Physical Therapy Treatment/Re-Cert  Patient Details  Name: Melissa Perez MRN: [id number] Date of Birth: [birth year] Referring Provider (PT): Elyn Peers, MD   Encounter Date: 05/13/2020   PT End of Session - 05/13/20 0944    Visit Number 17    Number of Visits 17    Date for PT Re-Evaluation 05/16/20    Authorization Type [employer]; $20 copay; 10th visit PN    Progress Note Due on Visit 20    PT Start Time 0803    PT Stop Time 0844    PT Time Calculation (min) 41 min    Activity Tolerance Patient tolerated treatment well    Behavior During Therapy [facility] for tasks assessed/performed           Past Medical History:  Diagnosis Date  . Arthritis    rheumatoid, take Humira  . Arthritis    rheumatoid  . Arthritis pain   . Back pain   . Breast cancer ([place])   . Cancer ([place])   . History of dental surgery   . Hyperlipidemia   . Hypertension   . Personal history of radiation therapy   . Rash   . Rheumatoid arthritis ([place])   . S/P lumpectomy of breast 12/03/2011    Past Surgical History:  Procedure Laterality Date  . ABDOMINAL HYSTERECTOMY    . APPENDECTOMY    . BREAST EXCISIONAL BIOPSY Left 1991   benign  . BREAST LUMPECTOMY Right 11/2011  . BREAST SURGERY     2 left breast biopsies-benign  . left breast biopsies  1991 & 1990    There were no vitals filed for this visit.   Subjective Assessment - 05/13/20 0807    Subjective Wants to perfect her technique on the exercises.    Pertinent History RA, DDD, osteoporosis, low back pain, HTN, SI joint pain, R malignant neoplasm of breast with radiation, metatarsalgia, Use LUE for blood pressure    Limitations Standing;Walking    How long can you walk comfortably? 40-50 minutes or 2 miles, 5 per week    Patient Stated Goals To find out what is causing the  staggering, doesn't want to fall    Currently in Pain? No/denies                    Access Code: [email or web address] URL: [email or web address] Date: 05/13/2020 Prepared by: Janann August  Reviewed technique and purpose of each exercise below for HEP, educated on consistency of performing to maintain balance and strength gains made in PT.   Exercises Supine Lower Trunk Rotation - 1 x daily - 7 x weekly - 1 sets - 10 reps Narrow Base of Support Standing Balance Eyes Closed on Foam Pad - 1 x daily - 7 x weekly - 2 sets - 10 reps Heel-Toe walking with head turns and nods - 1 x daily - 7 x weekly - 4 sets - 10 reps Seated Figure 4 Piriformis Stretch - 1 x daily - 7 x weekly - 2 sets - 30 hold Half Tandem Stance with Head Rotation on Foam Pad - 1 x daily - 7 x weekly - 2 sets - 10 reps Half Tandem Stance with Head Nods on Foam Pad - 1 x daily - 7 x weekly - 2 sets - 10 reps Supine Bridge with Mini Swiss Ball Between Knees - 1 x daily -  7 x weekly - 2 sets - 10 reps Bridge with Abduction and Resistance Loop - 1 x daily - 7 x weekly - 2 sets - 10 reps Marching with Resistance - 1 x daily - 7 x weekly - 4 sets - 10 reps             PT Education - 05/13/20 0943    Education Details reviewed entirety of HEP    Person(s) Educated Patient    Methods Explanation;Demonstration    Comprehension Returned demonstration            PT Short Term Goals - 04/15/20 0805      PT SHORT TERM GOAL #1   Title Pt will initiate and demonstrate independence with HEP    Baseline Pt reports HEP going well, 1-2 exercises she does not do frequently.    Time 4    Period Weeks    Status On-going    Target Date 04/16/20      PT SHORT TERM GOAL #2   Title Pt will initiate aquatic therapy    Baseline Aquatic Therapy was initiated on 04/06/20    Time 4    Period Weeks    Status Achieved    Target Date 04/16/20      PT SHORT TERM GOAL #3   Title Pt will demonstrate 2 point  improvement in FGA to indicate decreased falls risk    Baseline 21/30, 25/30 on 5/14    Time 4    Period Weeks    Status Achieved    Target Date 04/16/20      PT SHORT TERM GOAL #4   Title Pt will report 25% reduction in lumbar, SI and R hip pain    Baseline Pt reports 55% decrease in pain    Time 4    Period Weeks    Status Achieved    Target Date 04/16/20             PT Long Term Goals - 05/13/20 0945      PT LONG TERM GOAL #1   Title Pt will demonstrate independence with final land and aquatic HEP    Baseline reviewed land HEP on 05/13/20    Time 8    Period Weeks    Status Achieved      PT LONG TERM GOAL #2   Title Pt will demonstrate decreased falls risk as indicated by increase in FGA to >/= 27/30    Baseline 29/30    Time 8    Period Weeks    Status Achieved      PT LONG TERM GOAL #3   Title Pt will report 75% reduction in low back, R hip and SI joint pain    Baseline 2-3/10    Time 8    Period Weeks    Status Achieved      PT LONG TERM GOAL #4   Title Pt will report being able to perform her daily walks without using golf club as AD for stability    Baseline uses golf club for safety not stability    Time 8    Period Weeks    Status Achieved      PT LONG TERM GOAL #5   Title Pt will demonstrate ability to safely use gardening bench to decrease strain on low back and pelvis    Baseline met    Time 8    Period Weeks    Status Achieved  New goal for recertification:  PT Short Term Goals - 05/14/20 1350      PT SHORT TERM GOAL #1   Title = LTG           PT Long Term Goals - 05/14/20 1350      PT LONG TERM GOAL #1   Title Pt will demonstrate independence with updated and final land and aquatic HEP and will report compliance 3x/week    Time 4    Period Weeks    Status Revised    Target Date 06/13/20                Plan - 05/13/20 0947    Clinical Impression Statement Focus of today's session was reviewing pt's final HEP  for land. Pt needing initial verbal and demo cues at times for technique, but able to perform each exercise correctly. Educated pt on continued importance of performing to maintain gains made in therapy. Will perform re-cert to continue to make sure pt feels comfortable and is performing final HEP for incr independence.  Addendum by Primary PT: Pt is demonstrates decreased pain, improved dynamic standing balance, decreased falls risk and has successfully completed aquatic therapy. Pt continues to demonstrate LE weakness, impaired balance with higher level gait challenges and decreased independence with land HEP. Will recertify for more visits but will decrease frequency to 1x/week to transition pt to greater independence with HEP and wellness.    Personal Factors and Comorbidities Comorbidity 3+    Comorbidities RA, DDD, osteoporosis, low back pain, HTN, SI joint pain, R malignant neoplasm of breast with radiation, metatarsalgia; Use LUE for blood pressure    Examination-Activity Limitations Bend;Locomotion Level;Squat;Stairs;Stand    Examination-Participation Restrictions Community Activity;Yard Work    Stability/Clinical Decision Making Stable/Uncomplicated    Rehab Potential Good    PT Frequency 1x / week    PT Duration 4 weeks    PT Treatment/Interventions ADLs/Self Care Home Management;Aquatic Therapy;Cryotherapy;Moist Heat;Gait training;Stair training;Functional mobility training;Therapeutic activities;Therapeutic exercise;Balance training;Neuromuscular re-education;Patient/family education;Manual techniques;Passive range of motion;Dry needling    PT Next Visit Plan Sheniece Ruggles has met all other goals, she just wanted a few more sessions to solidify her HEP and make sure she feels independent with it - she probably won't need all 4 visits - they are all with you (AP).  RUE RESTRICTED FOR BP; PT HAS RA SO GENTLE STRENGTHENING AND ROM. Was she able to get aquatic exercises laminated?  Did she get a  gardening bench?  Continued to review and upgraded HEP: SLS, tandem, compliant surfaces    PT Home Exercise Plan Aquatic HEP Access Code: [email or web address]    Consulted and Agree with Plan of Care Patient           Patient will benefit from skilled therapeutic intervention in order to improve the following deficits and impairments:  Decreased balance, Decreased range of motion, Decreased strength, Difficulty walking, Pain  Visit Diagnosis: Muscle weakness (generalized)  Unsteadiness on feet  Difficulty in walking, not elsewhere classified     Problem List Patient Active Problem List   Diagnosis Date Noted  . Pruritus of scalp 06/20/2018  . DDD (degenerative disc disease), lumbar 05/27/2018  . Essential hypertension 03/19/2017  . History of pityriasis rosea 03/19/2017  . 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 History of malignant neoplasm of breast 03/19/2017  . Eczema 03/05/2017  . Vulvar dermatitis 03/05/2017  . Rheumatoid arthritis  10/15/2016  . High risk medication use 10/15/2016  . Morton's metatarsalgia  10/15/2016  . 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 S/P lumpectomy of breast 12/03/2011  . 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 [place], [place], [address] Phone: [phone]   Fax:  [phone]  Name: ADLYN FIFE MRN: [id number] Date of Birth: 04-Apr-1946

## 2020-05-13 NOTE — Patient Instructions (Signed)
Access Code: [email or web address] URL: [email or web address] Date: 05/13/2020 Prepared by: Janann August  Exercises Supine Lower Trunk Rotation - 1 x daily - 7 x weekly - 1 sets - 10 reps Narrow Base of Support Standing Balance Eyes Closed on Foam Pad - 1 x daily - 7 x weekly - 2 sets - 10 reps Heel-Toe walking with head turns and nods - 1 x daily - 7 x weekly - 4 sets - 10 reps Seated Figure 4 Piriformis Stretch - 1 x daily - 7 x weekly - 2 sets - 30 hold Half Tandem Stance with Head Rotation on Foam Pad - 1 x daily - 7 x weekly - 2 sets - 10 reps Half Tandem Stance with Head Nods on Foam Pad - 1 x daily - 7 x weekly - 2 sets - 10 reps Supine Bridge with Mini Swiss Ball Between Knees - 1 x daily - 7 x weekly - 2 sets - 10 reps Bridge with Abduction and Resistance Loop - 1 x daily - 7 x weekly - 2 sets - 10 reps Marching with Resistance - 1 x daily - 7 x weekly - 4 sets - 10 reps

## 2020-05-14 NOTE — Addendum Note (Signed)
Addended by: Misty Stanley F on: 05/14/2020 02:00 PM   Modules accepted: Orders

## 2020-05-17 DIAGNOSIS — I10 Essential (primary) hypertension: Secondary | ICD-10-CM | POA: Diagnosis not present

## 2020-05-17 DIAGNOSIS — E669 Obesity, unspecified: Secondary | ICD-10-CM | POA: Diagnosis not present

## 2020-05-17 DIAGNOSIS — E785 Hyperlipidemia, unspecified: Secondary | ICD-10-CM | POA: Diagnosis not present

## 2020-05-18 ENCOUNTER — Ambulatory Visit: Payer: Medicare PPO

## 2020-05-18 ENCOUNTER — Other Ambulatory Visit: Payer: Self-pay

## 2020-05-18 DIAGNOSIS — M6281 Muscle weakness (generalized): Secondary | ICD-10-CM

## 2020-05-18 DIAGNOSIS — R262 Difficulty in walking, not elsewhere classified: Secondary | ICD-10-CM | POA: Diagnosis not present

## 2020-05-18 DIAGNOSIS — M25551 Pain in right hip: Secondary | ICD-10-CM | POA: Diagnosis not present

## 2020-05-18 DIAGNOSIS — R2681 Unsteadiness on feet: Secondary | ICD-10-CM

## 2020-05-18 NOTE — Therapy (Signed)
[facility] [address] [place], [place], [address] Phone: [phone]   Fax:  [phone]  Physical Therapy Treatment  Patient Details  Name: Melissa Perez MRN: [id number] Date of Birth: [birth year] Referring Provider (PT): Elyn Peers, MD   Encounter Date: 05/18/2020   PT End of Session - 05/18/20 0805    Visit Number 18    Number of Visits 21    Date for PT Re-Evaluation 06/13/20    Authorization Type [employer]; $20 copay; 10th visit PN    Progress Note Due on Visit 20    PT Start Time 0800    PT Stop Time 0842    PT Time Calculation (min) 42 min    Equipment Utilized During Treatment Gait belt    Activity Tolerance Patient tolerated treatment well    Behavior During Therapy [facility] for tasks assessed/performed           Past Medical History:  Diagnosis Date  . Arthritis    rheumatoid, take Humira  . Arthritis    rheumatoid  . Arthritis pain   . Back pain   . Breast cancer ([place])   . Cancer ([place])   . History of dental surgery   . Hyperlipidemia   . Hypertension   . Personal history of radiation therapy   . Rash   . Rheumatoid arthritis ([place])   . S/P lumpectomy of breast 12/03/2011    Past Surgical History:  Procedure Laterality Date  . ABDOMINAL HYSTERECTOMY    . APPENDECTOMY    . BREAST EXCISIONAL BIOPSY Left 1991   benign  . BREAST LUMPECTOMY Right 11/2011  . BREAST SURGERY     2 left breast biopsies-benign  . left breast biopsies  1991 & 1990    There were no vitals filed for this visit.   Subjective Assessment - 05/18/20 0803    Subjective Patient reports that she is feeling well. Reports she has noticed improvements in her low back/hip area. Would like to review HEP again to ensure proper completion.    Pertinent History RA, DDD, osteoporosis, low back pain, HTN, SI joint pain, R malignant neoplasm of breast with radiation, metatarsalgia, Use LUE for blood pressure    Limitations  Standing;Walking    How long can you walk comfortably? 40-50 minutes or 2 miles, 5 per week    Patient Stated Goals To find out what is causing the staggering, doesn't want to fall    Currently in Pain? No/denies           Access Code: [email or web address] URL: [email or web address] Date: 05/18/2020 Prepared by: Baldomero Lamy  Completed the following exercises (as included in HEP) to continue reviewing current HEP to allow for proper completion at home. PT encouraged patient "teaching" PT how to complete all exercises as she would at home, without assistance from PT. PT then provided any verbal and/or tactile cues for proper completion as needed. Overall patient demonstrated all exercises correctly, require minimal cueing throughout exercises. PT continued to provide techniques, tips for each exercise as necessary.   Supine Lower Trunk Rotation - 1 x daily - 7 x weekly - 1 sets - 10 reps Narrow Base of Support Standing Balance Eyes Closed on Foam Pad - 1 x daily - 7 x weekly - 2 sets - 10 reps Heel-Toe walking with head turns and nods - 1 x daily - 7 x weekly - 4 sets - 10 reps Seated Figure 4 Piriformis Stretch - 1 x daily -  7 x weekly - 2 sets - 30 hold Half Tandem Stance with Head Rotation on Foam Pad - 1 x daily - 7 x weekly - 2 sets - 10 reps Half Tandem Stance with Head Nods on Foam Pad - 1 x daily - 7 x weekly - 2 sets - 10 reps Supine Bridge with Mini Swiss Ball Between Knees - 1 x daily - 7 x weekly - 2 sets - 10 reps Bridge with Abduction and Resistance Loop - 1 x daily - 7 x weekly - 2 sets - 10 reps Marching with Resistance - 1 x daily - 7 x weekly - 4 sets - 10 reps                        PT Short Term Goals - 05/14/20 1350      PT SHORT TERM GOAL #1   Title = LTG             PT Long Term Goals - 05/14/20 1350      PT LONG TERM GOAL #1   Title Pt will demonstrate independence with updated and final land and aquatic HEP and will report  compliance 3x/week    Time 4    Period Weeks    Status Revised    Target Date 06/13/20                 Plan - 05/18/20 0844    Clinical Impression Statement Continued review and completion of land HEP in today's session to ensure patient is completing properly. PT used teach back method during today's session to facilaite learning and independence of HEP. Pt required minimal cueing for proper completion. Patient is on track to discharge at end of POC.    Personal Factors and Comorbidities Comorbidity 3+    Comorbidities RA, DDD, osteoporosis, low back pain, HTN, SI joint pain, R malignant neoplasm of breast with radiation, metatarsalgia; Use LUE for blood pressure    Examination-Activity Limitations Bend;Locomotion Level;Squat;Stairs;Stand    Examination-Participation Restrictions Community Activity;Yard Work    Stability/Clinical Decision Making Stable/Uncomplicated    Rehab Potential Good    PT Frequency 1x / week    PT Duration 4 weeks    PT Treatment/Interventions ADLs/Self Care Home Management;Aquatic Therapy;Cryotherapy;Moist Heat;Gait training;Stair training;Functional mobility training;Therapeutic activities;Therapeutic exercise;Balance training;Neuromuscular re-education;Patient/family education;Manual techniques;Passive range of motion;Dry needling    PT Next Visit Plan Review HEP? Complete balance exercises.    PT Home Exercise Plan Aquatic HEP Access Code: [email or web address]    Consulted and Agree with Plan of Care Patient           Patient will benefit from skilled therapeutic intervention in order to improve the following deficits and impairments:  Decreased balance, Decreased range of motion, Decreased strength, Difficulty walking, Pain  Visit Diagnosis: Muscle weakness (generalized)  Unsteadiness on feet  Difficulty in walking, not elsewhere classified     Problem List Patient Active Problem List   Diagnosis Date Noted  . Pruritus of scalp 06/20/2018  . DDD  (degenerative disc disease), lumbar 05/27/2018  . Essential hypertension 03/19/2017  . History of pityriasis rosea 03/19/2017  . 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 History of malignant neoplasm of breast 03/19/2017  . Eczema 03/05/2017  . Vulvar dermatitis 03/05/2017  . Rheumatoid arthritis  10/15/2016  . High risk medication use 10/15/2016  . 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 S/P lumpectomy of breast 12/03/2011  . Primary  cancer of lower outer quadrant of right female breast ([place]) 11/01/2011    Jones Bales, PT, DPT 05/18/2020, 9:36 AM  [facility] [address] [place], [place], [address] Phone: [phone]   Fax:  [phone]  Name: LIVIYA SANTINI MRN: [id number] Date of Birth: [birth year]

## 2020-05-25 ENCOUNTER — Ambulatory Visit: Payer: Medicare PPO

## 2020-05-25 ENCOUNTER — Other Ambulatory Visit: Payer: Self-pay

## 2020-05-25 DIAGNOSIS — M25551 Pain in right hip: Secondary | ICD-10-CM | POA: Diagnosis not present

## 2020-05-25 DIAGNOSIS — R262 Difficulty in walking, not elsewhere classified: Secondary | ICD-10-CM

## 2020-05-25 DIAGNOSIS — R2681 Unsteadiness on feet: Secondary | ICD-10-CM

## 2020-05-25 DIAGNOSIS — M6281 Muscle weakness (generalized): Secondary | ICD-10-CM

## 2020-05-25 NOTE — Therapy (Signed)
[facility] [address] [place], [place], [address] Phone: [phone]   Fax:  [phone]  Physical Therapy Treatment  Patient Details  Name: Melissa Perez MRN: [id number] Date of Birth: Feb 05, 1946 Referring Provider (PT): Elyn Peers, MD   Encounter Date: 05/25/2020   PT End of Session - 05/25/20 0848    Visit Number 19    Number of Visits 21    Date for PT Re-Evaluation 06/13/20    Authorization Type [employer]; $20 copay; 10th visit PN    Progress Note Due on Visit 20    PT Start Time 0846    PT Stop Time 0928    PT Time Calculation (min) 42 min    Equipment Utilized During Treatment Gait belt    Activity Tolerance Patient tolerated treatment well    Behavior During Therapy [facility] for tasks assessed/performed           Past Medical History:  Diagnosis Date  . Arthritis    rheumatoid, take Humira  . Arthritis    rheumatoid  . Arthritis pain   . Back pain   . Breast cancer ([place])   . Cancer ([place])   . History of dental surgery   . Hyperlipidemia   . Hypertension   . Personal history of radiation therapy   . Rash   . Rheumatoid arthritis ([place])   . S/P lumpectomy of breast 12/03/2011    Past Surgical History:  Procedure Laterality Date  . ABDOMINAL HYSTERECTOMY    . APPENDECTOMY    . BREAST EXCISIONAL BIOPSY Left 1991   benign  . BREAST LUMPECTOMY Right 11/2011  . BREAST SURGERY     2 left breast biopsies-benign  . left breast biopsies  1991 & 1990    There were no vitals filed for this visit.   Subjective Assessment - 05/25/20 0848    Subjective Patient reports that she walked this morning about 45 minutes. Patient reports that she has been working to do HEP more consistently.    Pertinent History RA, DDD, osteoporosis, low back pain, HTN, SI joint pain, R malignant neoplasm of breast with radiation, metatarsalgia, Use LUE for blood pressure    Limitations Standing;Walking    How long  can you walk comfortably? 40-50 minutes or 2 miles, 5 per week    Patient Stated Goals To find out what is causing the staggering, doesn't want to fall    Currently in Pain? No/denies                             [facility] Adult PT Treatment/Exercise - 05/25/20 0001      Self-Care   Self-Care Other Self-Care Comments    Other Self-Care Comments  In effort to improve compliance and management of HEP upon discharge, PT provided education on how to properly split up HEP daily. PT educating to complete strenghtening exercises together, and then next day completing balance/stretching exercises. This allows for patient to alternate days of completion to help improve compliance and not feeling overwhelmed. PT also reporting that to include aquatic therapy HEP in there as well when return to pool. Patient verbalziign agreement and believes this will help with the management of exercises. Patient is going to complete this prior to next visit and report back if it helps. PT provided with HEP handout on strenghtening, and seperate handout on balance/stretching to decrease confusion for patient.  Exercises   Exercises Other Exercises    Other Exercises  per patient's request, reviewed completion of alternating trunk rotations. with PT providing verbal cues for proper starting positioning and range of completion. Patient able to return proper demonstration.       Knee/Hip Exercises: Stretches   Other Knee/Hip Stretches Completed seated flexion stretch with therapy ball, 1 x 10 reps with 5 sec hold to promote lumbar stretching. Patient reports that this feels good for her low back and would like to add to HEP. PT added addition and educated on completion at home. PT also provided information on obtaining therapy ball to allow for completion at home.     Other Knee/Hip Stretches Patient reports that she does not feel that seated piriformis stretch with gentle pressure is not providing much of a  stretch. PT educated to work on slowly pulling knee up to opposite shoulder rather than gentle pressure down. With gentle pulling up to other shoulder, patient reports improved stretch to pirifiormis muscle. PT educated on proper completion and updated on HEP.                   PT Education - 05/25/20 1041    Education Details Educated on HEP alterations, Educated on scheduling to ensure compliance    Person(s) Educated Patient    Methods Explanation;Demonstration;Handout    Comprehension Verbalized understanding;Returned demonstration            PT Short Term Goals - 05/14/20 1350      PT SHORT TERM GOAL #1   Title = LTG             PT Long Term Goals - 05/14/20 1350      PT LONG TERM GOAL #1   Title Pt will demonstrate independence with updated and final land and aquatic HEP and will report compliance 3x/week    Time 4    Period Weeks    Status Revised    Target Date 06/13/20                 Plan - 05/25/20 1050    Clinical Impression Statement Today's skilled PT session focused on addressing patient's concerns with land HEP. PT reviewed and made changes based upon patient reports, including piriformis stretch and seated flexion with therapy ball. PT providing extensive education on proper scheduling and splitting up HEP to strengthening program and balance/stretching program and completing on seperate days to improve compliance and management of HEP upon discharge. Patient to complete this prior to next visit and report back.    Personal Factors and Comorbidities Comorbidity 3+    Comorbidities RA, DDD, osteoporosis, low back pain, HTN, SI joint pain, R malignant neoplasm of breast with radiation, metatarsalgia; Use LUE for blood pressure    Examination-Activity Limitations Bend;Locomotion Level;Squat;Stairs;Stand    Examination-Participation Restrictions Community Activity;Yard Work    Stability/Clinical Decision Making Stable/Uncomplicated    Rehab  Potential Good    PT Frequency 1x / week    PT Duration 4 weeks    PT Treatment/Interventions ADLs/Self Care Home Management;Aquatic Therapy;Cryotherapy;Moist Heat;Gait training;Stair training;Functional mobility training;Therapeutic activities;Therapeutic exercise;Balance training;Neuromuscular re-education;Patient/family education;Manual techniques;Passive range of motion;Dry needling    PT Next Visit Plan Review HEP? Complete balance exercises.    PT Home Exercise Plan Aquatic HEP Access Code: [email or web address]    Consulted and Agree with Plan of Care Patient           Patient will benefit from skilled therapeutic intervention in order to improve  the following deficits and impairments:  Decreased balance, Decreased range of motion, Decreased strength, Difficulty walking, Pain  Visit Diagnosis: Muscle weakness (generalized)  Unsteadiness on feet  Difficulty in walking, not elsewhere classified     Problem List Patient Active Problem List   Diagnosis Date Noted  . Pruritus of scalp 06/20/2018  . DDD (degenerative disc disease), lumbar 05/27/2018  . Essential hypertension 03/19/2017  . History of pityriasis rosea 03/19/2017  . 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 History of malignant neoplasm of breast 03/19/2017  . Eczema 03/05/2017  . Vulvar dermatitis 03/05/2017  . Rheumatoid arthritis  10/15/2016  . High risk medication use 10/15/2016  . 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 S/P lumpectomy of breast 12/03/2011  . 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[place]) 11/01/2011    Jones Bales, PT, DPT 05/25/2020, 10:52 AM  [facility] [address] [place], [place], [address] Phone: [phone]   Fax:  [phone]  Name: ZOHAL RENY MRN: [id number] Date of Birth: 04-Sep-1946

## 2020-05-25 NOTE — Patient Instructions (Addendum)
Access Code: [email or web address] URL: [email or web address] Date: 05/25/2020 Prepared by: Baldomero Lamy  Exercises Supine Bridge with [employer] Between Knees - 1 x daily - 7 x weekly - 2 sets - 10 reps Bridge with Abduction and Resistance Loop - 1 x daily - 7 x weekly - 2 sets - 10 reps Supine Lower Trunk Rotation - 1 x daily - 7 x weekly - 1 sets - 10 reps Marching with Resistance - 1 x daily - 7 x weekly - 4 sets - 10 reps Narrow Base of Support Standing Balance Eyes Closed on Foam Pad - 1 x daily - 7 x weekly - 2 sets - 10 reps Half Tandem Stance with Head Rotation on Foam Pad - 1 x daily - 7 x weekly - 2 sets - 10 reps Half Tandem Stance with Head Nods on Foam Pad - 1 x daily - 7 x weekly - 2 sets - 10 reps Heel-Toe walking with head turns and nods - 1 x daily - 7 x weekly - 4 sets - 10 reps Seated Piriformis Stretch - 1 x daily - 7 x weekly - 1 sets - 3 reps - 30 hold Seated Flexion Stretch with Swiss Ball - 1 x daily - 7 x weekly - 2 sets - 10 reps - 5 seconds hold

## 2020-06-01 ENCOUNTER — Ambulatory Visit: Payer: Medicare PPO

## 2020-06-01 ENCOUNTER — Other Ambulatory Visit: Payer: Self-pay

## 2020-06-01 DIAGNOSIS — M6281 Muscle weakness (generalized): Secondary | ICD-10-CM | POA: Diagnosis not present

## 2020-06-01 DIAGNOSIS — R262 Difficulty in walking, not elsewhere classified: Secondary | ICD-10-CM | POA: Diagnosis not present

## 2020-06-01 DIAGNOSIS — M25551 Pain in right hip: Secondary | ICD-10-CM

## 2020-06-01 DIAGNOSIS — R2681 Unsteadiness on feet: Secondary | ICD-10-CM

## 2020-06-01 NOTE — Therapy (Signed)
[facility] [address] [place], [place], [address] Phone: [phone]   Fax:  [phone]  Physical Therapy Treatment/Progress Note/Discharge Summar  Patient Details  Name: Melissa Perez MRN: [id number] Date of Birth: February 11, 1946 Referring Provider (PT): Elyn Peers, MD  PHYSICAL THERAPY DISCHARGE SUMMARY  Visits from Start of Care: 20  Current functional level related to goals / functional outcomes: See Clinical Impression Statement for details.   Remaining deficits: None   Education / Equipment: Educated on land and aquatic HEP, compliance with HEP, and continuing walking program Plan: Patient agrees to discharge.  Patient goals were met. Patient is being discharged due to meeting the stated rehab goals.  ?????       Encounter Date: 06/01/2020   PT End of Session - 06/01/20 0854    Visit Number 20    Number of Visits 21    Date for PT Re-Evaluation 06/13/20    Authorization Type [employer]; $20 copay; 10th visit PN    Progress Note Due on Visit 20    PT Start Time 0847    PT Stop Time 0925    PT Time Calculation (min) 38 min    Equipment Utilized During Treatment Gait belt    Activity Tolerance Patient tolerated treatment well    Behavior During Therapy WFL for tasks assessed/performed           Past Medical History:  Diagnosis Date  . Arthritis    rheumatoid, take Humira  . Arthritis    rheumatoid  . Arthritis pain   . Back pain   . Breast cancer ([place])   . Cancer ([place])   . History of dental surgery   . Hyperlipidemia   . Hypertension   . Personal history of radiation therapy   . Rash   . Rheumatoid arthritis ([place])   . S/P lumpectomy of breast 12/03/2011    Past Surgical History:  Procedure Laterality Date  . ABDOMINAL HYSTERECTOMY    . APPENDECTOMY    . BREAST EXCISIONAL BIOPSY Left 1991   benign  . BREAST LUMPECTOMY Right 11/2011  . BREAST SURGERY     2 left breast  biopsies-benign  . left breast biopsies  1991 & 1990    There were no vitals filed for this visit.   Subjective Assessment - 06/01/20 0852    Subjective Patient reports doing well. Believes she is much more confident in her technique with her HEP. Patient reports that splitting the exercises has helped with the management.    Pertinent History RA, DDD, osteoporosis, low back pain, HTN, SI joint pain, R malignant neoplasm of breast with radiation, metatarsalgia, Use LUE for blood pressure    Limitations Standing;Walking    How long can you walk comfortably? 40-50 minutes or 2 miles, 5 per week    Patient Stated Goals To find out what is causing the staggering, doesn't want to fall    Currently in Pain? No/denies                             [facility] Adult PT Treatment/Exercise - 06/01/20 0001      Ambulation/Gait   Ambulation/Gait Yes    Ambulation/Gait Assistance 6: Modified independent (Device/Increase time)    Ambulation/Gait Assistance Details throughout therapy gym with activities    Assistive device None    Gait Pattern Step-through pattern;Antalgic    Ambulation Surface Level;Indoor      Self-Care  Self-Care Other Self-Care Comments    Other Self-Care Comments  Educated on importance of continuining HEP upon discharge to maintain gains achieved with PT services.       Exercises   Exercises Other Exercises    Other Exercises  Completed entire review of HEP, addressing patient's concerns, and ensuring proper technique.           Completed review of the following exercises to ensure proper form and compliance upon discharge.  Access Code: [email or web address] URL: [email or web address] Date: 06/01/2020 Prepared by: Baldomero Lamy  Exercises Supine Bridge with [employer] Between Knees - 1 x daily - 7 x weekly - 2 sets - 10 reps Bridge with Abduction and Resistance Loop - 1 x daily - 7 x weekly - 2 sets - 10 reps Supine Lower Trunk Rotation - 1 x  daily - 7 x weekly - 1 sets - 10 reps Marching with Resistance - 1 x daily - 7 x weekly - 4 sets - 10 reps Narrow Base of Support Standing Balance Eyes Closed on Foam Pad - 1 x daily - 7 x weekly - 2 sets - 10 reps Half Tandem Stance with Head Rotation on Foam Pad - 1 x daily - 7 x weekly - 2 sets - 10 reps Half Tandem Stance with Head Nods on Foam Pad - 1 x daily - 7 x weekly - 2 sets - 10 reps Heel-Toe walking with head turns and nods - 1 x daily - 7 x weekly - 4 sets - 10 reps Seated Piriformis Stretch - 1 x daily - 7 x weekly - 1 sets - 3 reps - 30 hold Seated Flexion Stretch with Swiss Ball - 1 x daily - 7 x weekly - 2 sets - 10 reps - 5 seconds hold         PT Education - 06/01/20 0933    Education Details Educated on importance of continuining HEP upon discharge to maintain gains achieved with therapy services.    Person(s) Educated Patient    Methods Explanation    Comprehension Verbalized understanding            PT Short Term Goals - 05/14/20 1350      PT SHORT TERM GOAL #1   Title = LTG             PT Long Term Goals - 06/01/20 0929      PT LONG TERM GOAL #1   Title Pt will demonstrate independence with updated and final land and aquatic HEP and will report compliance 3x/week    Baseline Pt. reports independence and compliance with all HEPs    Time 4    Period Weeks    Status Achieved                 Plan - 06/01/20 0929    Clinical Impression Statement Today's PT session included entire review of patient's current HEP, and addressing concerns with form and completion at home. Patient able to demonstrate all therapeutic exercises with proper form. PT educating on continuing exercises and maintaining compliance to maintain progress made with therapy services. Patient has improved balance, improved pain in low back, reduced risk for falls, and overall reduced risk for falls. Patient demonstrates readiness to discharge, and verbalizes agreement for  discharge at today's session.    Personal Factors and Comorbidities Comorbidity 3+    Comorbidities RA, DDD, osteoporosis, low back pain, HTN, SI joint pain, R malignant neoplasm of breast with radiation,  metatarsalgia; Use LUE for blood pressure    Examination-Activity Limitations Bend;Locomotion Level;Squat;Stairs;Stand    Examination-Participation Restrictions Community Activity;Yard Work    Stability/Clinical Decision Making Stable/Uncomplicated    Rehab Potential Good    PT Frequency 1x / week    PT Duration 4 weeks    PT Treatment/Interventions ADLs/Self Care Home Management;Aquatic Therapy;Cryotherapy;Moist Heat;Gait training;Stair training;Functional mobility training;Therapeutic activities;Therapeutic exercise;Balance training;Neuromuscular re-education;Patient/family education;Manual techniques;Passive range of motion;Dry needling    PT Home Exercise Plan Aquatic HEP Access Code: [email or web address]    Consulted and Agree with Plan of Care Patient           Patient will benefit from skilled therapeutic intervention in order to improve the following deficits and impairments:  Decreased balance, Decreased range of motion, Decreased strength, Difficulty walking, Pain  Visit Diagnosis: Muscle weakness (generalized)  Unsteadiness on feet  Difficulty in walking, not elsewhere classified  Pain in right hip     Problem List Patient Active Problem List   Diagnosis Date Noted  . Pruritus of scalp 06/20/2018  . DDD (degenerative disc disease), lumbar 05/27/2018  . Essential hypertension 03/19/2017  . History of pityriasis rosea 03/19/2017  . 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 History of malignant neoplasm of breast 03/19/2017  . Eczema 03/05/2017  . Vulvar dermatitis 03/05/2017  . Rheumatoid arthritis  10/15/2016  . High risk medication use 10/15/2016  . 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 S/P lumpectomy of breast 12/03/2011  . 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 [place], [place], [address] Phone: [phone]   Fax:  [phone]  Name: NICOL HERBIG MRN: [id number] Date of Birth: January 27, 1946

## 2020-06-08 ENCOUNTER — Ambulatory Visit: Payer: Medicare PPO

## 2020-06-16 DIAGNOSIS — I10 Essential (primary) hypertension: Secondary | ICD-10-CM | POA: Diagnosis not present

## 2020-06-16 DIAGNOSIS — R7301 Impaired fasting glucose: Secondary | ICD-10-CM | POA: Diagnosis not present

## 2020-06-16 DIAGNOSIS — E669 Obesity, unspecified: Secondary | ICD-10-CM | POA: Diagnosis not present

## 2020-06-16 DIAGNOSIS — E785 Hyperlipidemia, unspecified: Secondary | ICD-10-CM | POA: Diagnosis not present

## 2020-06-29 ENCOUNTER — Other Ambulatory Visit: Payer: Self-pay | Admitting: *Deleted

## 2020-06-29 ENCOUNTER — Telehealth: Payer: Self-pay | Admitting: Rheumatology

## 2020-06-29 DIAGNOSIS — Z79899 Other long term (current) drug therapy: Secondary | ICD-10-CM

## 2020-06-29 NOTE — Telephone Encounter (Signed)
Lab Orders released.  

## 2020-06-29 NOTE — Telephone Encounter (Signed)
Patient called requesting labwork orders be sent to [facility] on [address].  Patient states she will be going tomorrow, 06/30/20.

## 2020-06-30 DIAGNOSIS — Z79899 Other long term (current) drug therapy: Secondary | ICD-10-CM | POA: Diagnosis not present

## 2020-06-30 LAB — CBC WITH DIFFERENTIAL/PLATELET
Absolute Monocytes: 510 cells/uL (ref 200–950)
Basophils Absolute: 62 cells/uL (ref 0–200)
Basophils Relative: 1.1 %
Eosinophils Absolute: 118 cells/uL (ref 15–500)
Eosinophils Relative: 2.1 %
HCT: 34.4 % — ABNORMAL LOW (ref 35.0–45.0)
Hemoglobin: 11.8 g/dL (ref 11.7–15.5)
Lymphs Abs: 1826 cells/uL (ref 850–3900)
MCH: 35.4 pg — ABNORMAL HIGH (ref 27.0–33.0)
MCHC: 34.3 g/dL (ref 32.0–36.0)
MCV: 103.3 fL — ABNORMAL HIGH (ref 80.0–100.0)
MPV: 10.4 fL (ref 7.5–12.5)
Monocytes Relative: 9.1 %
Neutro Abs: 3086 cells/uL (ref 1500–7800)
Neutrophils Relative %: 55.1 %
Platelets: 206 10*3/uL (ref 140–400)
RBC: 3.33 10*6/uL — ABNORMAL LOW (ref 3.80–5.10)
RDW: 13.9 % (ref 11.0–15.0)
Total Lymphocyte: 32.6 %
WBC: 5.6 10*3/uL (ref 3.8–10.8)

## 2020-06-30 LAB — COMPLETE METABOLIC PANEL WITH GFR
AG Ratio: 2.1 (calc) (ref 1.0–2.5)
ALT: 22 U/L (ref 6–29)
AST: 28 U/L (ref 10–35)
Albumin: 4.1 g/dL (ref 3.6–5.1)
Alkaline phosphatase (APISO): 65 U/L (ref 37–153)
BUN: 12 mg/dL (ref 7–25)
CO2: 29 mmol/L (ref 20–32)
Calcium: 9.6 mg/dL (ref 8.6–10.4)
Chloride: 105 mmol/L (ref 98–110)
Creat: 0.88 mg/dL (ref 0.60–0.93)
GFR, Est African American: 76 mL/min/{1.73_m2} (ref >=60)
GFR, Est Non African American: 66 mL/min/{1.73_m2} (ref >=60)
Globulin: 2 g/dL (calc) (ref 1.9–3.7)
Glucose, Bld: 91 mg/dL (ref 65–99)
Potassium: 4 mmol/L (ref 3.5–5.3)
Sodium: 143 mmol/L (ref 135–146)
Total Bilirubin: 0.3 mg/dL (ref 0.2–1.2)
Total Protein: 6.1 g/dL (ref 6.1–8.1)

## 2020-07-01 NOTE — Progress Notes (Signed)
CBC and CMP are normal.

## 2020-07-11 NOTE — Progress Notes (Deleted)
Office Visit Note  Patient: Melissa Perez             Date of Birth: Dec 27, 1945           MRN: [id number]             PCP: Lucianne Lei, MD Referring: Lucianne Lei, MD Visit Date: 07/25/2020 Occupation: @GUAROCC @  Subjective:  No chief complaint on file.   History of Present Illness: Melissa Perez is a 74 y.o. female ***   Activities of Daily Living:  Patient reports morning stiffness for *** {minute/hour:19697}.   Patient {ACTIONS;DENIES/REPORTS:21021675::"Denies"} nocturnal pain.  Difficulty dressing/grooming: {ACTIONS;DENIES/REPORTS:21021675::"Denies"} Difficulty climbing stairs: {ACTIONS;DENIES/REPORTS:21021675::"Denies"} Difficulty getting out of chair: {ACTIONS;DENIES/REPORTS:21021675::"Denies"} Difficulty using hands for taps, buttons, cutlery, and/or writing: {ACTIONS;DENIES/REPORTS:21021675::"Denies"}  No Rheumatology ROS completed.   PMFS History:  Patient Active Problem List   Diagnosis Date Noted  . Pruritus of scalp 06/20/2018  . DDD (degenerative disc disease), lumbar 05/27/2018  . Essential hypertension 03/19/2017  . History of pityriasis rosea 03/19/2017  . 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 History of malignant neoplasm of breast 03/19/2017  . Eczema 03/05/2017  . Vulvar dermatitis 03/05/2017  . Rheumatoid arthritis  10/15/2016  . High risk medication use 10/15/2016  . 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 S/P lumpectomy of breast 12/03/2011  . 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[place]) 11/01/2011    Past Medical History:  Diagnosis Date  . Arthritis    rheumatoid, take Humira  . Arthritis    rheumatoid  . Arthritis pain   . Back pain   . Breast cancer ([place])   . Cancer ([place])   . History of dental surgery   . Hyperlipidemia   . Hypertension   . Personal history of radiation therapy   . Rash   . Rheumatoid arthritis ([place])   . S/P lumpectomy of breast 12/03/2011    Family  History  Problem Relation Age of Onset  . Breast cancer Mother   . Cancer Mother   . Kidney cancer Father   . Cancer Father   . Breast cancer Sister   . Cancer Sister   . Lung cancer Sister   . Colon cancer Maternal Uncle   . Liver cancer Paternal Aunt    Past Surgical History:  Procedure Laterality Date  . ABDOMINAL HYSTERECTOMY    . APPENDECTOMY    . BREAST EXCISIONAL BIOPSY Left 1991   benign  . BREAST LUMPECTOMY Right 11/2011  . BREAST SURGERY     2 left breast biopsies-benign  . left breast biopsies  [place]   Social History   Social History Narrative   Adult Son in the [employer] History  Administered Date(s) Administered  . Influenza, High Dose Seasonal PF 09/10/2017  . Influenza-Unspecified 07/03/2018  . PFIZER SARS-COV-2 Vaccination 01/14/2020, 02/06/2020  . Zoster 11/24/2012     Objective: Vital Signs: There were no vitals taken for this visit.   Physical Exam   Musculoskeletal Exam: ***  CDAI Exam: CDAI Score: -- Patient Global: --; Provider Global: -- Swollen: --; Tender: -- Joint Exam 07/25/2020   No joint exam has been documented for this visit   There is currently no information documented on the homunculus. Go to the Rheumatology activity and complete the homunculus joint exam.  Investigation: No additional findings.  Imaging: No results found.  Recent Labs: Lab Results  Component Value Date   WBC 5.6 06/30/2020  HGB 11.8 06/30/2020   PLT 206 06/30/2020   NA 143 06/30/2020   K 4.0 06/30/2020   CL 105 06/30/2020   CO2 29 06/30/2020   GLUCOSE 91 06/30/2020   BUN 12 06/30/2020   CREATININE 0.88 06/30/2020   BILITOT 0.3 06/30/2020   ALKPHOS 53 06/20/2017   AST 28 06/30/2020   ALT 22 06/30/2020   PROT 6.1 06/30/2020   ALBUMIN 3.8 06/20/2017   CALCIUM 9.6 06/30/2020   GFRAA 76 06/30/2020    Speciality Comments: PLQ Eye Exam:02/11/2020 @ [facility] [place]. Follow up in 6 months.  Procedures:  No  procedures performed Allergies: Dilaudid [hydromorphone hcl] and Hydrocodone   Assessment / Plan:     Visit Diagnoses: No diagnosis found.  Orders: No orders of the defined types were placed in this encounter.  No orders of the defined types were placed in this encounter.   Face-to-face time spent with patient was *** minutes. Greater than 50% of time was spent in counseling and coordination of care.  Follow-Up Instructions: No follow-ups on file.   Earnestine Mealing, CMA  Note - This record has been created using Editor, commissioning.  Chart creation errors have been sought, but may not always  have been located. Such creation errors do not reflect on  the standard of medical care.

## 2020-07-14 ENCOUNTER — Other Ambulatory Visit: Payer: Self-pay | Admitting: Rheumatology

## 2020-07-14 NOTE — Telephone Encounter (Signed)
Last Visit: 02/24/2020 Next Visit: 07/25/2020 Labs: 06/30/2020 CBC and CMP are normal.  Current Dose per office note on 02/24/2020: Methotrexate 2.5 mg 7 tablets every 7 days  Okay to refill per Dr. Estanislado Pandy

## 2020-07-20 NOTE — Progress Notes (Signed)
Office Visit Note  Patient: Melissa Perez             Date of Birth: 08/31/1946           MRN: [id number]             PCP: Lucianne Lei, MD Referring: Lucianne Lei, MD Visit Date: 07/29/2020 Occupation: @GUAROCC @  Subjective:  Right knee and left ankle joint pain  History of Present Illness: Melissa Perez is a 74 y.o. female with history of seropositive rheumatoid arthritis, DDD, and osteoporosis.  She is taking methotrexate 7 tablets by mouth once weekly, folic acid 2 mg by mouth daily, Plaquenil 200 mg 1 tablet daily.  She has not missed any doses of methotrexate or Plaquenil recently.  She denies any recent rheumatoid arthritis flares.  She states that on 07/09/2020 she fell after missing a step and landed face first with outstretched forearms.  She states that she injured her right knee and left ankle during the fall.  She applied ice and rested for several days and her discomfort has started to improve.  She continues have persistent discomfort in the left ankle joint and has noticed some joint swelling.  She states that she has started to walk again for exercise but is concerned that her discomfort has been persistent.   Activities of Daily Living:  Patient reports morning stiffness for 5  minutes.   Patient Denies nocturnal pain.  Difficulty dressing/grooming: Denies Difficulty climbing stairs: Denies Difficulty getting out of chair: Denies Difficulty using hands for taps, buttons, cutlery, and/or writing: Denies  Review of Systems  Constitutional: Positive for fatigue.  HENT: Negative for mouth sores, mouth dryness and nose dryness.   Eyes: Negative for pain, visual disturbance and dryness.  Respiratory: Negative for cough, hemoptysis, shortness of breath and difficulty breathing.   Cardiovascular: Negative for chest pain, palpitations, hypertension and swelling in legs/feet.  Gastrointestinal: Negative for blood in stool, constipation and diarrhea.  Endocrine: Negative for  increased urination.  Genitourinary: Negative for painful urination.  Musculoskeletal: Positive for arthralgias, joint pain and morning stiffness. Negative for joint swelling, myalgias, muscle weakness, muscle tenderness and myalgias.  Skin: Positive for rash. Negative for color change, pallor, hair loss, nodules/bumps, skin tightness, ulcers and sensitivity to sunlight.  Allergic/Immunologic: Negative for susceptible to infections.  Neurological: Negative for dizziness, numbness, headaches and weakness.  Hematological: Negative for swollen glands.  Psychiatric/Behavioral: Negative for depressed mood and sleep disturbance. The patient is not nervous/anxious.     PMFS History:  Patient Active Problem List   Diagnosis Date Noted  . Pruritus of scalp 06/20/2018  . DDD (degenerative disc disease), lumbar 05/27/2018  . Essential hypertension 03/19/2017  . History of pityriasis rosea 03/19/2017  . 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 History of malignant neoplasm of breast 03/19/2017  . Eczema 03/05/2017  . Vulvar dermatitis 03/05/2017  . Rheumatoid arthritis  10/15/2016  . High risk medication use 10/15/2016  . 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 S/P lumpectomy of breast 12/03/2011  . 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[place]) 11/01/2011    Past Medical History:  Diagnosis Date  . Arthritis    rheumatoid, take Humira  . Arthritis    rheumatoid  . Arthritis pain   . Back pain   . Breast cancer ([place])   . Cancer ([place])   . History of dental surgery   . Hyperlipidemia   . Hypertension   .  Personal history of radiation therapy   . Rash   . Rheumatoid arthritis ([place])   . S/P lumpectomy of breast 12/03/2011    Family History  Problem Relation Age of Onset  . Breast cancer Mother   . Cancer Mother   . Kidney cancer Father   . Cancer Father   . Breast cancer Sister   . Cancer Sister   . Lung cancer Sister   .  Colon cancer Maternal Uncle   . Liver cancer Paternal Aunt    Past Surgical History:  Procedure Laterality Date  . ABDOMINAL HYSTERECTOMY    . APPENDECTOMY    . BREAST EXCISIONAL BIOPSY Left 1991   benign  . BREAST LUMPECTOMY Right 11/2011  . BREAST SURGERY     2 left breast biopsies-benign  . left breast biopsies  [place]   Social History   Social History Narrative   Adult Son in the [employer] History  Administered Date(s) Administered  . Influenza, High Dose Seasonal PF 09/10/2017  . Influenza-Unspecified 07/03/2018  . PFIZER SARS-COV-2 Vaccination 01/14/2020, 02/06/2020  . Zoster 11/24/2012     Objective: Vital Signs: BP 138/60 (BP Location: Left Arm, Patient Position: Sitting, Cuff Size: Normal)   Pulse 73   Resp 14   Ht 5' 1.75" (1.568 m)   Wt 144 lb (65.3 kg)   BMI 26.55 kg/m    Physical Exam Vitals and nursing note reviewed.  Constitutional:      Appearance: She is well-developed.  HENT:     Head: Normocephalic and atraumatic.  Eyes:     Conjunctiva/sclera: Conjunctivae normal.  Pulmonary:     Effort: Pulmonary effort is normal.  Abdominal:     Palpations: Abdomen is soft.  Musculoskeletal:     Cervical back: Normal range of motion.  Skin:    General: Skin is warm and dry.     Capillary Refill: Capillary refill takes less than 2 seconds.  Neurological:     Mental Status: She is alert and oriented to person, place, and time.  Psychiatric:        Behavior: Behavior normal.      Musculoskeletal Exam: C-spine, thoracic spine, and lumbar spine good ROM.  Shoulder joints, elbow joints, wrist joints, MCPs, PIPs, and DIPs have good range of motion with no synovitis.  She has complete fist formation.  Knee joints have good range of motion with no warmth or effusion. Ankle joints good ROM with no tenderness or inflammation.    CDAI Exam: CDAI Score: -- Patient Global: --; Provider Global: -- Swollen: --; Tender: -- Joint Exam 07/29/2020    No joint exam has been documented for this visit   There is currently no information documented on the homunculus. Go to the Rheumatology activity and complete the homunculus joint exam.  Investigation: No additional findings.  Imaging: No results found.  Recent Labs: Lab Results  Component Value Date   WBC 5.6 06/30/2020   HGB 11.8 06/30/2020   PLT 206 06/30/2020   NA 143 06/30/2020   K 4.0 06/30/2020   CL 105 06/30/2020   CO2 29 06/30/2020   GLUCOSE 91 06/30/2020   BUN 12 06/30/2020   CREATININE 0.88 06/30/2020   BILITOT 0.3 06/30/2020   ALKPHOS 53 06/20/2017   AST 28 06/30/2020   ALT 22 06/30/2020   PROT 6.1 06/30/2020   ALBUMIN 3.8 06/20/2017   CALCIUM 9.6 06/30/2020   GFRAA 76 06/30/2020    Speciality Comments: PLQ Eye Exam:02/11/2020 @  [facility]. Follow up in 6 months.  Procedures:  No procedures performed Allergies: Dilaudid [hydromorphone hcl] and Hydrocodone   Assessment / Plan:     Visit Diagnoses: Rheumatoid arthritis - +RF, +CCP: She has no tenderness or synovitis on exam.  She has not had any recent rheumatoid arthritis flares.  She is clinically doing well on methotrexate 7 tablets by mouth once weekly, folic acid 2 mg by mouth daily, Plaquenil 200 mg 1 tablet daily.  She is tolerating both medications without any side effects.  She has not missed any doses of these medications recently.  She fell on 07/09/2020 and has had persistent discomfort in the right knee and left ankle joint.  X-rays of the right knee and left ankle were obtained today.  She is not experiencing any other joint pain or inflammation at this time.  She will continue on methotrexate and Plaquenil as prescribed.  She does not need any refills at this time.  She was advised to notify us if she develops increased joint pain or joint swelling.  She will follow-up in the office in 5 months.  High risk medication use - Methotrexate 2.5 mg 7 tablets every 7 days, folic acid 1  mg 2 tablets daily,and Plaquenil 200 mg 1 tablet daily.  PLQ Eye Exam:02/11/2020 @ [facility] [place].  CBC and CMP were drawn on 06/30/2020.  She will be due to update lab work in October and every 3 months to monitor for drug toxicity. She has received both COVID-19 vaccinations and was in.  She received a third dose.  She was advised to hold methotrexate 1 week after receiving her third vaccine.  She was advised to notify us or her PCP if she develops the COVID-19 infection in order to receive the antibody infusion.  She was encouraged to continue to wear a mask and social distance.  She is advised to hold methotrexate if she develops signs or symptoms of infection and to resume once the infection has completely cleared.  She voiced understanding.  DDD (degenerative disc disease), lumbar: She is not experiencing any discomfort in her lower back at this time.  She has no symptoms of radiculopathy.  Chronic right SI joint pain: She is not experiencing any SI joint pain at this time.  Age-related osteoporosis without current pathological fracture: DEXA ordered by Dr. Criss Rosales on 06/06/2020 AP spine BMD 0.871 with T score -2.5.  She receives Prolia 60 mg subcutaneous injections every 6 months.  She was advised to call Dr. Fransico Setters office in order to update her bone density and figure out when her next Prolia injection is due  Morton's neuroma, unspecified laterality: No discomfort at this time.   Pain in left ankle and joints of left foot -She presents today after fall on 07/09/2020.  She injured her left ankle after missing a step.  She has tenderness and mild inflammation on the lateral aspect of the left ankle and on the dorsal aspect of the left foot.  X-rays of the left ankle were obtained today.  Plan: XR Ankle Complete Left  Chronic pain of right knee -She landed on her right knee after falling on 07/09/2020.  She continues to have persistent discomfort.  No warmth or effusion was noted on exam  today.  She has good range of motion of the right knee.  She requested an x-ray of the right knee joint today. plan: XR KNEE 3 VIEW RIGHT  Other medical conditions are listed as follows:  Positive PPD, treated  History of malignant neoplasm of breast  History of pityriasis rosea  History of hypertension    Orders: Orders Placed This Encounter  Procedures  . XR Ankle Complete Left  . XR KNEE 3 VIEW RIGHT   No orders of the defined types were placed in this encounter.   Face-to-face time spent with patient was 30 minutes. Greater than 50% of time was spent in counseling and coordination of care.  Follow-Up Instructions: Return in about 5 months (around 12/29/2020) for Rheumatoid arthritis, DDD.   Ofilia Neas, PA-C  Note - This record has been created using Dragon software.  Chart creation errors have been sought, but may not always  have been located. Such creation errors do not reflect on  the standard of medical care.

## 2020-07-21 ENCOUNTER — Encounter: Payer: Self-pay | Admitting: Genetic Counselor

## 2020-07-25 ENCOUNTER — Ambulatory Visit: Payer: Medicare PPO | Admitting: Physician Assistant

## 2020-07-29 ENCOUNTER — Ambulatory Visit: Payer: Self-pay

## 2020-07-29 ENCOUNTER — Ambulatory Visit: Payer: Medicare PPO | Admitting: Physician Assistant

## 2020-07-29 ENCOUNTER — Other Ambulatory Visit: Payer: Self-pay

## 2020-07-29 ENCOUNTER — Encounter: Payer: Self-pay | Admitting: Physician Assistant

## 2020-07-29 VITALS — BP 138/60 | HR 73 | Resp 14 | Ht 61.75 in | Wt 144.0 lb

## 2020-07-29 DIAGNOSIS — M25561 Pain in right knee: Secondary | ICD-10-CM

## 2020-07-29 DIAGNOSIS — M5136 Other intervertebral disc degeneration, lumbar region: Secondary | ICD-10-CM

## 2020-07-29 DIAGNOSIS — Z79899 Other long term (current) drug therapy: Secondary | ICD-10-CM

## 2020-07-29 DIAGNOSIS — M0579 Rheumatoid arthritis with rheumatoid factor of multiple sites without organ or systems involvement: Secondary | ICD-10-CM | POA: Diagnosis not present

## 2020-07-29 DIAGNOSIS — Z8679 Personal history of other diseases of the circulatory system: Secondary | ICD-10-CM

## 2020-07-29 DIAGNOSIS — G8929 Other chronic pain: Secondary | ICD-10-CM | POA: Diagnosis not present

## 2020-07-29 DIAGNOSIS — R7611 Nonspecific reaction to tuberculin skin test without active tuberculosis: Secondary | ICD-10-CM | POA: Diagnosis not present

## 2020-07-29 DIAGNOSIS — M25572 Pain in left ankle and joints of left foot: Secondary | ICD-10-CM

## 2020-07-29 DIAGNOSIS — G5760 Lesion of plantar nerve, unspecified lower limb: Secondary | ICD-10-CM | POA: Diagnosis not present

## 2020-07-29 DIAGNOSIS — M810 Age-related osteoporosis without current pathological fracture: Secondary | ICD-10-CM | POA: Diagnosis not present

## 2020-07-29 DIAGNOSIS — M533 Sacrococcygeal disorders, not elsewhere classified: Secondary | ICD-10-CM

## 2020-07-29 DIAGNOSIS — Z872 Personal history of diseases of the skin and subcutaneous tissue: Secondary | ICD-10-CM

## 2020-07-29 DIAGNOSIS — Z853 Personal history of malignant neoplasm of breast: Secondary | ICD-10-CM

## 2020-07-29 NOTE — Patient Instructions (Addendum)
COVID-19 vaccine recommendations:   COVID-19 vaccine is recommended for everyone (unless you are allergic to a vaccine component), even if you are on a medication that suppresses your immune system.   If you are on Methotrexate, Cellcept (mycophenolate), Rinvoq, Morrie Sheldon, and Olumiant- hold the medication for 1 week after each vaccine. Hold Methotrexate for 2 weeks after the single dose COVID-19 vaccine.   If you are on Orencia subcutaneous injection - hold medication one week prior to and one week after the first COVID-19 vaccine dose (only).   If you are on Orencia IV infusions- time vaccination administration so that the first COVID-19 vaccination will occur four weeks after the infusion and postpone the subsequent infusion by one week.   If you are on Cyclophosphamide or Rituxan infusions please contact your doctor prior to receiving the COVID-19 vaccine.   Do not take Tylenol or any anti-inflammatory medications (NSAIDs) 24 hours prior to the COVID-19 vaccination.   There is no direct evidence about the efficacy of the COVID-19 vaccine in individuals who are on medications that suppress the immune system.   Even if you are fully vaccinated, and you are on any medications that suppress your immune system, please continue to wear a mask, maintain at least six feet social distance and practice hand hygiene.   If you develop a COVID-19 infection, please contact your PCP or our office to determine if you need antibody infusion.  The booster vaccine is now available for immunocompromised patients. It is advised that if you had Pfizer vaccine you should get Coca[employer]a booster.  If you had a Moderna vaccine then you should get a Moderna booster. Johnson and Melissa Perez does not have a booster vaccine at this time.  Please see the following web sites for updated information.    [email or web address]://[email or web address]/[email or web address]/[email or web address]-Vaccination-Patient-Resources.[email or web address]  [email or web address]/ID/[email or web address]    Standing Labs We placed an order today for your standing lab work.   Please have your standing labs drawn in October and every 3 months   If possible, please have your labs drawn 2 weeks prior to your appointment so that the provider can discuss your results at your appointment.  We have open lab daily Monday through Thursday from 8:30-12:30 PM and 1:30-4:30 PM and Friday from 8:30-12:30 PM and 1:30-4:00 PM at the office of Dr. Bo Merino, [facility].   Please be advised, patients with office appointments requiring lab work will take precedents over walk-in lab work.  If possible, please come for your lab work on Monday and Friday afternoons, as you may experience shorter wait times. The office is located at [address], [place], [place], [place] [address] No appointment is necessary.   Labs are drawn by [facility]. Please bring your co-pay at the time of your lab draw.  You may receive a bill from [facility] for your lab work.  If you wish to have your labs drawn at another location, please call the office 24 hours in advance to send orders.  If you have any questions regarding directions or hours of operation,  please call [phone].   As a reminder, please drink plenty of water prior to coming for your lab work. Thanks!

## 2020-08-02 ENCOUNTER — Other Ambulatory Visit: Payer: Self-pay | Admitting: Rheumatology

## 2020-08-02 DIAGNOSIS — M0579 Rheumatoid arthritis with rheumatoid factor of multiple sites without organ or systems involvement: Secondary | ICD-10-CM

## 2020-08-02 NOTE — Telephone Encounter (Signed)
Last Visit: 07/29/2020 Next Visit: 12/30/2020 Labs: 06/30/2020 CBC and CMP are normal Eye exam: 02/11/2020   Current Dose per office note 07/29/2020: Plaquenil 200 mg 1 tablet daily DX: Rheumatoid arthritis   Okay to refill per Dr. Estanislado Pandy

## 2020-08-16 ENCOUNTER — Telehealth: Payer: Self-pay | Admitting: Rheumatology

## 2020-08-16 NOTE — Telephone Encounter (Signed)
Patient calling in reference to right knee. Pain continues to get worse, knee swollen, red an hot to the touch. Please call to advise.

## 2020-08-16 NOTE — Telephone Encounter (Signed)
Patient scheduled for 08/17/2020 at 8:40 am for evaluation .

## 2020-08-16 NOTE — Progress Notes (Signed)
Office Visit Note  Patient: Melissa Perez             Date of Birth: 03/14/1946           MRN: [id number]             PCP: Lucianne Lei, MD Referring: Lucianne Lei, MD Visit Date: 08/17/2020 Occupation: @GUAROCC @  Subjective:  Right knee joint pain   History of Present Illness: Melissa Perez is a 74 y.o. female with history of rheumatoid arthritis, DDD, and osteoporosis.  Patient is on methotrexate 7 tablets by mouth once weekly, folic acid 2 mg by mouth daily, Plaquenil 200 mg 1 tablet by mouth daily.  She denies any recent rheumatoid arthritis flares.  She states that on 07/09/2020 she fell after missing a step while at a wedding and landed on her outstretched arms and her right knee.  At her last office visit on 07/29/2020 she had an x-ray of the right knee joint and left ankle.  She continues to have persistent discomfort in the right knee and left ankle.  Her left ankle joint has been improving.  She has persistent pain and inflammation in her right knee.  She has noticed swelling in the back of her knee and has had difficulty with flexion extension.  She is also noticed a buckling sensation and instability in her right knee and has been concerned about falling.  She lives alone so she is very worried that if she falls she will be unable to get up.  She has tried taking Tylenol as well as performing knee exercises without much relief.  She has tried returning to her normal walking regimen but has had some difficulty. She received her 3rd covid vaccine on 08/05/20.  Activities of Daily Living:  Patient reports morning stiffness for 1 minute.   Patient Denies nocturnal pain.  Difficulty dressing/grooming: Reports Difficulty climbing stairs: Reports Difficulty getting out of chair: Reports Difficulty using hands for taps, buttons, cutlery, and/or writing: Reports  Review of Systems  Constitutional: Positive for fatigue.  HENT: Negative for mouth dryness.   Eyes: Negative for dryness.    Respiratory: Negative for shortness of breath.   Cardiovascular: Negative for swelling in legs/feet.  Gastrointestinal: Negative for constipation.  Endocrine: Positive for heat intolerance and increased urination.  Genitourinary: Negative for difficulty urinating.  Musculoskeletal: Positive for arthralgias, gait problem, joint pain, joint swelling, muscle weakness and morning stiffness.  Skin: Positive for rash.  Allergic/Immunologic: Positive for susceptible to infections.  Neurological: Positive for weakness.  Hematological: Positive for bruising/bleeding tendency.  Psychiatric/Behavioral: Positive for sleep disturbance.    PMFS History:  Patient Active Problem List   Diagnosis Date Noted  . Pruritus of scalp 06/20/2018  . DDD (degenerative disc disease), lumbar 05/27/2018  . Essential hypertension 03/19/2017  . History of pityriasis rosea 03/19/2017  . 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 History of malignant neoplasm of breast 03/19/2017  . Eczema 03/05/2017  . Vulvar dermatitis 03/05/2017  . Rheumatoid arthritis  10/15/2016  . High risk medication use 10/15/2016  . 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 S/P lumpectomy of breast 12/03/2011  . 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[place]) 11/01/2011    Past Medical History:  Diagnosis Date  . Arthritis    rheumatoid, take Humira  . Arthritis    rheumatoid  . Arthritis pain   . Back pain   . Breast cancer ([place])   .  Cancer ([place])   . History of dental surgery   . Hyperlipidemia   . Hypertension   . Personal history of radiation therapy   . Rash   . Rheumatoid arthritis ([place])   . S/P lumpectomy of breast 12/03/2011    Family History  Problem Relation Age of Onset  . Breast cancer Mother   . Cancer Mother   . Kidney cancer Father   . Cancer Father   . Breast cancer Sister   . Cancer Sister   . Lung cancer Sister   . Colon cancer Maternal Uncle   .  Liver cancer Paternal Aunt    Past Surgical History:  Procedure Laterality Date  . ABDOMINAL HYSTERECTOMY    . APPENDECTOMY    . BREAST EXCISIONAL BIOPSY Left 1991   benign  . BREAST LUMPECTOMY Right 11/2011  . BREAST SURGERY     2 left breast biopsies-benign  . left breast biopsies  [place]   Social History   Social History Narrative   Adult Son in the [employer] History  Administered Date(s) Administered  . Influenza, High Dose Seasonal PF 09/10/2017  . Influenza-Unspecified 07/03/2018  . PFIZER SARS-COV-2 Vaccination 01/14/2020, 02/06/2020, 08/05/2020  . Zoster 11/24/2012     Objective: Vital Signs: BP 123/65 (BP Location: Left Arm, Patient Position: Sitting, Cuff Size: Normal)   Pulse (!) 101   Resp 16   Ht 5' 1.75" (1.568 m)   Wt 143 lb (64.9 kg)   BMI 26.37 kg/m    Physical Exam Vitals and nursing note reviewed.  Constitutional:      Appearance: She is well-developed.  HENT:     Head: Normocephalic and atraumatic.  Eyes:     Conjunctiva/sclera: Conjunctivae normal.  Pulmonary:     Effort: Pulmonary effort is normal.  Abdominal:     Palpations: Abdomen is soft.  Musculoskeletal:     Cervical back: Normal range of motion.  Skin:    General: Skin is warm and dry.     Capillary Refill: Capillary refill takes less than 2 seconds.  Neurological:     Mental Status: She is alert and oriented to person, place, and time.  Psychiatric:        Behavior: Behavior normal.      Musculoskeletal Exam: C-spine, thoracic spine, lumbar spine have good range of motion.  Shoulder joints, elbow joints, wrist joints, MCPs, PIPs, DIPs have good range of motion with no synovitis.  She has complete fist formation bilaterally  Hip joints have good range of motion with no discomfort.  Right knee swelling no warmth noted.  She has painful flexion extension of the right knee.  Left knee has good range of motion with no warmth or effusion.  Right ankle has good range of  motion no tenderness or inflammation.  She has good range of motion of left ankle with some tenderness on the lateral aspect.  No MTP joint tenderness.   CDAI Exam: CDAI Score: -- Patient Global: --; Provider Global: -- Swollen: --; Tender: -- Joint Exam 08/17/2020   No joint exam has been documented for this visit   There is currently no information documented on the homunculus. Go to the Rheumatology activity and complete the homunculus joint exam.  Investigation: No additional findings.  Imaging: XR Ankle Complete Left  Result Date: 07/29/2020 No tibiotalar or subtalar joint space narrowing was noted.  No fracture was noted.  Posterior calcaneal spur was noted. Impression: Unremarkable x-ray of the ankle joint.  XR KNEE 3 VIEW RIGHT  Result Date: 07/29/2020 Moderate medial compartment narrowing with medial osteophytes was noted.  Moderate patellofemoral narrowing was noted.  No chondrocalcinosis was noted. Impression: These findings are consistent moderate osteoarthritis and moderate chondromalacia patella.   Recent Labs: Lab Results  Component Value Date   WBC 5.6 06/30/2020   HGB 11.8 06/30/2020   PLT 206 06/30/2020   NA 143 06/30/2020   K 4.0 06/30/2020   CL 105 06/30/2020   CO2 29 06/30/2020   GLUCOSE 91 06/30/2020   BUN 12 06/30/2020   CREATININE 0.88 06/30/2020   BILITOT 0.3 06/30/2020   ALKPHOS 53 06/20/2017   AST 28 06/30/2020   ALT 22 06/30/2020   PROT 6.1 06/30/2020   ALBUMIN 3.8 06/20/2017   CALCIUM 9.6 06/30/2020   GFRAA 76 06/30/2020    Speciality Comments: PLQ Eye Exam:02/11/2020 @ [facility] [place]. Follow up in 6 months.  Procedures:  Large Joint Inj: R knee on 08/17/2020 9:14 AM Indications: pain Details: 27 G 1.5 in needle, medial approach  Arthrogram: No  Medications: 1.5 mL lidocaine 1 %; 40 mg triamcinolone acetonide 40 MG/ML Aspirate: 0 mL Outcome: tolerated well, no immediate complications Procedure, treatment  alternatives, risks and benefits explained, specific risks discussed. Consent was given by the patient. Immediately prior to procedure a time out was called to verify the correct patient, procedure, equipment, support staff and site/side marked as required. Patient was prepped and draped in the usual sterile fashion.     Allergies: Dilaudid [hydromorphone hcl] and Hydrocodone   Assessment / Plan:     Visit Diagnoses: Rheumatoid arthritis - +RF, +CCP: She has not had any recent rheumatoid arthritis flares.  Her rheumatoid redness has been well controlled taking methotrexate 7 tablets by mouth once weekly, folic acid 2 mg a mouth daily, Plaquenil 200 mg 1 tablet by mouth daily.  She presents today with ongoing discomfort in her right knee and left ankle after a fall on 07/09/2020.  She has swelling and painful and limited flexion extension of the right knee on exam.  The right knee joint was injected with cortisone.  We will proceed with an MRI of the right knee due to the instability she has been experiencing.  She has some tenderness on the lateral aspect of the left ankle but has good range of motion with no warmth or swelling.  She is not experiencing any other joint pain or inflammation at this time.  She will continue taking methotrexate and Plaquenil as prescribed.  She does not need a refill at this time.  She will follow-up in the office in 4 weeks.   High risk medication use - Methotrexate 2.5 mg 7 tablets every 7 days, folic acid 1 mg 2 tablets daily,and Plaquenil 200 mg 1 tablet daily.  PLQ Eye Exam:02/11/2020 @ [facility] [place].  CBC and CMP were drawn on 06/30/2020.  She will be due to update lab work in October and every 3 months to monitor for drug toxicity.  Standing orders for CBC and CMP are in place. She has received all 3 doses of the COVID-19 vaccination.  We discussed importance of continuing to wear a mask and social distance.  She was advised to notify us if she develops a  COVID-19 infection in order to receive the antibody infusion.  Primary osteoarthritis of right knee: The patient had a fall on 07/09/2020 and landed on both outstretched arms as well as her right knee.  She has had  persistent discomfort since then.  X-rays of the right knee were obtained on 07/29/2020 at her last office visit which revealed moderate osteoarthritis and moderate chondromalacia patella.  Her pain is progressively been worsening over the past 3 days.  She has had more limited and painful flexion extension of the right knee.  She is also noticed increased instability and has noticed a buckling sensation at times.  She feels as though she has been a fall and has been using a golf club when walking long distances. We discussed the use of a cane or a walker to assist with ambulation.  She has tried taking Tylenol arthritis without much relief.  She is also tried performing knee joint exercises and increasing her walking regimen but has had increased difficulty.  Right knee joint was injected with cortisone today after informed consent.  She tolerated procedure well.  The procedure note was completed above.  Aftercare was discussed.  We will proceed with scheduling an MRI of the right knee to further assess.  Pain in left ankle and joints of left foot: She injured her left ankle during the fall on 07/09/2020 she has some residual tenderness on the lateral aspect but no warmth or swelling was noted.  She has good range of motion of the left ankle joint.  Her discomfort has progressively been improving.  DDD (degenerative disc disease), lumbar: She is not experiencing any discomfort in her lower back at this time.  She has no symptoms of radiculopathy.  Chronic right SI joint pain: She has no SI joint tenderness at this time.  Age-related osteoporosis without current pathological fracture - DEXA ordered by Dr. Criss Rosales on 06/06/2020 AP spine BMD 0.871 with T score -2.5.  She receives Prolia 60 mg subcutaneous  injections every 6 months.  Other medical conditions are listed as follows  Morton's neuroma, unspecified laterality  Positive PPD, treated  History of malignant neoplasm of breast  History of pityriasis rosea  History of hypertension  Orders: Orders Placed This Encounter  Procedures  . Large Joint Inj   No orders of the defined types were placed in this encounter.    Follow-Up Instructions: Return in about 4 weeks (around 09/14/2020) for Rheumatoid arthritis, Osteoarthritis.   Ofilia Neas, PA-C  Note - This record has been created using Dragon software.  Chart creation errors have been sought, but may not always  have been located. Such creation errors do not reflect on  the standard of medical care.

## 2020-08-17 ENCOUNTER — Other Ambulatory Visit: Payer: Self-pay

## 2020-08-17 ENCOUNTER — Ambulatory Visit: Payer: Medicare PPO | Admitting: Physician Assistant

## 2020-08-17 ENCOUNTER — Encounter: Payer: Self-pay | Admitting: Physician Assistant

## 2020-08-17 VITALS — BP 123/65 | HR 101 | Resp 16 | Ht 61.75 in | Wt 143.0 lb

## 2020-08-17 DIAGNOSIS — M533 Sacrococcygeal disorders, not elsewhere classified: Secondary | ICD-10-CM

## 2020-08-17 DIAGNOSIS — R7611 Nonspecific reaction to tuberculin skin test without active tuberculosis: Secondary | ICD-10-CM

## 2020-08-17 DIAGNOSIS — M810 Age-related osteoporosis without current pathological fracture: Secondary | ICD-10-CM

## 2020-08-17 DIAGNOSIS — M51369 Other intervertebral disc degeneration, lumbar region without mention of lumbar back pain or lower extremity pain: Secondary | ICD-10-CM

## 2020-08-17 DIAGNOSIS — M5136 Other intervertebral disc degeneration, lumbar region: Secondary | ICD-10-CM | POA: Diagnosis not present

## 2020-08-17 DIAGNOSIS — G5760 Lesion of plantar nerve, unspecified lower limb: Secondary | ICD-10-CM | POA: Diagnosis not present

## 2020-08-17 DIAGNOSIS — Z872 Personal history of diseases of the skin and subcutaneous tissue: Secondary | ICD-10-CM

## 2020-08-17 DIAGNOSIS — Z8679 Personal history of other diseases of the circulatory system: Secondary | ICD-10-CM

## 2020-08-17 DIAGNOSIS — M0579 Rheumatoid arthritis with rheumatoid factor of multiple sites without organ or systems involvement: Secondary | ICD-10-CM | POA: Diagnosis not present

## 2020-08-17 DIAGNOSIS — Z853 Personal history of malignant neoplasm of breast: Secondary | ICD-10-CM

## 2020-08-17 DIAGNOSIS — G8929 Other chronic pain: Secondary | ICD-10-CM

## 2020-08-17 DIAGNOSIS — M25572 Pain in left ankle and joints of left foot: Secondary | ICD-10-CM | POA: Diagnosis not present

## 2020-08-17 DIAGNOSIS — M1711 Unilateral primary osteoarthritis, right knee: Secondary | ICD-10-CM | POA: Diagnosis not present

## 2020-08-17 DIAGNOSIS — Z79899 Other long term (current) drug therapy: Secondary | ICD-10-CM

## 2020-08-17 MED ORDER — LIDOCAINE HCL 1 % IJ SOLN
1.5000 mL | INTRAMUSCULAR | Status: AC | PRN
  Administered 2020-08-17: 1.5 mL

## 2020-08-17 MED ORDER — TRIAMCINOLONE ACETONIDE 40 MG/ML IJ SUSP
40.0000 mg | INTRAMUSCULAR | Status: AC | PRN
  Administered 2020-08-17: 40 mg via INTRA_ARTICULAR

## 2020-08-17 NOTE — Patient Instructions (Signed)
Standing Labs We placed an order today for your standing lab work.   Please have your standing labs drawn in October and every 3 months   If possible, please have your labs drawn 2 weeks prior to your appointment so that the provider can discuss your results at your appointment.  We have open lab daily Monday through Thursday from 8:30-12:30 PM and 1:30-4:30 PM and Friday from 8:30-12:30 PM and 1:30-4:00 PM at the office of Dr. Shaili Deveshwar, [facility].   Please be advised, patients with office appointments requiring lab work will take precedents over walk-in lab work.  If possible, please come for your lab work on Monday and Friday afternoons, as you may experience shorter wait times. The office is located at [address], [address], [place], [place] [address] No appointment is necessary.   Labs are drawn by [facility]. Please bring your co-pay at the time of your lab draw.  You may receive a bill from [facility] for your lab work.  If you wish to have your labs drawn at another location, please call the office 24 hours in advance to send orders.  If you have any questions regarding directions or hours of operation,  please call [phone].   As a reminder, please drink plenty of water prior to coming for your lab work. Thanks!  COVID-19 vaccine recommendations:   COVID-19 vaccine is recommended for everyone (unless you are allergic to a vaccine component), even if you are on a medication that suppresses your immune system.   If you are on Methotrexate, Cellcept (mycophenolate), Rinvoq, Xeljanz, and Olumiant- hold the medication for 1 week after each vaccine. Hold Methotrexate for 2 weeks after the single dose COVID-19 vaccine.   If you are on Orencia subcutaneous injection - hold medication one week prior to and one week after the first COVID-19 vaccine dose (only).   If you are on Orencia IV infusions- time vaccination administration so that the first COVID-19 vaccination  will occur four weeks after the infusion and postpone the subsequent infusion by one week.   If you are on Cyclophosphamide or Rituxan infusions please contact your doctor prior to receiving the COVID-19 vaccine.   Do not take Tylenol or any anti-inflammatory medications (NSAIDs) 24 hours prior to the COVID-19 vaccination.   There is no direct evidence about the efficacy of the COVID-19 vaccine in individuals who are on medications that suppress the immune system.   Even if you are fully vaccinated, and you are on any medications that suppress your immune system, please continue to wear a mask, maintain at least six feet social distance and practice hand hygiene.   If you develop a COVID-19 infection, please contact your PCP or our office to determine if you need antibody infusion.  The booster vaccine is now available for immunocompromised patients. It is advised that if you had Pfizer vaccine you should get Pfizer booster.  If you had a Moderna vaccine then you should get a Moderna booster. Johnson and Johnson does not have a booster vaccine at this time.  Please see the following web sites for updated information.   [email or web address]-Patient-Resources.[email or web address]  [email or web address]   

## 2020-08-24 DIAGNOSIS — M25561 Pain in right knee: Secondary | ICD-10-CM | POA: Diagnosis not present

## 2020-08-31 ENCOUNTER — Telehealth: Payer: Self-pay | Admitting: *Deleted

## 2020-08-31 DIAGNOSIS — H40053 Ocular hypertension, bilateral: Secondary | ICD-10-CM | POA: Diagnosis not present

## 2020-08-31 DIAGNOSIS — Z79899 Other long term (current) drug therapy: Secondary | ICD-10-CM | POA: Diagnosis not present

## 2020-08-31 DIAGNOSIS — H2513 Age-related nuclear cataract, bilateral: Secondary | ICD-10-CM | POA: Diagnosis not present

## 2020-08-31 DIAGNOSIS — M0689 Other specified rheumatoid arthritis, multiple sites: Secondary | ICD-10-CM | POA: Diagnosis not present

## 2020-08-31 NOTE — Progress Notes (Signed)
Office Visit Note  Patient: Melissa Perez             Date of Birth: [birth year]           MRN: [id number]             PCP: Lucianne Lei, MD Referring: Lucianne Lei, MD Visit Date: 09/14/2020 Occupation: @GUAROCC @  Subjective:  Discuss MRI results   History of Present Illness: Melissa Perez is a 74 y.o. female with history of rheumatoid arthritis.  She is taking methotrexate 7 tablets by mouth once weekly and Plaquenil 200 mg 1 tablet by mouth daily.  She has not missed any doses of methotrexate or Plaquenil recently.  She presents today to discuss recent right knee joint MRI results.  She has noticed about 80 to 85% improvement in her right knee joint pain since having injection on 2020-08-17.  She denies any warmth or joint swelling at this time.  She states that her left ankle and foot pain has been improving as well.  She has been experiencing increased muscle cramps especially at night.  Activities of Daily Living:  Patient reports morning stiffness for less than 1  minute.   Patient Denies nocturnal pain.  Difficulty dressing/grooming: Denies Difficulty climbing stairs: Denies Difficulty getting out of chair: Denies Difficulty using hands for taps, buttons, cutlery, and/or writing: Denies  Review of Systems  Constitutional: Positive for fatigue.  HENT: Negative for mouth sores, mouth dryness and nose dryness.   Eyes: Negative for pain, itching and dryness.  Respiratory: Negative for shortness of breath and difficulty breathing.   Cardiovascular: Negative for chest pain and palpitations.  Gastrointestinal: Negative for blood in stool, constipation and diarrhea.  Endocrine: Negative for increased urination.  Genitourinary: Negative for difficulty urinating and painful urination.  Musculoskeletal: Positive for morning stiffness and muscle tenderness. Negative for arthralgias, joint pain, joint swelling, myalgias and myalgias.  Skin: Negative for color change, rash and redness.    Allergic/Immunologic: Negative for susceptible to infections.  Neurological: Negative for dizziness, numbness, headaches, memory loss and weakness.  Hematological: Positive for bruising/bleeding tendency.  Psychiatric/Behavioral: Negative for confusion.    PMFS History:  Patient Active Problem List   Diagnosis Date Noted  . Pruritus of scalp 06/20/2018  . DDD (degenerative disc disease), lumbar 05/27/2018  . Essential hypertension 03/19/2017  . History of pityriasis rosea 03/19/2017  . 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 History of malignant neoplasm of breast 03/19/2017  . Eczema 03/05/2017  . Vulvar dermatitis 03/05/2017  . Rheumatoid arthritis  10/15/2016  . High risk medication use 10/15/2016  . 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 S/P lumpectomy of breast 12/03/2011  . 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[place]) 11/01/2011    Past Medical History:  Diagnosis Date  . Arthritis    rheumatoid, take Humira  . Arthritis    rheumatoid  . Arthritis pain   . Back pain   . Breast cancer ([place])   . Cancer ([place])   . History of dental surgery   . Hyperlipidemia   . Hypertension   . Personal history of radiation therapy   . Rash   . Rheumatoid arthritis ([place])   . S/P lumpectomy of breast 12/03/2011    Family History  Problem Relation Age of Onset  . Breast cancer Mother   . Cancer Mother   . Kidney cancer Father   . Cancer Father   . Breast  cancer Sister   . Cancer Sister   . Lung cancer Sister   . Colon cancer Maternal Uncle   . Liver cancer Paternal Aunt    Past Surgical History:  Procedure Laterality Date  . ABDOMINAL HYSTERECTOMY    . APPENDECTOMY    . BREAST EXCISIONAL BIOPSY Left 1991   benign  . BREAST LUMPECTOMY Right 11/2011  . BREAST SURGERY     2 left breast biopsies-benign  . left breast biopsies  [place]   Social History   Social History Narrative   Adult Son in the [employer] History  Administered Date(s) Administered  . Influenza, High Dose Seasonal PF 09/10/2017  . Influenza-Unspecified 07/03/2018  . PFIZER SARS-COV-2 Vaccination 01/14/2020, 02/06/2020, 08/05/2020  . Zoster 11/24/2012     Objective: Vital Signs: BP 126/74 (BP Location: Left Arm, Patient Position: Sitting, Cuff Size: Normal)   Pulse 77   Resp 14   Ht 5' 1.75" (1.568 m)   Wt 141 lb 12.8 oz (64.3 kg)   BMI 26.15 kg/m    Physical Exam Vitals and nursing note reviewed.  Constitutional:      Appearance: She is well-developed.  HENT:     Head: Normocephalic and atraumatic.  Eyes:     Conjunctiva/sclera: Conjunctivae normal.  Pulmonary:     Effort: Pulmonary effort is normal.  Abdominal:     Palpations: Abdomen is soft.  Musculoskeletal:     Cervical back: Normal range of motion.  Skin:    General: Skin is warm and dry.     Capillary Refill: Capillary refill takes less than 2 seconds.  Neurological:     Mental Status: She is alert and oriented to person, place, and time.  Psychiatric:        Behavior: Behavior normal.      Musculoskeletal Exam: C-spine, thoracic spine, and lumbar spine good ROM.  Shoulder joints, elbow joints, wrist joints, MCPs, PIPs, and DIPs good ROM with no synovitis.  Complete fist formation bilaterally.  Hip joints, knee joints, and ankle joints good ROM with no discomfort.  No warmth or effusion of knee joints.  No warmth or swelling of ankle joints. No tenderness of MTPs.   CDAI Exam: CDAI Score: 0.4  Patient Global: 2 mm; Provider Global: 2 mm Swollen: 0 ; Tender: 0  Joint Exam 09/14/2020   No joint exam has been documented for this visit   There is currently no information documented on the homunculus. Go to the Rheumatology activity and complete the homunculus joint exam.  Investigation: No additional findings.  Imaging: No results found.  Recent Labs: Lab Results  Component Value Date   WBC 5.6 06/30/2020   HGB 11.8  06/30/2020   PLT 206 06/30/2020   NA 143 06/30/2020   K 4.0 06/30/2020   CL 105 06/30/2020   CO2 29 06/30/2020   GLUCOSE 91 06/30/2020   BUN 12 06/30/2020   CREATININE 0.88 06/30/2020   BILITOT 0.3 06/30/2020   ALKPHOS 53 06/20/2017   AST 28 06/30/2020   ALT 22 06/30/2020   PROT 6.1 06/30/2020   ALBUMIN 3.8 06/20/2017   CALCIUM 9.6 06/30/2020   GFRAA 76 06/30/2020    Speciality Comments: PLQ Eye Exam:02/11/2020 @ [facility] [place]. Follow up in 6 months.  Procedures:  No procedures performed Allergies: Dilaudid [hydromorphone hcl] and Hydrocodone   Assessment / Plan:     Visit Diagnoses: Rheumatoid arthritis: She has no synovitis or joint tenderness on exam today.  She is clinically doing well on Methotrexate 7 tablets by mouth once weekly, folic acid 2 mg po daily, and Plaquenil 200 mg 1 tablet by mouth daily.  She has not missed any doses of methotrexate or Plaquenil recently.  She had a right knee joint cortisone injection performed on 08/17/20 which improved her pain significantly.  She has good range of motion of the right knee joint on exam with no warmth or effusion.  MRI results of the right knee joint were discussed in detail with the patient. Synovitis and small effusion of the right knee was noted. Her right knee pain has improved by 80-85%. Her left ankle joint pain and inflammation have improved significantly.  She has good range of motion of the left ankle with no warmth or joint swelling.  She is not experiencing any other joint pain or inflammation at this time.  She will continue taking methotrexate 7 tablets by mouth once weekly, folic acid 2 mg by mouth daily, Plaquenil 200 mg 1 tablet by mouth daily.  She does not need any refills at this time.  She was advised to notify us if she develops increased joint pain or joint swelling.  She'll follow-up in the office in 3 months.  High risk medication use - Methotrexate 2.5 mg 7 tablets every 7 days, folic acid 1 mg  2 tablets daily, and Plaquenil 200 mg 1 tablet by mouth daily.  PLQ Eye Exam: 02/11/2020.  CBC and CMP were drawn on 06/30/20.  She is due to update lab work today.  Orders for CBC and CMP were released.  She'll be due to update lab work and January and every 3 months to monitor for drug toxicity.- Plan: CBC with Differential/Platelet, COMPLETE METABOLIC PANEL WITH GFR She has received all 3 covid-19 vaccinations. She has not had any recent infections.   Primary osteoarthritis of right knee - X-rays of the right knee revealed moderate osteoarthritis and moderate chondromalacia patella.  She had an MRI of the right knee performed on 08/24/20 which revealed a small joint effusion and positive synovitis.  She had a cortisone injection performed on 08/17/20 which provided significant relief.  She has good range of motion of the right knee joint with no warmth or effusion on examination today.  Her discomfort has resolved.  Pain in left ankle and joints of left foot: Her left foot pain and inflammation have improved significantly.  No warmth or joint swelling noted on exam.   DDD (degenerative disc disease), lumbar: She is not experiencing any discomfort in her lower back at this time.  No symptoms of radiculopathy.   Chronic right SI joint pain: She has no SI joint discomfort at this time.   Age-related osteoporosis without current pathological fracture - DEXA ordered by Dr. Criss Rosales on 06/06/2020 AP spine BMD 0.871 with T score -2.5.  She receives Prolia 60 mg subcutaneous injections every 6 months.  Her most recent Prolia injection was on 08/24/20.   Other medical conditions are listed as follows:   History of malignant neoplasm of breast  Morton's neuroma, unspecified laterality  Positive PPD, treated  History of hypertension  History of pityriasis rosea  Orders: Orders Placed This Encounter  Procedures  . CBC with Differential/Platelet  . COMPLETE METABOLIC PANEL WITH GFR   No orders of the  defined types were placed in this encounter.     Follow-Up Instructions: Return in about 3 months (around 12/15/2020) for Rheumatoid arthritis, Osteoarthritis.   Ofilia Neas, PA-C  Note - This record has been created using [employer].  Chart creation errors have been sought, but may not always  have been located. Such creation errors do not reflect on  the standard of medical care.

## 2020-08-31 NOTE — Telephone Encounter (Signed)
Right Knee MRI - completed on 9/22/202, reviewed by Hazel Sams, PA-C on 08/30/2020  Small joint effusion, positive synovitis  Negative for meniscal tear, ligament intact, tricompartmental degeneration.   Spoke with patient to review results, per patient cortisone injection on 08/17/2020 did help and offer much improvement, however she still notices some occasional discomfort. The knee is 80-85% better. With increased activity patient notices increased pain. Patient is currently taking PLQ and MTX. Advised patient if pain and swelling persists/increases to call the office.

## 2020-09-02 ENCOUNTER — Other Ambulatory Visit (HOSPITAL_COMMUNITY): Payer: Self-pay | Admitting: *Deleted

## 2020-09-05 ENCOUNTER — Other Ambulatory Visit: Payer: Self-pay

## 2020-09-05 ENCOUNTER — Ambulatory Visit (HOSPITAL_COMMUNITY)
Admission: RE | Admit: 2020-09-05 | Discharge: 2020-09-05 | Disposition: A | Discharge status: Home / Self Care | Payer: Medicare PPO | Source: Ambulatory Visit | Attending: Family Medicine | Admitting: Family Medicine

## 2020-09-05 DIAGNOSIS — M069 Rheumatoid arthritis, unspecified: Secondary | ICD-10-CM | POA: Insufficient documentation

## 2020-09-05 MED ORDER — DENOSUMAB 60 MG/ML ~~LOC~~ SOSY: 60.0000 mg | PREFILLED_SYRINGE | Freq: Once | SUBCUTANEOUS | Status: AC

## 2020-09-05 MED ORDER — DENOSUMAB 60 MG/ML ~~LOC~~ SOSY
PREFILLED_SYRINGE | SUBCUTANEOUS | Status: AC
  Administered 2020-09-05: 60 mg via SUBCUTANEOUS
  Filled 2020-09-05: qty 1

## 2020-09-14 ENCOUNTER — Ambulatory Visit: Payer: Medicare PPO | Admitting: Physician Assistant

## 2020-09-14 ENCOUNTER — Encounter: Payer: Self-pay | Admitting: Physician Assistant

## 2020-09-14 ENCOUNTER — Other Ambulatory Visit: Payer: Self-pay

## 2020-09-14 VITALS — BP 126/74 | HR 77 | Resp 14 | Ht 61.75 in | Wt 141.8 lb

## 2020-09-14 DIAGNOSIS — Z872 Personal history of diseases of the skin and subcutaneous tissue: Secondary | ICD-10-CM

## 2020-09-14 DIAGNOSIS — M810 Age-related osteoporosis without current pathological fracture: Secondary | ICD-10-CM

## 2020-09-14 DIAGNOSIS — Z79899 Other long term (current) drug therapy: Secondary | ICD-10-CM | POA: Diagnosis not present

## 2020-09-14 DIAGNOSIS — M0579 Rheumatoid arthritis with rheumatoid factor of multiple sites without organ or systems involvement: Secondary | ICD-10-CM

## 2020-09-14 DIAGNOSIS — Z853 Personal history of malignant neoplasm of breast: Secondary | ICD-10-CM | POA: Diagnosis not present

## 2020-09-14 DIAGNOSIS — G8929 Other chronic pain: Secondary | ICD-10-CM

## 2020-09-14 DIAGNOSIS — M1711 Unilateral primary osteoarthritis, right knee: Secondary | ICD-10-CM

## 2020-09-14 DIAGNOSIS — R7611 Nonspecific reaction to tuberculin skin test without active tuberculosis: Secondary | ICD-10-CM

## 2020-09-14 DIAGNOSIS — M533 Sacrococcygeal disorders, not elsewhere classified: Secondary | ICD-10-CM

## 2020-09-14 DIAGNOSIS — M5136 Other intervertebral disc degeneration, lumbar region: Secondary | ICD-10-CM | POA: Diagnosis not present

## 2020-09-14 DIAGNOSIS — Z8679 Personal history of other diseases of the circulatory system: Secondary | ICD-10-CM

## 2020-09-14 DIAGNOSIS — M25572 Pain in left ankle and joints of left foot: Secondary | ICD-10-CM | POA: Diagnosis not present

## 2020-09-14 DIAGNOSIS — G5760 Lesion of plantar nerve, unspecified lower limb: Secondary | ICD-10-CM | POA: Diagnosis not present

## 2020-09-14 NOTE — Patient Instructions (Signed)
Standing Labs We placed an order today for your standing lab work.   Please have your standing labs drawn in January and every 3 months   If possible, please have your labs drawn 2 weeks prior to your appointment so that the provider can discuss your results at your appointment.  We have open lab daily Monday through Thursday from 8:30-12:30 PM and 1:30-4:30 PM and Friday from 8:30-12:30 PM and 1:30-4:00 PM at the office of Dr. Shaili Deveshwar, [facility].   Please be advised, patients with office appointments requiring lab work will take precedents over walk-in lab work.  If possible, please come for your lab work on Monday and Friday afternoons, as you may experience shorter wait times. The office is located at [address], [address], [place], [place] [address] No appointment is necessary.   Labs are drawn by [facility]. Please bring your co-pay at the time of your lab draw.  You may receive a bill from [facility] for your lab work.  If you wish to have your labs drawn at another location, please call the office 24 hours in advance to send orders.  If you have any questions regarding directions or hours of operation,  please call [phone].   As a reminder, please drink plenty of water prior to coming for your lab work. Thanks!   

## 2020-09-15 LAB — COMPLETE METABOLIC PANEL WITH GFR
AG Ratio: 2 (calc) (ref 1.0–2.5)
ALT: 22 U/L (ref 6–29)
AST: 28 U/L (ref 10–35)
Albumin: 4.5 g/dL (ref 3.6–5.1)
Alkaline phosphatase (APISO): 69 U/L (ref 37–153)
BUN: 16 mg/dL (ref 7–25)
CO2: 28 mmol/L (ref 20–32)
Calcium: 9.5 mg/dL (ref 8.6–10.4)
Chloride: 103 mmol/L (ref 98–110)
Creat: 0.75 mg/dL (ref 0.60–0.93)
GFR, Est African American: 92 mL/min/{1.73_m2} (ref >=60)
GFR, Est Non African American: 80 mL/min/{1.73_m2} (ref >=60)
Globulin: 2.3 g/dL (calc) (ref 1.9–3.7)
Glucose, Bld: 88 mg/dL (ref 65–99)
Potassium: 4.7 mmol/L (ref 3.5–5.3)
Sodium: 140 mmol/L (ref 135–146)
Total Bilirubin: 0.4 mg/dL (ref 0.2–1.2)
Total Protein: 6.8 g/dL (ref 6.1–8.1)

## 2020-09-15 LAB — CBC WITH DIFFERENTIAL/PLATELET
Absolute Monocytes: 636 cells/uL (ref 200–950)
Basophils Absolute: 78 cells/uL (ref 0–200)
Basophils Relative: 1.3 %
Eosinophils Absolute: 120 cells/uL (ref 15–500)
Eosinophils Relative: 2 %
HCT: 39.1 % (ref 35.0–45.0)
Hemoglobin: 13.5 g/dL (ref 11.7–15.5)
Lymphs Abs: 1374 cells/uL (ref 850–3900)
MCH: 35.2 pg — ABNORMAL HIGH (ref 27.0–33.0)
MCHC: 34.5 g/dL (ref 32.0–36.0)
MCV: 101.8 fL — ABNORMAL HIGH (ref 80.0–100.0)
MPV: 10.6 fL (ref 7.5–12.5)
Monocytes Relative: 10.6 %
Neutro Abs: 3792 cells/uL (ref 1500–7800)
Neutrophils Relative %: 63.2 %
Platelets: 227 10*3/uL (ref 140–400)
RBC: 3.84 10*6/uL (ref 3.80–5.10)
RDW: 13.8 % (ref 11.0–15.0)
Total Lymphocyte: 22.9 %
WBC: 6 10*3/uL (ref 3.8–10.8)

## 2020-09-15 NOTE — Progress Notes (Signed)
MCV and MCH are mildly elevated but improving.  Rest of CBC WNL.  CMP WNL.

## 2020-09-16 ENCOUNTER — Telehealth: Payer: Self-pay

## 2020-09-16 DIAGNOSIS — M1990 Unspecified osteoarthritis, unspecified site: Secondary | ICD-10-CM | POA: Diagnosis not present

## 2020-09-16 NOTE — Telephone Encounter (Signed)
Jeani Hawking from Dr. Fransico Setters office is requesting patient's labwork results faxed to their office.  Fax [phone] Attn:  Medical Records

## 2020-09-16 NOTE — Telephone Encounter (Signed)
Labs faxed to Dr. Fransico Setters office.

## 2020-10-05 ENCOUNTER — Other Ambulatory Visit: Payer: Self-pay | Admitting: Rheumatology

## 2020-10-05 DIAGNOSIS — M0579 Rheumatoid arthritis with rheumatoid factor of multiple sites without organ or systems involvement: Secondary | ICD-10-CM

## 2020-10-05 NOTE — Telephone Encounter (Signed)
Last Visit: 09/14/2020 Next Visit: 12/15/2020 Labs: 09/14/2020 MCV and MCH are mildly elevated but improving. Rest of CBC WNL. CMP WNL.   Current Dose per office note 09/14/2020: Methotrexate 7 tablets by mouth once weekly DX: Rheumatoid arthritis  Okay to refill MTX?

## 2020-10-05 NOTE — Telephone Encounter (Signed)
Last Visit: 09/14/2020 Next Visit: 12/15/2020 Labs: 09/14/2020 MCV and MCH are mildly elevated but improving. Rest of CBC WNL. CMP WNL.  PLQ Eye Exam:02/11/2020   Current Dose per office note 03/70/9643: folic acid 1 mg 2 tablets daily, and Plaquenil 200 mg 1 tablet by mouth daily. DX: Rheumatoid arthritis  Okay to refill Folic Acid and PLQ?

## 2020-11-14 ENCOUNTER — Telehealth: Payer: Self-pay | Admitting: Rheumatology

## 2020-11-14 NOTE — Telephone Encounter (Signed)
Patient states she is having pain in right hand. Patient states she had and accident in September 23, 2020. Patient states she tripped over her vacuum. Patient states she had bruises on her thigh. Patient states she had swelling and pain as well as bruises. Patient states she is still having pain. Patient scheduled for an evaluation on 11/15/2020.

## 2020-11-14 NOTE — Telephone Encounter (Signed)
Patient calling in reference to right hand / wrist. Patient having pain, which is very uncomfortable. Patient states it is not getting any better since her fall. Please call to advise.

## 2020-11-14 NOTE — Progress Notes (Signed)
Office Visit Note  Patient: Melissa Perez             Date of Birth: [birth year]           MRN: [id number]             PCP: Lucianne Lei, MD Referring: Lucianne Lei, MD Visit Date: 11/15/2020 Occupation: @GUAROCC @  Subjective:  Right wrist pain  History of Present Illness: Melissa Perez is a 74 y.o. female with history of rheumatoid arthritis, osteoarthritis and DDD.  She is taking methotrexate 7 tablets by mouth once weekly, folic acid 2 mg po daily, and plaquenil 200 mg 1 tablet by mouth daily.  She reports at the end of October, she tripped over her vacuum and fell on her right wrist and knee joints.  She was not evaluated by a provider after the fall. She continues to experience pain in the right wrist and some radiating pain down her 5th digit. She initially had difficulty making a fist due to the severity of pain and swelling. She noticed significant bruising on the anterior aspect of her right forearm which has since resolved. She continues to have residual discomfort and difficulty lifting even light objects. She has tried to use a brace for support while typing and performing duties at home. She has not noticed much improvement applying voltaren gel topically.  She states she has some residual discomfort in both knee joints after the fall but denies any joint swelling. She had a right knee joint cortisone injection performed on 08/24/20, which alleviated most of her pain and inflammation prior to her second fall in October.  She denies any other joint pain or joint swelling at this time.   She denies any recent infections.   Activities of Daily Living:  Patient reports morning stiffness for 0 minutes.   Patient Reports nocturnal pain.  Difficulty dressing/grooming: Reports Difficulty climbing stairs: Denies Difficulty getting out of chair: Denies Difficulty using hands for taps, buttons, cutlery, and/or writing: Reports  Review of Systems  Constitutional: Positive for fatigue.  HENT:  Negative for mouth sores, mouth dryness and nose dryness.   Eyes: Negative for pain, itching and dryness.  Respiratory: Negative for shortness of breath and difficulty breathing.   Cardiovascular: Negative for chest pain and palpitations.  Gastrointestinal: Negative for blood in stool, constipation and diarrhea.  Endocrine: Negative for increased urination.  Genitourinary: Negative for difficulty urinating.  Musculoskeletal: Positive for arthralgias, joint pain, joint swelling, myalgias, muscle weakness, muscle tenderness and myalgias. Negative for morning stiffness.  Skin: Negative for color change, rash and redness.  Allergic/Immunologic: Negative for susceptible to infections.  Neurological: Positive for headaches and weakness. Negative for dizziness, numbness and memory loss.  Hematological: Positive for bruising/bleeding tendency.  Psychiatric/Behavioral: Positive for sleep disturbance. Negative for confusion.    PMFS History:  Patient Active Problem List   Diagnosis Date Noted  . Pruritus of scalp 06/20/2018  . DDD (degenerative disc disease), lumbar 05/27/2018  . Essential hypertension 03/19/2017  . History of pityriasis rosea 03/19/2017  . 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 History of malignant neoplasm of breast 03/19/2017  . Eczema 03/05/2017  . Vulvar dermatitis 03/05/2017  . Rheumatoid arthritis  10/15/2016  . High risk medication use 10/15/2016  . 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 S/P lumpectomy of breast 12/03/2011  . 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[place]) 11/01/2011    Past Medical History:  Diagnosis  Date  . Arthritis    rheumatoid, take Humira  . Arthritis    rheumatoid  . Arthritis pain   . Back pain   . Breast cancer ([place])   . Cancer ([place])   . History of dental surgery   . Hyperlipidemia   . Hypertension   . Personal history of radiation therapy   . Rash   . Rheumatoid  arthritis ([place])   . S/P lumpectomy of breast 12/03/2011    Family History  Problem Relation Age of Onset  . Breast cancer Mother   . Cancer Mother   . Kidney cancer Father   . Cancer Father   . Breast cancer Sister   . Cancer Sister   . Lung cancer Sister   . Colon cancer Maternal Uncle   . Liver cancer Paternal Aunt    Past Surgical History:  Procedure Laterality Date  . ABDOMINAL HYSTERECTOMY    . APPENDECTOMY    . BREAST EXCISIONAL BIOPSY Left 1991   benign  . BREAST LUMPECTOMY Right 11/2011  . BREAST SURGERY     2 left breast biopsies-benign  . left breast biopsies  [place]   Social History   Social History Narrative   Adult Son in the [employer] History  Administered Date(s) Administered  . Fluad Quad(high Dose 65+) 08/12/2019  . Influenza, High Dose Seasonal PF 09/10/2017, 09/14/2020  . Influenza-Unspecified 07/03/2018  . PFIZER SARS-COV-2 Vaccination 01/14/2020, 02/06/2020, 08/05/2020  . Zoster 11/24/2012     Objective: Vital Signs: BP (!) 165/81 (BP Location: Left Arm, Patient Position: Sitting, Cuff Size: Normal)   Pulse 96   Resp 14   Ht 5' 1.75" (1.568 m)   Wt 141 lb 12.8 oz (64.3 kg)   BMI 26.15 kg/m    Physical Exam Vitals and nursing note reviewed.  Constitutional:      Appearance: She is well-developed and well-nourished.  HENT:     Head: Normocephalic and atraumatic.  Eyes:     Extraocular Movements: EOM normal.     Conjunctiva/sclera: Conjunctivae normal.  Cardiovascular:     Pulses: Intact distal pulses.  Abdominal:     Palpations: Abdomen is soft.  Musculoskeletal:     Cervical back: Normal range of motion.  Skin:    General: Skin is warm and dry.     Capillary Refill: Capillary refill takes less than 2 seconds.  Neurological:     Mental Status: She is alert and oriented to person, place, and time.  Psychiatric:        Mood and Affect: Mood and affect normal.        Behavior: Behavior normal.       Musculoskeletal Exam: C-spine, thoracic spine, and lumbar spine good ROM.  Shoulder joints and elbow joints good ROM with no discomfort.  Tenderness and inflammation on the ulnar aspect of the right wrist joint.  Painful and limited ROM of the right wrist.  Left wrist joint has good ROM with no tenderness or swelling.  No tenderness or synovitis of MCP joints.  Complete fist formation bilaterally.  Hip joints good ROM with no discomfort.  Knee joints good ROM with no warmth or effusion.  Ankle joints good ROM with no tenderness or inflammation.  No tenderness of MTP joints.  CDAI Exam: CDAI Score: 2.4  Patient Global: 2 mm; Provider Global: 2 mm Swollen: 1 ; Tender: 1  Joint Exam 11/15/2020      Right  Left  Wrist  Swollen Tender  Investigation: No additional findings.  Imaging: No results found.  Recent Labs: Lab Results  Component Value Date   WBC 6.0 09/14/2020   HGB 13.5 09/14/2020   PLT 227 09/14/2020   NA 140 09/14/2020   K 4.7 09/14/2020   CL 103 09/14/2020   CO2 28 09/14/2020   GLUCOSE 88 09/14/2020   BUN 16 09/14/2020   CREATININE 0.75 09/14/2020   BILITOT 0.4 09/14/2020   ALKPHOS 53 06/20/2017   AST 28 09/14/2020   ALT 22 09/14/2020   PROT 6.8 09/14/2020   ALBUMIN 3.8 06/20/2017   CALCIUM 9.5 09/14/2020   GFRAA 92 09/14/2020    Speciality Comments: PLQ Eye Exam:02/11/2020 @ [facility] [place]. Follow up in 6 months.  Procedures:  No procedures performed Allergies: Dilaudid [hydromorphone hcl] and Hydrocodone   Assessment / Plan:     Visit Diagnoses: Rheumatoid arthritis: She has not had any recent rheumatoid arthritis flares.  She is clinically doing well on methotrexate 7 tablets by mouth once weekly, folic acid 2 mg by mouth daily, Plaquenil 200 mg 1 tablet by mouth daily.  She is tolerating these medications without any side effects and has not missed any doses recently.  She presents today with right wrist joint pain, limited range of  motion, and inflammation after a fall at the end of October.  X-rays today revealed a mildly displaced right radial styloid fracture and she was referred to Dr. Erlinda Hong for further evaluation and management.  She is not experiencing any other increased joint pain or inflammation at this time.  She will continue taking methotrexate and Plaquenil as prescribed.  She does not need any refills at this time.  She was advised to notify us if she develops signs or symptoms of a flare.  She will follow-up in the office in 1 month.   High risk medication use - Methotrexate 2.5 mg 7 tablets every 7 days, folic acid 1 mg 2 tablets daily, and Plaquenil 200 mg 1 tablet by mouth daily. PLQ Eye  Exam: 02/11/2020.  CBC and CMP were drawn on 09/14/2020.  She will be due to update lab work in January and every 3 months to monitor for drug toxicity.  Standing orders for CBC and CMP remain in place. She has not had any recent infections.  She is aware that she is to hold methotrexate if she develops signs or symptoms of an infection and to resume once the infection has completely cleared.  Pain in right wrist -She presents today with persistent pain in the right wrist joint. According to the patient at the end of October she fell after tripping over her vacuum and landed on her right wrist and both knees.  She developed significant bruising and was having difficulty making a complete fist with her right hand.  She continues to have residual discomfort and difficulty with ADLs at times.  She has limited range of motion of the right wrist joint with discomfort.  Tenderness and inflammation noted on the ulnar aspect of the right wrist noted. X-rays of the right wrist were obtained today which revealed a mildly displaced right radial styloid fracture.  She will be referred to Dr. Erlinda Hong for further evaluation and management.  In the meantime she was encouraged to wear her wrist brace on a daily basis.  We also discussed the importance of ice,  elevation, and applying Voltaren gel topically as needed for pain relief.  Plan: XR Wrist Complete Right  Primary osteoarthritis of right knee -  X-rays of the right knee revealed moderate osteoarthritis and moderate chondromalacia patella.  She has good range of motion of the right knee joint on examination today.  No warmth or effusion was noted.  She had a right knee joint cortisone injection performed on 08/17/2020 which provided significant pain relief.  She experienced some discomfort and bruising after a fall at the end of October but overall her discomfort has been improving.  Pain in left ankle and joints of left foot: Resolved.  She has good range of motion of the left ankle joint with no discomfort.  No tenderness or inflammation was noted.  DDD (degenerative disc disease), lumbar: She is not experiencing increased discomfort in her lower back at this time.  Chronic right SI joint pain: She is not experienced any SI joint discomfort at this time.  Age-related osteoporosis without current pathological fracture - DEXA ordered by Dr. Criss Rosales on 06/06/2020 AP spine BMD 0.871 with T score -2.5.  She receives Prolia 60 mg subcutaneous injections every 6 months.  Most recent Prolia injection was on 08/24/2020. She had a fall in August and October 2021.  She presents in the office today with persistent pain in the right wrist joint and was found to have a mildly displaced right radial styloid fracture.  We discussed the importance of lower extremity muscle strengthening and fall prevention.  She went to physical therapy for gait instability in the spring 2021 and no significant improvement.  We discussed the importance of performing home exercises.  Other medical conditions are listed as follows:   History of malignant neoplasm of breast  Positive PPD, treated  Morton's neuroma, unspecified laterality  History of hypertension  History of pityriasis rosea    Orders: Orders Placed This  Encounter  Procedures  . XR Wrist Complete Right   No orders of the defined types were placed in this encounter.    Follow-Up Instructions: Return in about 5 months (around 04/15/2021) for Rheumatoid arthritis, Osteoarthritis, DDD.   Ofilia Neas, PA-C  Note - This record has been created using Dragon software.  Chart creation errors have been sought, but may not always  have been located. Such creation errors do not reflect on  the standard of medical care.

## 2020-11-15 ENCOUNTER — Encounter: Payer: Self-pay | Admitting: Physician Assistant

## 2020-11-15 ENCOUNTER — Ambulatory Visit: Payer: Medicare PPO | Admitting: Physician Assistant

## 2020-11-15 ENCOUNTER — Ambulatory Visit: Payer: Self-pay

## 2020-11-15 ENCOUNTER — Other Ambulatory Visit: Payer: Self-pay

## 2020-11-15 VITALS — BP 165/81 | HR 96 | Resp 14 | Ht 61.75 in | Wt 141.8 lb

## 2020-11-15 DIAGNOSIS — R7611 Nonspecific reaction to tuberculin skin test without active tuberculosis: Secondary | ICD-10-CM | POA: Diagnosis not present

## 2020-11-15 DIAGNOSIS — M1711 Unilateral primary osteoarthritis, right knee: Secondary | ICD-10-CM

## 2020-11-15 DIAGNOSIS — M533 Sacrococcygeal disorders, not elsewhere classified: Secondary | ICD-10-CM

## 2020-11-15 DIAGNOSIS — Z853 Personal history of malignant neoplasm of breast: Secondary | ICD-10-CM | POA: Diagnosis not present

## 2020-11-15 DIAGNOSIS — M25572 Pain in left ankle and joints of left foot: Secondary | ICD-10-CM

## 2020-11-15 DIAGNOSIS — M25531 Pain in right wrist: Secondary | ICD-10-CM | POA: Diagnosis not present

## 2020-11-15 DIAGNOSIS — M51369 Other intervertebral disc degeneration, lumbar region without mention of lumbar back pain or lower extremity pain: Secondary | ICD-10-CM

## 2020-11-15 DIAGNOSIS — Z872 Personal history of diseases of the skin and subcutaneous tissue: Secondary | ICD-10-CM

## 2020-11-15 DIAGNOSIS — M5136 Other intervertebral disc degeneration, lumbar region: Secondary | ICD-10-CM

## 2020-11-15 DIAGNOSIS — Z79899 Other long term (current) drug therapy: Secondary | ICD-10-CM | POA: Diagnosis not present

## 2020-11-15 DIAGNOSIS — G8929 Other chronic pain: Secondary | ICD-10-CM

## 2020-11-15 DIAGNOSIS — M810 Age-related osteoporosis without current pathological fracture: Secondary | ICD-10-CM

## 2020-11-15 DIAGNOSIS — M0579 Rheumatoid arthritis with rheumatoid factor of multiple sites without organ or systems involvement: Secondary | ICD-10-CM | POA: Diagnosis not present

## 2020-11-15 DIAGNOSIS — Z8679 Personal history of other diseases of the circulatory system: Secondary | ICD-10-CM

## 2020-11-15 DIAGNOSIS — G5760 Lesion of plantar nerve, unspecified lower limb: Secondary | ICD-10-CM

## 2020-11-15 NOTE — Patient Instructions (Signed)
Standing Labs We placed an order today for your standing lab work.   Please have your standing labs drawn in January and every 3 months   If possible, please have your labs drawn 2 weeks prior to your appointment so that the provider can discuss your results at your appointment.  We have open lab daily Monday through Thursday from 8:30-12:30 PM and 1:30-4:30 PM and Friday from 8:30-12:30 PM and 1:30-4:00 PM at the office of Dr. Shaili Deveshwar, [facility].   Please be advised, patients with office appointments requiring lab work will take precedents over walk-in lab work.  If possible, please come for your lab work on Monday and Friday afternoons, as you may experience shorter wait times. The office is located at [address], [address], [place], [place] [address] No appointment is necessary.   Labs are drawn by [facility]. Please bring your co-pay at the time of your lab draw.  You may receive a bill from [facility] for your lab work.  If you wish to have your labs drawn at another location, please call the office 24 hours in advance to send orders.  If you have any questions regarding directions or hours of operation,  please call [phone].   As a reminder, please drink plenty of water prior to coming for your lab work. Thanks!   

## 2020-11-16 ENCOUNTER — Other Ambulatory Visit: Payer: Self-pay

## 2020-11-16 ENCOUNTER — Ambulatory Visit (INDEPENDENT_AMBULATORY_CARE_PROVIDER_SITE_OTHER): Payer: Medicare PPO | Admitting: Orthopaedic Surgery

## 2020-11-16 ENCOUNTER — Encounter: Payer: Self-pay | Admitting: Orthopaedic Surgery

## 2020-11-16 DIAGNOSIS — S52531A Colles' fracture of right radius, initial encounter for closed fracture: Secondary | ICD-10-CM

## 2020-11-16 NOTE — Progress Notes (Signed)
Office Visit Note   Patient: Melissa Perez           Date of Birth: Jan 19, 1946           MRN: [id number] Visit Date: 11/16/2020              Requested by: Lucianne Lei, MD [address] [place],  [place] [address] PCP: Lucianne Lei, MD   Assessment & Plan: Visit Diagnoses:  1. Closed Colles' fracture of right radius, initial encounter     Plan: Overall the alignment is well within acceptable limits for closed treatment.  She is had some increased volar angulation but this is mild and I think functionally speaking she will do just fine.  I think that the main issue is that she just needs hand and wrist rehab.  I have made a referral.  We will see her back in 6 weeks with two-view x-rays of the right wrist.  She may wean the brace as tolerated.  Follow-Up Instructions: Return in about 6 weeks (around 12/28/2020).   Orders:  Orders Placed This Encounter  Procedures  . Ambulatory referral to Occupational Therapy   No orders of the defined types were placed in this encounter.     Procedures: No procedures performed   Clinical Data: No additional findings.   Subjective: Chief Complaint  Patient presents with  . Right Wrist - Pain    Melissa Perez is a very pleasant 74 year old right-hand-dominant female referral from Dr. Estanislado Pandy office for subacute right distal radius fracture.  She had a fall about 6 weeks ago when she tripped over a vacuum cleaner.  She has been treated nonoperatively overall she is getting better but she still notices persistent weakness and discomfort when she is twisting her wrist when she is doing her hair.  She has been hesitant to drive due to fear of hurting the wrist.   Review of Systems  Constitutional: Negative.   HENT: Negative.   Eyes: Negative.   Respiratory: Negative.   Cardiovascular: Negative.   Endocrine: Negative.   Musculoskeletal: Negative.   Neurological: Negative.   Hematological: Negative.   Psychiatric/Behavioral: Negative.    All other systems reviewed and are negative.    Objective: Vital Signs: There were no vitals taken for this visit.  Physical Exam Vitals and nursing note reviewed.  Constitutional:      Appearance: She is well-developed and well-nourished.  HENT:     Head: Normocephalic and atraumatic.  Eyes:     Extraocular Movements: EOM normal.  Pulmonary:     Effort: Pulmonary effort is normal.  Abdominal:     Palpations: Abdomen is soft.  Musculoskeletal:     Cervical back: Neck supple.  Skin:    General: Skin is warm.     Capillary Refill: Capillary refill takes less than 2 seconds.  Neurological:     Mental Status: She is alert and oriented to person, place, and time.  Psychiatric:        Mood and Affect: Mood and affect normal.        Behavior: Behavior normal.        Thought Content: Thought content normal.        Judgment: Judgment normal.     Ortho Exam Right wrist shows decent range of motion with some mild discomfort and guarding.  No real tenderness to palpation.  She has no obvious deformities.  The ulnar head is slightly more prominent due to the deformity of the fracture. Specialty  Comments:  No specialty comments available.  Imaging: XR Wrist Complete Right  Result Date: 11/15/2020 Mildly displaced chronic radial styloid fracture was noted.    PMFS History: Patient Active Problem List   Diagnosis Date Noted  . Fracture, Colles, right, closed 11/16/2020  . Pruritus of scalp 06/20/2018  . DDD (degenerative disc disease), lumbar 05/27/2018  . Essential hypertension 03/19/2017  . History of pityriasis rosea 03/19/2017  . 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 History of malignant neoplasm of breast 03/19/2017  . Eczema 03/05/2017  . Vulvar dermatitis 03/05/2017  . Rheumatoid arthritis  10/15/2016  . High risk medication use 10/15/2016  . 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 S/P lumpectomy of breast 12/03/2011  .  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[place]) 11/01/2011   Past Medical History:  Diagnosis Date  . Arthritis    rheumatoid, take Humira  . Arthritis    rheumatoid  . Arthritis pain   . Back pain   . Breast cancer ([place])   . Cancer ([place])   . History of dental surgery   . Hyperlipidemia   . Hypertension   . Personal history of radiation therapy   . Rash   . Rheumatoid arthritis ([place])   . S/P lumpectomy of breast 12/03/2011    Family History  Problem Relation Age of Onset  . Breast cancer Mother   . Cancer Mother   . Kidney cancer Father   . Cancer Father   . Breast cancer Sister   . Cancer Sister   . Lung cancer Sister   . Colon cancer Maternal Uncle   . Liver cancer Paternal Aunt     Past Surgical History:  Procedure Laterality Date  . ABDOMINAL HYSTERECTOMY    . APPENDECTOMY    . BREAST EXCISIONAL BIOPSY Left 1991   benign  . BREAST LUMPECTOMY Right 11/2011  . BREAST SURGERY     2 left breast biopsies-benign  . left breast biopsies  [place]   Social History   Occupational History  . Occupation: Technical brewer: [employer]  Tobacco Use  . Smoking status: Former Smoker    Packs/day: 0.10    Types: Cigarettes    Quit date: 11/21/2003    Years since quitting: 17.0  . Smokeless tobacco: Never Used  Vaping Use  . Vaping Use: Never used  Substance and Sexual Activity  . Alcohol use: Yes    Alcohol/week: 3.0 standard drinks    Types: 3 Glasses of wine per week    Comment: occ  . Drug use: No  . Sexual activity: Not Currently

## 2020-11-17 ENCOUNTER — Encounter: Payer: Self-pay | Admitting: Occupational Therapy

## 2020-11-17 ENCOUNTER — Ambulatory Visit: Payer: Medicare PPO | Attending: Orthopaedic Surgery | Admitting: Occupational Therapy

## 2020-11-17 ENCOUNTER — Other Ambulatory Visit: Payer: Self-pay

## 2020-11-17 DIAGNOSIS — M6281 Muscle weakness (generalized): Secondary | ICD-10-CM | POA: Diagnosis not present

## 2020-11-17 DIAGNOSIS — M25531 Pain in right wrist: Secondary | ICD-10-CM | POA: Insufficient documentation

## 2020-11-17 DIAGNOSIS — R29898 Other symptoms and signs involving the musculoskeletal system: Secondary | ICD-10-CM | POA: Diagnosis not present

## 2020-11-17 DIAGNOSIS — R600 Localized edema: Secondary | ICD-10-CM | POA: Insufficient documentation

## 2020-11-17 DIAGNOSIS — Z9181 History of falling: Secondary | ICD-10-CM | POA: Diagnosis not present

## 2020-11-18 NOTE — Therapy (Signed)
[place] [place], [place], [address] Phone: [phone]   Fax:  [phone]  Occupational Therapy Evaluation  Patient Details  Name: Melissa Perez MRN: [id number] Date of Birth: [birth year] Referring Provider (OT): Leandrew Koyanagi, MD   Encounter Date: 11/17/2020   OT End of Session - 11/17/20 1430    Visit Number 1    Number of Visits 17    Date for OT Re-Evaluation 01/19/21    Authorization Type [employer]    Progress Note Due on Visit 10    OT Start Time 1314    OT Stop Time 1356    OT Time Calculation (min) 42 min    Activity Tolerance Patient limited by pain;Patient tolerated treatment well    Behavior During Therapy [facility] for tasks assessed/performed           Past Medical History:  Diagnosis Date  . Arthritis    rheumatoid, take Humira  . Arthritis    rheumatoid  . Arthritis pain   . Back pain   . Breast cancer ([place])   . Cancer ([place])   . History of dental surgery   . Hyperlipidemia   . Hypertension   . Personal history of radiation therapy   . Rash   . Rheumatoid arthritis ([place])   . S/P lumpectomy of breast 12/03/2011    Past Surgical History:  Procedure Laterality Date  . ABDOMINAL HYSTERECTOMY    . APPENDECTOMY    . BREAST EXCISIONAL BIOPSY Left 1991   benign  . BREAST LUMPECTOMY Right 11/2011  . BREAST SURGERY     2 left breast biopsies-benign  . left breast biopsies  1991 & 1990    There were no vitals filed for this visit.   Subjective Assessment - 11/17/20 1320    Subjective  Pt reports soreness and discomfort in the R wrist that increases with movement.    Pertinent History rheumatoid arthritis    Limitations --    Special Tests AROM    Patient Stated Goals "Minimize damage to soft tissue," improve grasp strength, and manage pain    Currently in Pain? Yes    Pain Score 6     Pain Location Wrist    Pain Orientation Right    Pain Descriptors / Indicators Sore    Pain  Type Acute pain    Pain Radiating Towards Radiates from wrist to elbow; also reports circumferential wrist pain    Pain Onset More than a month ago    Pain Frequency Intermittent    Aggravating Factors  Twisting/rotating, lifting, gripping    Pain Relieving Factors Cold, rest             [facility] OT Assessment - 11/17/20 1327      Assessment   Medical Diagnosis R distal radius fx (Colles' fx)    Referring Provider (OT) Leandrew Koyanagi, MD    Onset Date/Surgical Date --   late October   Hand Dominance Right    Next MD Visit 12/29/19    Prior Therapy OPPT for general unsteadiness on feet      Precautions   Precautions Fall    Required Braces or Orthoses --   pt reports wearing R wrist brace during activities for support     Balance Screen   Has the patient fallen in the past 6 months Yes    How many times? 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[employer], watching TV, reading      ADL   Grooming Modified independent   Requires extra time; compensates for weakness/discomfort in R dominant hand   Lower Body Dressing Modified independent;Increased time   Compensation for weakness in R dominant hand   Toileting - Clothing Manipulation Modified independent   see LB dressing   ADL comments Pt reports no difficulties with bathing or upper body dressing      IADL   Prior Level of Function Light Housekeeping Typically receives assistance from niece    Light Housekeeping Performs light daily tasks but cannot maintain acceptable level of cleanliness   Requires incr assistance since onset of injury   Prior Level of Function Meal Prep Ind; Reports not frequently cooking prior to  injury    Meal Prep Able to complete simple cold meal and snack prep;Able to complete simple warm meal prep   Mod I; reports compensating for weakness in R dominant hand   Prior Level of Function Scientist, research (physical sciences) Drives own vehicle   Pt reports feeling nervous about driving due to weakness/discomfort   Prior Level of Function Medication Managment Independent    Medication Management Is responsible for taking medication in correct dosages at correct time    Prior Level of Function Therapist, sports financial matters independently (budgets, writes checks, pays rent, bills goes to bank), collects and keeps track of income      Written Expression   Dominant Hand Right    Handwriting 90% legible    Written Experience Within Functional Limits      Sensation   Light Touch Appears Intact      Coordination   Gross Motor Movements are Fluid and Coordinated Yes    Fine Motor Movements are Fluid and Coordinated Yes    Coordination thumb-finger test WFL      Edema   Edema mild circumferential edema of R wrist      ROM / Strength   AROM / PROM / Strength AROM;Strength;PROM      Palpation   Palpation comment Slight "soreness" reported when palpating R wrist      AROM   Overall AROM  Deficits;Due to pain   wrist extension and radial deviation; forearm supination   Overall AROM Comments wrist flexion, wrist ulnar deviation, and forearm pronation WFL    AROM Assessment Site Wrist;Forearm    Right/Left Forearm Right    Right Forearm Pronation 85 Degrees    Right Forearm Supination 60 Degrees    Right/Left Wrist Right    Right Wrist Extension 55 Degrees    Right Wrist Flexion 78 Degrees    Right Wrist Radial Deviation 10 Degrees    Right Wrist Ulnar Deviation 32 Degrees      PROM   Overall PROM  Deficits;Due to pain    Overall PROM Comments Attempted conservative PROM of R wirst flex/ext; pt reported  limitations due to pain and further PROM assessment was discontinued      Strength   Overall Strength Comments Completed bilateral gross grasp screen and discontinued further strength testing due to  pt report of pain as well as limitations noted during AROM assessment      Hand Function   Right Hand Gross Grasp Impaired    Left Hand Gross Grasp Functional              OT Education - 11/17/20 1422    Education Details Education provided on role and purpose of OT. Also educated pt on weaning away from guarding R wrist and incorporating use of slight movements into daily activities more frequently and within limitations.    Person(s) Educated Patient    Methods Explanation;Demonstration            OT Short Term Goals - 11/17/20 1737      OT SHORT TERM GOAL #1   Title Pt will improve participation in grooming activities as evidenced by increasing AROM of wrist extension by at least 10 degrees.    Baseline R wrist extension at 55 degrees (~70 is [facility])    Time 4    Period Weeks    Status New    Target Date 12/22/20      OT SHORT TERM GOAL #2   Title Pt will identify and demonstrate understanding of at least 3 pain management strategies to utilize at home.    Baseline Does not currently have pain mgmt strategies.    Time 4    Period Weeks    Status New      OT SHORT TERM GOAL #3   Title Pt will report implementation of at least 2 safety strategies for improving balance and safety at home to aid with fall prevention.    Baseline Injury sustained due to a fall; pt reports multiple falls with the past 5 months.    Time 4    Period Weeks    Status New             OT Long Term Goals - 11/17/20 1740      OT LONG TERM GOAL #1   Title Pt will improve participation in BADLs as evidenced by increasing AROM of wrist extension to [facility] and forearm supination by at least 10 degrees with minimal pain.    Baseline Wrist extension at 55 degrees; Forearm sup at 60 (80-90 is WNL)     Time 8    Period Weeks    Status New    Target Date 01/19/21      OT LONG TERM GOAL #2   Title Pt will demonstrate independence with HEP and self-report compliance at least 75% of the time at home.    Baseline No HEP.    Time 8    Period Weeks    Status New      OT LONG TERM GOAL #3   Title Pt will demonstrate understanding of compensatory strategies, including use of assistive devices, 100% of the time to improve participation in light housekeeping and meal prep activities.    Baseline No compensatory strategies.    Time 8    Period Weeks    Status New      OT LONG TERM GOAL #4   Title Pt will open small-medium containers in 4/4 trials and report pain less than 5/10 to improve functional grip during IADLs    Baseline Pt reports difficulty opening containers 100% of the time    Time 8    Period Weeks    Status New              Plan - 11/17/20 1435  Clinical Impression Statement Pt is a 74 y.o. female presenting to OP OT with complaints of weakness and discomfort secondary to a closed Colles' fx of R distal radius reportedly sustained due to a fall around late October. Pt lives alone in a single-story home and PMH includes arthritis, rheumatoid arthritis, degenerative disc disease, and HTN. Pt will benefit from skilled occupational therapy services to address decreased strength and ROM, pain management, compensation/adaptation strategies, mild edema, fall prevention, and participation in ADL/IADLs.    OT Occupational Profile and History Problem Focused Assessment - Including review of records relating to presenting problem    Occupational performance deficits (Please refer to evaluation for details): ADL's;IADL's;Leisure;Other    Body Structure / Function / Physical Skills ADL;Decreased knowledge of use of DME;Strength;GMC;Pain;Edema;Body mechanics;UE functional use;IADL;ROM;Coordination;FMC;Balance    Psychosocial Skills Environmental  Adaptations    Rehab Potential Good     Clinical Decision Making Several treatment options, min-mod task modification necessary    Comorbidities Affecting Occupational Performance: May have comorbidities impacting occupational performance    Modification or Assistance to Complete Evaluation  Min-Moderate modification of tasks or assist with assess necessary to complete eval    OT Frequency 2x / week    OT Duration 8 weeks    OT Treatment/Interventions Self-care/ADL training;Moist Heat;Fluidtherapy;DME and/or AE instruction;Manual lymph drainage;Splinting;Contrast Bath;Compression bandaging;Therapeutic activities;Ultrasound;Therapeutic exercise;Cryotherapy;Iontophoresis;Passive range of motion;Electrical Stimulation;Energy conservation;Manual Therapy;Patient/family education;Functional Mobility Training    Plan Implement pain management strategies and initiate conservative exercises to improve ROM; balance screen due to mechanism of injury being a fall and pt report of multiple falls within past 6 months    Consulted and Agree with Plan of Care Patient           Patient will benefit from skilled therapeutic intervention in order to improve the following deficits and impairments:   Body Structure / Function / Physical Skills: ADL,Decreased knowledge of use of DME,Strength,GMC,Pain,Edema,Body mechanics,UE functional use,IADL,ROM,Coordination,FMC,Balance   Psychosocial Skills: Environmental  Adaptations   Visit Diagnosis: Other symptoms and signs involving the musculoskeletal system  Muscle weakness (generalized)  Pain in right wrist  Localized edema  History of falling    Problem List Patient Active Problem List   Diagnosis Date Noted  . Fracture, Colles, right, closed 11/16/2020  . Pruritus of scalp 06/20/2018  . DDD (degenerative disc disease), lumbar 05/27/2018  . Essential hypertension 03/19/2017  . History of pityriasis rosea 03/19/2017  . 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 History of malignant neoplasm of  breast 03/19/2017  . Eczema 03/05/2017  . Vulvar dermatitis 03/05/2017  . Rheumatoid arthritis  10/15/2016  . High risk medication use 10/15/2016  . 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 S/P lumpectomy of breast 12/03/2011  . 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[place]) 11/01/2011    Kathrine Cords, OTR/L, MSOT 11/18/2020, 9:05 AM  [place] [place], [place], [address] Phone: [phone]   Fax:  [phone]  Name: PRIYA MATSEN MRN: [id number] Date of Birth: [birth year]

## 2020-11-21 ENCOUNTER — Encounter: Payer: Self-pay | Admitting: Occupational Therapy

## 2020-11-21 ENCOUNTER — Other Ambulatory Visit: Payer: Self-pay

## 2020-11-21 ENCOUNTER — Ambulatory Visit: Payer: Medicare PPO | Admitting: Occupational Therapy

## 2020-11-21 DIAGNOSIS — M25531 Pain in right wrist: Secondary | ICD-10-CM

## 2020-11-21 DIAGNOSIS — M6281 Muscle weakness (generalized): Secondary | ICD-10-CM

## 2020-11-21 DIAGNOSIS — R29898 Other symptoms and signs involving the musculoskeletal system: Secondary | ICD-10-CM

## 2020-11-21 DIAGNOSIS — Z9181 History of falling: Secondary | ICD-10-CM

## 2020-11-21 DIAGNOSIS — R600 Localized edema: Secondary | ICD-10-CM | POA: Diagnosis not present

## 2020-11-21 NOTE — Therapy (Signed)
[address]. [place], [place], [address] Phone: [phone]   Fax:  [phone]  Occupational Therapy Treatment  Patient Details  Name: Melissa Perez MRN: [id number] Date of Birth: 03-15-1946 Referring Provider (OT): Leandrew Koyanagi, MD   Encounter Date: 11/21/2020   OT End of Session - 11/21/20 1605    Visit Number 2    Number of Visits 17    Date for OT Re-Evaluation 01/19/21    Authorization Type [employer]    Progress Note Due on Visit 10    OT Start Time 0932    OT Stop Time 1016    OT Time Calculation (min) 44 min    Activity Tolerance Patient limited by pain;Patient tolerated treatment well    Behavior During Therapy [facility] for tasks assessed/performed           Past Medical History:  Diagnosis Date   Arthritis    rheumatoid, take Humira   Arthritis    rheumatoid   Arthritis pain    Back pain    Breast cancer ([place])    Cancer ([place])    History of dental surgery    Hyperlipidemia    Hypertension    Personal history of radiation therapy    Rash    Rheumatoid arthritis ([place])    S/P lumpectomy of breast 12/03/2011    Past Surgical History:  Procedure Laterality Date   ABDOMINAL HYSTERECTOMY     APPENDECTOMY     BREAST EXCISIONAL BIOPSY Left 1991   benign   BREAST LUMPECTOMY Right 11/2011   BREAST SURGERY     2 left breast biopsies-benign   left breast biopsies  1991 & 1990    There were no vitals filed for this visit.   Subjective Assessment - 11/21/20 0936    Subjective  Pt reports some improvement with lifting light objects since last session.    Pertinent History rheumatoid arthritis    Special Tests AROM    Patient Stated Goals "Minimize damage to soft tissue," improve grasp strength, and manage pain    Currently in Pain? Other (Comment)   Pt reports no pain, but noted continued "soreness" rated 4.5/10   Pain Score --    Pain Descriptors / Indicators --    Pain  Onset --             Treatment:  Wrist AAROM flexion/extension (15x1) - rolling 8" ball forward and back allowing the wrist to flex/extend. Pt tolerated flexion well and reported some discomfort with extension.  Wrist AROM flexion/extension (25x1) - seated with wrist in neutral on tabletop. Pt reported mild discomfort with both flexion and extension; OT facilitated moving through a pain-free ROM.  Forearm supination/pronation (10x each side/20x total) - AROM supination/pronation attempted; pt reported pain, primarily with supination and activity was graded down to AAROM rolling a ball between both hands. Pt tolerated activity well with some discomfort during R supination.  Wrist ulnar/radial deviation (25x1) - seated with wrist on tabletop; included a towel underneath to facilitate ease of movement. Radial deviation significantly limited/ulnar deviation WFL.   - Verbal cues required during AAROM/AROM to move within pain-free ROM; pt educated to discontinue exercises with any sharp or strong pains      OT Education - 11/21/20 1604    Education Details Initiated discussion of fall prevention strategies at home; also reiterated weaning away from guarding R wrist and incorporating use of slight movements into daily activities more frequently and  within limitations. Initiated HEP and provided education on appropriate managing discomfort during exercises.    Person(s) Educated Patient    Methods Explanation    Comprehension Verbalized understanding            OT Short Term Goals - 11/21/20 1609      OT SHORT TERM GOAL #1   Title Pt will improve participation in grooming activities as evidenced by increasing AROM of wrist extension by at least 10 degrees.    Baseline R wrist extension at 55 degrees (~70 is [facility])    Time 4    Period Weeks    Status On-going    Target Date 12/22/20      OT SHORT TERM GOAL #2   Title Pt will identify and demonstrate understanding of at least 3 pain  management strategies to utilize at home.    Baseline Does not currently have pain mgmt strategies.    Time 4    Period Weeks    Status On-going      OT SHORT TERM GOAL #3   Title Pt will report implementation of at least 2 safety strategies for improving balance and safety at home to aid with fall prevention.   Introduced fall prevention strategies   Baseline Injury sustained due to a fall; pt reports multiple falls with the past 5 months.    Time 4    Period Weeks    Status On-going             OT Long Term Goals - 11/21/20 1611      OT LONG TERM GOAL #1   Title Pt will improve participation in BADLs as evidenced by increasing AROM of wrist extension to [facility] and forearm supination by at least 10 degrees with minimal pain.    Baseline Wrist extension at 55 degrees; Forearm sup at 60 (80-90 is WNL)    Time 8    Period Weeks    Status On-going      OT LONG TERM GOAL #2   Title Pt will demonstrate independence with HEP and self-report compliance at least 75% of the time at home.   Initiated HEP   Baseline No HEP.    Time 8    Period Weeks    Status On-going      OT LONG TERM GOAL #3   Title Pt will demonstrate understanding of compensatory strategies, including use of assistive devices, 100% of the time to improve participation in light housekeeping and meal prep activities.    Baseline No compensatory strategies.    Time 8    Period Weeks    Status On-going      OT LONG TERM GOAL #4   Title Pt will open small-medium containers in 4/4 trials and report pain less than 5/10 to improve functional grip during IADLs    Baseline Pt reports difficulty opening containers 100% of the time    Time 8    Period Weeks    Status On-going              Plan - 11/21/20 1606    Clinical Impression Statement Pt was receptive to incorporating fall prevention strategies at home and completed AAROM and AROM wrist exercises successfully within tolerable level of discomfort. Difficulties  and discomfort with forearm supination, wrist extension, and wrist radial deviation continue to be limiting. Pt guarding of R wrist and pain due to status of fx likely impacting discomfort w/ ROM.    OT Occupational Profile and History  Problem Focused Assessment - Including review of records relating to presenting problem    Occupational performance deficits (Please refer to evaluation for details): ADL's;IADL's;Leisure;Other    Body Structure / Function / Physical Skills ADL;Decreased knowledge of use of DME;Strength;GMC;Pain;Edema;Body mechanics;UE functional use;IADL;ROM;Coordination;FMC;Balance    Psychosocial Skills Environmental  Adaptations    Rehab Potential Good    Clinical Decision Making Several treatment options, min-mod task modification necessary    Comorbidities Affecting Occupational Performance: May have comorbidities impacting occupational performance    Modification or Assistance to Complete Evaluation  Min-Moderate modification of tasks or assist with assess necessary to complete eval    OT Frequency 2x / week    OT Duration 8 weeks    OT Treatment/Interventions Self-care/ADL training;Moist Heat;Fluidtherapy;DME and/or AE instruction;Manual lymph drainage;Splinting;Contrast Bath;Compression bandaging;Therapeutic activities;Ultrasound;Therapeutic exercise;Cryotherapy;Iontophoresis;Passive range of motion;Electrical Stimulation;Energy conservation;Manual Therapy;Patient/family education;Functional Mobility Training    Plan Assess progress of AAROM and AROM HEP; continue to address goals and fall prevention strategies    Consulted and Agree with Plan of Care Patient           Patient will benefit from skilled therapeutic intervention in order to improve the following deficits and impairments:   Body Structure / Function / Physical Skills: ADL,Decreased knowledge of use of DME,Strength,GMC,Pain,Edema,Body mechanics,UE functional use,IADL,ROM,Coordination,FMC,Balance    Psychosocial Skills: Environmental  Adaptations   Visit Diagnosis: Other symptoms and signs involving the musculoskeletal system  Muscle weakness (generalized)  Pain in right wrist  History of falling  Localized edema    Problem List Patient Active Problem List   Diagnosis Date Noted   Fracture, Colles, right, closed 11/16/2020   Pruritus of scalp 06/20/2018   DDD (degenerative disc disease), lumbar 05/27/2018   Essential hypertension 03/19/2017   History of pityriasis rosea 03/19/2017   Chronic right SI joint pain 03/19/2017   History of malignant neoplasm of breast 03/19/2017   Eczema 03/05/2017   Vulvar dermatitis 03/05/2017   Rheumatoid arthritis  10/15/2016   High risk medication use 10/15/2016   Morton's metatarsalgia 10/15/2016   Positive PPD, 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   S/P lumpectomy of breast 12/03/2011   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[place]) 11/01/2011    Kathrine Cords, OTR/L, MSOT 11/21/2020, 4:50 PM  [place] [place], [place], [address] Phone: [phone]   Fax:  [phone]  Name: Melissa Perez MRN: [id number] Date of Birth: 1946-07-14

## 2020-11-21 NOTE — Patient Instructions (Signed)
Access Code: [email or web address] URL: [email or web address] Date: 11/21/2020 Prepared by:  Exercises Wrist AROM Flexion Extension - 2-3 x daily - 7 x weekly - 1 sets - 25 reps Forearm AAROM Supination and Pronation with Ball - 2-3 x daily - 7 x weekly - 1 sets - 20 reps Wrist AROM Radial Ulnar Deviation - 2-3 x daily - 7 x weekly - 1 sets - 25 reps

## 2020-11-24 ENCOUNTER — Ambulatory Visit: Payer: Medicare PPO | Admitting: Occupational Therapy

## 2020-11-24 ENCOUNTER — Encounter: Payer: Self-pay | Admitting: Occupational Therapy

## 2020-11-24 ENCOUNTER — Other Ambulatory Visit: Payer: Self-pay

## 2020-11-24 DIAGNOSIS — Z9181 History of falling: Secondary | ICD-10-CM

## 2020-11-24 DIAGNOSIS — M6281 Muscle weakness (generalized): Secondary | ICD-10-CM | POA: Diagnosis not present

## 2020-11-24 DIAGNOSIS — R600 Localized edema: Secondary | ICD-10-CM

## 2020-11-24 DIAGNOSIS — M25531 Pain in right wrist: Secondary | ICD-10-CM

## 2020-11-24 DIAGNOSIS — R29898 Other symptoms and signs involving the musculoskeletal system: Secondary | ICD-10-CM | POA: Diagnosis not present

## 2020-11-24 NOTE — Therapy (Signed)
[address] [place], [place], [address] Phone: [phone]   Fax:  [phone]  Occupational Therapy Treatment  Patient Details  Name: Melissa Perez MRN: [id number] Date of Birth: 06-07-1946 Referring Provider (OT): Leandrew Koyanagi, MD   Encounter Date: 11/24/2020   OT End of Session - 11/24/20 1001    Visit Number 3    Number of Visits 17    Date for OT Re-Evaluation 01/19/21    Authorization Type [employer]    Progress Note Due on Visit 10    OT Start Time 0932    OT Stop Time 1015    OT Time Calculation (min) 43 min    Activity Tolerance Patient tolerated treatment well;Other (comment)   Pt limited by soreness/tenderness   Behavior During Therapy [facility] for tasks assessed/performed           Past Medical History:  Diagnosis Date  . Arthritis    rheumatoid, take Humira  . Arthritis    rheumatoid  . Arthritis pain   . Back pain   . Breast cancer ([place])   . Cancer ([place])   . History of dental surgery   . Hyperlipidemia   . Hypertension   . Personal history of radiation therapy   . Rash   . Rheumatoid arthritis ([place])   . S/P lumpectomy of breast 12/03/2011    Past Surgical History:  Procedure Laterality Date  . ABDOMINAL HYSTERECTOMY    . APPENDECTOMY    . BREAST EXCISIONAL BIOPSY Left 1991   benign  . BREAST LUMPECTOMY Right 11/2011  . BREAST SURGERY     2 left breast biopsies-benign  . left breast biopsies  1991 & 1990    There were no vitals filed for this visit.   Subjective Assessment - 11/24/20 1000    Subjective  Pt reports increased soreness that she believes is associated with completing AROM exercises for HEP    Pertinent History rheumatoid arthritis    Special Tests --    Patient Stated Goals "Minimize damage to soft tissue," improve grasp strength, and manage pain    Currently in Pain? No/denies    Pain Location Wrist    Pain Orientation Right    Pain Descriptors / Indicators  Sore    Pain Frequency --            OT Treatments/Exercises (OP) - 11/24/20 0941      Wrist Exercises   Wrist Flexion PROM;Right;5 reps   OT facilitated gentle, slow PROM within pain-free ROM for wrist flex/ext, radial/ulnar deviation, and forearm sup/pro. Pt reported mild (3/10) discomfort and dull stretching primarily with ext, radial deviation, and sup   Wrist Extension PROM;Right    Wrist Radial Deviation PROM;Right;5 reps    Wrist Ulnar Deviation PROM;Right    Other wrist exercises Forearm pronation/supingation PROM; Right; 5 reps    Other wrist exercises AAROM wrist flex/ext and forearm pro/sup(10x) - rolling 8" ball forward and back on tabletop; rotating ball between both hands. Pt tolerated activity well and reported some discomfort with wrist ext and forearm sup      Moist Heat Therapy   Number Minutes Moist Heat 8 Minutes   Pt responded well with no adverse reaction   Moist Heat Location Wrist      Manual Therapy   Edema Management Gentle massage completed in R hand and wrist   pt reported mild tenderness to palpation of ulnar side of palmar and dorsal wrist  and forearm           OT Education - 11/24/20 1001    Education Details Education provided on simple edema and pain management techniques to be implemented at home. Reviewed HEP.    Person(s) Educated Patient    Methods Explanation    Comprehension Verbalized understanding            OT Short Term Goals - 11/24/20 1101      OT SHORT TERM GOAL #1   Title Pt will improve participation in grooming activities as evidenced by increasing AROM of wrist extension by at least 10 degrees.    Baseline R wrist extension at 55 degrees (~70 is [facility])    Time 4    Period Weeks    Status On-going    Target Date 12/22/20      OT SHORT TERM GOAL #2   Title Pt will identify and demonstrate understanding of at least 3 pain management strategies to utilize at home.    Baseline Does not currently have pain mgmt strategies.     Time 4    Period Weeks    Status On-going      OT SHORT TERM GOAL #3   Title Pt will report implementation of at least 2 safety strategies for improving balance and safety at home to aid with fall prevention.   Introduced fall prevention strategies   Baseline Injury sustained due to a fall; pt reports multiple falls with the past 5 months.    Time 4    Period Weeks    Status On-going             OT Long Term Goals - 11/24/20 1101      OT LONG TERM GOAL #1   Title Pt will improve participation in BADLs as evidenced by increasing AROM of wrist extension to [facility] and forearm supination by at least 10 degrees with minimal pain.    Baseline Wrist extension at 55 degrees; Forearm sup at 60 (80-90 is WNL)    Time 8    Period Weeks    Status On-going      OT LONG TERM GOAL #2   Title Pt will demonstrate independence with HEP and self-report compliance at least 75% of the time at home.   Initiated HEP   Baseline No HEP.    Time 8    Period Weeks    Status On-going      OT LONG TERM GOAL #3   Title Pt will demonstrate understanding of compensatory strategies, including use of assistive devices, 100% of the time to improve participation in light housekeeping and meal prep activities.    Baseline No compensatory strategies.    Time 8    Period Weeks    Status On-going      OT LONG TERM GOAL #4   Title Pt will open small-medium containers in 4/4 trials and report pain less than 5/10 to improve functional grip during IADLs    Baseline Pt reports difficulty opening containers 100% of the time    Time 8    Period Weeks    Status On-going             Plan - 11/24/20 1005    Clinical Impression Statement Discomfort and tenderness with ROM continues to be limiting. Wrist PROM was completed during session to add to AROM exercises being completed at home. Pt reported discomfort with PROM so minimal repetitions and slow stretching were incorporated to facilitate successfully moving  within tolerable level of discomfort while still facilitating light stretching.    OT Occupational Profile and History Problem Focused Assessment - Including review of records relating to presenting problem    Occupational performance deficits (Please refer to evaluation for details): ADL's;IADL's;Leisure;Other    Body Structure / Function / Physical Skills ADL;Decreased knowledge of use of DME;Strength;GMC;Pain;Edema;Body mechanics;UE functional use;IADL;ROM;Coordination;FMC;Balance    Psychosocial Skills Environmental  Adaptations    Rehab Potential Good    Clinical Decision Making Several treatment options, min-mod task modification necessary    Comorbidities Affecting Occupational Performance: May have comorbidities impacting occupational performance    Modification or Assistance to Complete Evaluation  Min-Moderate modification of tasks or assist with assess necessary to complete eval    OT Frequency 2x / week    OT Duration 8 weeks    OT Treatment/Interventions Self-care/ADL training;Moist Heat;Fluidtherapy;DME and/or AE instruction;Manual lymph drainage;Splinting;Contrast Bath;Compression bandaging;Therapeutic activities;Ultrasound;Therapeutic exercise;Cryotherapy;Iontophoresis;Passive range of motion;Electrical Stimulation;Energy conservation;Manual Therapy;Patient/family education;Functional Mobility Training    Plan Continue to address goals, edema, pain management, and fall prevention strategies    Consulted and Agree with Plan of Care Patient           Patient will benefit from skilled therapeutic intervention in order to improve the following deficits and impairments:   Body Structure / Function / Physical Skills: ADL,Decreased knowledge of use of DME,Strength,GMC,Pain,Edema,Body mechanics,UE functional use,IADL,ROM,Coordination,FMC,Balance   Psychosocial Skills: Environmental  Adaptations   Visit Diagnosis: Other symptoms and signs involving the musculoskeletal  system  Muscle weakness (generalized)  Pain in right wrist  Localized edema  History of falling    Problem List Patient Active Problem List   Diagnosis Date Noted  . Fracture, Colles, right, closed 11/16/2020  . Pruritus of scalp 06/20/2018  . DDD (degenerative disc disease), lumbar 05/27/2018  . Essential hypertension 03/19/2017  . History of pityriasis rosea 03/19/2017  . 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 History of malignant neoplasm of breast 03/19/2017  . Eczema 03/05/2017  . Vulvar dermatitis 03/05/2017  . Rheumatoid arthritis  10/15/2016  . High risk medication use 10/15/2016  . 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 S/P lumpectomy of breast 12/03/2011  . 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[place]) 11/01/2011    Kathrine Cords, OTR/L, MSOT 11/24/2020, 11:22 AM  [address] [place], [place], [address] Phone: [phone]   Fax:  [phone]  Name: DANIAH GHORMLEY MRN: [id number] Date of Birth: Dec 27, 1945

## 2020-11-28 ENCOUNTER — Ambulatory Visit: Payer: Medicare PPO | Admitting: Occupational Therapy

## 2020-11-28 DIAGNOSIS — Z20822 Contact with and (suspected) exposure to covid-19: Secondary | ICD-10-CM | POA: Diagnosis not present

## 2020-12-01 NOTE — Progress Notes (Signed)
Office Visit Note  Patient: Melissa Perez             Date of Birth: 29-Nov-1946           MRN: [id number]             PCP: Lucianne Lei, MD Referring: Lucianne Lei, MD Visit Date: 12/15/2020 Occupation: @GUAROCC @  Subjective:  Right knee joint pain   History of Present Illness: Melissa Perez is a 74 y.o. female with history of seropositive rheumatoid arthritis, DDD, and osteoarthritis.  She is taking methotrexate 7 tablets by mouth once weekly, folic acid 2 mg by mouth daily, and Plaquenil 200 mg 1 tablet by mouth daily.  She is tolerating both medications and has not missed any doses recently.  She has ongoing pain in right knee joint after a fall in August 2021.  She states she experiences a buckling sensation intermittently.  She denies any warmth or swelling at this time.  She denies any catching or locking sensation.  She denies any discomfort in her left knee joint.  She has ongoing pain and limited range of motion in the right wrist joint.  She was evaluated by Dr. Erlinda Hong on 11/16/20 for management of colles' fracture.  She was referred for hand and wrist rehab which has started to improve her strength gradually. She denies any recent infections.    Activities of Daily Living:  Patient reports morning stiffness for   none.   Patient Denies nocturnal pain.  Difficulty dressing/grooming: Reports Difficulty climbing stairs: Denies Difficulty getting out of chair: Denies Difficulty using hands for taps, buttons, cutlery, and/or writing: Reports  Review of Systems  Constitutional: Positive for fatigue.  HENT: Negative for mouth sores, mouth dryness and nose dryness.   Eyes: Negative for pain, visual disturbance and dryness.  Respiratory: Negative for cough, hemoptysis, shortness of breath and difficulty breathing.   Cardiovascular: Negative for chest pain, palpitations, hypertension and swelling in legs/feet.  Gastrointestinal: Negative for blood in stool, constipation and diarrhea.   Endocrine: Negative for increased urination.  Genitourinary: Negative for painful urination.  Musculoskeletal: Positive for arthralgias, gait problem, joint pain, joint swelling and muscle weakness. Negative for myalgias, morning stiffness, muscle tenderness and myalgias.  Skin: Positive for rash. Negative for color change, pallor, hair loss, nodules/bumps, skin tightness, ulcers and sensitivity to sunlight.  Neurological: Negative for dizziness, numbness and headaches.  Hematological: Positive for bruising/bleeding tendency. Negative for swollen glands.  Psychiatric/Behavioral: Negative for depressed mood and sleep disturbance. The patient is not nervous/anxious.     PMFS History:  Patient Active Problem List   Diagnosis Date Noted  . Fracture, Colles, right, closed 11/16/2020  . Pruritus of scalp 06/20/2018  . DDD (degenerative disc disease), lumbar 05/27/2018  . Essential hypertension 03/19/2017  . History of pityriasis rosea 03/19/2017  . 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 History of malignant neoplasm of breast 03/19/2017  . Eczema 03/05/2017  . Vulvar dermatitis 03/05/2017  . Rheumatoid arthritis  10/15/2016  . High risk medication use 10/15/2016  . 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 S/P lumpectomy of breast 12/03/2011  . 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[place]) 11/01/2011    Past Medical History:  Diagnosis Date  . Arthritis    rheumatoid, take Humira  . Arthritis    rheumatoid  . Arthritis pain   . Back pain   . Breast cancer ([place])   . Cancer ([place])   .  History of dental surgery   . Hyperlipidemia   . Hypertension   . Personal history of radiation therapy   . Rash   . Rheumatoid arthritis ([place])   . S/P lumpectomy of breast 12/03/2011    Family History  Problem Relation Age of Onset  . Breast cancer Mother   . Cancer Mother   . Kidney cancer Father   . Cancer Father   . Breast cancer  Sister   . Cancer Sister   . Lung cancer Sister   . Colon cancer Maternal Uncle   . Liver cancer Paternal Aunt    Past Surgical History:  Procedure Laterality Date  . ABDOMINAL HYSTERECTOMY    . APPENDECTOMY    . BREAST EXCISIONAL BIOPSY Left 1991   benign  . BREAST LUMPECTOMY Right 11/2011  . BREAST SURGERY     2 left breast biopsies-benign  . left breast biopsies  [place]   Social History   Social History Narrative   Adult Son in the [employer] History  Administered Date(s) Administered  . Fluad Quad(high Dose 65+) 08/12/2019  . Influenza, High Dose Seasonal PF 09/10/2017, 09/14/2020  . Influenza-Unspecified 07/03/2018  . PFIZER SARS-COV-2 Vaccination 01/14/2020, 02/06/2020, 08/05/2020  . Zoster 11/24/2012     Objective: Vital Signs: BP 125/64 (BP Location: Left Arm, Patient Position: Sitting, Cuff Size: Normal)   Pulse 82   Resp 15   Ht 5' 1.75" (1.568 m)   Wt 141 lb (64 kg)   BMI 26.00 kg/m    Physical Exam Vitals and nursing note reviewed.  Constitutional:      Appearance: She is well-developed and well-nourished.  HENT:     Head: Normocephalic and atraumatic.  Eyes:     Extraocular Movements: EOM normal.     Conjunctiva/sclera: Conjunctivae normal.  Cardiovascular:     Pulses: Intact distal pulses.  Pulmonary:     Effort: Pulmonary effort is normal.  Abdominal:     Palpations: Abdomen is soft.  Musculoskeletal:     Cervical back: Normal range of motion.  Skin:    General: Skin is warm and dry.     Capillary Refill: Capillary refill takes less than 2 seconds.  Neurological:     Mental Status: She is alert and oriented to person, place, and time.  Psychiatric:        Mood and Affect: Mood and affect normal.        Behavior: Behavior normal.      Musculoskeletal Exam: C-spine, thoracic spine, lumbar spine good range of motion with no discomfort.  Shoulders, elbows, MCPs, PIPs, DIPs have good range of motion with no synovitis.   Limited ROM of the right wrist joint. She is able to make a complete fist bilaterally.  Hip joints have good range of motion with no discomfort.  Knee joints have good range of motion with no warmth or effusion.  Some tenderness along the medial joint line of the right knee.  Ankle joints have good range of motion without tenderness or inflammation.  No tenderness of MTP joints.  CDAI Exam: CDAI Score: 0.4  Patient Global: 2 mm; Provider Global: 2 mm Swollen: 0 ; Tender: 0  Joint Exam 12/15/2020   No joint exam has been documented for this visit   There is currently no information documented on the homunculus. Go to the Rheumatology activity and complete the homunculus joint exam.  Investigation: No additional findings.  Imaging: XR Wrist Complete Right  Result Date: 11/15/2020  Mildly displaced chronic radial styloid fracture was noted.   Recent Labs: Lab Results  Component Value Date   WBC 6.0 09/14/2020   HGB 13.5 09/14/2020   PLT 227 09/14/2020   NA 140 09/14/2020   K 4.7 09/14/2020   CL 103 09/14/2020   CO2 28 09/14/2020   GLUCOSE 88 09/14/2020   BUN 16 09/14/2020   CREATININE 0.75 09/14/2020   BILITOT 0.4 09/14/2020   ALKPHOS 53 06/20/2017   AST 28 09/14/2020   ALT 22 09/14/2020   PROT 6.8 09/14/2020   ALBUMIN 3.8 06/20/2017   CALCIUM 9.5 09/14/2020   GFRAA 92 09/14/2020    Speciality Comments: PLQ Eye Exam:02/11/2020 @ [facility] [place]. Follow up in 6 months.  Procedures:  No procedures performed Allergies: Dilaudid [hydromorphone], Dilaudid [hydromorphone hcl], and Hydrocodone   Assessment / Plan:     Visit Diagnoses: Rheumatoid arthritis: She has no joint tenderness or synovitis on exam.  She has not had any recent rheumatoid arthritis flares.  She is clinically doing well on Methotrexate 7 tablets by mouth once weekly, folic acid 2 mg po daily, and plaquenil 200 mg 1 tablet by mouth daily.  She is tolerating both medications without any side  effects and has not missed any doses recently.  She has been experiencing persistent discomfort and a buckling sensation in her right knee joint since a fall in August 2021.  The right knee joint was injected with cortisone on 08/17/2020 which provided significant relief.  She had a right knee MRI performed on 08/24/2020 which revealed a small joint effusion and synovitis.  No meniscal tear was noted at that time.  On examination today she has full range of motion of the right knee joint with no warmth or effusion.  She has some tenderness along the medial joint line.  Different treatment options were discussed.  We discussed the importance of lower extremity muscle strengthening.  She was given a handout of knee joint exercises to perform.  She declined a cortisone injection today.  We will apply for Visco gel injections for the right knee joint.  She is not experiencing any other joint pain or inflammation at this time.  She will continue on methotrexate and Plaquenil as prescribed.  She does not need any refills at this time.  She will follow-up in the office in 3 months.  High risk medication use - Methotrexate 2.5 mg 7 tablets every 7 days, folic acid 1 mg 2 tablets daily, and Plaquenil 200 mg 1 tablet by mouth daily. PLQ Eye Exam: 02/11/2020. CBC and CMP were drawn on 09/14/2020.  She is due to update lab work today.  Orders for CBC and CMP released.  Her next lab work will be due in April and every 3 months to monitor for drug toxicity.  Standing orders for CBC and CMP are in place.    - Plan: CBC with Differential/Platelet, COMPLETE METABOLIC PANEL WITH GFR She has not had any recent infections.  She has received all 3 COVID-19 [place] vaccinations.  She was advised to hold methotrexate 1 week after receiving the fourth vaccine dose. Discussed the importance of holding methotrexate if she develops signs or symptoms of infection and to resume once the infection has completely cleared.  Primary  osteoarthritis of right knee - X-rays of the right knee revealed moderate osteoarthritis and moderate chondromalacia patella.  MRI of the right knee was performed on 08/24/2020 which revealed a small joint effusion and synovitis.  No meniscal tear  noted.  She had a cortisone injection performed on 08/17/2020 which provided about 80 to 85% improvement in her pain.  Since then she has started to have some increased discomfort and has experienced a buckling sensation intermittently.  She has not had any recent falls.  She has not been experiencing any locking or catching sensation.  On examination today she has full range of motion of the right knee joint with no warmth or effusion.  Different treatment options were discussed in detail today.  She declined a cortisone injection.  We will apply for Visco gel injections for the right knee.  She was given a handout of information about Euflexxa to review. She was also given a handout of knee stretching and strengthening exercises.  Pain in left ankle and joints of left foot - Resolved.  DDD (degenerative disc disease), lumbar: She is not having any increased discomfort in her lower back at this time.  She has seen Dr. Regino Schultze in the past  Chronic right SI joint pain: Resolved   Age-related osteoporosis without current pathological fracture - DEXA ordered by Dr. Parke Simmers on 06/06/2020 AP spine BMD 0.871 with T score -2.5.  She receives Prolia 60 mg subcutaneous injections every 6 months.  Other medical conditions are listed as follows:   History of malignant neoplasm of breast  Morton's neuroma, unspecified laterality  History of hypertension  Positive PPD, treated  History of pityriasis rosea  Orders: Orders Placed This Encounter  Procedures  . CBC with Differential/Platelet  . COMPLETE METABOLIC PANEL WITH GFR   No orders of the defined types were placed in this encounter.   Follow-Up Instructions: Return in 3 months (on 03/15/2021) for Rheumatoid  arthritis, Osteoarthritis, DDD.   Gearldine Bienenstock, PA-C  Note - This record has been created using Dragon software.  Chart creation errors have been sought, but may not always  have been located. Such creation errors do not reflect on  the standard of medical care.

## 2020-12-08 ENCOUNTER — Other Ambulatory Visit: Payer: Self-pay

## 2020-12-08 ENCOUNTER — Encounter: Payer: Self-pay | Admitting: Occupational Therapy

## 2020-12-08 ENCOUNTER — Ambulatory Visit: Payer: Medicare PPO | Attending: Orthopaedic Surgery | Admitting: Occupational Therapy

## 2020-12-08 DIAGNOSIS — R29898 Other symptoms and signs involving the musculoskeletal system: Secondary | ICD-10-CM | POA: Diagnosis not present

## 2020-12-08 DIAGNOSIS — M25531 Pain in right wrist: Secondary | ICD-10-CM | POA: Diagnosis not present

## 2020-12-08 DIAGNOSIS — R600 Localized edema: Secondary | ICD-10-CM | POA: Insufficient documentation

## 2020-12-08 DIAGNOSIS — M6281 Muscle weakness (generalized): Secondary | ICD-10-CM | POA: Insufficient documentation

## 2020-12-08 DIAGNOSIS — Z9181 History of falling: Secondary | ICD-10-CM | POA: Insufficient documentation

## 2020-12-08 NOTE — Patient Instructions (Signed)
AROM: Forearm Pronation / Supination    With right arm in handshake position, slowly rotate palm down until stretch is felt. Relax. Then rotate palm up until stretch is felt. Repeat __25__ times per set. Do __1__ sets per session. Do __2-4__ sessions per day.    PROM: Wrist Flexion / Extension    Grasp right hand and slowly bend wrist until stretch is felt. Relax. Then stretch as far as possible in opposite direction. Be sure to keep elbow bent. Repeat __10__ times per set. Do __1__ sets per session. Do ____ sessions per day.    PROM: Wrist Radial / Ulnar Deviation    Grasp right hand with other hand and gently stretch hand and wrist from side to side. Hold each position __5__ seconds. Relax. Repeat __10__ times per set. Do __1__ sets per session. Do __2__ sessions per day.

## 2020-12-08 NOTE — Therapy (Signed)
[place] [place], [place], [address] Phone: [phone]   Fax:  [phone]  Occupational Therapy Treatment  Patient Details  Name: Melissa Perez MRN: [id number] Date of Birth: [birth year] Referring Provider (OT): Leandrew Koyanagi, MD   Encounter Date: 12/08/2020   OT End of Session - 12/08/20 0945    Visit Number 4    Number of Visits 17    Date for OT Re-Evaluation 01/19/21    Authorization Type [employer]    Progress Note Due on Visit 10    OT Start Time [phone]    OT Stop Time 1017    OT Time Calculation (min) 43 min    Activity Tolerance Patient tolerated treatment well;Other (comment)   Pt limited by soreness/tenderness   Behavior During Therapy [facility] for tasks assessed/performed           Past Medical History:  Diagnosis Date  . Arthritis    rheumatoid, take Humira  . Arthritis    rheumatoid  . Arthritis pain   . Back pain   . Breast cancer ([place])   . Cancer ([place])   . History of dental surgery   . Hyperlipidemia   . Hypertension   . Personal history of radiation therapy   . Rash   . Rheumatoid arthritis ([place])   . S/P lumpectomy of breast 12/03/2011    Past Surgical History:  Procedure Laterality Date  . ABDOMINAL HYSTERECTOMY    . APPENDECTOMY    . BREAST EXCISIONAL BIOPSY Left 1991   benign  . BREAST LUMPECTOMY Right 11/2011  . BREAST SURGERY     2 left breast biopsies-benign  . left breast biopsies  1991 & 1990    There were no vitals filed for this visit.   Subjective Assessment - 12/08/20 0937    Subjective  Pt believes her extended amount of driving recently has increased soreness in her wrist    Pertinent History rheumatoid arthritis    Special Tests AROM    Patient Stated Goals "Minimize damage to soft tissue," improve grasp strength, and manage pain    Currently in Pain? No/denies    Pain Score 6     Pain Location Wrist    Pain Orientation Right    Pain Descriptors /  Indicators Sore    Pain Type Chronic pain    Pain Onset More than a month ago    Pain Frequency Constant            OT Treatments/Exercises (OP) - 12/08/20 1209      Elbow Exercises   Other elbow exercises AROM of R forearm pro/sup within pain-free range and with elbow flexed completed 10x1   OT instructed pt to include exercise in HEP     Wrist Exercises   Other wrist exercises Self-PROM of R wrist flex/ext and radial/ulnar deviation completed 15x1 each; OT provided mod v/c to keep movements within pain-free range, complete exercises slowly, and to maintain relaxed R wrist throughout, allowing contralateral hand to facilitate movement; pt able to complete exercises without difficulty   OT instructed pt to include exercises in HEP     Moist Heat Therapy   Number Minutes Moist Heat 5 Minutes    Moist Heat Location Wrist   AROM of wrist ulnar and radial deviation completed with moist heat to facilitate pain-free ROM and ease of movement     Manual Therapy   Edema Management Gentle massage completed in R hand and  wrist   pt reported mild tenderness to palpation of ulnar side of palmar and dorsal wrist and forearm           OT Education - 12/08/20 0945    Education Details OT provided education on edema reduction and OT correspondence with referring ortho md regarding initiation of PROM    Person(s) Educated Patient    Methods Explanation    Comprehension Verbalized understanding;Returned demonstration            OT Short Term Goals - 11/24/20 1101      OT SHORT TERM GOAL #1   Title Pt will improve participation in grooming activities as evidenced by increasing AROM of wrist extension by at least 10 degrees.    Baseline R wrist extension at 55 degrees (~70 is [facility])    Time 4    Period Weeks    Status On-going    Target Date 12/22/20      OT SHORT TERM GOAL #2   Title Pt will identify and demonstrate understanding of at least 3 pain management strategies to utilize at home.     Baseline Does not currently have pain mgmt strategies.    Time 4    Period Weeks    Status On-going      OT SHORT TERM GOAL #3   Title Pt will report implementation of at least 2 safety strategies for improving balance and safety at home to aid with fall prevention.   Introduced fall prevention strategies   Baseline Injury sustained due to a fall; pt reports multiple falls with the past 5 months.    Time 4    Period Weeks    Status On-going             OT Long Term Goals - 11/24/20 1101      OT LONG TERM GOAL #1   Title Pt will improve participation in BADLs as evidenced by increasing AROM of wrist extension to [facility] and forearm supination by at least 10 degrees with minimal pain.    Baseline Wrist extension at 55 degrees; Forearm sup at 60 (80-90 is WNL)    Time 8    Period Weeks    Status On-going      OT LONG TERM GOAL #2   Title Pt will demonstrate independence with HEP and self-report compliance at least 75% of the time at home.   Initiated HEP   Baseline No HEP.    Time 8    Period Weeks    Status On-going      OT LONG TERM GOAL #3   Title Pt will demonstrate understanding of compensatory strategies, including use of assistive devices, 100% of the time to improve participation in light housekeeping and meal prep activities.    Baseline No compensatory strategies.    Time 8    Period Weeks    Status On-going      OT LONG TERM GOAL #4   Title Pt will open small-medium containers in 4/4 trials and report pain less than 5/10 to improve functional grip during IADLs    Baseline Pt reports difficulty opening containers 100% of the time    Time 8    Period Weeks    Status On-going             Plan - 12/08/20 0947    Clinical Impression Statement OT contacted pt's orthopedic provider for medical clearance of PROM due to persistent pain in wrist; physician confirmed clearance. Due to continued  soreness and tenderness of R wrist, self-PROM of wrist was initiated and  added to current HEP. Pt was able to tolerate activity without significant increase in pain and required min v/c to complete movements within pain-free range.    OT Occupational Profile and History Problem Focused Assessment - Including review of records relating to presenting problem    Occupational performance deficits (Please refer to evaluation for details): ADL's;IADL's;Leisure;Other    Body Structure / Function / Physical Skills ADL;Decreased knowledge of use of DME;Strength;GMC;Pain;Edema;Body mechanics;UE functional use;IADL;ROM;Coordination;FMC;Balance    Psychosocial Skills Environmental  Adaptations    Rehab Potential Good    Clinical Decision Making Several treatment options, min-mod task modification necessary    Comorbidities Affecting Occupational Performance: May have comorbidities impacting occupational performance    Modification or Assistance to Complete Evaluation  Min-Moderate modification of tasks or assist with assess necessary to complete eval    OT Frequency 2x / week    OT Duration 8 weeks    OT Treatment/Interventions Self-care/ADL training;Moist Heat;Fluidtherapy;DME and/or AE instruction;Manual lymph drainage;Splinting;Contrast Bath;Compression bandaging;Therapeutic activities;Ultrasound;Therapeutic exercise;Cryotherapy;Iontophoresis;Passive range of motion;Electrical Stimulation;Energy conservation;Manual Therapy;Patient/family education;Functional Mobility Training    Plan address pain management and fall prevention strategies    Consulted and Agree with Plan of Care Patient           Patient will benefit from skilled therapeutic intervention in order to improve the following deficits and impairments:   Body Structure / Function / Physical Skills: ADL,Decreased knowledge of use of DME,Strength,GMC,Pain,Edema,Body mechanics,UE functional use,IADL,ROM,Coordination,FMC,Balance   Psychosocial Skills: Environmental  Adaptations   Visit Diagnosis: Other symptoms and  signs involving the musculoskeletal system  Muscle weakness (generalized)  Pain in right wrist  Localized edema    Problem List Patient Active Problem List   Diagnosis Date Noted  . Fracture, [place], right, closed 11/16/2020  . Pruritus of scalp 06/20/2018  . DDD (degenerative disc disease), lumbar 05/27/2018  . Essential hypertension 03/19/2017  . History of pityriasis rosea 03/19/2017  . 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 History of malignant neoplasm of breast 03/19/2017  . Eczema 03/05/2017  . Vulvar dermatitis 03/05/2017  . Rheumatoid arthritis  10/15/2016  . High risk medication use 10/15/2016  . 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 S/P lumpectomy of breast 12/03/2011  . 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HCC) 11/01/2011    Rosie Fate, OTR/L, MSOT 12/08/2020, 12:31 PM  [facility] [address] [place], [place], [address] Phone: [phone]   Fax:  [phone]  Name: AMAMDA CURBOW MRN: [id number] Date of Birth: 11/05/1946

## 2020-12-13 ENCOUNTER — Ambulatory Visit: Payer: Medicare PPO | Admitting: Occupational Therapy

## 2020-12-13 ENCOUNTER — Encounter: Payer: Self-pay | Admitting: Occupational Therapy

## 2020-12-13 ENCOUNTER — Other Ambulatory Visit: Payer: Self-pay

## 2020-12-13 DIAGNOSIS — M6281 Muscle weakness (generalized): Secondary | ICD-10-CM | POA: Diagnosis not present

## 2020-12-13 DIAGNOSIS — Z9181 History of falling: Secondary | ICD-10-CM

## 2020-12-13 DIAGNOSIS — R29898 Other symptoms and signs involving the musculoskeletal system: Secondary | ICD-10-CM | POA: Diagnosis not present

## 2020-12-13 DIAGNOSIS — R600 Localized edema: Secondary | ICD-10-CM

## 2020-12-13 DIAGNOSIS — M25531 Pain in right wrist: Secondary | ICD-10-CM

## 2020-12-14 NOTE — Therapy (Signed)
[place]. [place], [place], [address] Phone: [phone]   Fax:  [phone]  Occupational Therapy Treatment  Patient Details  Name: Melissa Perez MRN: [id number] Date of Birth: 07-20-1946 Referring Provider (OT): Leandrew Koyanagi, MD   Encounter Date: 12/13/2020   OT End of Session - 12/13/20 0951    Visit Number 5    Number of Visits 17    Date for OT Re-Evaluation 01/19/21    Authorization Type [employer]    Progress Note Due on Visit 10    OT Start Time 0932    OT Stop Time 1017    OT Time Calculation (min) 45 min    Activity Tolerance Patient tolerated treatment well    Behavior During Therapy [facility] for tasks assessed/performed           Past Medical History:  Diagnosis Date  . Arthritis    rheumatoid, take Humira  . Arthritis    rheumatoid  . Arthritis pain   . Back pain   . Breast cancer ([place])   . Cancer ([place])   . History of dental surgery   . Hyperlipidemia   . Hypertension   . Personal history of radiation therapy   . Rash   . Rheumatoid arthritis ([place])   . S/P lumpectomy of breast 12/03/2011    Past Surgical History:  Procedure Laterality Date  . ABDOMINAL HYSTERECTOMY    . APPENDECTOMY    . BREAST EXCISIONAL BIOPSY Left 1991   benign  . BREAST LUMPECTOMY Right 11/2011  . BREAST SURGERY     2 left breast biopsies-benign  . left breast biopsies  1991 & 1990    There were no vitals filed for this visit.   Subjective Assessment - 12/13/20 0949    Subjective  Pt reports not receiving a handout for exercises added to HEP last week and had difficulty remembering all of the exercises    Pertinent History rheumatoid arthritis    Special Tests AROM    Patient Stated Goals "Minimize damage to soft tissue," improve grasp strength, and manage pain    Currently in Pain? No/denies            OT Treatments/Exercises (OP) - 12/13/20 0954      Wrist Exercises   Wrist Flexion  PROM;Right   OT facilitated PROM of wrist flexion x5 and extension x5 within pain-free ROM; pt tolerated exercise with no difficulty   Wrist Radial Deviation PROM;Right   OT facilitated PROM of wrist radial/ulnar deviation x5 each within pain-free ROM; pt tolerated exercise with no difficulty   Other wrist exercises Jux-a-Cisor wrist maze exerciser used to facilitate R wrist ROM in all planes; pt able to complete activity without difficulty and reported increased ease of movement and no pain   10x     Hand Exercises   Other Hand Exercises Gross grasp of hand exerciser (soft); 10x2. Pt reported no pain and was instructed to complete exercise within tolerable level of discomfort      Moist Heat Therapy   Number Minutes Moist Heat 7 Minutes    Moist Heat Location Wrist      Splinting   Splinting Pt brought in self-purchased compression wrist brace and OT recommended potential wearing schedule for edema and pain management            OT Education - 12/14/20 0904    Education Details OT reviewed all current HEP exercises and assisted pt with  appropriate hand placement/movement patterns during each exercise   Wrist AROM flexion/extension and radial/ulnar deviation; Forearm AAROM supination/pronation with ball; Wrist PROM flexion/extension and radial/ulnar deviation   Person(s) Educated Patient    Methods Explanation;Demonstration;Handout    Comprehension Verbalized understanding;Returned demonstration;Need further instruction            OT Short Term Goals - 11/24/20 1101      OT SHORT TERM GOAL #1   Title Pt will improve participation in grooming activities as evidenced by increasing AROM of wrist extension by at least 10 degrees.    Baseline R wrist extension at 55 degrees (~70 is [facility])    Time 4    Period Weeks    Status On-going    Target Date 12/22/20      OT SHORT TERM GOAL #2   Title Pt will identify and demonstrate understanding of at least 3 pain management strategies to  utilize at home.    Baseline Does not currently have pain mgmt strategies.    Time 4    Period Weeks    Status On-going      OT SHORT TERM GOAL #3   Title Pt will report implementation of at least 2 safety strategies for improving balance and safety at home to aid with fall prevention.   Introduced fall prevention strategies   Baseline Injury sustained due to a fall; pt reports multiple falls with the past 5 months.    Time 4    Period Weeks    Status On-going            OT Long Term Goals - 11/24/20 1101      OT LONG TERM GOAL #1   Title Pt will improve participation in BADLs as evidenced by increasing AROM of wrist extension to [facility] and forearm supination by at least 10 degrees with minimal pain.    Baseline Wrist extension at 55 degrees; Forearm sup at 60 (80-90 is WNL)    Time 8    Period Weeks    Status On-going      OT LONG TERM GOAL #2   Title Pt will demonstrate independence with HEP and self-report compliance at least 75% of the time at home.   Initiated HEP   Baseline No HEP.    Time 8    Period Weeks    Status On-going      OT LONG TERM GOAL #3   Title Pt will demonstrate understanding of compensatory strategies, including use of assistive devices, 100% of the time to improve participation in light housekeeping and meal prep activities.    Baseline No compensatory strategies.    Time 8    Period Weeks    Status On-going      OT LONG TERM GOAL #4   Title Pt will open small-medium containers in 4/4 trials and report pain less than 5/10 to improve functional grip during IADLs    Baseline Pt reports difficulty opening containers 100% of the time    Time 8    Period Weeks    Status On-going            Plan - 12/13/20 1610    Clinical Impression Statement R wrist AROM and PROM in all planes have improved from previous session. Edema and soreness continue to be present, but pt reports decreased discomfort in R wrist, which improved participation in therapeutic  exercises during session.    OT Occupational Profile and History Problem Focused Assessment - Including review of records  relating to presenting problem    Occupational performance deficits (Please refer to evaluation for details): ADL's;IADL's;Leisure;Other    Body Structure / Function / Physical Skills ADL;Decreased knowledge of use of DME;Strength;GMC;Pain;Edema;Body mechanics;UE functional use;IADL;ROM;Coordination;FMC;Balance    Psychosocial Skills Environmental  Adaptations    Rehab Potential Good    Clinical Decision Making Several treatment options, min-mod task modification necessary    Comorbidities Affecting Occupational Performance: May have comorbidities impacting occupational performance    Modification or Assistance to Complete Evaluation  Min-Moderate modification of tasks or assist with assess necessary to complete eval    OT Frequency 2x / week    OT Duration 8 weeks    OT Treatment/Interventions Self-care/ADL training;Moist Heat;Fluidtherapy;DME and/or AE instruction;Manual lymph drainage;Splinting;Contrast Bath;Compression bandaging;Therapeutic activities;Ultrasound;Therapeutic exercise;Cryotherapy;Iontophoresis;Passive range of motion;Electrical Stimulation;Energy conservation;Manual Therapy;Patient/family education;Functional Mobility Training    Plan continue poc; revisit fall prevention and pain management strategies    Consulted and Agree with Plan of Care Patient           Patient will benefit from skilled therapeutic intervention in order to improve the following deficits and impairments:   Body Structure / Function / Physical Skills: ADL,Decreased knowledge of use of DME,Strength,GMC,Pain,Edema,Body mechanics,UE functional use,IADL,ROM,Coordination,FMC,Balance   Psychosocial Skills: Environmental  Adaptations   Visit Diagnosis: Other symptoms and signs involving the musculoskeletal system  Muscle weakness (generalized)  Pain in right wrist  Localized  edema  History of falling    Problem List Patient Active Problem List   Diagnosis Date Noted  . Fracture, [place], right, closed 11/16/2020  . Pruritus of scalp 06/20/2018  . DDD (degenerative disc disease), lumbar 05/27/2018  . Essential hypertension 03/19/2017  . History of pityriasis rosea 03/19/2017  . 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 History of malignant neoplasm of breast 03/19/2017  . Eczema 03/05/2017  . Vulvar dermatitis 03/05/2017  . Rheumatoid arthritis  10/15/2016  . High risk medication use 10/15/2016  . 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 S/P lumpectomy of breast 12/03/2011  . 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HCC) 11/01/2011    Rosie Fate, OTR/L, MSOT 12/14/2020, 11:20 AM  [facility] [address] [place], [place], [address] Phone: [phone]   Fax:  [phone]  Name: Melissa Perez MRN: [id number] Date of Birth: [birth year]

## 2020-12-15 ENCOUNTER — Encounter: Payer: Self-pay | Admitting: Physician Assistant

## 2020-12-15 ENCOUNTER — Ambulatory Visit: Payer: Medicare PPO | Admitting: Physician Assistant

## 2020-12-15 ENCOUNTER — Ambulatory Visit: Payer: Medicare PPO | Admitting: Occupational Therapy

## 2020-12-15 ENCOUNTER — Other Ambulatory Visit: Payer: Self-pay

## 2020-12-15 ENCOUNTER — Encounter: Payer: Self-pay | Admitting: Occupational Therapy

## 2020-12-15 ENCOUNTER — Telehealth: Payer: Self-pay

## 2020-12-15 VITALS — BP 125/64 | HR 82 | Resp 15 | Ht 61.75 in | Wt 141.0 lb

## 2020-12-15 DIAGNOSIS — M25531 Pain in right wrist: Secondary | ICD-10-CM

## 2020-12-15 DIAGNOSIS — R600 Localized edema: Secondary | ICD-10-CM | POA: Diagnosis not present

## 2020-12-15 DIAGNOSIS — R7611 Nonspecific reaction to tuberculin skin test without active tuberculosis: Secondary | ICD-10-CM

## 2020-12-15 DIAGNOSIS — M0579 Rheumatoid arthritis with rheumatoid factor of multiple sites without organ or systems involvement: Secondary | ICD-10-CM | POA: Diagnosis not present

## 2020-12-15 DIAGNOSIS — Z9181 History of falling: Secondary | ICD-10-CM | POA: Diagnosis not present

## 2020-12-15 DIAGNOSIS — M810 Age-related osteoporosis without current pathological fracture: Secondary | ICD-10-CM | POA: Diagnosis not present

## 2020-12-15 DIAGNOSIS — R29898 Other symptoms and signs involving the musculoskeletal system: Secondary | ICD-10-CM

## 2020-12-15 DIAGNOSIS — Z8679 Personal history of other diseases of the circulatory system: Secondary | ICD-10-CM

## 2020-12-15 DIAGNOSIS — M6281 Muscle weakness (generalized): Secondary | ICD-10-CM

## 2020-12-15 DIAGNOSIS — M1711 Unilateral primary osteoarthritis, right knee: Secondary | ICD-10-CM

## 2020-12-15 DIAGNOSIS — M25572 Pain in left ankle and joints of left foot: Secondary | ICD-10-CM

## 2020-12-15 DIAGNOSIS — M5136 Other intervertebral disc degeneration, lumbar region: Secondary | ICD-10-CM

## 2020-12-15 DIAGNOSIS — G8929 Other chronic pain: Secondary | ICD-10-CM

## 2020-12-15 DIAGNOSIS — Z79899 Other long term (current) drug therapy: Secondary | ICD-10-CM | POA: Diagnosis not present

## 2020-12-15 DIAGNOSIS — M533 Sacrococcygeal disorders, not elsewhere classified: Secondary | ICD-10-CM

## 2020-12-15 DIAGNOSIS — Z853 Personal history of malignant neoplasm of breast: Secondary | ICD-10-CM

## 2020-12-15 DIAGNOSIS — Z872 Personal history of diseases of the skin and subcutaneous tissue: Secondary | ICD-10-CM

## 2020-12-15 DIAGNOSIS — G5760 Lesion of plantar nerve, unspecified lower limb: Secondary | ICD-10-CM

## 2020-12-15 NOTE — Telephone Encounter (Signed)
Please apply for right knee euflexxa, per Taylor Dale, PA-C. Thanks!  

## 2020-12-15 NOTE — Patient Instructions (Addendum)
Knee Exercises Ask your health care provider which exercises are safe for you. Do exercises exactly as told by your health care provider and adjust them as directed. It is normal to feel mild stretching, pulling, tightness, or discomfort as you do these exercises. Stop right away if you feel sudden pain or your pain gets worse. Do not begin these exercises until told by your health care provider. Stretching and range-of-motion exercises These exercises warm up your muscles and joints and improve the movement and flexibility of your knee. These exercises also help to relieve pain and swelling. Knee extension, prone 1. Lie on your abdomen (prone position) on a bed. 2. Place your left / right knee just beyond the edge of the surface so your knee is not on the bed. You can put a towel under your left / right thigh just above your kneecap for comfort. 3. Relax your leg muscles and allow gravity to straighten your knee (extension). You should feel a stretch behind your left / right knee. 4. Hold this position for __________ seconds. 5. Scoot up so your knee is supported between repetitions. Repeat __________ times. Complete this exercise __________ times a day. Knee flexion, active 1. Lie on your back with both legs straight. If this causes back discomfort, bend your left / right knee so your foot is flat on the floor. 2. Slowly slide your left / right heel back toward your buttocks. Stop when you feel a gentle stretch in the front of your knee or thigh (flexion). 3. Hold this position for __________ seconds. 4. Slowly slide your left / right heel back to the starting position. Repeat __________ times. Complete this exercise __________ times a day.   Quadriceps stretch, prone 1. Lie on your abdomen on a firm surface, such as a bed or padded floor. 2. Bend your left / right knee and hold your ankle. If you cannot reach your ankle or pant leg, loop a belt around your foot and grab the belt  instead. 3. Gently pull your heel toward your buttocks. Your knee should not slide out to the side. You should feel a stretch in the front of your thigh and knee (quadriceps). 4. Hold this position for __________ seconds. Repeat __________ times. Complete this exercise __________ times a day.   Hamstring, supine 1. Lie on your back (supine position). 2. Loop a belt or towel over the ball of your left / right foot. The ball of your foot is on the walking surface, right under your toes. 3. Straighten your left / right knee and slowly pull on the belt to raise your leg until you feel a gentle stretch behind your knee (hamstring). ? Do not let your knee bend while you do this. ? Keep your other leg flat on the floor. 4. Hold this position for __________ seconds. Repeat __________ times. Complete this exercise __________ times a day. Strengthening exercises These exercises build strength and endurance in your knee. Endurance is the ability to use your muscles for a long time, even after they get tired. Quadriceps, isometric This exercise stretches the muscles in front of your thigh (quadriceps) without moving your knee joint (isometric). 1. Lie on your back with your left / right leg extended and your other knee bent. Put a rolled towel or small pillow under your knee if told by your health care provider. 2. Slowly tense the muscles in the front of your left / right thigh. You should see your kneecap slide up toward your   hip or see increased dimpling just above the knee. This motion will push the back of the knee toward the floor. 3. For __________ seconds, hold the muscle as tight as you can without increasing your pain. 4. Relax the muscles slowly and completely. Repeat __________ times. Complete this exercise __________ times a day.   Straight leg raises This exercise stretches the muscles in front of your thigh (quadriceps) and the muscles that move your hips (hip flexors). 1. Lie on your back  with your left / right leg extended and your other knee bent. 2. Tense the muscles in the front of your left / right thigh. You should see your kneecap slide up or see increased dimpling just above the knee. Your thigh may even shake a bit. 3. Keep these muscles tight as you raise your leg 4-6 inches (10-15 cm) off the floor. Do not let your knee bend. 4. Hold this position for __________ seconds. 5. Keep these muscles tense as you lower your leg. 6. Relax your muscles slowly and completely after each repetition. Repeat __________ times. Complete this exercise __________ times a day. Hamstring, isometric 1. Lie on your back on a firm surface. 2. Bend your left / right knee about __________ degrees. 3. Dig your left / right heel into the surface as if you are trying to pull it toward your buttocks. Tighten the muscles in the back of your thighs (hamstring) to "dig" as hard as you can without increasing any pain. 4. Hold this position for __________ seconds. 5. Release the tension gradually and allow your muscles to relax completely for __________ seconds after each repetition. Repeat __________ times. Complete this exercise __________ times a day. Hamstring curls If told by your health care provider, do this exercise while wearing ankle weights. Begin with __________ lb weights. Then increase the weight by 1 lb (0.5 kg) increments. Do not wear ankle weights that are more than __________ lb. 1. Lie on your abdomen with your legs straight. 2. Bend your left / right knee as far as you can without feeling pain. Keep your hips flat against the floor. 3. Hold this position for __________ seconds. 4. Slowly lower your leg to the starting position. Repeat __________ times. Complete this exercise __________ times a day.   Squats This exercise strengthens the muscles in front of your thigh and knee (quadriceps). 1. Stand in front of a table, with your feet and knees pointing straight ahead. You may rest  your hands on the table for balance but not for support. 2. Slowly bend your knees and lower your hips like you are going to sit in a chair. ? Keep your weight over your heels, not over your toes. ? Keep your lower legs upright so they are parallel with the table legs. ? Do not let your hips go lower than your knees. ? Do not bend lower than told by your health care provider. ? If your knee pain increases, do not bend as low. 3. Hold the squat position for __________ seconds. 4. Slowly push with your legs to return to standing. Do not use your hands to pull yourself to standing. Repeat __________ times. Complete this exercise __________ times a day. Wall slides This exercise strengthens the muscles in front of your thigh and knee (quadriceps). 1. Lean your back against a smooth wall or door, and walk your feet out 18-24 inches (46-61 cm) from it. 2. Place your feet hip-width apart. 3. Slowly slide down the wall or door   until your knees bend __________ degrees. Keep your knees over your heels, not over your toes. Keep your knees in line with your hips. 4. Hold this position for __________ seconds. Repeat __________ times. Complete this exercise __________ times a day.   Straight leg raises This exercise strengthens the muscles that rotate the leg at the hip and move it away from your body (hip abductors). 1. Lie on your side with your left / right leg in the top position. Lie so your head, shoulder, knee, and hip line up. You may bend your bottom knee to help you keep your balance. 2. Roll your hips slightly forward so your hips are stacked directly over each other and your left / right knee is facing forward. 3. Leading with your heel, lift your top leg 4-6 inches (10-15 cm). You should feel the muscles in your outer hip lifting. ? Do not let your foot drift forward. ? Do not let your knee roll toward the ceiling. 4. Hold this position for __________ seconds. 5. Slowly return your leg to the  starting position. 6. Let your muscles relax completely after each repetition. Repeat __________ times. Complete this exercise __________ times a day.   Straight leg raises This exercise stretches the muscles that move your hips away from the front of the pelvis (hip extensors). 1. Lie on your abdomen on a firm surface. You can put a pillow under your hips if that is more comfortable. 2. Tense the muscles in your buttocks and lift your left / right leg about 4-6 inches (10-15 cm). Keep your knee straight as you lift your leg. 3. Hold this position for __________ seconds. 4. Slowly lower your leg to the starting position. 5. Let your leg relax completely after each repetition. Repeat __________ times. Complete this exercise __________ times a day. This information is not intended to replace advice given to you by your health care provider. Make sure you discuss any questions you have with your health care provider. Document Revised: 09/09/2018 Document Reviewed: 09/09/2018 Elsevier Patient Education  2021 [employer] We placed an order today for your standing lab work.   Please have your standing labs drawn in April and every 3 months   If possible, please have your labs drawn 2 weeks prior to your appointment so that the provider can discuss your results at your appointment.  We have open lab daily Monday through Thursday from 8:30-12:30 PM and 1:30-4:30 PM and Friday from 8:30-12:30 PM and 1:30-4:00 PM at the office of Dr. Bo Merino, [facility].   Please be advised, patients with office appointments requiring lab work will take precedents over walk-in lab work.  If possible, please come for your lab work on Monday and Friday afternoons, as you may experience shorter wait times. The office is located at [address], [place], [place], [place] [address] No appointment is necessary.   Labs are drawn by [facility]. Please bring your co-pay at the  time of your lab draw.  You may receive a bill from [facility] for your lab work.  If you wish to have your labs drawn at another location, please call the office 24 hours in advance to send orders.  If you have any questions regarding directions or hours of operation,  please call [phone].   As a reminder, please drink plenty of water prior to coming for your lab work. Thanks!

## 2020-12-15 NOTE — Patient Instructions (Signed)
1. Grip Strengthening (Resistive Putty)      Squeeze putty using thumb and all fingers.  Repeat 15 times. Do 2-3 sessions per day.   2. Putty Pulling  Pull putty apart with both hands starting together and pulling apart in opposite directions. Do this 10 times. 2-3 times a day.

## 2020-12-16 LAB — COMPLETE METABOLIC PANEL WITH GFR
AG Ratio: 2 (calc) (ref 1.0–2.5)
ALT: 21 U/L (ref 6–29)
AST: 28 U/L (ref 10–35)
Albumin: 4.3 g/dL (ref 3.6–5.1)
Alkaline phosphatase (APISO): 59 U/L (ref 37–153)
BUN: 14 mg/dL (ref 7–25)
CO2: 30 mmol/L (ref 20–32)
Calcium: 9.7 mg/dL (ref 8.6–10.4)
Chloride: 101 mmol/L (ref 98–110)
Creat: 0.85 mg/dL (ref 0.60–0.93)
GFR, Est African American: 79 mL/min/{1.73_m2} (ref >=60)
GFR, Est Non African American: 68 mL/min/{1.73_m2} (ref >=60)
Globulin: 2.1 g/dL (calc) (ref 1.9–3.7)
Glucose, Bld: 92 mg/dL (ref 65–99)
Potassium: 5 mmol/L (ref 3.5–5.3)
Sodium: 140 mmol/L (ref 135–146)
Total Bilirubin: 0.5 mg/dL (ref 0.2–1.2)
Total Protein: 6.4 g/dL (ref 6.1–8.1)

## 2020-12-16 LAB — CBC WITH DIFFERENTIAL/PLATELET
Absolute Monocytes: 435 cells/uL (ref 200–950)
Basophils Absolute: 48 cells/uL (ref 0–200)
Basophils Relative: 0.9 %
Eosinophils Absolute: 111 cells/uL (ref 15–500)
Eosinophils Relative: 2.1 %
HCT: 36.5 % (ref 35.0–45.0)
Hemoglobin: 12.6 g/dL (ref 11.7–15.5)
Lymphs Abs: 1410 cells/uL (ref 850–3900)
MCH: 35.1 pg — ABNORMAL HIGH (ref 27.0–33.0)
MCHC: 34.5 g/dL (ref 32.0–36.0)
MCV: 101.7 fL — ABNORMAL HIGH (ref 80.0–100.0)
MPV: 10.3 fL (ref 7.5–12.5)
Monocytes Relative: 8.2 %
Neutro Abs: 3297 cells/uL (ref 1500–7800)
Neutrophils Relative %: 62.2 %
Platelets: 220 10*3/uL (ref 140–400)
RBC: 3.59 10*6/uL — ABNORMAL LOW (ref 3.80–5.10)
RDW: 15.4 % — ABNORMAL HIGH (ref 11.0–15.0)
Total Lymphocyte: 26.6 %
WBC: 5.3 10*3/uL (ref 3.8–10.8)

## 2020-12-16 NOTE — Telephone Encounter (Signed)
Submitted for VOB on 12/16/2020.

## 2020-12-16 NOTE — Therapy (Signed)
[facility] [place], [place], [address] Phone: [phone]   Fax:  [phone]  Occupational Therapy Treatment  Patient Details  Name: Melissa Perez MRN: [id number] Date of Birth: 09-12-1946 Referring Provider (OT): Leandrew Koyanagi, MD   Encounter Date: 12/15/2020   OT End of Session - 12/15/20 1104    Visit Number 6    Number of Visits 17    Date for OT Re-Evaluation 01/19/21    Authorization Type [employer]    Progress Note Due on Visit 10    OT Start Time 1100    OT Stop Time 1145    OT Time Calculation (min) 45 min    Activity Tolerance Patient tolerated treatment well    Behavior During Therapy [facility] for tasks assessed/performed           Past Medical History:  Diagnosis Date  . Arthritis    rheumatoid, take Humira  . Arthritis    rheumatoid  . Arthritis pain   . Back pain   . Breast cancer ([place])   . Cancer ([place])   . History of dental surgery   . Hyperlipidemia   . Hypertension   . Personal history of radiation therapy   . Rash   . Rheumatoid arthritis ([place])   . S/P lumpectomy of breast 12/03/2011    Past Surgical History:  Procedure Laterality Date  . ABDOMINAL HYSTERECTOMY    . APPENDECTOMY    . BREAST EXCISIONAL BIOPSY Left 1991   benign  . BREAST LUMPECTOMY Right 11/2011  . BREAST SURGERY     2 left breast biopsies-benign  . left breast biopsies  1991 & 1990    There were no vitals filed for this visit.   Subjective Assessment - 12/15/20 1102    Subjective  Pt reports having a rheumatology appt this morning; "I've been poked and proded all morning"    Pertinent History rheumatoid arthritis    Special Tests AROM    Patient Stated Goals "Minimize damage to soft tissue," improve grasp strength, and manage pain    Currently in Pain? No/denies            OT Treatments/Exercises (OP) - 12/16/20 0801      Wrist Exercises   Other wrist exercises AROM; wrist flex/ext,  radial/ulnar deviation, forearm pro/sup   Education provided on continued benefit of exercises; measurements taken to assess progress     Theraputty   Theraputty - Grip Full grip and stretching putty with forearms pronated completed 10x each with yellow putty   Putty administered to pt; Pt instructed to included putty exercises into HEP and reported not needing an updated handout     RUE Paraffin   Number Minutes Paraffin 7 Minutes    RUE Paraffin Location --   Hand and Wrist   Comments After 5 dips, pt reported paraffin felt too hot and re-dipping into bath was discontinued; pt reported being comfortable with level of heat once settled in resting position      Manual Therapy   Edema Management Gentle massage completed in R hand and wrist   pt reported tenderness to palpation of ulnar side of wrist and forearm           OT Education - 12/16/20 0849    Education Details Education provided on benefit of current HEP exercises and pt's current progress, as well as progression of treatment plan and benefits of including stregthening; Benefits and purpose of paraffin  bath discussed   Wrist AROM flexion/extension and radial/ulnar deviation; Forearm AAROM supination/pronation with ball; Wrist PROM flexion/extension and radial/ulnar deviation; putty full grip and pulling apart   Person(s) Educated Patient    Methods Explanation    Comprehension Verbalized understanding;Need further instruction            OT Short Term Goals - 11/24/20 1101      OT SHORT TERM GOAL #1   Title Pt will improve participation in grooming activities as evidenced by increasing AROM of wrist extension by at least 10 degrees.    Baseline R wrist extension at 55 degrees (~70 is [facility])    Time 4    Period Weeks    Status On-going    Target Date 12/22/20      OT SHORT TERM GOAL #2   Title Pt will identify and demonstrate understanding of at least 3 pain management strategies to utilize at home.    Baseline Does not  currently have pain mgmt strategies.    Time 4    Period Weeks    Status On-going      OT SHORT TERM GOAL #3   Title Pt will report implementation of at least 2 safety strategies for improving balance and safety at home to aid with fall prevention.   Introduced fall prevention strategies   Baseline Injury sustained due to a fall; pt reports multiple falls with the past 5 months.    Time 4    Period Weeks    Status On-going            OT Long Term Goals - 11/24/20 1101      OT LONG TERM GOAL #1   Title Pt will improve participation in BADLs as evidenced by increasing AROM of wrist extension to [facility] and forearm supination by at least 10 degrees with minimal pain.    Baseline Wrist extension at 55 degrees; Forearm sup at 60 (80-90 is WNL)    Time 8    Period Weeks    Status On-going      OT LONG TERM GOAL #2   Title Pt will demonstrate independence with HEP and self-report compliance at least 75% of the time at home.   Initiated HEP   Baseline No HEP.    Time 8    Period Weeks    Status On-going      OT LONG TERM GOAL #3   Title Pt will demonstrate understanding of compensatory strategies, including use of assistive devices, 100% of the time to improve participation in light housekeeping and meal prep activities.    Baseline No compensatory strategies.    Time 8    Period Weeks    Status On-going      OT LONG TERM GOAL #4   Title Pt will open small-medium containers in 4/4 trials and report pain less than 5/10 to improve functional grip during IADLs    Baseline Pt reports difficulty opening containers 100% of the time    Time 8    Period Weeks    Status On-going            Plan - 12/16/20 0851    Clinical Impression Statement Progress measurements taken for wrist/forearm ROM; pt demonstrated improved forearm supination and wrist radial deviation with wrist flexion decreasing. Light strengthening using resistive putty initiated with pt reporting no difficulties or  discomfort with exercises. Pt continues to report carryover with HEP and reports noticing improvements with functional use and strength.  OT Occupational Profile and History Problem Focused Assessment - Including review of records relating to presenting problem    Occupational performance deficits (Please refer to evaluation for details): ADL's;IADL's;Leisure;Other    Body Structure / Function / Physical Skills ADL;Decreased knowledge of use of DME;Strength;GMC;Pain;Edema;Body mechanics;UE functional use;IADL;ROM;Coordination;FMC;Balance    Psychosocial Skills Environmental  Adaptations    Rehab Potential Good    Clinical Decision Making Several treatment options, min-mod task modification necessary    Comorbidities Affecting Occupational Performance: May have comorbidities impacting occupational performance    Modification or Assistance to Complete Evaluation  Min-Moderate modification of tasks or assist with assess necessary to complete eval    OT Frequency 2x / week    OT Duration 8 weeks    OT Treatment/Interventions Self-care/ADL training;Moist Heat;Fluidtherapy;DME and/or AE instruction;Manual lymph drainage;Splinting;Contrast Bath;Compression bandaging;Therapeutic activities;Ultrasound;Therapeutic exercise;Cryotherapy;Iontophoresis;Passive range of motion;Electrical Stimulation;Energy conservation;Manual Therapy;Patient/family education;Functional Mobility Training    Plan continue poc; revisit fall prevention and pain management strategies    Consulted and Agree with Plan of Care Patient           Patient will benefit from skilled therapeutic intervention in order to improve the following deficits and impairments:   Body Structure / Function / Physical Skills: ADL,Decreased knowledge of use of DME,Strength,GMC,Pain,Edema,Body mechanics,UE functional use,IADL,ROM,Coordination,FMC,Balance   Psychosocial Skills: Environmental  Adaptations   Visit Diagnosis: Pain in right  wrist  Other symptoms and signs involving the musculoskeletal system  Muscle weakness (generalized)  Localized edema  History of falling    Problem List Patient Active Problem List   Diagnosis Date Noted  . Fracture, [place], right, closed 11/16/2020  . Pruritus of scalp 06/20/2018  . DDD (degenerative disc disease), lumbar 05/27/2018  . Essential hypertension 03/19/2017  . History of pityriasis rosea 03/19/2017  . 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 History of malignant neoplasm of breast 03/19/2017  . Eczema 03/05/2017  . Vulvar dermatitis 03/05/2017  . Rheumatoid arthritis  10/15/2016  . High risk medication use 10/15/2016  . 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 S/P lumpectomy of breast 12/03/2011  . 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[place]) 11/01/2011     Kathrine Cords, OTR/L, MSOT 12/16/2020, 9:12 AM  [facility] [place], [place], [address] Phone: [phone]   Fax:  [phone]  Name: Melissa Perez MRN: [id number] Date of Birth: Jun 12, 1946

## 2020-12-16 NOTE — Progress Notes (Signed)
CMP WNL.  MCV and MCH remain borderline elevated.  Please make sure the patient is taking folic acid 2 mg daily.  We will continue to monitor.

## 2020-12-20 ENCOUNTER — Ambulatory Visit: Payer: Medicare PPO | Admitting: Occupational Therapy

## 2020-12-22 ENCOUNTER — Ambulatory Visit: Payer: Medicare PPO | Admitting: Occupational Therapy

## 2020-12-22 ENCOUNTER — Encounter: Payer: Self-pay | Admitting: Occupational Therapy

## 2020-12-22 ENCOUNTER — Other Ambulatory Visit: Payer: Self-pay

## 2020-12-22 DIAGNOSIS — M25531 Pain in right wrist: Secondary | ICD-10-CM | POA: Diagnosis not present

## 2020-12-22 DIAGNOSIS — M6281 Muscle weakness (generalized): Secondary | ICD-10-CM

## 2020-12-22 DIAGNOSIS — R600 Localized edema: Secondary | ICD-10-CM | POA: Diagnosis not present

## 2020-12-22 DIAGNOSIS — R29898 Other symptoms and signs involving the musculoskeletal system: Secondary | ICD-10-CM

## 2020-12-22 DIAGNOSIS — Z9181 History of falling: Secondary | ICD-10-CM

## 2020-12-22 NOTE — Telephone Encounter (Signed)
Please call to schedule patient's [place] knee injections.  Authorized for Orthovisc series right knee. [employer] and [employer] Deductible does not apply. PA thru [employer]. Authorization # [id number]  effective 12/16/2020 thru 12/01/2021 Patient responsible for $35.00 copay. Once OOP is met, insurance to cover 100%. (as of date $0 has been met of $3300.)

## 2020-12-22 NOTE — Therapy (Signed)
[place] [place], [place], [address] Phone: [phone]   Fax:  [phone]  Occupational Therapy Treatment  Patient Details  Name: Melissa Perez MRN: [id number] Date of Birth: [birth year] Referring Provider (OT): Leandrew Koyanagi, MD   Encounter Date: 12/22/2020   OT End of Session - 12/22/20 1604    Visit Number 7    Number of Visits 17    Date for OT Re-Evaluation 01/19/21    Authorization Type [employer]    Progress Note Due on Visit 10    OT Start Time [phone]    OT Stop Time 1016    OT Time Calculation (min) 43 min    Activity Tolerance Patient tolerated treatment well    Behavior During Therapy [facility] for tasks assessed/performed           Past Medical History:  Diagnosis Date  . Arthritis    rheumatoid, take Humira  . Arthritis    rheumatoid  . Arthritis pain   . Back pain   . Breast cancer ([place])   . Cancer ([place])   . History of dental surgery   . Hyperlipidemia   . Hypertension   . Personal history of radiation therapy   . Rash   . Rheumatoid arthritis ([place])   . S/P lumpectomy of breast 12/03/2011    Past Surgical History:  Procedure Laterality Date  . ABDOMINAL HYSTERECTOMY    . APPENDECTOMY    . BREAST EXCISIONAL BIOPSY Left 1991   benign  . BREAST LUMPECTOMY Right 11/2011  . BREAST SURGERY     2 left breast biopsies-benign  . left breast biopsies  1991 & 1990    There were no vitals filed for this visit.   Subjective Assessment - 12/22/20 1621    Subjective  "I feel like the soreness is covering a smaller area now"    Pertinent History rheumatoid arthritis    Special Tests AROM    Patient Stated Goals "Minimize damage to soft tissue," improve grasp strength, and manage pain    Currently in Pain? No/denies            OT Treatments/Exercises (OP) - 12/22/20 1003      ADLs   ADL Education Given Yes    Increased Safety Strategies Pt-specific fall prevention strategies  discussed; see 'Patient Instructions' and 'OT Education' sections for detail      RUE Paraffin   Number Minutes Paraffin 10 Minutes    RUE Paraffin Location Wrist    Comments Fall prevention education completed while resting with paraffin wax coating      Manual Therapy   Manual Therapy Passive ROM    Passive ROM OT facilitated gentle PROM of R forearm supination/pronation while holding stretch at end-range for 10-15 sec.   Pt able to complete PROM within tolerable level of pain and reported that pain did decrease as stretch was held; AROM of both sup/pro improved after stretching           OT Education - 12/22/20 1623    Education Details Education provided on fall prevention strategies and simple environmental modification, if warranted. OT also discussed/recommended doing PROM exercises prior to AROM exercises when completing HEP.   furniture placement, throw rugs, keeping the floor uncluttered, lighting, step stool use, overhead reaching, regularly walking, keeping vision rx up-to-date   Person(s) Educated Patient    Methods Explanation    Comprehension Verbalized understanding  OT Short Term Goals - 11/24/20 1101      OT SHORT TERM GOAL #1   Title Pt will improve participation in grooming activities as evidenced by increasing AROM of wrist extension by at least 10 degrees.    Baseline R wrist extension at 55 degrees (~70 is [facility])    Time 4    Period Weeks    Status On-going    Target Date 12/22/20      OT SHORT TERM GOAL #2   Title Pt will identify and demonstrate understanding of at least 3 pain management strategies to utilize at home.    Baseline Does not currently have pain mgmt strategies.    Time 4    Period Weeks    Status On-going      OT SHORT TERM GOAL #3   Title Pt will report implementation of at least 2 safety strategies for improving balance and safety at home to aid with fall prevention.   Introduced fall prevention strategies   Baseline Injury  sustained due to a fall; pt reports multiple falls with the past 5 months.    Time 4    Period Weeks    Status On-going            OT Long Term Goals - 11/24/20 1101      OT LONG TERM GOAL #1   Title Pt will improve participation in BADLs as evidenced by increasing AROM of wrist extension to [facility] and forearm supination by at least 10 degrees with minimal pain.    Baseline Wrist extension at 55 degrees; Forearm sup at 60 (80-90 is WNL)    Time 8    Period Weeks    Status On-going      OT LONG TERM GOAL #2   Title Pt will demonstrate independence with HEP and self-report compliance at least 75% of the time at home.   Initiated HEP   Baseline No HEP.    Time 8    Period Weeks    Status On-going      OT LONG TERM GOAL #3   Title Pt will demonstrate understanding of compensatory strategies, including use of assistive devices, 100% of the time to improve participation in light housekeeping and meal prep activities.    Baseline No compensatory strategies.    Time 8    Period Weeks    Status On-going      OT LONG TERM GOAL #4   Title Pt will open small-medium containers in 4/4 trials and report pain less than 5/10 to improve functional grip during IADLs    Baseline Pt reports difficulty opening containers 100% of the time    Time 8    Period Weeks    Status On-going            Plan - 12/22/20 1646    Clinical Impression Statement Pt was very receptive to fall prevention discussion and contributed other relevant areas of concern to problem solve. Wrist/forearm ROM continues to improve with PROM and stretching; heat therapy also appears beneficial. Follow-up visit with physician scheduled for 1/26.    OT Occupational Profile and History Problem Focused Assessment - Including review of records relating to presenting problem    Occupational performance deficits (Please refer to evaluation for details): ADL's;IADL's;Leisure;Other    Body Structure / Function / Physical Skills  ADL;Decreased knowledge of use of DME;Strength;GMC;Pain;Edema;Body mechanics;UE functional use;IADL;ROM;Coordination;FMC;Balance    Psychosocial Skills Environmental  Adaptations    Rehab Potential Good  Clinical Decision Making Several treatment options, min-mod task modification necessary    Comorbidities Affecting Occupational Performance: May have comorbidities impacting occupational performance    Modification or Assistance to Complete Evaluation  Min-Moderate modification of tasks or assist with assess necessary to complete eval    OT Frequency 2x / week    OT Duration 8 weeks    OT Treatment/Interventions Self-care/ADL training;Moist Heat;Fluidtherapy;DME and/or AE instruction;Manual lymph drainage;Splinting;Contrast Bath;Compression bandaging;Therapeutic activities;Ultrasound;Therapeutic exercise;Cryotherapy;Iontophoresis;Passive range of motion;Electrical Stimulation;Energy conservation;Manual Therapy;Patient/family education;Functional Mobility Training    Plan continue poc; update HEP    Consulted and Agree with Plan of Care Patient           Patient will benefit from skilled therapeutic intervention in order to improve the following deficits and impairments:   Body Structure / Function / Physical Skills: ADL,Decreased knowledge of use of DME,Strength,GMC,Pain,Edema,Body mechanics,UE functional use,IADL,ROM,Coordination,FMC,Balance   Psychosocial Skills: Environmental  Adaptations   Visit Diagnosis: Pain in right wrist  Other symptoms and signs involving the musculoskeletal system  Muscle weakness (generalized)  Localized edema  History of falling    Problem List Patient Active Problem List   Diagnosis Date Noted  . Fracture, Colles, right, closed 11/16/2020  . Pruritus of scalp 06/20/2018  . DDD (degenerative disc disease), lumbar 05/27/2018  . Essential hypertension 03/19/2017  . History of pityriasis rosea 03/19/2017  . Chronic right SI joint pain 03/19/2017   . History of malignant neoplasm of breast 03/19/2017  . Eczema 03/05/2017  . Vulvar dermatitis 03/05/2017  . Rheumatoid arthritis  10/15/2016  . High risk medication use 10/15/2016  . 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 S/P lumpectomy of breast 12/03/2011  . 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[place]) 11/01/2011     Kathrine Cords, OTR/L, MSOT 12/22/2020, 5:08 PM  [address] [place], [place], [address] Phone: [phone]   Fax:  [phone]  Name: LIESEL PECKENPAUGH MRN: [id number] Date of Birth: [birth year]

## 2020-12-22 NOTE — Patient Instructions (Signed)
Fall Prevention and Home Safety  Adjust furniture placement if finding it is becoming a hazard  Be strategies about throw rugs and try shelf liner or another stabilizing surface to secure throw rugs in place  Keep unnecessary objects off the floor  Be cognizant and adjust lamp placement/light switch connects, if needed  Use nightlights  Stabilize step stools, double check before stepping up, and try to position them near sturdy furniture or walls  Keep frequently used objects closer to waist level, if possible  Continue to go out on walks  Continue to follow up with eye doctor to ensure vision prescription is up-to-date

## 2020-12-23 NOTE — Telephone Encounter (Signed)
I LMOM for patient to call for benefit information, and to schedule right knee [place].

## 2020-12-25 ENCOUNTER — Other Ambulatory Visit: Payer: Self-pay | Admitting: Physician Assistant

## 2020-12-26 NOTE — Telephone Encounter (Signed)
Last Visit: 12/15/2020 Next Visit: 03/30/2021 Labs: 12/15/2020, CMP WNL. MCV and MCH remain borderline elevated. Please make sure the patient is taking folic acid 2 mg daily. We will continue to monitor.   Current Dose per office note 12/15/2020, Methotrexate 7 tablets by mouth once weekly DX: Rheumatoid arthritis  Okay to refill MTX?

## 2020-12-27 ENCOUNTER — Encounter: Payer: Self-pay | Admitting: Occupational Therapy

## 2020-12-27 ENCOUNTER — Ambulatory Visit: Payer: Medicare PPO | Admitting: Occupational Therapy

## 2020-12-27 ENCOUNTER — Other Ambulatory Visit: Payer: Self-pay

## 2020-12-27 DIAGNOSIS — R29898 Other symptoms and signs involving the musculoskeletal system: Secondary | ICD-10-CM | POA: Diagnosis not present

## 2020-12-27 DIAGNOSIS — R600 Localized edema: Secondary | ICD-10-CM

## 2020-12-27 DIAGNOSIS — Z9181 History of falling: Secondary | ICD-10-CM | POA: Diagnosis not present

## 2020-12-27 DIAGNOSIS — M6281 Muscle weakness (generalized): Secondary | ICD-10-CM | POA: Diagnosis not present

## 2020-12-27 DIAGNOSIS — M25531 Pain in right wrist: Secondary | ICD-10-CM | POA: Diagnosis not present

## 2020-12-27 NOTE — Progress Notes (Signed)
   Procedure Note  Patient: Melissa Perez             Date of Birth: Apr 26, 1946           MRN: [id number]             Visit Date: 01/02/2021  Procedures: Visit Diagnoses:  1. Primary osteoarthritis of right knee    Orthovisc #1 Right knee joint injection  Large Joint Inj: R knee on 01/02/2021 8:07 AM Indications: pain Details: 27 G 1.5 in needle, medial approach  Arthrogram: No  Medications: 1.5 mL lidocaine 1 %; 30 mg Hyaluronan 30 MG/2ML Aspirate: 0 mL Outcome: tolerated well, no immediate complications Procedure, treatment alternatives, risks and benefits explained, specific risks discussed. Consent was given by the patient. Immediately prior to procedure a time out was called to verify the correct patient, procedure, equipment, support staff and site/side marked as required. Patient was prepped and draped in the usual sterile fashion.      Patient tolerated the procedure well.  Aftercare was discussed.  Hazel Sams, PA-C

## 2020-12-27 NOTE — Patient Instructions (Signed)
Full Grip:     Squeeze putty using thumb and all fingers.  Repeat 15 times. Do 1-2 sessions per day.   Pulling:  Pull putty with both hands starting together and pulling apart in opposite directions.  Do this 10 times. 1-2 times a day   Finger and Thumb Extension:     With thumb and all fingers in center of putty donut, stretch out.  Focus on using all fingers and thumb equally.  Repeat 5-10 times. Do 1-2 sessions per day.   Lateral Pinch Strengthening:      Squeeze between thumb and side of index finger around middle knuckle.  Repeat 10 times. Do 1-2 sessions per day.

## 2020-12-28 ENCOUNTER — Ambulatory Visit: Payer: Medicare PPO | Admitting: Orthopaedic Surgery

## 2020-12-28 ENCOUNTER — Ambulatory Visit (INDEPENDENT_AMBULATORY_CARE_PROVIDER_SITE_OTHER): Payer: Medicare PPO

## 2020-12-28 DIAGNOSIS — S52531A Colles' fracture of right radius, initial encounter for closed fracture: Secondary | ICD-10-CM

## 2020-12-28 NOTE — Progress Notes (Signed)
Office Visit Note   Patient: Melissa Perez           Date of Birth: 05-Jul-1946           MRN: [id number] Visit Date: 12/28/2020              Requested by: Lucianne Lei, MD [address] [place],  [place] [address] PCP: Lucianne Lei, MD   Assessment & Plan: Visit Diagnoses:  1. Closed Colles' fracture of right radius, initial encounter     Plan: Impression is approximately 12 weeks out right distal radius fracture.  The fracture has nearly healed and she is significantly doing better.  At this point, she would like to continue hand therapy for a little while longer and I provided a prescription for this.  She will follow up with [place] as needed.  Follow-Up Instructions: Return if symptoms worsen or fail to improve.   Orders:  Orders Placed This Encounter  Procedures  . XR Wrist Complete Right   No orders of the defined types were placed in this encounter.     Procedures: No procedures performed   Clinical Data: No additional findings.   Subjective: Chief Complaint  Patient presents with  . Right Wrist - Pain    HPI very pleasant 75 year old female who comes in today approximately 12 weeks out right distal radius fracture.  She has been doing well.  She has been in physical therapy over the past for 5 weeks making great progress.  She still endorses some ulnar-sided pain with motion of the wrist but overall has improved significantly.     Objective: Vital Signs: There were no vitals taken for this visit.    Ortho Exam right wrist exam shows no deformity.  No swelling.  She has minimal tenderness to the distal radius.  She still has some tenderness to the ulnar side.  She is neurovascularly intact distally.  Specialty Comments:  No specialty comments available.  Imaging: XR Wrist Complete Right  Result Date: 12/28/2020 Considerable consolidation to the fracture site    PMFS History: Patient Active Problem List   Diagnosis Date Noted  . Fracture,  Colles, right, closed 11/16/2020  . Pruritus of scalp 06/20/2018  . DDD (degenerative disc disease), lumbar 05/27/2018  . Essential hypertension 03/19/2017  . History of pityriasis rosea 03/19/2017  . 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 History of malignant neoplasm of breast 03/19/2017  . Eczema 03/05/2017  . Vulvar dermatitis 03/05/2017  . Rheumatoid arthritis  10/15/2016  . High risk medication use 10/15/2016  . 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 S/P lumpectomy of breast 12/03/2011  . 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[place]) 11/01/2011   Past Medical History:  Diagnosis Date  . Arthritis    rheumatoid, take Humira  . Arthritis    rheumatoid  . Arthritis pain   . Back pain   . Breast cancer ([place])   . Cancer ([place])   . History of dental surgery   . Hyperlipidemia   . Hypertension   . Personal history of radiation therapy   . Rash   . Rheumatoid arthritis ([place])   . S/P lumpectomy of breast 12/03/2011    Family History  Problem Relation Age of Onset  . Breast cancer Mother   . Cancer Mother   . Kidney cancer Father   . Cancer Father   . Breast cancer Sister   . Cancer  Sister   . Lung cancer Sister   . Colon cancer Maternal Uncle   . Liver cancer Paternal Aunt     Past Surgical History:  Procedure Laterality Date  . ABDOMINAL HYSTERECTOMY    . APPENDECTOMY    . BREAST EXCISIONAL BIOPSY Left 1991   benign  . BREAST LUMPECTOMY Right 11/2011  . BREAST SURGERY     2 left breast biopsies-benign  . left breast biopsies  [place]   Social History   Occupational History  . Occupation: Technical brewer: [employer]  Tobacco Use  . Smoking status: Former Smoker    Packs/day: 0.10    Types: Cigarettes    Quit date: 11/21/2003    Years since quitting: 17.1  . Smokeless tobacco: Never Used  Vaping Use  . Vaping Use: Never used  Substance and Sexual Activity  . Alcohol use:  Yes    Alcohol/week: 3.0 standard drinks    Types: 3 Glasses of wine per week  . Drug use: No  . Sexual activity: Not Currently

## 2020-12-28 NOTE — Therapy (Signed)
[address] [place], [place], [address] Phone: [phone]   Fax:  [phone]  Occupational Therapy Treatment  Patient Details  Name: Melissa Perez MRN: [id number] Date of Birth: 30-May-1946 Referring Provider (OT): Leandrew Koyanagi, MD   Encounter Date: 12/27/2020   OT End of Session - 12/27/20 1647    Visit Number 8    Number of Visits 17    Date for OT Re-Evaluation 01/19/21    Authorization Type [employer]    Progress Note Due on Visit 10    OT Start Time 0932    OT Stop Time 1028    OT Time Calculation (min) 56 min    Activity Tolerance Patient tolerated treatment well    Behavior During Therapy [facility] for tasks assessed/performed           Past Medical History:  Diagnosis Date  . Arthritis    rheumatoid, take Humira  . Arthritis    rheumatoid  . Arthritis pain   . Back pain   . Breast cancer ([place])   . Cancer ([place])   . History of dental surgery   . Hyperlipidemia   . Hypertension   . Personal history of radiation therapy   . Rash   . Rheumatoid arthritis ([place])   . S/P lumpectomy of breast 12/03/2011    Past Surgical History:  Procedure Laterality Date  . ABDOMINAL HYSTERECTOMY    . APPENDECTOMY    . BREAST EXCISIONAL BIOPSY Left 1991   benign  . BREAST LUMPECTOMY Right 11/2011  . BREAST SURGERY     2 left breast biopsies-benign  . left breast biopsies  1991 & 1990    There were no vitals filed for this visit.   Subjective Assessment - 12/27/20 0939    Subjective  "This has been a very beneficial experience"    Pertinent History rheumatoid arthritis    Special Tests AROM    Patient Stated Goals "Minimize damage to soft tissue," improve grasp strength, and manage pain    Currently in Pain? No/denies            OT Treatments/Exercises (OP) - 12/28/20 1200      ADLs   Increased Safety Strategies OT reviewed previously discussed fall prevention and pain management strategies   Pt  reports carryover of techniques at home     Wrist Exercises   Wrist Extension Strengthening;Both;10 reps   Twisting/wringing resistive putty in both hands; completed 2 sets of 10 reps   Other wrist exercises AAROM with Rolyan Mr. [name] bar and no additional weight; rolling forward/down and then back/up 2x each.   Tactile cue to maintain elbow in flexion at side to isolate the R wrist; OT support contralateral side of bar to prevent pt compensation with L wrist     Hand Exercises   MCPJ Extension Right;Strengthening;10 reps   Rolling resistive putty into a circle/donut and then extending thumb and fingers against putty; completed 2 sets of 10 reps. Exercise included in HEP.     Neurological Re-education Exercises   Theraputty - Pinch Lateral key pinch with resistive putty completed 2 sets of 10 with R hand; demonstration needed for initial positioning of putty   exercise included in updated HEP     Moist Heat Therapy   Number Minutes Moist Heat 10 Minutes    Moist Heat Location Wrist;Hand            OT Short Term Goals - 12/27/20  [place] #1   Title Pt will improve participation in grooming activities as evidenced by increasing AROM of wrist extension/supination by at least 10 degrees.    Baseline 55 degrees of R wrist extension; 60 degrees of supination    Time 4    Period Weeks    Status Revised    Target Date 12/22/20      OT SHORT TERM GOAL #2   Title Pt will identify and demonstrate understanding of at least 3 pain management strategies to utilize at home.    Baseline Does not currently have pain mgmt strategies.    Time 4    Period Weeks    Status On-going      OT SHORT TERM GOAL #3   Title Pt will report implementation of at least 2 safety strategies for improving balance and safety at home to aid with fall prevention.   Introduced fall prevention strategies   Baseline Injury sustained due to a fall; pt reports multiple falls with the past 5 months.     Time 4    Period Weeks    Status Achieved   Pt reports going to the store to purchase nightlights for her home and           OT Long Term Goals - 11/24/20 1101      OT LONG TERM GOAL #1   Title Pt will improve participation in BADLs as evidenced by increasing AROM of wrist extension to [employer] and forearm supination by at least 10 degrees with minimal pain.    Baseline Wrist extension at 55 degrees; Forearm sup at 60 (80-90 is WNL)    Time 8    Period Weeks    Status On-going      OT LONG TERM GOAL #2   Title Pt will demonstrate independence with HEP and self-report compliance at least 75% of the time at home.   Initiated HEP   Baseline No HEP.    Time 8    Period Weeks    Status On-going      OT LONG TERM GOAL #3   Title Pt will demonstrate understanding of compensatory strategies, including use of assistive devices, 100% of the time to improve participation in light housekeeping and meal prep activities.    Baseline No compensatory strategies.    Time 8    Period Weeks    Status On-going      OT LONG TERM GOAL #4   Title Pt will open small-medium containers in 4/4 trials and report pain less than 5/10 to improve functional grip during IADLs    Baseline Pt reports difficulty opening containers 100% of the time    Time 8    Period Weeks    Status On-going            Plan - 12/28/20 1139    Clinical Impression Statement Pt has follow-up appt with physician tomorrow (12/28/20); will review/update poc as needed per physician recommendations. Pt is progressing well and reports decreased pain with functional tasks. Due to current progress, OT updated HEP to focus on PROM stretches of wrist/hand and strengthening with resitive putty.    OT Occupational Profile and History Problem Focused Assessment - Including review of records relating to presenting problem    Occupational performance deficits (Please refer to evaluation for details): ADL's;IADL's;Leisure;Other    Body  Structure / Function / Physical Skills ADL;Decreased knowledge of use of DME;Strength;GMC;Pain;Edema;Body mechanics;UE functional use;IADL;ROM;Coordination;FMC;Balance  Psychosocial Skills Environmental  Adaptations    Rehab Potential Good    Clinical Decision Making Several treatment options, min-mod task modification necessary    Comorbidities Affecting Occupational Performance: May have comorbidities impacting occupational performance    Modification or Assistance to Complete Evaluation  Min-Moderate modification of tasks or assist with assess necessary to complete eval    OT Frequency 2x / week    OT Duration 8 weeks    OT Treatment/Interventions Self-care/ADL training;Moist Heat;Fluidtherapy;DME and/or AE instruction;Manual lymph drainage;Splinting;Contrast Bath;Compression bandaging;Therapeutic activities;Ultrasound;Therapeutic exercise;Cryotherapy;Iontophoresis;Passive range of motion;Electrical Stimulation;Energy conservation;Manual Therapy;Patient/family education;Functional Mobility Training    Plan Reassess poc per physician findings/recommendations    Consulted and Agree with Plan of Care Patient           Patient will benefit from skilled therapeutic intervention in order to improve the following deficits and impairments:   Body Structure / Function / Physical Skills: ADL,Decreased knowledge of use of DME,Strength,GMC,Pain,Edema,Body mechanics,UE functional use,IADL,ROM,Coordination,FMC,Balance   Psychosocial Skills: Environmental  Adaptations   Visit Diagnosis: Pain in right wrist  Other symptoms and signs involving the musculoskeletal system  Muscle weakness (generalized)  Localized edema  History of falling    Problem List Patient Active Problem List   Diagnosis Date Noted  . Fracture, Colles, right, closed 11/16/2020  . Pruritus of scalp 06/20/2018  . DDD (degenerative disc disease), lumbar 05/27/2018  . Essential hypertension 03/19/2017  . History of  pityriasis rosea 03/19/2017  . 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 History of malignant neoplasm of breast 03/19/2017  . Eczema 03/05/2017  . Vulvar dermatitis 03/05/2017  . Rheumatoid arthritis  10/15/2016  . High risk medication use 10/15/2016  . 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 S/P lumpectomy of breast 12/03/2011  . 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[place]) 11/01/2011     Kathrine Cords, OTR/L, MSOT 12/28/2020, 12:17 PM  [address] [place], [place], [address] Phone: [phone]   Fax:  [phone]  Name: Melissa Perez MRN: [id number] Date of Birth: [birth year]

## 2020-12-29 ENCOUNTER — Ambulatory Visit: Payer: Medicare PPO | Admitting: Occupational Therapy

## 2020-12-29 ENCOUNTER — Other Ambulatory Visit: Payer: Self-pay

## 2020-12-29 ENCOUNTER — Encounter: Payer: Self-pay | Admitting: Occupational Therapy

## 2020-12-29 DIAGNOSIS — R29898 Other symptoms and signs involving the musculoskeletal system: Secondary | ICD-10-CM

## 2020-12-29 DIAGNOSIS — R600 Localized edema: Secondary | ICD-10-CM

## 2020-12-29 DIAGNOSIS — Z9181 History of falling: Secondary | ICD-10-CM

## 2020-12-29 DIAGNOSIS — M6281 Muscle weakness (generalized): Secondary | ICD-10-CM

## 2020-12-29 DIAGNOSIS — M25531 Pain in right wrist: Secondary | ICD-10-CM

## 2020-12-29 NOTE — Therapy (Signed)
[address] [place], [place], [address] Phone: [phone]   Fax:  [phone]  Occupational Therapy Treatment  Patient Details  Name: Melissa Perez MRN: [id number] Date of Birth: 1946/01/29 Referring Provider (OT): Leandrew Koyanagi, MD   Encounter Date: 12/29/2020   OT End of Session - 12/29/20 1417    Visit Number 9    Number of Visits 17    Date for OT Re-Evaluation 01/19/21    Authorization Type [employer]    Progress Note Due on Visit 10    OT Start Time [phone]    OT Stop Time 1016    OT Time Calculation (min) 45 min    Activity Tolerance Patient tolerated treatment well    Behavior During Therapy [facility] for tasks assessed/performed           Past Medical History:  Diagnosis Date  . Arthritis    rheumatoid, take Humira  . Arthritis    rheumatoid  . Arthritis pain   . Back pain   . Breast cancer ([place])   . Cancer ([place])   . History of dental surgery   . Hyperlipidemia   . Hypertension   . Personal history of radiation therapy   . Rash   . Rheumatoid arthritis ([place])   . S/P lumpectomy of breast 12/03/2011    Past Surgical History:  Procedure Laterality Date  . ABDOMINAL HYSTERECTOMY    . APPENDECTOMY    . BREAST EXCISIONAL BIOPSY Left 1991   benign  . BREAST LUMPECTOMY Right 11/2011  . BREAST SURGERY     2 left breast biopsies-benign  . left breast biopsies  1991 & 1990    There were no vitals filed for this visit.   Subjective Assessment - 12/29/20 1400    Subjective  Pt reported that follow-up appt with physician went well yesterday    Pertinent History rheumatoid arthritis    Special Tests AROM    Patient Stated Goals "Minimize damage to soft tissue," improve grasp strength, and manage pain    Currently in Pain? No/denies            [facility] OT Assessment - 12/29/20 1439      AROM   AROM Assessment Site Wrist;Forearm    Right Forearm Pronation 80 Degrees    Right Forearm  Supination 64 Degrees    Left Forearm Pronation 79 Degrees    Left Forearm Supination 80 Degrees    Right Wrist Extension 55 Degrees    Right Wrist Flexion 60 Degrees    Right Wrist Radial Deviation 15 Degrees    Right Wrist Ulnar Deviation 35 Degrees    Left Wrist Extension 70 Degrees    Left Wrist Flexion 79 Degrees    Left Wrist Radial Deviation 28 Degrees    Left Wrist Ulnar Deviation 47 Degrees            OT Treatments/Exercises (OP) - 12/29/20 0950      ADLs   Increased Safety Strategies OT discussed/reviewed pain management strategies, including heat/ice, compression sleeve/supportive brace, and edema management    Compensatory Strategies OT reviewed compensatory strategies and assistive equipment that could be used to assist with opening jars/cans. Handouts provided for purchase options and discussed appropriate alternates.      Wrist Exercises   Other wrist exercises AROM completed in all planes, including forearm supination/pronation with measurements taken at end-range of each to assess progress toward goals   R wrist extension remained  the same; wrist unlar/radial deviation and supination increased, wrist extension and pronation     Moist Heat Therapy   Number Minutes Moist Heat 10 Minutes    Moist Heat Location Wrist;Hand   Education provided during moist heat therapy           OT Education - 12/29/20 1400    Education Details Education provided on current progress toward all goals and discussed plan/continuation of goals for therapy moving forward. OT also discussed functional impact of injury and encouraged pt to call physician with any more specific questions.    Person(s) Educated Patient    Methods Explanation    Comprehension Verbalized understanding            OT Short Term Goals - 12/29/20 0943      OT SHORT TERM GOAL #1   Title Pt will improve participation in grooming activities as evidenced by increasing AROM of wrist extension/supination by at  least 10 degrees.    Baseline 55 degrees of R wrist extension; 60 degrees of supination    Time 4    Period Weeks    Status Revised    Target Date 12/22/20      OT SHORT TERM GOAL #2   Title Pt will identify and demonstrate understanding of at least 3 pain management strategies to utilize at home.    Baseline Does not currently have pain mgmt strategies.    Time 4    Period Weeks    Status Achieved   Pt reports using heat/ice as needed, medication, and compression/supportive bracing     OT SHORT TERM GOAL #3   Title Pt will report implementation of at least 2 safety strategies for improving balance and safety at home to aid with fall prevention.   Introduced fall prevention strategies   Baseline Injury sustained due to a fall; pt reports multiple falls with the past 5 months.    Time 4    Period Weeks    Status Achieved   Pt reports going to the store to purchase nightlights for her home and            OT Long Term Goals - 11/24/20 1101      OT LONG TERM GOAL #1   Title Pt will improve participation in BADLs as evidenced by increasing AROM of wrist extension to [facility] and forearm supination by at least 10 degrees with minimal pain.    Baseline Wrist extension at 55 degrees; Forearm sup at 60 (80-90 is WNL)    Time 8    Period Weeks    Status On-going      OT LONG TERM GOAL #2   Title Pt will demonstrate independence with HEP and self-report compliance at least 75% of the time at home.   Initiated HEP   Baseline No HEP.    Time 8    Period Weeks    Status On-going      OT LONG TERM GOAL #3   Title Pt will demonstrate understanding of compensatory strategies, including use of assistive devices, 100% of the time to improve participation in light housekeeping and meal prep activities.    Baseline No compensatory strategies.    Time 8    Period Weeks    Status On-going      OT LONG TERM GOAL #4   Title Pt will open small-medium containers in 4/4 trials and report pain less  than 5/10 to improve functional grip during IADLs    Baseline Pt  reports difficulty opening containers 100% of the time    Time 8    Period Weeks    Status On-going            Plan - 12/29/20 1419    Clinical Impression Statement Pt had follow-up with ortho physician yesterday (12/28/20); from office visit note: "the fracture has nearly healed and she is significantly doing better. Considerable consolidation to the fracture site." AROM within pain-free range continues to be limiting, but pt is making good progress toward goals. Due to current status and pt carryover of exercises to home, OT discussed reducing therapy frequency to 1x/week and pt was receptive.    OT Occupational Profile and History Problem Focused Assessment - Including review of records relating to presenting problem    Occupational performance deficits (Please refer to evaluation for details): ADL's;IADL's;Leisure;Other    Body Structure / Function / Physical Skills ADL;Decreased knowledge of use of DME;Strength;GMC;Pain;Edema;Body mechanics;UE functional use;IADL;ROM;Coordination;FMC;Balance    Psychosocial Skills Environmental  Adaptations    Rehab Potential Good    Clinical Decision Making Several treatment options, min-mod task modification necessary    Comorbidities Affecting Occupational Performance: May have comorbidities impacting occupational performance    Modification or Assistance to Complete Evaluation  Min-Moderate modification of tasks or assist with assess necessary to complete eval    OT Frequency 2x / week    OT Duration 8 weeks    OT Treatment/Interventions Self-care/ADL training;Moist Heat;Fluidtherapy;DME and/or AE instruction;Manual lymph drainage;Splinting;Contrast Bath;Compression bandaging;Therapeutic activities;Ultrasound;Therapeutic exercise;Cryotherapy;Iontophoresis;Passive range of motion;Electrical Stimulation;Energy conservation;Manual Therapy;Patient/family education;Functional Mobility Training     Plan Increase PROM/stretching of R wrist and continue with development of appropriate pain management strategies    Consulted and Agree with Plan of Care Patient           Patient will benefit from skilled therapeutic intervention in order to improve the following deficits and impairments:   Body Structure / Function / Physical Skills: ADL,Decreased knowledge of use of DME,Strength,GMC,Pain,Edema,Body mechanics,UE functional use,IADL,ROM,Coordination,FMC,Balance   Psychosocial Skills: Environmental  Adaptations   Visit Diagnosis: Pain in right wrist  Other symptoms and signs involving the musculoskeletal system  Muscle weakness (generalized)  Localized edema  History of falling    Problem List Patient Active Problem List   Diagnosis Date Noted  . Fracture, Colles, right, closed 11/16/2020  . Pruritus of scalp 06/20/2018  . DDD (degenerative disc disease), lumbar 05/27/2018  . Essential hypertension 03/19/2017  . History of pityriasis rosea 03/19/2017  . 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 History of malignant neoplasm of breast 03/19/2017  . Eczema 03/05/2017  . Vulvar dermatitis 03/05/2017  . Rheumatoid arthritis  10/15/2016  . High risk medication use 10/15/2016  . 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 S/P lumpectomy of breast 12/03/2011  . 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[place]) 11/01/2011     Kathrine Cords, OTR/L, MSOT 12/29/2020, 5:16 PM  [place] [place], [place], [address] Phone: [phone]   Fax:  [phone]  Name: Melissa Perez MRN: [id number] Date of Birth: [birth year]

## 2020-12-30 ENCOUNTER — Ambulatory Visit: Payer: Medicare PPO | Admitting: Physician Assistant

## 2021-01-02 ENCOUNTER — Other Ambulatory Visit: Payer: Self-pay

## 2021-01-02 ENCOUNTER — Ambulatory Visit: Payer: Medicare PPO | Admitting: Physician Assistant

## 2021-01-02 DIAGNOSIS — G8929 Other chronic pain: Secondary | ICD-10-CM

## 2021-01-02 DIAGNOSIS — R7611 Nonspecific reaction to tuberculin skin test without active tuberculosis: Secondary | ICD-10-CM

## 2021-01-02 DIAGNOSIS — M0579 Rheumatoid arthritis with rheumatoid factor of multiple sites without organ or systems involvement: Secondary | ICD-10-CM

## 2021-01-02 DIAGNOSIS — G5760 Lesion of plantar nerve, unspecified lower limb: Secondary | ICD-10-CM

## 2021-01-02 DIAGNOSIS — Z872 Personal history of diseases of the skin and subcutaneous tissue: Secondary | ICD-10-CM

## 2021-01-02 DIAGNOSIS — M1711 Unilateral primary osteoarthritis, right knee: Secondary | ICD-10-CM

## 2021-01-02 DIAGNOSIS — Z79899 Other long term (current) drug therapy: Secondary | ICD-10-CM

## 2021-01-02 DIAGNOSIS — I10 Essential (primary) hypertension: Secondary | ICD-10-CM | POA: Diagnosis not present

## 2021-01-02 DIAGNOSIS — Z853 Personal history of malignant neoplasm of breast: Secondary | ICD-10-CM

## 2021-01-02 DIAGNOSIS — M009 Pyogenic arthritis, unspecified: Secondary | ICD-10-CM | POA: Diagnosis not present

## 2021-01-02 DIAGNOSIS — M810 Age-related osteoporosis without current pathological fracture: Secondary | ICD-10-CM

## 2021-01-02 DIAGNOSIS — E785 Hyperlipidemia, unspecified: Secondary | ICD-10-CM | POA: Diagnosis not present

## 2021-01-02 DIAGNOSIS — M1991 Primary osteoarthritis, unspecified site: Secondary | ICD-10-CM | POA: Diagnosis not present

## 2021-01-02 DIAGNOSIS — R7309 Other abnormal glucose: Secondary | ICD-10-CM | POA: Diagnosis not present

## 2021-01-02 DIAGNOSIS — R7301 Impaired fasting glucose: Secondary | ICD-10-CM | POA: Diagnosis not present

## 2021-01-02 DIAGNOSIS — S6291XA Unspecified fracture of right wrist and hand, initial encounter for closed fracture: Secondary | ICD-10-CM | POA: Diagnosis not present

## 2021-01-02 DIAGNOSIS — Z8679 Personal history of other diseases of the circulatory system: Secondary | ICD-10-CM

## 2021-01-02 DIAGNOSIS — M5136 Other intervertebral disc degeneration, lumbar region: Secondary | ICD-10-CM

## 2021-01-02 MED ORDER — HYALURONAN 30 MG/2ML IX SOSY
30.0000 mg | PREFILLED_SYRINGE | INTRA_ARTICULAR | Status: AC | PRN
  Administered 2021-01-02: 30 mg via INTRA_ARTICULAR

## 2021-01-02 MED ORDER — LIDOCAINE HCL 1 % IJ SOLN
1.5000 mL | INTRAMUSCULAR | Status: AC | PRN
  Administered 2021-01-02: 1.5 mL

## 2021-01-03 ENCOUNTER — Encounter: Payer: Self-pay | Admitting: Occupational Therapy

## 2021-01-03 ENCOUNTER — Other Ambulatory Visit: Payer: Self-pay

## 2021-01-03 ENCOUNTER — Ambulatory Visit: Payer: Medicare PPO | Attending: Orthopaedic Surgery | Admitting: Occupational Therapy

## 2021-01-03 DIAGNOSIS — R600 Localized edema: Secondary | ICD-10-CM | POA: Insufficient documentation

## 2021-01-03 DIAGNOSIS — R29898 Other symptoms and signs involving the musculoskeletal system: Secondary | ICD-10-CM | POA: Diagnosis not present

## 2021-01-03 DIAGNOSIS — M25531 Pain in right wrist: Secondary | ICD-10-CM | POA: Diagnosis not present

## 2021-01-03 DIAGNOSIS — M6281 Muscle weakness (generalized): Secondary | ICD-10-CM | POA: Insufficient documentation

## 2021-01-03 DIAGNOSIS — Z9181 History of falling: Secondary | ICD-10-CM | POA: Insufficient documentation

## 2021-01-04 NOTE — Therapy (Addendum)
[address]. [place], [place], [address] Phone: [phone]   Fax:  [phone]  Occupational Therapy Treatment & D/C Summary  Patient Details  Name: Melissa Perez MRN: [id number] Date of Birth: 09-19-1946 Referring Provider (OT): Leandrew Koyanagi, MD   Encounter Date: 01/03/2021   OT End of Session - 01/04/21 1022    Visit Number 10    Number of Visits 17    Date for OT Re-Evaluation 01/19/21    Authorization Type [employer]    Progress Note Due on Visit 10    OT Start Time 0930    OT Stop Time 1004    OT Time Calculation (min) 34 min    Activity Tolerance Patient tolerated treatment well    Behavior During Therapy [facility] for tasks assessed/performed           Past Medical History:  Diagnosis Date  . Arthritis    rheumatoid, take Humira  . Arthritis    rheumatoid  . Arthritis pain   . Back pain   . Breast cancer ([place])   . Cancer ([place])   . History of dental surgery   . Hyperlipidemia   . Hypertension   . Personal history of radiation therapy   . Rash   . Rheumatoid arthritis ([place])   . S/P lumpectomy of breast 12/03/2011    Past Surgical History:  Procedure Laterality Date  . ABDOMINAL HYSTERECTOMY    . APPENDECTOMY    . BREAST EXCISIONAL BIOPSY Left 1991   benign  . BREAST LUMPECTOMY Right 11/2011  . BREAST SURGERY     2 left breast biopsies-benign  . left breast biopsies  1991 & 1990    There were no vitals filed for this visit.   Subjective Assessment - 01/03/21 0935    Subjective  Pt reported she had a follow-up with her PCP and that things have been going well. "The ice has really been helping with the soreness."    Pertinent History rheumatoid arthritis    Special Tests AROM    Patient Stated Goals "Minimize damage to soft tissue," improve grasp strength, and manage pain    Currently in Pain? No/denies            OT Treatments/Exercises (OP) - 01/04/21 1046      ADLs   ADL  Comments Pt practiced opening 10 differently sized containers; required increased time with 2/10, but otherwise was able to open all containers independently and w/out pain    Increased Safety Strategies OT reviewed all fall prevention, pain management, and compensatory strategies            OT Education - 01/04/21 1043    Education Details Session focused on education and review of compensatory, fall prevention, and pain management strategies, as well as progress toward goals.    Person(s) Educated Patient    Methods Explanation    Comprehension Verbalized understanding            OT Short Term Goals - 01/04/21 1019      OT SHORT TERM GOAL #1   Title Pt will improve participation in grooming activities as evidenced by increasing AROM of wrist extension/supination by at least 10 degrees.    Baseline 55 degrees of R wrist extension; 60 degrees of supination    Time 4    Period Weeks    Status Partially Met   Pt reports wrist AROM is [facility]   Target Date 12/22/20  OT SHORT TERM GOAL #2   Title Pt will identify and demonstrate understanding of at least 3 pain management strategies to utilize at home.    Baseline Does not currently have pain mgmt strategies.    Time 4    Period Weeks    Status Achieved   Pt reports using heat/ice as needed, medication, and compression/supportive bracing     OT SHORT TERM GOAL #3   Title Pt will report implementation of at least 2 safety strategies for improving balance and safety at home to aid with fall prevention.   Introduced fall prevention strategies   Baseline Injury sustained due to a fall; pt reports multiple falls with the past 5 months.    Time 4    Period Weeks    Status Achieved   Pt reports going to the store to purchase nightlights for her home and           OT Long Term Goals - 01/04/21 1020      OT LONG TERM GOAL #1   Title Pt will improve participation in BADLs as evidenced by increasing AROM of wrist extension to [facility] and  forearm supination by at least 10 degrees with minimal pain.    Baseline Wrist extension at 55 degrees; Forearm sup at 60 (80-90 is WNL)    Time 8    Period Weeks    Status Partially Met   Pt able to move through wrist/forearm ROM within pain-free range     OT LONG TERM GOAL #2   Title Pt will demonstrate independence with HEP and self-report compliance at least 75% of the time at home.   Initiated HEP   Baseline No HEP.    Time 8    Period Weeks    Status Achieved      OT LONG TERM GOAL #3   Title Pt will demonstrate understanding of compensatory strategies, including use of assistive devices, 100% of the time to improve participation in light housekeeping and meal prep activities.    Baseline No compensatory strategies.    Time 8    Period Weeks    Status Achieved      OT LONG TERM GOAL #4   Title Pt will open small-medium containers in 4/4 trials and report pain less than 5/10 to improve functional grip during IADLs    Baseline Pt reports difficulty opening containers 100% of the time    Time 8    Period Weeks    Status Achieved            Plan - 01/04/21 1018    Clinical Impression Statement Pt is a 75 y.o. female seen for OP OT secondary to a Colles' fx of R wrist. Tx has incorporated AROM, strengthing, compensatory and safety strategies, as well as appropriate modalities and other pain management techniques. Due to progress at this time and current functional status, appropriate carryover of strategies/techniques to home, and having met goals, skilled OT services are no longer indicated at this time. Pt requested to defer for ~2 weeks to determine continued necessity of continued therapy. Pt encouraged to call OT if any relevant functional deficits develop/occur. Pt to be d/c'd if no further therapy is warranted by 01/24/21.    OT Occupational Profile and History Problem Focused Assessment - Including review of records relating to presenting problem    Occupational performance  deficits (Please refer to evaluation for details): ADL's;IADL's;Leisure;Other    Body Structure / Function / Physical Skills ADL;Decreased knowledge of  use of DME;Strength;GMC;Pain;Edema;Body mechanics;UE functional use;IADL;ROM;Coordination;FMC;Balance    Psychosocial Skills Environmental  Adaptations    Rehab Potential Good    Clinical Decision Making Several treatment options, min-mod task modification necessary    Comorbidities Affecting Occupational Performance: May have comorbidities impacting occupational performance    Modification or Assistance to Complete Evaluation  Min-Moderate modification of tasks or assist with assess necessary to complete eval    OT Frequency 2x / week    OT Duration 8 weeks    OT Treatment/Interventions Self-care/ADL training;Moist Heat;Fluidtherapy;DME and/or AE instruction;Manual lymph drainage;Splinting;Contrast Bath;Compression bandaging;Therapeutic activities;Ultrasound;Therapeutic exercise;Cryotherapy;Iontophoresis;Passive range of motion;Electrical Stimulation;Energy conservation;Manual Therapy;Patient/family education;Functional Mobility Training    Plan Put pt on hold for 2 weeks; Will d/c at that time if no further therapy is needed    Consulted and Agree with Plan of Care Patient            Patient will benefit from skilled therapeutic intervention in order to improve the following deficits and impairments:   Body Structure / Function / Physical Skills: ADL,Decreased knowledge of use of DME,Strength,GMC,Pain,Edema,Body mechanics,UE functional use,IADL,ROM,Coordination,FMC,Balance   Psychosocial Skills: Environmental  Adaptations    Occupational Therapy Progress Note & Discharge Summary  This progress note covers dates of service from 11/17/20 - 01/03/21  Objective Reports of Subjective Statement: Pt continues to report discomfort is steadily decreasing.  Objective Measurements / Functional Outcomes related to goals: Pt has met goals within  pt-level of satisfaction. AROM of wrist extension and forearm supination continues to measure below ideal range, but pt reports no functional deficits or discomfort with motion, which has consistently improved over course of therapy.  Goal Update: Pt has met 2/3 STG and 3/4 LTG; remaining STG and LTG have both been partially met to within pt level of satisfaction.   Remaining deficits: Decreased wrist AROM and strength; mild discomfort   Education / Equipment: Fall prevention, compensatory strategies, HEP, pain mgmt strategies   Plan: Patient agrees to discharge.  Patient goals were partially met. Patient is being discharged due to being pleased with the current functional level.  Pt was put on hold after last session and did not request return to therapy within 60 days; see 'Clinical Impression Statement' above for detail???      Visit Diagnosis: Pain in right wrist  Other symptoms and signs involving the musculoskeletal system  Muscle weakness (generalized)  Localized edema  History of falling    Problem List Patient Active Problem List   Diagnosis Date Noted  . Fracture, Colles, right, closed 11/16/2020  . Pruritus of scalp 06/20/2018  . DDD (degenerative disc disease), lumbar 05/27/2018  . Essential hypertension 03/19/2017  . History of pityriasis rosea 03/19/2017  . 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 History of malignant neoplasm of breast 03/19/2017  . Eczema 03/05/2017  . Vulvar dermatitis 03/05/2017  . Rheumatoid arthritis  10/15/2016  . High risk medication use 10/15/2016  . 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 S/P lumpectomy of breast 12/03/2011  . 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[place]) 11/01/2011     Kathrine Cords, OTR/L, MSOT 01/04/2021, 11:14 AM  [place]. [place], [place], [address] Phone: [phone]   Fax:   [phone]  Name: Melissa Perez MRN: [id number] Date of Birth: 08/17/1946

## 2021-01-09 ENCOUNTER — Ambulatory Visit (INDEPENDENT_AMBULATORY_CARE_PROVIDER_SITE_OTHER): Payer: Medicare PPO | Admitting: Physician Assistant

## 2021-01-09 ENCOUNTER — Other Ambulatory Visit: Payer: Self-pay

## 2021-01-09 DIAGNOSIS — M1711 Unilateral primary osteoarthritis, right knee: Secondary | ICD-10-CM

## 2021-01-09 MED ORDER — HYALURONAN 30 MG/2ML IX SOSY
30.0000 mg | PREFILLED_SYRINGE | INTRA_ARTICULAR | Status: AC | PRN
  Administered 2021-01-09: 30 mg via INTRA_ARTICULAR

## 2021-01-09 MED ORDER — LIDOCAINE HCL 1 % IJ SOLN
1.5000 mL | INTRAMUSCULAR | Status: AC | PRN
  Administered 2021-01-09: 1.5 mL

## 2021-01-09 NOTE — Progress Notes (Signed)
   Procedure Note  Patient: Melissa Perez             Date of Birth: 20-Jun-1946           MRN: [id number]             Visit Date: 01/09/2021  Procedures: Visit Diagnoses:  1. Primary osteoarthritis of right knee     Orthovisc #2 right knee, B/B Large Joint Inj: R knee on 01/09/2021 8:57 AM Indications: pain Details: 25 G 1.5 in needle, medial approach  Arthrogram: No  Medications: 1.5 mL lidocaine 1 %; 30 mg Hyaluronan 30 MG/2ML Aspirate: 0 mL Outcome: tolerated well, no immediate complications Procedure, treatment alternatives, risks and benefits explained, specific risks discussed. Consent was given by the patient. Immediately prior to procedure a time out was called to verify the correct patient, procedure, equipment, support staff and site/side marked as required. Patient was prepped and draped in the usual sterile fashion.     Patient tolerated the procedure well.  Aftercare was discussed.  Hazel Sams, PA-C

## 2021-01-10 ENCOUNTER — Ambulatory Visit: Payer: Medicare PPO | Admitting: Occupational Therapy

## 2021-01-16 ENCOUNTER — Ambulatory Visit (INDEPENDENT_AMBULATORY_CARE_PROVIDER_SITE_OTHER): Payer: Medicare PPO | Admitting: Physician Assistant

## 2021-01-16 ENCOUNTER — Other Ambulatory Visit: Payer: Self-pay

## 2021-01-16 DIAGNOSIS — M1711 Unilateral primary osteoarthritis, right knee: Secondary | ICD-10-CM | POA: Diagnosis not present

## 2021-01-16 MED ORDER — LIDOCAINE HCL 1 % IJ SOLN
1.5000 mL | INTRAMUSCULAR | Status: AC | PRN
  Administered 2021-01-16: 1.5 mL

## 2021-01-16 MED ORDER — HYALURONAN 30 MG/2ML IX SOSY
30.0000 mg | PREFILLED_SYRINGE | INTRA_ARTICULAR | Status: AC | PRN
  Administered 2021-01-16: 30 mg via INTRA_ARTICULAR

## 2021-01-16 NOTE — Progress Notes (Signed)
   Procedure Note  Patient: Melissa Perez             Date of Birth: 1946/03/08           MRN: [id number]             Visit Date: 01/16/2021  Procedures: Visit Diagnoses:  1. Primary osteoarthritis of right knee    Orthovisc #3 Right knee joint injection  Large Joint Inj: R knee on 01/16/2021 7:55 AM Indications: pain Details: 25 G 1.5 in needle, medial approach  Arthrogram: No  Medications: 1.5 mL lidocaine 1 %; 30 mg Hyaluronan 30 MG/2ML Aspirate: 0 mL Outcome: tolerated well, no immediate complications Procedure, treatment alternatives, risks and benefits explained, specific risks discussed. Consent was given by the patient. Immediately prior to procedure a time out was called to verify the correct patient, procedure, equipment, support staff and site/side marked as required. Patient was prepped and draped in the usual sterile fashion.     Patient tolerated the procedure well.  Aftercare was discussed.  Hazel Sams, PA-C

## 2021-01-17 ENCOUNTER — Encounter: Payer: Medicare PPO | Admitting: Occupational Therapy

## 2021-01-24 ENCOUNTER — Encounter: Payer: Medicare PPO | Admitting: Occupational Therapy

## 2021-01-30 ENCOUNTER — Telehealth: Payer: Self-pay | Admitting: Rheumatology

## 2021-01-30 ENCOUNTER — Other Ambulatory Visit: Payer: Self-pay | Admitting: Physician Assistant

## 2021-01-30 DIAGNOSIS — M0579 Rheumatoid arthritis with rheumatoid factor of multiple sites without organ or systems involvement: Secondary | ICD-10-CM

## 2021-01-30 NOTE — Telephone Encounter (Signed)
Patient advised we received her PLQ eye exam which has been noted in her chart. Patient advised to have the eye doctor send the updated exam when she goes in March. Patient expressed understanding.

## 2021-01-30 NOTE — Telephone Encounter (Signed)
Patient calling to let you know she had a Plaquenil Eye exam September 2021, and is scheduled for her next one March 2022. Dr. Gertie Exon off will fax over September one today. Please call to advise.

## 2021-01-30 NOTE — Telephone Encounter (Signed)
Last Visit: 12/15/2020 Next Visit: 03/30/2021 Labs: 12/15/2020, CMP WNL. MCV and MCH remain borderline elevated. Please make sure the patient is taking folic acid 2 mg daily. We will continue to monitor.  Eye exam: 02/11/2020 f/u 6 months, LMOM for patient to have PLQ performed  Current Dose per office note 12/15/2020, Plaquenil 200 mg 1 tablet by mouth daily. DX: Rheumatoid arthritis  Last Fill:  10/05/2020  Okay to refill Plaquenil?

## 2021-02-08 ENCOUNTER — Other Ambulatory Visit: Payer: Self-pay | Admitting: Family Medicine

## 2021-02-08 DIAGNOSIS — Z1231 Encounter for screening mammogram for malignant neoplasm of breast: Secondary | ICD-10-CM

## 2021-02-28 ENCOUNTER — Other Ambulatory Visit: Payer: Self-pay | Admitting: Rheumatology

## 2021-02-28 DIAGNOSIS — M0579 Rheumatoid arthritis with rheumatoid factor of multiple sites without organ or systems involvement: Secondary | ICD-10-CM

## 2021-02-28 NOTE — Telephone Encounter (Signed)
Last Visit: 12/15/2020 Next Visit: 03/30/2021 Labs: 12/15/2020, CMP WNL. MCV and MCH remain borderline elevated. Please make sure the patient is taking folic acid 2 mg daily. We will continue to monitor.  Eye exam: 08/31/2020  Current Dose per office note 12/15/2020, Plaquenil 200 mg 1 tablet by mouth daily. DX: Rheumatoid arthritis  Last Fill: 01/30/2021  Okay to refill Plaquenil?

## 2021-03-01 ENCOUNTER — Other Ambulatory Visit: Payer: Self-pay

## 2021-03-01 MED ORDER — PREDNISONE 5 MG PO TABS: ORAL_TABLET | ORAL | 0 refills | Status: DC

## 2021-03-01 NOTE — Telephone Encounter (Signed)
I called patient, patient verbalized understanding. 

## 2021-03-01 NOTE — Telephone Encounter (Signed)
Patient called stating she is having "excrutiating" right hand pain especially in her knuckles.  Patient was offered an appointment to be seen, but requested to speak with the nurse before scheduling.  Patient states she needs "relief from the pain" and is not sure that coming in will help or if she needs a medication called into the pharmacy.  Please advise.

## 2021-03-01 NOTE — Telephone Encounter (Signed)
Had right wrist fracture 09/2020, pain in hand above wrist worsening x 1 week, weakness, swelling, difficulty with movement, redness, warm to the touch, used ice, heat, Motrin, Tylenol, Voltaren gel, brace, no new injury. [place]

## 2021-03-01 NOTE — Telephone Encounter (Signed)
Ok to send in prednisone taper starting at 20 mg tapering by 5 mg every 4 days.  If her discomfort persists or worsens she will need to return to the office to discuss other treatment options.

## 2021-03-02 DIAGNOSIS — H43813 Vitreous degeneration, bilateral: Secondary | ICD-10-CM | POA: Diagnosis not present

## 2021-03-02 DIAGNOSIS — Z79899 Other long term (current) drug therapy: Secondary | ICD-10-CM | POA: Diagnosis not present

## 2021-03-02 DIAGNOSIS — M0570 Rheumatoid arthritis with rheumatoid factor of unspecified site without organ or systems involvement: Secondary | ICD-10-CM | POA: Diagnosis not present

## 2021-03-15 ENCOUNTER — Telehealth: Payer: Self-pay | Admitting: Rheumatology

## 2021-03-15 ENCOUNTER — Other Ambulatory Visit: Payer: Self-pay

## 2021-03-15 DIAGNOSIS — Z79899 Other long term (current) drug therapy: Secondary | ICD-10-CM

## 2021-03-15 NOTE — Telephone Encounter (Signed)
#  1- lab orders have been released for [facility].   #2- Eye exam has been received and documented from exam on 03/02/2021.  I called patient and advised her of both. Patient verbalized understanding.

## 2021-03-15 NOTE — Telephone Encounter (Signed)
#  1.Patient request orders for Labs to be released to [facility] on [address]. Patient going for lab draw tomorrow.  #2.Patient also wanted to make sure we received Plaquenil Eye Exam report from Dr. Marylynn Pearson at [place]. Please call patient to advise.

## 2021-03-16 DIAGNOSIS — Z79899 Other long term (current) drug therapy: Secondary | ICD-10-CM | POA: Diagnosis not present

## 2021-03-16 NOTE — Progress Notes (Signed)
Office Visit Note  Patient: Melissa Perez             Date of Birth: Apr 06, 1946           MRN: [id number]             PCP: Lucianne Lei, MD Referring: Lucianne Lei, MD Visit Date: 03/30/2021 Occupation: @GUAROCC @  Subjective:  Right knee pain.   History of Present Illness: Melissa Perez is a 75 y.o. female with a history of from rheumatoid arthritis.  She had a flare of rheumatoid arthritis in her right hand about 10 days ago for which she took a prednisone taper and the symptoms have subsided.  She states she had a good response to Visco supplement injection to the right knee joint but the right knee joint has a started hurting again.  She states she has more flares when the weather changes.  Activities of Daily Living:  Patient reports morning stiffness for 1-2 minutes.   Patient Denies nocturnal pain.  Difficulty dressing/grooming: Denies Difficulty climbing stairs: Denies Difficulty getting out of chair: Denies Difficulty using hands for taps, buttons, cutlery, and/or writing: Reports  Review of Systems  Constitutional: Positive for fatigue. Negative for night sweats, weight gain and weight loss.  HENT: Negative for mouth sores, trouble swallowing, trouble swallowing, mouth dryness and nose dryness.   Eyes: Negative for pain, redness, itching, visual disturbance and dryness.  Respiratory: Negative for cough, hemoptysis, shortness of breath and difficulty breathing.   Cardiovascular: Negative for chest pain, palpitations, hypertension, irregular heartbeat and swelling in legs/feet.  Gastrointestinal: Negative for abdominal pain, blood in stool, constipation and diarrhea.  Endocrine: Negative for increased urination.  Genitourinary: Negative for painful urination and vaginal dryness.  Musculoskeletal: Positive for arthralgias, joint pain and morning stiffness. Negative for joint swelling, myalgias, muscle weakness, muscle tenderness and myalgias.  Skin: Negative for color change,  rash, hair loss, redness, skin tightness, ulcers and sensitivity to sunlight.  Allergic/Immunologic: Negative for susceptible to infections.  Neurological: Positive for weakness. Negative for dizziness, numbness, headaches, memory loss and night sweats.  Hematological: Negative for swollen glands.  Psychiatric/Behavioral: Negative for depressed mood, confusion and sleep disturbance. The patient is not nervous/anxious.     PMFS History:  Patient Active Problem List   Diagnosis Date Noted  . Fracture, [place], right, closed 11/16/2020  . Pruritus of scalp 06/20/2018  . DDD (degenerative disc disease), lumbar 05/27/2018  . Essential hypertension 03/19/2017  . History of pityriasis rosea 03/19/2017  . 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 History of malignant neoplasm of breast 03/19/2017  . Eczema 03/05/2017  . Vulvar dermatitis 03/05/2017  . Rheumatoid arthritis  10/15/2016  . High risk medication use 10/15/2016  . 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 S/P lumpectomy of breast 12/03/2011  . 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[place]) 11/01/2011    Past Medical History:  Diagnosis Date  . Arthritis    rheumatoid, take Humira  . Arthritis    rheumatoid  . Arthritis pain   . Back pain   . Breast cancer ([place])   . Cancer ([place])   . History of dental surgery   . Hyperlipidemia   . Hypertension   . Personal history of radiation therapy   . Rash   . Rheumatoid arthritis ([place])   . S/P lumpectomy of breast 12/03/2011    Family History  Problem Relation Age of Onset  . Breast cancer  Mother   . Cancer Mother   . Kidney cancer Father   . Cancer Father   . Breast cancer Sister   . Cancer Sister   . Lung cancer Sister   . Colon cancer Maternal Uncle   . Liver cancer Paternal Aunt    Past Surgical History:  Procedure Laterality Date  . ABDOMINAL HYSTERECTOMY    . APPENDECTOMY    . BREAST EXCISIONAL BIOPSY Left 1991    benign  . BREAST LUMPECTOMY Right 11/2011  . BREAST SURGERY     2 left breast biopsies-benign  . left breast biopsies  [place]   Social History   Social History Narrative   Adult Son in the [employer] History  Administered Date(s) Administered  . Fluad Quad(high Dose 65+) 08/12/2019  . Influenza, High Dose Seasonal PF 09/10/2017, 09/14/2020  . Influenza-Unspecified 07/03/2018  . PFIZER(Purple Top)SARS-COV-2 Vaccination 01/14/2020, 02/06/2020, 08/05/2020  . Zoster 11/24/2012     Objective: Vital Signs: BP 129/70 (BP Location: Left Arm, Patient Position: Sitting, Cuff Size: Normal)   Pulse 76   Ht 5' 1.75" (1.568 m)   Wt 140 lb 9.6 oz (63.8 kg)   BMI 25.92 kg/m    Physical Exam Vitals and nursing note reviewed.  Constitutional:      Appearance: She is well-developed.  HENT:     Head: Normocephalic and atraumatic.  Eyes:     Conjunctiva/sclera: Conjunctivae normal.  Cardiovascular:     Rate and Rhythm: Normal rate and regular rhythm.     Heart sounds: Normal heart sounds.  Pulmonary:     Effort: Pulmonary effort is normal.     Breath sounds: Normal breath sounds.  Abdominal:     General: Bowel sounds are normal.     Palpations: Abdomen is soft.  Musculoskeletal:     Cervical back: Normal range of motion.  Lymphadenopathy:     Cervical: No cervical adenopathy.  Skin:    General: Skin is warm and dry.     Capillary Refill: Capillary refill takes less than 2 seconds.  Neurological:     Mental Status: She is alert and oriented to person, place, and time.  Psychiatric:        Behavior: Behavior normal.      Musculoskeletal Exam: C-spine thoracic and lumbar spine were in good range of motion.  Shoulder joints, and elbow joints with good range of motion.  She discomfort range of motion of her right wrist joints with mild warmth and tenderness on palpation.  No MCP swelling or tenderness was noted.  Hip joints and knee joints in good range of motion.   No warmth swelling or effusion was noted in the knee joint.  There is no tenderness over ankles or MTPs.  CDAI Exam: CDAI Score: 3.6  Patient Global: 3 mm; Provider Global: 3 mm Swollen: 1 ; Tender: 2  Joint Exam 03/30/2021      Right  Left  Wrist  Swollen Tender     Knee   Tender        Investigation: No additional findings.  Imaging: No results found.  Recent Labs: Lab Results  Component Value Date   WBC 9.4 03/16/2021   HGB 12.9 03/16/2021   PLT 215 03/16/2021   NA 139 03/16/2021   K 4.1 03/16/2021   CL 100 03/16/2021   CO2 29 03/16/2021   GLUCOSE 117 03/16/2021   BUN 14 03/16/2021   CREATININE 0.76 03/16/2021   BILITOT 0.8 03/16/2021  ALKPHOS 53 06/20/2017   AST 35 03/16/2021   ALT 28 03/16/2021   PROT 6.6 03/16/2021   ALBUMIN 3.8 06/20/2017   CALCIUM 9.5 03/16/2021   GFRAA 90 03/16/2021    Speciality Comments: PLQ Eye Exam: 03/02/2021  normal @ [facility] [place].  Humira-SEs  Procedures:  No procedures performed Allergies: Dilaudid [hydromorphone], Dilaudid [hydromorphone hcl], and Hydrocodone   Assessment / Plan:     Visit Diagnoses: Rheumatoid arthritis-patient had recent flare for which she required prednisone taper.  She has been off prednisone now.  She still have some discomfort in her right wrist joint.  She has tenderness and warmth on palpation of her right wrist joint.  She also complained of right knee joint discomfort.  She had Visco supplement injections in February.  No warmth swelling or effusion was noted.  I detailed discussion regarding other treatment options with the patient.  She is taking Humira in the past and did not like the side effects and discontinued the medication.   We decided to proceed with subcutaneous methotrexate.  We will apply for Rasuvo 17.5 mg subcu weekly.  Once approved she can come in the office to start on Rasuvo and will discontinue oral methotrexate.  Side effects of Rasuvo were discussed.  High risk  medication use - Methotrexate 2.5 mg 7 tablets every 7 days, folic acid 1 mg 2 tablets daily, and Plaquenil 200 mg 1 tablet by mouth daily. PLQ Eye Exam: 03/02/2021   Primary osteoarthritis of right knee - moderate osteoarthritis and moderate chondromalacia patella.  MRI of the right knee was performed 08/24/2020 which revealed a small joint effusion and synovitis.  She had no warmth swelling or effusion on my examination today.  She had recent Visco supplement injections.  I advised her to try some lower extremity muscle strength exercises and topical analgesic.  Pain in left ankle and joints of left foot - Resolved.   DDD (degenerative disc disease), lumbar - She has seen Dr. Mina Marble in the past  Chronic right SI joint pain - Resolved   Age-related osteoporosis without current pathological fracture - DEXA ordered by Dr. Criss Rosales on 06/06/2020 AP spine BMD 0.871 with T score -2.5.  She receives Prolia 60 mg subcutaneous injections every 6 months.  History of malignant neoplasm of breast  History of hypertension-blood pressure is normal today.  Morton's neuroma, unspecified laterality  Positive PPD, treated  History of pityriasis rosea  Orders: No orders of the defined types were placed in this encounter.  No orders of the defined types were placed in this encounter.     Follow-Up Instructions: Return in about 3 months (around 06/29/2021) for Rheumatoid arthritis, Osteoarthritis.   Bo Merino, MD  Note - This record has been created using Editor, commissioning.  Chart creation errors have been sought, but may not always  have been located. Such creation errors do not reflect on  the standard of medical care.

## 2021-03-17 LAB — COMPLETE METABOLIC PANEL WITH GFR
AG Ratio: 2.3 (calc) (ref 1.0–2.5)
ALT: 28 U/L (ref 6–29)
AST: 35 U/L (ref 10–35)
Albumin: 4.6 g/dL (ref 3.6–5.1)
Alkaline phosphatase (APISO): 55 U/L (ref 37–153)
BUN: 14 mg/dL (ref 7–25)
CO2: 29 mmol/L (ref 20–32)
Calcium: 9.5 mg/dL (ref 8.6–10.4)
Chloride: 100 mmol/L (ref 98–110)
Creat: 0.76 mg/dL (ref 0.60–0.93)
GFR, Est African American: 90 mL/min/{1.73_m2} (ref >=60)
GFR, Est Non African American: 78 mL/min/{1.73_m2} (ref >=60)
Globulin: 2 g/dL (calc) (ref 1.9–3.7)
Glucose, Bld: 117 mg/dL (ref 65–139)
Potassium: 4.1 mmol/L (ref 3.5–5.3)
Sodium: 139 mmol/L (ref 135–146)
Total Bilirubin: 0.8 mg/dL (ref 0.2–1.2)
Total Protein: 6.6 g/dL (ref 6.1–8.1)

## 2021-03-17 LAB — CBC WITH DIFFERENTIAL/PLATELET
Absolute Monocytes: 338 cells/uL (ref 200–950)
Basophils Absolute: 28 cells/uL (ref 0–200)
Basophils Relative: 0.3 %
Eosinophils Absolute: 28 cells/uL (ref 15–500)
Eosinophils Relative: 0.3 %
HCT: 36.9 % (ref 35.0–45.0)
Hemoglobin: 12.9 g/dL (ref 11.7–15.5)
Lymphs Abs: 780 cells/uL — ABNORMAL LOW (ref 850–3900)
MCH: 36.8 pg — ABNORMAL HIGH (ref 27.0–33.0)
MCHC: 35 g/dL (ref 32.0–36.0)
MCV: 105.1 fL — ABNORMAL HIGH (ref 80.0–100.0)
MPV: 10.1 fL (ref 7.5–12.5)
Monocytes Relative: 3.6 %
Neutro Abs: 8225 cells/uL — ABNORMAL HIGH (ref 1500–7800)
Neutrophils Relative %: 87.5 %
Platelets: 215 10*3/uL (ref 140–400)
RBC: 3.51 10*6/uL — ABNORMAL LOW (ref 3.80–5.10)
RDW: 13.6 % (ref 11.0–15.0)
Total Lymphocyte: 8.3 %
WBC: 9.4 10*3/uL (ref 3.8–10.8)

## 2021-03-18 ENCOUNTER — Other Ambulatory Visit: Payer: Self-pay | Admitting: Physician Assistant

## 2021-03-20 NOTE — Progress Notes (Signed)
CMP is normal, MCV is elevated.  Please advise patient to take folic acid 2 mg on a regular basis.

## 2021-03-20 NOTE — Telephone Encounter (Signed)
Next Visit: 03/30/2021  Last Visit: 12/15/2020  Last Fill: 12/26/2020  DX: Rheumatoid arthritis  Current Dose per office note 12/15/2020, Methotrexate 2.5 mg 7 tablets every 7 days,   Labs: 03/16/2021, CMP is normal, MCV is elevated. Please advise patient to take folic acid 2 mg on a regular basis  Okay to refill MTX?

## 2021-03-30 ENCOUNTER — Other Ambulatory Visit: Payer: Self-pay

## 2021-03-30 ENCOUNTER — Ambulatory Visit: Payer: Medicare PPO | Admitting: Rheumatology

## 2021-03-30 ENCOUNTER — Telehealth: Payer: Self-pay | Admitting: Pharmacist

## 2021-03-30 ENCOUNTER — Encounter: Payer: Self-pay | Admitting: Rheumatology

## 2021-03-30 VITALS — BP 129/70 | HR 76 | Ht 61.75 in | Wt 140.6 lb

## 2021-03-30 DIAGNOSIS — M810 Age-related osteoporosis without current pathological fracture: Secondary | ICD-10-CM | POA: Diagnosis not present

## 2021-03-30 DIAGNOSIS — R7611 Nonspecific reaction to tuberculin skin test without active tuberculosis: Secondary | ICD-10-CM

## 2021-03-30 DIAGNOSIS — M5136 Other intervertebral disc degeneration, lumbar region: Secondary | ICD-10-CM

## 2021-03-30 DIAGNOSIS — M1711 Unilateral primary osteoarthritis, right knee: Secondary | ICD-10-CM

## 2021-03-30 DIAGNOSIS — M25572 Pain in left ankle and joints of left foot: Secondary | ICD-10-CM

## 2021-03-30 DIAGNOSIS — G8929 Other chronic pain: Secondary | ICD-10-CM

## 2021-03-30 DIAGNOSIS — M533 Sacrococcygeal disorders, not elsewhere classified: Secondary | ICD-10-CM | POA: Diagnosis not present

## 2021-03-30 DIAGNOSIS — Z853 Personal history of malignant neoplasm of breast: Secondary | ICD-10-CM

## 2021-03-30 DIAGNOSIS — M0579 Rheumatoid arthritis with rheumatoid factor of multiple sites without organ or systems involvement: Secondary | ICD-10-CM | POA: Diagnosis not present

## 2021-03-30 DIAGNOSIS — G5760 Lesion of plantar nerve, unspecified lower limb: Secondary | ICD-10-CM

## 2021-03-30 DIAGNOSIS — Z872 Personal history of diseases of the skin and subcutaneous tissue: Secondary | ICD-10-CM

## 2021-03-30 DIAGNOSIS — Z8679 Personal history of other diseases of the circulatory system: Secondary | ICD-10-CM | POA: Diagnosis not present

## 2021-03-30 DIAGNOSIS — Z79899 Other long term (current) drug therapy: Secondary | ICD-10-CM | POA: Diagnosis not present

## 2021-03-30 NOTE — Patient Instructions (Signed)
Standing Labs We placed an order today for your standing lab work.   Please have your standing labs drawn in July and every 3 months  If possible, please have your labs drawn 2 weeks prior to your appointment so that the provider can discuss your results at your appointment.  We have open lab daily Monday through Thursday from 1:30-4:30 PM and Friday from 1:30-4:00 PM at the office of Dr. Bo Merino, [facility].   Please be advised, all patients with office appointments requiring lab work will take precedents over walk-in lab work.  If possible, please come for your lab work on Monday and Friday afternoons, as you may experience shorter wait times. The office is located at [address], [place], [place], [place] [address] No appointment is necessary.   Labs are drawn by [facility]. Please bring your co-pay at the time of your lab draw.  You may receive a bill from [facility] for your lab work.  If you wish to have your labs drawn at another location, please call the office 24 hours in advance to send orders.  If you have any questions regarding directions or hours of operation,  please call [phone].   As a reminder, please drink plenty of water prior to coming for your lab work. Thanks!  COVID-19 vaccine recommendations:   COVID-19 vaccine is recommended for everyone (unless you are allergic to a vaccine component), even if you are on a medication that suppresses your immune system.   If you are on Methotrexate, Cellcept (mycophenolate), Rinvoq, Morrie Sheldon, and Olumiant- hold the medication for 1 week after each vaccine. Hold Methotrexate for 2 weeks after the single dose COVID-19 vaccine.   The recommendations are that the patients on immunosuppressive therapy should get the first 3 COVID-19 vaccines a month apart and then a fourth dose (booster) 3 months after the third dose.   Do not take Tylenol or any anti-inflammatory medications (NSAIDs) 24 hours prior to the  COVID-19 vaccination.   There is no direct evidence about the efficacy of the COVID-19 vaccine in individuals who are on medications that suppress the immune system.   Even if you are fully vaccinated, and you are on any medications that suppress your immune system, please continue to wear a mask, maintain at least six feet social distance and practice hand hygiene.   If you develop a COVID-19 infection, please contact your PCP or our office to determine if you need monoclonal antibody infusion.  The booster vaccine is now available for immunocompromised patients.   Please see the following web sites for updated information.   [email or web address]  Vaccines You are taking a medication(s) that can suppress your immune system.  The following immunizations are recommended: . Flu annually . Covid-19  . Pneumonia (Pneumovax 23 and Prevnar 13 spaced at least 1 year apart) . Shingrix (after age 64)  Please check with your PCP to make sure you are up to date.   Heart Disease Prevention   Your inflammatory disease increases your risk of heart disease which includes heart attack, stroke, atrial fibrillation (irregular heartbeats), high blood pressure, heart failure and atherosclerosis (plaque in the arteries).  It is important to reduce your risk by:   . Keep blood pressure, cholesterol, and blood sugar at healthy levels   . Smoking Cessation   . Maintain a healthy weight  o BMI 20-25   . Eat a healthy diet  o Plenty of fresh fruit, vegetables, and whole grains  o Limit saturated  fats, foods high in sodium, and added sugars  o DASH and Mediterranean diet   . Increase physical activity  o Recommend moderate physically activity for 150 minutes per week/ 30 minutes a day for five days a week These can be broken up into three separate ten-minute sessions during the day.   . Reduce Stress  . Meditation, slow breathing  exercises, yoga, coloring books  . Dental visits twice a year

## 2021-03-30 NOTE — Progress Notes (Signed)
Pharmacy Note  Subjective: Patient presents today to [facility] for follow up office visit. Patient seen by the pharmacist for counseling on methotrexate for rheumatoid arthritis. Prior therapy includes: oral MTX (Current), Humira (side effects, but unable to find in Epic, may be in [facility]), hydroxychloroquine (current)  Objective: CBC    Component Value Date/Time   WBC 9.4 03/16/2021 1332   RBC 3.51 (L) 03/16/2021 1332   HGB 12.9 03/16/2021 1332   HGB 11.9 07/02/2013 0921   HCT 36.9 03/16/2021 1332   HCT 35.1 07/02/2013 0921   PLT 215 03/16/2021 1332   PLT 216 07/02/2013 0921   MCV 105.1 (H) 03/16/2021 1332   MCV 94.4 07/02/2013 0921   MCH 36.8 (H) 03/16/2021 1332   MCHC 35.0 03/16/2021 1332   RDW 13.6 03/16/2021 1332   RDW 14.3 07/02/2013 0921   LYMPHSABS 780 (L) 03/16/2021 1332   LYMPHSABS 1.5 07/02/2013 0921   MONOABS 486 06/20/2017 0958   MONOABS 0.3 07/02/2013 0921   EOSABS 28 03/16/2021 1332   EOSABS 0.1 07/02/2013 0921   BASOSABS 28 03/16/2021 1332   BASOSABS 0.0 07/02/2013 0921    CMP     Component Value Date/Time   NA 139 03/16/2021 1332   NA 141 06/22/2016 0000   NA 143 07/02/2013 0922   K 4.1 03/16/2021 1332   K 3.9 07/02/2013 0922   CL 100 03/16/2021 1332   CL 103 12/31/2012 0850   CO2 29 03/16/2021 1332   CO2 27 07/02/2013 0922   GLUCOSE 117 03/16/2021 1332   GLUCOSE 92 07/02/2013 0922   GLUCOSE 96 12/31/2012 0850   BUN 14 03/16/2021 1332   BUN 17 06/22/2016 0000   BUN 17.4 07/02/2013 0922   CREATININE 0.76 03/16/2021 1332   CREATININE 0.9 07/02/2013 0922   CALCIUM 9.5 03/16/2021 1332   CALCIUM 9.5 07/02/2013 0922   PROT 6.6 03/16/2021 1332   PROT 6.9 07/02/2013 0922   ALBUMIN 3.8 06/20/2017 0958   ALBUMIN 3.6 07/02/2013 0922   AST 35 03/16/2021 1332   AST 24 07/02/2013 0922   ALT 28 03/16/2021 1332   ALT 18 07/02/2013 0922   ALKPHOS 53 06/20/2017 0958   ALKPHOS 59 07/02/2013 0922   BILITOT 0.8 03/16/2021 1332   BILITOT 0.31  07/02/2013 0922   GFRNONAA 78 03/16/2021 1332   GFRAA 90 03/16/2021 1332    Baseline Immunosuppressant Therapy Labs TB GOLD   Hepatitis Panel   HIV No results found for: HIV Immunoglobulins   SPEP Serum Protein Electrophoresis Latest Ref Rng & Units 03/16/2021  Total Protein 6.1 - 8.1 g/dL 6.6    Chest-xray:  01/09/12 wnl - no active CV disease  Contraception: N/A  Alcohol use: none  Assessment/Plan:   Patient was counseled on the purpose, proper use, and adverse effects of Rasuvo including nausea, infection, and signs and symptoms of pneumonitis. Discussed that there is the possibility of an increased risk of malignancy, specifically lymphomas, but it is not well understood if this increased risk is due to the medication or the disease state.  Instructed patient that medication should be held for infection and prior to surgery.    Reviewed instructions with patient to take Rasuvo weekly along with folic acid daily.  Discussed the importance of frequent monitoring of kidney and liver function and blood counts, and provided patient with standing lab instructions.  Counseled patient to avoid NSAIDs and alcohol while on methotrexate.  Patient taking MTX 17.5mg  orally daily.   Dose of methotrexate will  be 17.5mg  weekly  along with folic acid 2mg  daily. Prescription pending insurance coverage. Patient provided with Rasuvo patient assistance application while we start BIV.  Knox Saliva, PharmD, MPH Clinical Pharmacist (Rheumatology and Pulmonology)

## 2021-03-30 NOTE — Telephone Encounter (Signed)
Submitted a Prior Authorization request to [facility] for RASUVO via CoverMyMeds. Will update once we receive a response.   KeyLanney Gins - PA Case ID: [id number]

## 2021-03-30 NOTE — Telephone Encounter (Signed)
Please start Rasuvo BIV.  Dose: 17.5mg  every 7 days  Dx: M05.79 (rheumatoid arthritis)  Previously tried therapies: . Humira - side effects but unable to find exactly what, may be in [facility]  Current regimen: methotrexate tablets, hydroxychloroquine  Patient provided with patient assistance application today to complete and bring back with income documents.  Knox Saliva, PharmD, MPH Clinical Pharmacist (Rheumatology and Pulmonology)

## 2021-03-31 ENCOUNTER — Other Ambulatory Visit (HOSPITAL_COMMUNITY): Payer: Self-pay

## 2021-03-31 NOTE — Telephone Encounter (Signed)
Ran test claim to determine copay for 41-month supply. Plan covers $512.05 leaving pt with $50 copay.

## 2021-03-31 NOTE — Telephone Encounter (Signed)
Called patient, left message to discuss cost and follow up on patient assistance.

## 2021-03-31 NOTE — Telephone Encounter (Signed)
Received notification from [facility] regarding a prior authorization for RASUVO. Authorization has been APPROVED from 12/03/2020 to 12/02/2021.   KeyLanney Gins - PA Case ID: [id number] Phone # [phone]

## 2021-04-03 NOTE — Telephone Encounter (Addendum)
Rasuvo PAP application including signed provider portion, insurance copy, and med list. Still awaiting response from patient regarding her ability to pay $50 per month through insurance.  Filed in pending PAP folder next to [facility]' desk  Sent copy of provider portion to scan center  Knox Saliva, PharmD, MPH Clinical Pharmacist (Rheumatology and Pulmonology)

## 2021-04-04 ENCOUNTER — Other Ambulatory Visit: Payer: Self-pay

## 2021-04-04 ENCOUNTER — Ambulatory Visit
Admission: RE | Admit: 2021-04-04 | Discharge: 2021-04-04 | Disposition: A | Discharge status: Home / Self Care | Payer: Medicare PPO | Source: Ambulatory Visit | Attending: Family Medicine | Admitting: Family Medicine

## 2021-04-04 DIAGNOSIS — Z1231 Encounter for screening mammogram for malignant neoplasm of breast: Secondary | ICD-10-CM | POA: Diagnosis not present

## 2021-04-06 NOTE — Telephone Encounter (Addendum)
Reached out to pt again, left messages on home phone and cell phone requesting call back to discuss denial for Rasuvo PAP and $50 copay.  Pt was denied PAP due to income level, unfortunately her income was too high to allow for appeals.

## 2021-04-07 MED ORDER — RASUVO 17.5 MG/0.35ML ~~LOC~~ SOAJ: 17.5000 mg | SUBCUTANEOUS | 0 refills | Status: DC

## 2021-04-07 NOTE — Telephone Encounter (Signed)
Next Visit: 06/29/2021  Last Visit: 03/30/2021  Last Fill: new start  DX: Rheumatoid arthritis  Current Dose per office note 03/30/2021, 17.5mg  weekly  along   Labs: 03/16/2021, CMP is normal, MCV is elevated. Please advise patient to take folic acid 2 mg on a regular basis.  Okay to refill Rasuvo?

## 2021-04-07 NOTE — Telephone Encounter (Signed)
Spoke with pt informed her of the $50 monthly copay for her Rasuvo, to which she was agreeable. We can proceed with sending in New Rx to her preferred retail pharmacy.

## 2021-04-24 ENCOUNTER — Telehealth: Payer: Self-pay

## 2021-04-24 NOTE — Telephone Encounter (Signed)
Dr. Estanislado Pandy received notification that patient received a [id number] booster on 04/20/2021. I attempted to contact patient and left message on machine to advise patient to hold Methotrexate for one week after vaccine per Dr. Estanislado Pandy.

## 2021-05-02 DIAGNOSIS — E785 Hyperlipidemia, unspecified: Secondary | ICD-10-CM | POA: Diagnosis not present

## 2021-05-02 DIAGNOSIS — M064 Inflammatory polyarthropathy: Secondary | ICD-10-CM | POA: Diagnosis not present

## 2021-05-02 DIAGNOSIS — K2100 Gastro-esophageal reflux disease with esophagitis, without bleeding: Secondary | ICD-10-CM | POA: Diagnosis not present

## 2021-05-02 DIAGNOSIS — M069 Rheumatoid arthritis, unspecified: Secondary | ICD-10-CM | POA: Diagnosis not present

## 2021-05-02 DIAGNOSIS — I10 Essential (primary) hypertension: Secondary | ICD-10-CM | POA: Diagnosis not present

## 2021-05-02 DIAGNOSIS — Z0001 Encounter for general adult medical examination with abnormal findings: Secondary | ICD-10-CM | POA: Diagnosis not present

## 2021-05-16 ENCOUNTER — Other Ambulatory Visit (HOSPITAL_COMMUNITY): Payer: Self-pay | Admitting: *Deleted

## 2021-05-18 ENCOUNTER — Ambulatory Visit (HOSPITAL_COMMUNITY)
Admission: RE | Admit: 2021-05-18 | Discharge: 2021-05-18 | Disposition: A | Discharge status: Home / Self Care | Payer: Medicare PPO | Source: Ambulatory Visit | Attending: Family Medicine | Admitting: Family Medicine

## 2021-05-18 ENCOUNTER — Other Ambulatory Visit: Payer: Self-pay

## 2021-05-18 DIAGNOSIS — M810 Age-related osteoporosis without current pathological fracture: Secondary | ICD-10-CM | POA: Diagnosis not present

## 2021-05-18 MED ORDER — DENOSUMAB 60 MG/ML ~~LOC~~ SOSY
PREFILLED_SYRINGE | SUBCUTANEOUS | Status: AC
  Filled 2021-05-18: qty 1

## 2021-05-18 MED ORDER — DENOSUMAB 60 MG/ML ~~LOC~~ SOSY
60.0000 mg | PREFILLED_SYRINGE | Freq: Once | SUBCUTANEOUS | Status: AC
  Administered 2021-05-18: 60 mg via SUBCUTANEOUS

## 2021-05-27 ENCOUNTER — Other Ambulatory Visit: Payer: Self-pay | Admitting: Physician Assistant

## 2021-05-27 DIAGNOSIS — M0579 Rheumatoid arthritis with rheumatoid factor of multiple sites without organ or systems involvement: Secondary | ICD-10-CM

## 2021-05-29 NOTE — Telephone Encounter (Signed)
Last Visit: 03/30/2021 Next Visit: 06/29/2021 Labs: 03/16/2021 CMP is normal, MCV is elevated.  Eye exam: 03/02/2021   Current Dose per office note on 03/30/2021: Plaquenil 200 mg 1 tablet by mouth daily DX: Rheumatoid arthritis  Last Fill: 02/28/2021  Okay to refill Plaquenil?

## 2021-06-09 ENCOUNTER — Other Ambulatory Visit: Payer: Self-pay | Admitting: Physician Assistant

## 2021-06-12 ENCOUNTER — Other Ambulatory Visit: Payer: Self-pay | Admitting: Physician Assistant

## 2021-06-12 ENCOUNTER — Telehealth: Payer: Self-pay

## 2021-06-12 NOTE — Telephone Encounter (Signed)
Melissa Perez called stating she received a call from [facility] letting her know that Dr. Estanislado Pandy denied the refill request for her Methotrexate.  Melissa Perez requested a return call.

## 2021-06-12 NOTE — Telephone Encounter (Addendum)
Patient advised prescription was denied as it was to early to refill. Patient advised to call when she gets to her second to last pen to call the office and we will refill.

## 2021-06-15 ENCOUNTER — Telehealth: Payer: Self-pay

## 2021-06-15 ENCOUNTER — Other Ambulatory Visit: Payer: Self-pay | Admitting: *Deleted

## 2021-06-15 DIAGNOSIS — Z79899 Other long term (current) drug therapy: Secondary | ICD-10-CM

## 2021-06-15 NOTE — Telephone Encounter (Signed)
Patient left a voicemail requesting her labwork orders be sent to [facility] on [address].  Patient states she will be going tomorrow, 06/16/21.

## 2021-06-15 NOTE — Progress Notes (Signed)
Office Visit Note  Patient: Melissa Perez             Date of Birth: [birth year]           MRN: [id number]             PCP: Lucianne Lei, MD Referring: Lucianne Lei, MD Visit Date: 06/29/2021 Occupation: @GUAROCC @  Subjective:  Medication monitoring   History of Present Illness: Melissa Perez is a 75 y.o. female with history of rheumatoid arthritis and osteoarthritis.  Patient is taking rasuvo 17.5 mg sq injections once weekly, folic acid 2 mg by mouth daily, and Plaquenil 200 mg 1 tablet by mouth daily.  She has not missed any doses recently and is tolerating both medications without any side effects.  She denies any injection site reactions with the Rasuvo.  Overall she has been happy with the decision to switch from oral methotrexate to injectable methotrexate.  She states that she continues to have improvements in her right wrist pain and range of motion.  She denies any joint swelling at this time.  She has started to try to increase her strengthening and stretching regimen since she is no longer having to focus on the rehab for her right wrist.  She has had some increased discomfort in her right shoulder and has been applying Voltaren gel topically as needed for pain relief.  She also has intermittent discomfort in the right knee joint but denies any joint swelling at this time.  She has not needed to take prednisone recently.  She has been walking 2 miles on a daily basis for exercise.   She received her Prolia injection at Dr. Fransico Setters office in June 2022.  She continues to take a calcium and vitamin D supplement on a daily basis. She denies any recent infections.    Activities of Daily Living:  Patient reports morning stiffness for 0 minutes.   Patient Denies nocturnal pain.  Difficulty dressing/grooming: Denies Difficulty climbing stairs: Denies Difficulty getting out of chair: Denies Difficulty using hands for taps, buttons, cutlery, and/or writing: Denies  Review of Systems   Constitutional:  Positive for fatigue.  HENT:  Negative for mouth sores, mouth dryness and nose dryness.   Eyes:  Negative for pain, itching and dryness.  Respiratory:  Negative for shortness of breath and difficulty breathing.   Cardiovascular:  Negative for chest pain and palpitations.  Gastrointestinal:  Negative for blood in stool, constipation and diarrhea.  Endocrine: Negative for increased urination.  Genitourinary:  Negative for difficulty urinating.  Musculoskeletal:  Positive for myalgias and myalgias. Negative for joint pain, joint pain, joint swelling, morning stiffness and muscle tenderness.  Skin:  Negative for color change, rash and redness.  Allergic/Immunologic: Negative for susceptible to infections.  Neurological:  Negative for dizziness, numbness, headaches and memory loss.  Hematological:  Positive for bruising/bleeding tendency.  Psychiatric/Behavioral:  Negative for confusion.    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)    S/P lumpectomy of breast 12/03/2011    Family History  Problem Relation Age of Onset   Breast cancer Mother    Cancer Mother    Kidney cancer Father    Cancer Father    Breast cancer Sister    Cancer Sister    Lung cancer Sister    Colon cancer Maternal Uncle    Liver cancer Paternal Aunt    Past Surgical History:  Procedure Laterality Date   ABDOMINAL HYSTERECTOMY     APPENDECTOMY     BREAST EXCISIONAL BIOPSY Left 1991   benign   BREAST LUMPECTOMY Right 11/2011   BREAST SURGERY     2 left breast biopsies-benign   left breast biopsies  1991 & 1990   Social History   Social History Narrative   Adult Son in the [employer] History  Administered Date(s) Administered   Fluad Quad(high Dose 65+) 08/12/2019   Influenza, High Dose Seasonal PF 09/10/2017, 09/14/2020   Influenza-Unspecified 07/03/2018   PFIZER(Purple Top)SARS-COV-2 Vaccination 01/14/2020, 02/06/2020, 08/05/2020, 04/20/2021   Zoster, Live 11/24/2012     Objective: Vital Signs: BP 121/74 (BP Location: Left Arm, Patient Position: Sitting, Cuff Size: Normal)   Pulse 92   Ht 5' 1.75" (1.568 m)   Wt 134 lb 6.4 oz (61 kg)   BMI 24.78 kg/m    Physical Exam Vitals and nursing note reviewed.  Constitutional:      Appearance: She is well-developed.  HENT:     Head: Normocephalic and atraumatic.  Eyes:     Conjunctiva/sclera: Conjunctivae normal.  Pulmonary:     Effort: Pulmonary effort is normal.  Abdominal:     Palpations: Abdomen is soft.  Musculoskeletal:     Cervical back: Normal range of motion.  Skin:    General: Skin is warm and dry.     Capillary Refill: Capillary refill takes less than 2 seconds.  Neurological:     Mental Status: She is alert and oriented to person, place, and time.  Psychiatric:        Behavior: Behavior normal.     Musculoskeletal Exam: C-spine, thoracic spine, lumbar spine have good range of motion with no discomfort.  Shoulder joints have good  range of motion with some tenderness over the right shoulder.  Elbow joints have good range of motion with no tenderness or inflammation.  Some tenderness over the dorsal aspect of the right wrist but no synovitis was noted.  The left wrist has good range of motion with no tenderness or synovitis.  No tenderness or synovitis over MCP joints.  Complete fist formation bilaterally.  Hip joints have good range of motion with no discomfort.  Both knee joints have good range of motion with no discomfort.  No warmth or effusion of knee joints noted.  Ankle joints have good range of motion with no tenderness or joint swelling.  No tenderness over MTP joints.  PIP and DIP  thickening consistent with OA of both feet noted.   CDAI Exam: CDAI Score: 0.4  Patient Global: 2 mm; Provider Global: 2 mm Swollen: 0 ; Tender: 0  Joint Exam 06/29/2021   No joint exam has been documented for this visit   There is currently no information documented on the homunculus. Go to the Rheumatology activity and complete the homunculus joint exam.  Investigation: No additional findings.  Imaging: No results found.  Recent Labs: Lab Results  Component Value Date   WBC 5.8 06/16/2021   HGB 13.1 06/16/2021   PLT 195 06/16/2021   NA 139 06/16/2021   K 4.2 06/16/2021   CL 102 06/16/2021   CO2 30 06/16/2021   GLUCOSE 90 06/16/2021   BUN 15 06/16/2021   CREATININE 0.76 06/16/2021   BILITOT 0.4 06/16/2021   ALKPHOS 53 06/20/2017   AST 31 06/16/2021   ALT 27 06/16/2021   PROT 6.6 06/16/2021   ALBUMIN 3.8 06/20/2017   CALCIUM 9.7 06/16/2021   GFRAA 90 03/16/2021    Speciality Comments: PLQ Eye Exam: 03/02/2021  normal @ [facility] [place].  Humira-SEs  Procedures:  No procedures performed Allergies: Dilaudid [hydromorphone], Dilaudid [hydromorphone hcl], and Hydrocodone    Assessment / Plan:     Visit Diagnoses: Rheumatoid arthritis: She has no joint tenderness or synovitis on exam.  She has not had  any recent rheumatoid arthritis flares.  She is on Rasuvo 17.5 mg subcutaneous injections once weekly, folic acid 2 mg by mouth daily, and Plaquenil 200 mg 1 tablet by mouth daily.  She has noticed a significant improvement in her symptoms since switching from oral to injectable methotrexate.  She has been tolerating Rasuvo and Plaquenil without any side effects and has not had any injection site reactions.  She has started to increase her stretching and strengthening regimen and has been walking 2 miles on a daily basis for exercise.  She will remain on the current treatment regimen.  She was advised to notify us if she develops increased joint pain or joint swelling.  She will follow-up in the office in 5 months.  High risk medication use - Rasuvo 17.5 mg sq injections every 7 days, folic acid 1 mg 2 tablets daily, and Plaquenil 200 mg 1 tablet by mouth daily. CBC and CMP updated on 06/16/2021.  She will be due to update lab work in October and every 3 months to monitor for drug toxicity.  Standing orders for CBC and CMP are in place.  PLQ Eye Exam: 03/02/2021 normal @ [facility] [place]. She has had 4 covid-19 vaccine doses.  She has not had any recent infections.  Discussed the importance of holding MTX if she develops signs or symptoms of an infection and to resume once the infection has completely cleared.   Primary osteoarthritis of right knee - Moderate osteoarthritis and moderate chondromalacia patella.  MRI of the right knee was performed 08/24/2020 which revealed a small joint effusion and synovitis.  She underwent Orthovisc injections in the right knee on 12/05/2020, 01/09/2021, and 01/16/2021.  Her right knee joint pain has improved but she continues to have occasional discomfort.  She has good range of motion of the right knee on examination today with no warmth or effusion.  She has been walking 2 miles on a daily basis for exercise.  Pain in left ankle and joints of left foot - Resolved.    DDD (degenerative disc disease), lumbar - She has seen Dr. Mina Marble in the  past.  She is not experiencing any increased discomfort at this time.   Chronic right SI joint pain - Resolved   Age-related osteoporosis without current pathological fracture - DEXA ordered by Dr. Criss Rosales on 06/06/2020 AP spine BMD 0.871 with T score -2.5. She received a prolia injection: 05/18/2021.  She continues to take a calcium and vitamin D supplement daily.   Other medical conditions are listed as follows:   History of malignant neoplasm of breast  History of hypertension: BP was 121/74 today.   Positive PPD, treated  Morton's neuroma, unspecified laterality  History of pityriasis rosea  Orders: No orders of the defined types were placed in this encounter.  No orders of the defined types were placed in this encounter.   Follow-Up Instructions: Return in about 5 months (around 11/29/2021) for Rheumatoid arthritis, Osteoarthritis.   Ofilia Neas, PA-C  Note - This record has been created using Dragon software.  Chart creation errors have been sought, but may not always  have been located. Such creation errors do not reflect on  the standard of medical care.

## 2021-06-15 NOTE — Telephone Encounter (Signed)
Lab Orders released.  

## 2021-06-16 DIAGNOSIS — Z79899 Other long term (current) drug therapy: Secondary | ICD-10-CM | POA: Diagnosis not present

## 2021-06-17 LAB — COMPLETE METABOLIC PANEL WITH GFR
AG Ratio: 2 (calc) (ref 1.0–2.5)
ALT: 27 U/L (ref 6–29)
AST: 31 U/L (ref 10–35)
Albumin: 4.4 g/dL (ref 3.6–5.1)
Alkaline phosphatase (APISO): 72 U/L (ref 37–153)
BUN: 15 mg/dL (ref 7–25)
CO2: 30 mmol/L (ref 20–32)
Calcium: 9.7 mg/dL (ref 8.6–10.4)
Chloride: 102 mmol/L (ref 98–110)
Creat: 0.76 mg/dL (ref 0.60–1.00)
Globulin: 2.2 g/dL (calc) (ref 1.9–3.7)
Glucose, Bld: 90 mg/dL (ref 65–139)
Potassium: 4.2 mmol/L (ref 3.5–5.3)
Sodium: 139 mmol/L (ref 135–146)
Total Bilirubin: 0.4 mg/dL (ref 0.2–1.2)
Total Protein: 6.6 g/dL (ref 6.1–8.1)
eGFR: 83 mL/min/{1.73_m2} (ref >=60)

## 2021-06-17 LAB — CBC WITH DIFFERENTIAL/PLATELET
Absolute Monocytes: 528 cells/uL (ref 200–950)
Basophils Absolute: 58 cells/uL (ref 0–200)
Basophils Relative: 1 %
Eosinophils Absolute: 58 cells/uL (ref 15–500)
Eosinophils Relative: 1 %
HCT: 37.7 % (ref 35.0–45.0)
Hemoglobin: 13.1 g/dL (ref 11.7–15.5)
Lymphs Abs: 2146 cells/uL (ref 850–3900)
MCH: 35.5 pg — ABNORMAL HIGH (ref 27.0–33.0)
MCHC: 34.7 g/dL (ref 32.0–36.0)
MCV: 102.2 fL — ABNORMAL HIGH (ref 80.0–100.0)
MPV: 10.5 fL (ref 7.5–12.5)
Monocytes Relative: 9.1 %
Neutro Abs: 3010 cells/uL (ref 1500–7800)
Neutrophils Relative %: 51.9 %
Platelets: 195 10*3/uL (ref 140–400)
RBC: 3.69 10*6/uL — ABNORMAL LOW (ref 3.80–5.10)
RDW: 13.1 % (ref 11.0–15.0)
Total Lymphocyte: 37 %
WBC: 5.8 10*3/uL (ref 3.8–10.8)

## 2021-06-17 NOTE — Progress Notes (Signed)
CMP is normal, CBC is stable.

## 2021-06-29 ENCOUNTER — Encounter: Payer: Self-pay | Admitting: Physician Assistant

## 2021-06-29 ENCOUNTER — Ambulatory Visit: Payer: Medicare PPO | Admitting: Physician Assistant

## 2021-06-29 ENCOUNTER — Other Ambulatory Visit: Payer: Self-pay

## 2021-06-29 VITALS — BP 121/74 | HR 92 | Ht 61.75 in | Wt 134.4 lb

## 2021-06-29 DIAGNOSIS — M810 Age-related osteoporosis without current pathological fracture: Secondary | ICD-10-CM | POA: Diagnosis not present

## 2021-06-29 DIAGNOSIS — M25572 Pain in left ankle and joints of left foot: Secondary | ICD-10-CM

## 2021-06-29 DIAGNOSIS — M533 Sacrococcygeal disorders, not elsewhere classified: Secondary | ICD-10-CM

## 2021-06-29 DIAGNOSIS — Z8679 Personal history of other diseases of the circulatory system: Secondary | ICD-10-CM

## 2021-06-29 DIAGNOSIS — M5136 Other intervertebral disc degeneration, lumbar region: Secondary | ICD-10-CM | POA: Diagnosis not present

## 2021-06-29 DIAGNOSIS — Z853 Personal history of malignant neoplasm of breast: Secondary | ICD-10-CM | POA: Diagnosis not present

## 2021-06-29 DIAGNOSIS — Z79899 Other long term (current) drug therapy: Secondary | ICD-10-CM

## 2021-06-29 DIAGNOSIS — R7611 Nonspecific reaction to tuberculin skin test without active tuberculosis: Secondary | ICD-10-CM

## 2021-06-29 DIAGNOSIS — M0579 Rheumatoid arthritis with rheumatoid factor of multiple sites without organ or systems involvement: Secondary | ICD-10-CM

## 2021-06-29 DIAGNOSIS — M1711 Unilateral primary osteoarthritis, right knee: Secondary | ICD-10-CM

## 2021-06-29 DIAGNOSIS — Z872 Personal history of diseases of the skin and subcutaneous tissue: Secondary | ICD-10-CM

## 2021-06-29 DIAGNOSIS — G8929 Other chronic pain: Secondary | ICD-10-CM

## 2021-06-29 DIAGNOSIS — G5760 Lesion of plantar nerve, unspecified lower limb: Secondary | ICD-10-CM

## 2021-06-29 NOTE — Patient Instructions (Signed)
Standing Labs We placed an order today for your standing lab work.   Please have your standing labs drawn in October and every 3 months   If possible, please have your labs drawn 2 weeks prior to your appointment so that the provider can discuss your results at your appointment.  Please note that you may see your imaging and lab results in MyChart before we have reviewed them. We may be awaiting multiple results to interpret others before contacting you. Please allow our office up to 72 hours to thoroughly review all of the results before contacting the office for clarification of your results.  We have open lab daily: Monday through Thursday from 1:30-4:30 PM and Friday from 1:30-4:00 PM at the office of Dr. Shaili Deveshwar, [facility].   Please be advised, all patients with office appointments requiring lab work will take precedent over walk-in lab work.  If possible, please come for your lab work on Monday and Friday afternoons, as you may experience shorter wait times. The office is located at [address], [address], [place], [place] [address] No appointment is necessary.   Labs are drawn by [facility]. Please bring your co-pay at the time of your lab draw.  You may receive a bill from [facility] for your lab work.  If you wish to have your labs drawn at another location, please call the office 24 hours in advance to send orders.  If you have any questions regarding directions or hours of operation,  please call [phone].   As a reminder, please drink plenty of water prior to coming for your lab work. Thanks!  

## 2021-06-30 ENCOUNTER — Other Ambulatory Visit: Payer: Self-pay | Admitting: Rheumatology

## 2021-06-30 NOTE — Telephone Encounter (Signed)
Patient request refill on Rasuvo sent to [facility] mail order Pharmacy. Patient has enough for her next dose on Tuesday, then she will be out.

## 2021-06-30 NOTE — Telephone Encounter (Signed)
Rasuvo sent to [facility] mail order Pharmacy.  Next Visit: 12/06/2021  Last Visit: 06/29/2021  Last Fill: 04/07/2021  DX:  Rheumatoid arthritis  Current Dose per office note 06/29/2021:  Rasuvo 17.5 mg sq injections every 7 days  Labs: 06/16/2021, CMP is normal, CBC is stable.  Okay to refill Rasuvo?

## 2021-07-03 MED ORDER — RASUVO 17.5 MG/0.35ML ~~LOC~~ SOAJ: 17.5000 mg | SUBCUTANEOUS | 0 refills | Status: DC

## 2021-07-20 DIAGNOSIS — Z803 Family history of malignant neoplasm of breast: Secondary | ICD-10-CM | POA: Diagnosis not present

## 2021-07-20 DIAGNOSIS — Z823 Family history of stroke: Secondary | ICD-10-CM | POA: Diagnosis not present

## 2021-07-20 DIAGNOSIS — M1990 Unspecified osteoarthritis, unspecified site: Secondary | ICD-10-CM | POA: Diagnosis not present

## 2021-07-20 DIAGNOSIS — I10 Essential (primary) hypertension: Secondary | ICD-10-CM | POA: Diagnosis not present

## 2021-07-20 DIAGNOSIS — Z79899 Other long term (current) drug therapy: Secondary | ICD-10-CM | POA: Diagnosis not present

## 2021-07-20 DIAGNOSIS — M810 Age-related osteoporosis without current pathological fracture: Secondary | ICD-10-CM | POA: Diagnosis not present

## 2021-07-20 DIAGNOSIS — G8929 Other chronic pain: Secondary | ICD-10-CM | POA: Diagnosis not present

## 2021-07-20 DIAGNOSIS — M069 Rheumatoid arthritis, unspecified: Secondary | ICD-10-CM | POA: Diagnosis not present

## 2021-07-20 DIAGNOSIS — B009 Herpesviral infection, unspecified: Secondary | ICD-10-CM | POA: Diagnosis not present

## 2021-08-11 ENCOUNTER — Telehealth: Payer: Self-pay | Admitting: *Deleted

## 2021-08-11 ENCOUNTER — Telehealth: Payer: Self-pay

## 2021-08-11 ENCOUNTER — Encounter: Payer: Self-pay | Admitting: Physician Assistant

## 2021-08-11 ENCOUNTER — Ambulatory Visit: Payer: Self-pay

## 2021-08-11 ENCOUNTER — Other Ambulatory Visit: Payer: Self-pay

## 2021-08-11 ENCOUNTER — Ambulatory Visit: Payer: Medicare PPO | Admitting: Physician Assistant

## 2021-08-11 VITALS — BP 122/67 | HR 96 | Resp 15 | Ht 61.75 in | Wt 134.0 lb

## 2021-08-11 DIAGNOSIS — R7611 Nonspecific reaction to tuberculin skin test without active tuberculosis: Secondary | ICD-10-CM

## 2021-08-11 DIAGNOSIS — G8929 Other chronic pain: Secondary | ICD-10-CM

## 2021-08-11 DIAGNOSIS — M25562 Pain in left knee: Secondary | ICD-10-CM | POA: Diagnosis not present

## 2021-08-11 DIAGNOSIS — Z872 Personal history of diseases of the skin and subcutaneous tissue: Secondary | ICD-10-CM

## 2021-08-11 DIAGNOSIS — M1711 Unilateral primary osteoarthritis, right knee: Secondary | ICD-10-CM | POA: Diagnosis not present

## 2021-08-11 DIAGNOSIS — Z853 Personal history of malignant neoplasm of breast: Secondary | ICD-10-CM

## 2021-08-11 DIAGNOSIS — M533 Sacrococcygeal disorders, not elsewhere classified: Secondary | ICD-10-CM | POA: Diagnosis not present

## 2021-08-11 DIAGNOSIS — Z8679 Personal history of other diseases of the circulatory system: Secondary | ICD-10-CM | POA: Diagnosis not present

## 2021-08-11 DIAGNOSIS — G5760 Lesion of plantar nerve, unspecified lower limb: Secondary | ICD-10-CM

## 2021-08-11 DIAGNOSIS — Z79899 Other long term (current) drug therapy: Secondary | ICD-10-CM | POA: Diagnosis not present

## 2021-08-11 DIAGNOSIS — M5136 Other intervertebral disc degeneration, lumbar region: Secondary | ICD-10-CM

## 2021-08-11 DIAGNOSIS — M0579 Rheumatoid arthritis with rheumatoid factor of multiple sites without organ or systems involvement: Secondary | ICD-10-CM

## 2021-08-11 DIAGNOSIS — M810 Age-related osteoporosis without current pathological fracture: Secondary | ICD-10-CM | POA: Diagnosis not present

## 2021-08-11 MED ORDER — PREDNISONE 5 MG PO TABS: ORAL_TABLET | ORAL | 0 refills | Status: DC

## 2021-08-11 NOTE — Patient Instructions (Signed)
Standing Labs We placed an order today for your standing lab work.   Please have your standing labs drawn in October and every 3 months   If possible, please have your labs drawn 2 weeks prior to your appointment so that the provider can discuss your results at your appointment.  Please note that you may see your imaging and lab results in MyChart before we have reviewed them. We may be awaiting multiple results to interpret others before contacting you. Please allow our office up to 72 hours to thoroughly review all of the results before contacting the office for clarification of your results.  We have open lab daily: Monday through Thursday from 1:30-4:30 PM and Friday from 1:30-4:00 PM at the office of Dr. Shaili Deveshwar, [facility].   Please be advised, all patients with office appointments requiring lab work will take precedent over walk-in lab work.  If possible, please come for your lab work on Monday and Friday afternoons, as you may experience shorter wait times. The office is located at [address], [address], [place], [place] [address] No appointment is necessary.   Labs are drawn by [facility]. Please bring your co-pay at the time of your lab draw.  You may receive a bill from [facility] for your lab work.  If you wish to have your labs drawn at another location, please call the office 24 hours in advance to send orders.  If you have any questions regarding directions or hours of operation,  please call [phone].   As a reminder, please drink plenty of water prior to coming for your lab work. Thanks!  

## 2021-08-11 NOTE — Progress Notes (Signed)
Office Visit Note  Patient: Melissa Perez             Date of Birth: 08-23-1946           MRN: [id number]             PCP: Lucianne Lei, MD Referring: Lucianne Lei, MD Visit Date: 08/11/2021 Occupation: '@GUAROCC'$ @  Subjective:  Pain in multiple joints   History of Present Illness: Melissa Perez is a 75 y.o. female with history of rheumatoid arthritis, osteoarthritis, and DDD.  Patient is currently taking Plaquenil 200 mg 1 tablet by mouth daily, methotrexate 17.5 mg sq injections once weekly, and folic acid 2 mg daily.  She has not missed any doses recently.  Patient reports that last week she started to have increased pain in the right knee joint.  She states that she use Voltaren gel as well as rest, and ice which alleviated her discomfort.  She continues to have a dull ache in her right knee but states that 2 days ago she started to have severe pain in the left knee joint.  She states that she woke up this morning and was having difficulty bearing weight due to severity of pain in her left knee.  She is also been having increased pain and intermittent swelling in her right wrist.  She states that she has been performing yard work as well as cooking more frequently which may have exacerbated her discomfort.  She is currently wearing a right wrist brace.  Last night she took Motrin for pain relief which alleviated some of her discomfort.  She states that overall her shoulder joint pain has improved significantly.  She denies any other joint pain or joint swelling at this time. She remains on Prolia every 6 months by her PCP.  She is due to update her bone density. She denies any recent infections.     Activities of Daily Living:  Patient reports morning stiffness for 0  NONE .   Patient Reports nocturnal pain.  Difficulty dressing/grooming: Reports Difficulty climbing stairs: Denies Difficulty getting out of chair: Reports Difficulty using hands for taps, buttons, cutlery, and/or writing:  Reports  Review of Systems  Constitutional:  Positive for fatigue.  HENT:  Negative for mouth dryness.   Eyes:  Negative for dryness.  Respiratory:  Negative for shortness of breath.   Cardiovascular:  Negative for swelling in legs/feet.  Gastrointestinal:  Negative for constipation.  Endocrine: Positive for cold intolerance, heat intolerance and increased urination.  Genitourinary:  Negative for difficulty urinating.  Musculoskeletal:  Positive for joint pain, gait problem, joint pain, joint swelling, muscle weakness, morning stiffness and muscle tenderness.  Skin:  Negative for rash.  Allergic/Immunologic: Negative for susceptible to infections.  Neurological:  Positive for weakness.  Hematological:  Positive for bruising/bleeding tendency.  Psychiatric/Behavioral:  Positive for sleep disturbance.    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   S/P lumpectomy of breast 12/03/2011   Primary cancer of  lower outer quadrant of right female breast ([place]) 11/01/2011    Past Medical History:  Diagnosis Date   Arthritis    rheumatoid, take Humira   Arthritis    rheumatoid   Arthritis pain    Back pain    Breast cancer ([place])    Cancer ([place])    History of dental surgery    Hyperlipidemia    Hypertension    Personal history of radiation therapy    Rash    Rheumatoid arthritis ([place])    S/P lumpectomy of breast 12/03/2011    Family History  Problem Relation Age of  Onset   Breast cancer Mother    Cancer Mother    Kidney cancer Father    Cancer Father    Breast cancer Sister    Cancer Sister    Lung cancer Sister    Colon cancer Maternal Uncle    Liver cancer Paternal Aunt    Past Surgical History:  Procedure Laterality Date   ABDOMINAL HYSTERECTOMY     APPENDECTOMY     BREAST EXCISIONAL BIOPSY Left 1991   benign   BREAST LUMPECTOMY Right 11/2011   BREAST SURGERY     2 left breast biopsies-benign   left breast biopsies  1991 & 1990   Social History   Social History Narrative   Adult Son in the [employer] History  Administered Date(s) Administered   Fluad Quad(high Dose 65+) 08/12/2019   Influenza, High Dose Seasonal PF 09/10/2017, 09/14/2020   Influenza-Unspecified 07/03/2018   PFIZER(Purple Top)SARS-COV-2 Vaccination 01/14/2020, 02/06/2020, 08/05/2020, 04/20/2021   Zoster, Live 11/24/2012     Objective: Vital Signs: BP 122/67 (BP Location: Left Arm, Patient Position: Sitting, Cuff Size: Normal)   Pulse 96   Resp 15   Ht 5' 1.75" (1.568 m)   Wt 134 lb (60.8 kg)   BMI 24.71 kg/m    Physical Exam Vitals and nursing note reviewed.  Constitutional:      Appearance: She is well-developed.  HENT:     Head: Normocephalic and atraumatic.  Eyes:     Conjunctiva/sclera: Conjunctivae normal.  Pulmonary:     Effort: Pulmonary effort is normal.  Abdominal:     Palpations: Abdomen is soft.  Musculoskeletal:     Cervical back: Normal range of motion.  Skin:    General: Skin is warm and dry.     Capillary Refill: Capillary refill takes less than 2 seconds.  Neurological:     Mental Status: She is alert and oriented to person, place, and time.  Psychiatric:        Behavior: Behavior normal.     Musculoskeletal Exam: C-spine, thoracic spine, and lumbar spine good ROM. Shoulder joints have good ROM with no discomfort.  Elbow joints good ROM with no tenderness and inflammation.  Limited and painful ROM of the right wrist.   Tenderness over dorsal aspect of right wrist.  No tenderness or synovitis of MCP joints.  Complete fist formation bilaterally.  Hip joints have good ROM with no discomfort.  Painful ROM of both knee joints.  No effusion of knee joints noted.  Ankle joints have good ROM with no tenderness or joint swelling.   CDAI Exam: CDAI Score: 4.2  Patient Global: 6 mm; Provider Global: 6 mm Swollen: 0 ; Tender: 3  Joint Exam 08/11/2021      Right  Left  Wrist   Tender     Knee   Tender   Tender     Investigation:  No additional findings.  Imaging: No results found.  Recent Labs: Lab Results  Component Value Date   WBC 5.8 06/16/2021   HGB 13.1 06/16/2021   PLT 195 06/16/2021   NA 139 06/16/2021   K 4.2 06/16/2021   CL 102 06/16/2021   CO2 30 06/16/2021   GLUCOSE 90 06/16/2021   BUN 15 06/16/2021   CREATININE 0.76 06/16/2021   BILITOT 0.4 06/16/2021   ALKPHOS 53 06/20/2017   AST 31 06/16/2021   ALT 27 06/16/2021   PROT 6.6 06/16/2021   ALBUMIN 3.8 06/20/2017   CALCIUM 9.7 06/16/2021   GFRAA 90 03/16/2021    Speciality Comments: PLQ Eye Exam: 03/02/2021  normal @ [facility] [place].  Humira-SEs  Procedures:  No procedures performed Allergies: Dilaudid [hydromorphone], Dilaudid [hydromorphone hcl], and Hydrocodone      Assessment / Plan:     Visit Diagnoses: Rheumatoid arthritis: She presents today with symptoms consistent with a rheumatoid arthritis flare.  She started to experience right knee joint pain about 1 week ago which has gradually improved with the use of Voltaren gel and ice.  About 2 days ago she started to experience increased pain in the left knee joint and woke up this morning having difficulty ambulating due to the severity of pain.  She is also having pain and intermittent inflammation in the right wrist which she attributes to performing yard work as well as cooking more frequently.  She is currently wearing a right wrist brace for support.  She  remains on Plaquenil 200 mg 1 tablet by mouth daily, Rasuvo 17.5 mg subcutaneous injections once weekly, and folic acid 2 mg by mouth daily.  She has not missed any doses of these medications recently.  A prednisone taper starting at 20 mg tapering by 5 mg every 4 days was sent to the pharmacy.  We will also apply for Visco gel injections for both knee joints.  Her next routine lab work will be due in October so future order for a hydroxychloroquine drug level will be placed today to be drawn at that time to ensure she is within the therapeutic range.  She was advised to notify us if she continues to have recurrent flares.  She will remain on combination therapy as prescribed.  She will follow-up in the office in 3 months.  High risk medication use -Plaquenil 200 mg 1 tablet by mouth daily, Rasuvo 17.5 mg sq injections once weekly, and folic acid 2 mg by mouth daily.  CBC and CMP were updated on 06/16/2021.  Her next lab work will be due in October and every 3 months to monitor for drug toxicity.  Standing orders for CBC and CMP remain in place.  She has a Plaquenil eye exam scheduled at the end of this month.  She was given a Plaquenil eye exam form to take with her to her upcoming appointment.  A hydroxychloroquine blood level will be checked with her next lab work.  A future order will be placed today. Plan: CBC with Differential/Platelet, COMPLETE METABOLIC PANEL WITH GFR She has not had any recent infections.  We discussed the importance of holding methotrexate if she develops signs or symptoms of an infection and to resume once the infection is completely cleared.  Primary osteoarthritis of right knee: Moderate osteoarthritis and moderate chondromalacia patella.  MRI of right knee performed on 08/24/2020 revealed small joint effusion and synovitis.  She underwent Orthovisc injections in the right knee on 12/05/2020, 01/09/2021, and 01/16/2021.  She noticed significant improvement in her right knee joint pain  after completing the series.  About 1 week ago she started to have increased pain in her right knee.  She is Voltaren gel, ice, and rest and her symptoms have gradually started to improve.  She continues to experience a dull ache in the right knee.  On examination today she had painful range of motion but no warmth or effusion was noted.  She would like to reapply for Visco gel injections for the right knee.  Chronic pain of left knee - She presents today with increased pain in the left knee joint, which started 2 days ago. She woke up this morning and was having difficulty bearing weight due to the severity of pain in her left knee.  She has been noticing intermittent swelling over the past 2 days.  She has been using Voltaren gel topically as well as applying ice for pain relief.  X-rays of the left knee joint were updated today.  She would like to apply for Visco gel injections for both knees since she noticed so much improvement in the right knee after completing the series in January/February 2022.  A prednisone taper starting at 20 mg tapering by 5 mg every 4 days were also be sent to the pharmacy today to treat the acute flare.  Plan: XR KNEE 3 VIEW LEFT  DDD (degenerative disc disease), lumbar: Followed by Dr. Mina Marble. She is not having any lower back discomfort at this time.    Chronic right SI joint pain: Resolved   Age-related osteoporosis without current pathological fracture - DEXA ordered by Dr. Criss Rosales on 06/06/2020 AP spine BMD 0.871 with T score -2.5. She received a prolia injection: 05/18/2021.  She continues to take a calcium and vitamin D supplement on a daily basis.  Her next bone density will be due in July 2023.  Plan: DG BONE DENSITY (DXA)  Other medical conditions are listed as follows:   History of malignant neoplasm of breast  History of hypertension: BP was 122/67 today in the office.   Positive PPD, treated  Morton's neuroma, unspecified laterality  History of pityriasis  rosea   Orders: Orders Placed This Encounter  Procedures   XR KNEE 3 VIEW LEFT   DG BONE DENSITY (DXA)   CBC with Differential/Platelet   COMPLETE METABOLIC PANEL WITH GFR   Hydroxychloroquine, Blood   Meds ordered this encounter  Medications   predniSONE (DELTASONE) 5 MG tablet    Sig: Take 4 tablets by mouth daily x4 days, 3 tablets daily x4 days, 2 tablets daily x4 days, 1 tablet daily x4 days.    Dispense:  40 tablet    Refill:  0     Follow-Up Instructions: Return in about 3 months (around 11/10/2021) for Rheumatoid arthritis, Osteoarthritis, DDD.   Ofilia Neas, PA-C  Note - This record has been created using Dragon software.  Chart creation errors have been sought, but may not always  have been located. Such creation errors do not reflect on  the standard of medical care.

## 2021-08-11 NOTE — Telephone Encounter (Signed)
Patient contacted the office stating she had been in her right knee about 10 days ago. She took Tylenol and iced it and it subsided. Patient states she is now having pain in her left knee and can hardly bare weight. Patient has been scheduled an appointment for evaluation today 08/11/2021 at 10:20 am.

## 2021-08-11 NOTE — Progress Notes (Signed)
Please call the patient to review x-ray results: Mild OA and mild chondromalacia patella.

## 2021-08-11 NOTE — Telephone Encounter (Signed)
Please apply for bilateral knee visco, per Taylor Dale, PA-C. Thanks!  

## 2021-08-16 NOTE — Telephone Encounter (Signed)
Submitted for VOB 08/14/2021.

## 2021-08-18 NOTE — Telephone Encounter (Signed)
Please call patient to schedule Visco knee injections.

## 2021-08-22 NOTE — Telephone Encounter (Signed)
Please call patient to schedule Visco knee injections.  Authorized for [employer] series Bilateral knees. Buy and Bill. Deductible does not apply. PA obtained thru [employer]. Authorization # [id number], Effective 08/14/2021-12/01/2021. Insurance to cover 100% of allowable administration, and medication cost with a $35.00 copay each visit.

## 2021-08-23 NOTE — Telephone Encounter (Signed)
I LMOM for patient to call to receive benefits, and schedule Visco knee injections.

## 2021-08-28 ENCOUNTER — Other Ambulatory Visit: Payer: Self-pay | Admitting: Physician Assistant

## 2021-08-28 ENCOUNTER — Ambulatory Visit: Payer: Medicare PPO | Admitting: Physician Assistant

## 2021-08-28 ENCOUNTER — Other Ambulatory Visit: Payer: Self-pay

## 2021-08-28 DIAGNOSIS — M170 Bilateral primary osteoarthritis of knee: Secondary | ICD-10-CM

## 2021-08-28 MED ORDER — HYALURONAN 30 MG/2ML IX SOSY
30.0000 mg | PREFILLED_SYRINGE | INTRA_ARTICULAR | Status: AC | PRN
  Administered 2021-08-28: 30 mg via INTRA_ARTICULAR

## 2021-08-28 MED ORDER — LIDOCAINE HCL 1 % IJ SOLN
1.5000 mL | INTRAMUSCULAR | Status: AC | PRN
  Administered 2021-08-28: 1.5 mL via INTRA_ARTICULAR

## 2021-08-28 NOTE — Progress Notes (Signed)
   Procedure Note  Patient: [name]             Date of Birth: 28-Jun-1946           MRN: [id number]             Visit Date: 08/28/2021  Procedures: Visit Diagnoses:  1. Primary osteoarthritis of both knees    Orthovisc #1 bilateral knees, B/B Large Joint Inj: bilateral knee on 08/28/2021 9:49 AM Indications: pain Details: 25 G 1.5 in needle, medial approach  Arthrogram: No  Medications (Right): 1.5 mL lidocaine 1 %; 30 mg Hyaluronan 30 MG/2ML Aspirate (Right): 0 mL Medications (Left): 1.5 mL lidocaine 1 %; 30 mg Hyaluronan 30 MG/2ML Aspirate (Left): 0 mL Outcome: tolerated well, no immediate complications Procedure, treatment alternatives, risks and benefits explained, specific risks discussed. Consent was given by the patient. Immediately prior to procedure a time out was called to verify the correct patient, procedure, equipment, support staff and site/side marked as required. Patient was prepped and draped in the usual sterile fashion.    Patient tolerated the procedure well.  Aftercare was discussed.  Hazel Sams, PA-C

## 2021-08-28 NOTE — Telephone Encounter (Signed)
Next Visit: 12/06/2021  Last Visit: 08/11/2021  Last Fill: 10/05/2020  Dx: Rheumatoid arthritis  Current Dose per office note on 08/08/7288: folic acid 2 mg by mouth daily  Okay to refill Folic Acid?

## 2021-08-31 DIAGNOSIS — H43813 Vitreous degeneration, bilateral: Secondary | ICD-10-CM | POA: Diagnosis not present

## 2021-08-31 DIAGNOSIS — H2513 Age-related nuclear cataract, bilateral: Secondary | ICD-10-CM | POA: Diagnosis not present

## 2021-08-31 DIAGNOSIS — Z79899 Other long term (current) drug therapy: Secondary | ICD-10-CM | POA: Diagnosis not present

## 2021-08-31 DIAGNOSIS — M0570 Rheumatoid arthritis with rheumatoid factor of unspecified site without organ or systems involvement: Secondary | ICD-10-CM | POA: Diagnosis not present

## 2021-09-04 ENCOUNTER — Ambulatory Visit: Payer: Medicare PPO | Admitting: Physician Assistant

## 2021-09-04 ENCOUNTER — Other Ambulatory Visit: Payer: Self-pay

## 2021-09-04 DIAGNOSIS — M170 Bilateral primary osteoarthritis of knee: Secondary | ICD-10-CM | POA: Diagnosis not present

## 2021-09-04 DIAGNOSIS — M0579 Rheumatoid arthritis with rheumatoid factor of multiple sites without organ or systems involvement: Secondary | ICD-10-CM

## 2021-09-04 MED ORDER — LIDOCAINE HCL 1 % IJ SOLN
1.5000 mL | INTRAMUSCULAR | Status: AC | PRN
  Administered 2021-09-04: 1.5 mL via INTRA_ARTICULAR

## 2021-09-04 MED ORDER — HYDROXYCHLOROQUINE SULFATE 200 MG PO TABS: 200.0000 mg | ORAL_TABLET | Freq: Every day | ORAL | 0 refills | Status: DC

## 2021-09-04 MED ORDER — HYALURONAN 30 MG/2ML IX SOSY
30.0000 mg | PREFILLED_SYRINGE | INTRA_ARTICULAR | Status: AC | PRN
  Administered 2021-09-04: 30 mg via INTRA_ARTICULAR

## 2021-09-04 MED ORDER — METHOTREXATE 2.5 MG PO TABS: ORAL_TABLET | ORAL | 0 refills | Status: DC

## 2021-09-04 NOTE — Telephone Encounter (Signed)
Oral methotrexate and plaquenil refills that you discussed with patient today. Please review and send to the pharmacy. Thanks!

## 2021-09-04 NOTE — Progress Notes (Signed)
   Procedure Note  Patient: Melissa Perez             Date of Birth: [birth year]           MRN: [id number]             Visit Date: 09/04/2021  Procedures: Visit Diagnoses:  1. Primary osteoarthritis of both knees    Orthovisc #2 bilateral knees, B/B Large Joint Inj: bilateral knee on 09/04/2021 11:49 AM Indications: pain Details: 25 G 1.5 in needle, medial approach  Arthrogram: No  Medications (Right): 1.5 mL lidocaine 1 %; 30 mg Hyaluronan 30 MG/2ML Aspirate (Right): 0 mL Medications (Left): 1.5 mL lidocaine 1 %; 30 mg Hyaluronan 30 MG/2ML Aspirate (Left): 0 mL Outcome: tolerated well, no immediate complications Procedure, treatment alternatives, risks and benefits explained, specific risks discussed. Consent was given by the patient. Immediately prior to procedure a time out was called to verify the correct patient, procedure, equipment, support staff and site/side marked as required. Patient was prepped and draped in the usual sterile fashion.     Patient tolerated the procedure well.  Aftercare was discussed.  Patient requested a refill of PLQ.  She also requested to switch from injectable to oral MTX due to increased hair loss.  Discussed the use of biotin.  A new prescription will be sent to the pharmacy.   Hazel Sams, PA-C

## 2021-09-05 ENCOUNTER — Other Ambulatory Visit (HOSPITAL_COMMUNITY): Payer: Self-pay | Admitting: Family Medicine

## 2021-09-05 ENCOUNTER — Other Ambulatory Visit: Payer: Self-pay | Admitting: Family Medicine

## 2021-09-05 DIAGNOSIS — E785 Hyperlipidemia, unspecified: Secondary | ICD-10-CM | POA: Diagnosis not present

## 2021-09-05 DIAGNOSIS — K2100 Gastro-esophageal reflux disease with esophagitis, without bleeding: Secondary | ICD-10-CM

## 2021-09-05 DIAGNOSIS — Z6824 Body mass index (BMI) 24.0-24.9, adult: Secondary | ICD-10-CM | POA: Diagnosis not present

## 2021-09-05 DIAGNOSIS — R7301 Impaired fasting glucose: Secondary | ICD-10-CM | POA: Diagnosis not present

## 2021-09-05 DIAGNOSIS — I10 Essential (primary) hypertension: Secondary | ICD-10-CM | POA: Diagnosis not present

## 2021-09-05 DIAGNOSIS — E669 Obesity, unspecified: Secondary | ICD-10-CM | POA: Diagnosis not present

## 2021-09-05 DIAGNOSIS — M064 Inflammatory polyarthropathy: Secondary | ICD-10-CM | POA: Diagnosis not present

## 2021-09-12 ENCOUNTER — Other Ambulatory Visit: Payer: Self-pay

## 2021-09-12 ENCOUNTER — Telehealth: Payer: Self-pay | Admitting: Rheumatology

## 2021-09-12 ENCOUNTER — Ambulatory Visit: Payer: Medicare PPO | Admitting: Physician Assistant

## 2021-09-12 DIAGNOSIS — M0579 Rheumatoid arthritis with rheumatoid factor of multiple sites without organ or systems involvement: Secondary | ICD-10-CM

## 2021-09-12 DIAGNOSIS — M170 Bilateral primary osteoarthritis of knee: Secondary | ICD-10-CM | POA: Diagnosis not present

## 2021-09-12 DIAGNOSIS — Z79899 Other long term (current) drug therapy: Secondary | ICD-10-CM

## 2021-09-12 MED ORDER — HYALURONAN 30 MG/2ML IX SOSY
30.0000 mg | PREFILLED_SYRINGE | INTRA_ARTICULAR | Status: AC | PRN
  Administered 2021-09-12: 30 mg via INTRA_ARTICULAR

## 2021-09-12 MED ORDER — LIDOCAINE HCL 1 % IJ SOLN
1.5000 mL | INTRAMUSCULAR | Status: AC | PRN
  Administered 2021-09-12: 1.5 mL via INTRA_ARTICULAR

## 2021-09-12 NOTE — Progress Notes (Signed)
   Procedure Note  Patient: [name]             Date of Birth: 01-03-1946           MRN: [id number]             Visit Date: 09/12/2021  Procedures: Visit Diagnoses:  1. Primary osteoarthritis of both knees    Orthovisc #3 bilateral knees, B/B Large Joint Inj: bilateral knee on 09/12/2021 11:20 AM Indications: pain Details: 25 G 1.5 in needle, medial approach  Arthrogram: No  Medications (Right): 1.5 mL lidocaine 1 %; 30 mg Hyaluronan 30 MG/2ML Aspirate (Right): 0 mL Medications (Left): 1.5 mL lidocaine 1 %; 30 mg Hyaluronan 30 MG/2ML Aspirate (Left): 0 mL Outcome: tolerated well, no immediate complications Procedure, treatment alternatives, risks and benefits explained, specific risks discussed. Consent was given by the patient. Immediately prior to procedure a time out was called to verify the correct patient, procedure, equipment, support staff and site/side marked as required. Patient was prepped and draped in the usual sterile fashion.    Patient tolerated the procedure well.  Aftercare was discussed.  Hazel Sams, PA-C

## 2021-09-12 NOTE — Telephone Encounter (Signed)
Lab orders have been released for [facility]. I attempted to contact patient and left message on machine to advise patient.

## 2021-09-12 NOTE — Telephone Encounter (Signed)
Patient will be going to [facility] on Thursday 09/14/21 for lab draw. Please release orders.

## 2021-09-13 ENCOUNTER — Ambulatory Visit (HOSPITAL_COMMUNITY)
Admission: RE | Admit: 2021-09-13 | Discharge: 2021-09-13 | Disposition: A | Discharge status: Home / Self Care | Payer: Medicare PPO | Source: Ambulatory Visit | Attending: Family Medicine | Admitting: Family Medicine

## 2021-09-13 DIAGNOSIS — K219 Gastro-esophageal reflux disease without esophagitis: Secondary | ICD-10-CM | POA: Diagnosis not present

## 2021-09-13 DIAGNOSIS — K2100 Gastro-esophageal reflux disease with esophagitis, without bleeding: Secondary | ICD-10-CM | POA: Diagnosis not present

## 2021-09-13 DIAGNOSIS — K209 Esophagitis, unspecified without bleeding: Secondary | ICD-10-CM | POA: Diagnosis not present

## 2021-09-14 DIAGNOSIS — M0579 Rheumatoid arthritis with rheumatoid factor of multiple sites without organ or systems involvement: Secondary | ICD-10-CM | POA: Diagnosis not present

## 2021-09-14 DIAGNOSIS — Z79899 Other long term (current) drug therapy: Secondary | ICD-10-CM | POA: Diagnosis not present

## 2021-09-15 ENCOUNTER — Telehealth: Payer: Self-pay | Admitting: *Deleted

## 2021-09-15 DIAGNOSIS — Z79899 Other long term (current) drug therapy: Secondary | ICD-10-CM

## 2021-09-15 NOTE — Telephone Encounter (Signed)
-----   Message from Ofilia Neas, PA-C sent at 09/15/2021  7:55 AM EDT ----- Glucose is 112.  Rest of CMP WNL.  CBC stable.  Please make sure the patient is taking folic acid 2 mg daily.  Please place future orders for folate and vitamin B12 to be checked with next lab work.

## 2021-09-15 NOTE — Progress Notes (Signed)
Glucose is 112.  Rest of CMP WNL.  CBC stable.  Please make sure the patient is taking folic acid 2 mg daily.  Please place future orders for folate and vitamin B12 to be checked with next lab work.

## 2021-09-22 DIAGNOSIS — I7389 Other specified peripheral vascular diseases: Secondary | ICD-10-CM | POA: Diagnosis not present

## 2021-09-22 DIAGNOSIS — K219 Gastro-esophageal reflux disease without esophagitis: Secondary | ICD-10-CM | POA: Diagnosis not present

## 2021-09-22 DIAGNOSIS — I10 Essential (primary) hypertension: Secondary | ICD-10-CM | POA: Diagnosis not present

## 2021-09-22 DIAGNOSIS — E785 Hyperlipidemia, unspecified: Secondary | ICD-10-CM | POA: Diagnosis not present

## 2021-09-22 DIAGNOSIS — M940 Chondrocostal junction syndrome [Tietze]: Secondary | ICD-10-CM | POA: Diagnosis not present

## 2021-09-22 LAB — COMPLETE METABOLIC PANEL WITH GFR
AG Ratio: 1.8 (calc) (ref 1.0–2.5)
ALT: 18 U/L (ref 6–29)
AST: 27 U/L (ref 10–35)
Albumin: 4.2 g/dL (ref 3.6–5.1)
Alkaline phosphatase (APISO): 68 U/L (ref 37–153)
BUN: 14 mg/dL (ref 7–25)
CO2: 29 mmol/L (ref 20–32)
Calcium: 9.1 mg/dL (ref 8.6–10.4)
Chloride: 102 mmol/L (ref 98–110)
Creat: 0.75 mg/dL (ref 0.60–1.00)
Globulin: 2.3 g/dL (calc) (ref 1.9–3.7)
Glucose, Bld: 112 mg/dL — ABNORMAL HIGH (ref 65–99)
Potassium: 4 mmol/L (ref 3.5–5.3)
Sodium: 139 mmol/L (ref 135–146)
Total Bilirubin: 0.4 mg/dL (ref 0.2–1.2)
Total Protein: 6.5 g/dL (ref 6.1–8.1)
eGFR: 84 mL/min/{1.73_m2} (ref >=60)

## 2021-09-22 LAB — CBC WITH DIFFERENTIAL/PLATELET
Absolute Monocytes: 474 cells/uL (ref 200–950)
Basophils Absolute: 60 cells/uL (ref 0–200)
Basophils Relative: 1.3 %
Eosinophils Absolute: 230 cells/uL (ref 15–500)
Eosinophils Relative: 5 %
HCT: 36.3 % (ref 35.0–45.0)
Hemoglobin: 12.4 g/dL (ref 11.7–15.5)
Lymphs Abs: 1357 cells/uL (ref 850–3900)
MCH: 35.3 pg — ABNORMAL HIGH (ref 27.0–33.0)
MCHC: 34.2 g/dL (ref 32.0–36.0)
MCV: 103.4 fL — ABNORMAL HIGH (ref 80.0–100.0)
MPV: 9.9 fL (ref 7.5–12.5)
Monocytes Relative: 10.3 %
Neutro Abs: 2479 cells/uL (ref 1500–7800)
Neutrophils Relative %: 53.9 %
Platelets: 228 10*3/uL (ref 140–400)
RBC: 3.51 10*6/uL — ABNORMAL LOW (ref 3.80–5.10)
RDW: 13.3 % (ref 11.0–15.0)
Total Lymphocyte: 29.5 %
WBC: 4.6 10*3/uL (ref 3.8–10.8)

## 2021-09-22 LAB — HYDROXYCHLOROQUINE,BLOOD: HYDROXYCHLOROQUINE, (B): 660 ng/mL — ABNORMAL HIGH

## 2021-09-22 NOTE — Progress Notes (Signed)
Hydroxychloroquine level is 660.  Greater than 1200 is considered within the toxicity range so she is within a safe range but may benefit from increasing the dose since she continues to experience intermittent flares.  Discussed with Dr. Estanislado Pandy and she recommended increasing dose of Plaquenil to 200 mg 1 tablet by mouth twice daily Monday through Friday.  Recheck hydroxychloroquine level in 3 months.  Please advise the patient to have level checked at least 4 hours after her Plaquenil dose.

## 2021-09-25 ENCOUNTER — Other Ambulatory Visit: Payer: Self-pay | Admitting: *Deleted

## 2021-09-25 DIAGNOSIS — M0579 Rheumatoid arthritis with rheumatoid factor of multiple sites without organ or systems involvement: Secondary | ICD-10-CM

## 2021-09-25 DIAGNOSIS — Z79899 Other long term (current) drug therapy: Secondary | ICD-10-CM

## 2021-09-25 MED ORDER — HYDROXYCHLOROQUINE SULFATE 200 MG PO TABS: ORAL_TABLET | ORAL | 0 refills | Status: DC

## 2021-09-25 NOTE — Telephone Encounter (Signed)
-----   Message from Ofilia Neas, PA-C sent at 09/22/2021  3:48 PM EDT ----- Hydroxychloroquine level is 660.  Greater than 1200 is considered within the toxicity range so she is within a safe range but may benefit from increasing the dose since she continues to experience intermittent flares.  Discussed with Dr. Estanislado Pandy an d she recommended increasing dose of Plaquenil to 200 mg 1 tablet by mouth twice daily Monday through Friday.  Recheck hydroxychloroquine level in 3 months.  Please advise the patient to have level checked at least 4 hours after her Plaquenil dose.

## 2021-10-23 DIAGNOSIS — E785 Hyperlipidemia, unspecified: Secondary | ICD-10-CM | POA: Diagnosis not present

## 2021-10-23 DIAGNOSIS — M940 Chondrocostal junction syndrome [Tietze]: Secondary | ICD-10-CM | POA: Diagnosis not present

## 2021-10-23 DIAGNOSIS — I10 Essential (primary) hypertension: Secondary | ICD-10-CM | POA: Diagnosis not present

## 2021-10-23 DIAGNOSIS — M0579 Rheumatoid arthritis with rheumatoid factor of multiple sites without organ or systems involvement: Secondary | ICD-10-CM | POA: Diagnosis not present

## 2021-10-23 DIAGNOSIS — M069 Rheumatoid arthritis, unspecified: Secondary | ICD-10-CM | POA: Diagnosis not present

## 2021-11-04 ENCOUNTER — Other Ambulatory Visit: Payer: Self-pay | Admitting: Physician Assistant

## 2021-11-22 NOTE — Progress Notes (Signed)
Office Visit Note  Patient: Melissa Perez             Date of Birth: 1946-03-07           MRN: [id number]             PCP: Lucianne Lei, MD Referring: Lucianne Lei, MD Visit Date: 12/06/2021 Occupation: @GUAROCC @  Subjective:  Medication management   History of Present Illness: HARUE PRIBBLE is a 75 y.o. female with a history of rheumatoid arthritis, osteoarthritis, degenerative disc disease and osteoporosis.  She has been taking methotrexate 7 tablets p.o. weekly along with hydroxychloroquine.  She denies any increased joint swelling.  She states that she has been taking down Christmas decorations which is causing some discomfort in her wrists and her shoulders.  She continues to have some discomfort in her lower back.  She has been getting Prolia injections through her endocrinologist.  Activities of Daily Living:  Patient reports morning stiffness for 1 minutes.   Patient Denies nocturnal pain.  Difficulty dressing/grooming: Denies Difficulty climbing stairs: Denies Difficulty getting out of chair: Denies Difficulty using hands for taps, buttons, cutlery, and/or writing: Denies  Review of Systems  Constitutional:  Positive for fatigue.  HENT:  Negative for mouth sores, mouth dryness and nose dryness.   Eyes:  Negative for pain, itching and dryness.  Respiratory:  Negative for shortness of breath and difficulty breathing.   Cardiovascular:  Negative for chest pain and palpitations.  Gastrointestinal:  Negative for blood in stool, constipation and diarrhea.  Endocrine: Negative for increased urination.  Genitourinary:  Negative for difficulty urinating.  Musculoskeletal:  Positive for joint pain, joint pain, myalgias, muscle tenderness and myalgias. Negative for joint swelling and morning stiffness.  Skin:  Negative for color change, rash and redness.  Allergic/Immunologic: Negative for susceptible to infections.  Neurological:  Positive for weakness. Negative for dizziness,  numbness, headaches and memory loss.  Hematological:  Positive for bruising/bleeding tendency.  Psychiatric/Behavioral:  Negative for confusion.    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   S/P lumpectomy of breast 12/03/2011   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[place]) 11/01/2011    Past Medical History:  Diagnosis Date   Arthritis    rheumatoid, take Humira   Arthritis    rheumatoid   Arthritis pain    Back pain    Breast cancer (HCC)    Cancer ([place])    History of dental surgery    Hyperlipidemia    Hypertension    Personal history of radiation therapy    Rash    Rheumatoid arthritis (HCC)    S/P lumpectomy of breast 12/03/2011    Family History  Problem Relation Age of Onset   Breast cancer Mother    Cancer Mother    Kidney cancer Father    Cancer Father    Breast cancer Sister    Cancer Sister    Lung cancer Sister    Colon  cancer Maternal Uncle    Liver cancer Paternal Aunt    Past Surgical History:  Procedure Laterality Date   ABDOMINAL HYSTERECTOMY     APPENDECTOMY     BREAST EXCISIONAL BIOPSY Left 1991   benign   BREAST LUMPECTOMY Right 11/2011   BREAST SURGERY     2 left breast biopsies-benign   left breast biopsies  1991 & 1990   Social History   Social History Narrative   Adult Son in the [employer] History  Administered Date(s) Administered   Fluad Quad(high Dose 65+) 08/12/2019    Influenza, High Dose Seasonal PF 09/10/2017, 09/14/2020   Influenza-Unspecified 07/03/2018   PFIZER(Purple Top)SARS-COV-2 Vaccination 01/14/2020, 02/06/2020, 08/05/2020, 04/20/2021   Zoster, Live 11/24/2012     Objective: Vital Signs: BP 122/70 (BP Location: Left Arm, Patient Position: Sitting, Cuff Size: Normal)    Pulse 98    Ht 5' 1.75" (1.568 m)    Wt 134 lb 12.8 oz (61.1 kg)    BMI 24.86 kg/m    Physical Exam Vitals and nursing note reviewed.  Constitutional:      Appearance: She is well-developed.  HENT:     Head: Normocephalic and atraumatic.  Eyes:     Conjunctiva/sclera: Conjunctivae normal.  Cardiovascular:     Rate and Rhythm: Normal rate and regular rhythm.     Heart sounds: Normal heart sounds.  Pulmonary:     Effort: Pulmonary effort is normal.     Breath sounds: Normal breath sounds.  Abdominal:     General: Bowel sounds are normal.     Palpations: Abdomen is soft.  Musculoskeletal:     Cervical back: Normal range of motion.  Lymphadenopathy:     Cervical: No cervical adenopathy.  Skin:    General: Skin is warm and dry.     Capillary Refill: Capillary refill takes less than 2 seconds.  Neurological:     Mental Status: She is alert and oriented to person, place, and time.  Psychiatric:        Behavior: Behavior normal.     Musculoskeletal Exam: C-spine was in good range of motion.  She has some stiffness range of motion of her lumbar spine.  Shoulder joints, elbow joints, wrist joints, MCPs PIPs and DIPs with good range of motion with no synovitis.  Hip joints, knee joints, ankles, MTPs and PIPs with good range of motion with no synovitis.  CDAI Exam: CDAI Score: 0.4  Patient Global: 2 mm; Provider Global: 2 mm Swollen: 0 ; Tender: 0  Joint Exam 12/06/2021   No joint exam has been documented for this visit   There is currently no information documented on the homunculus. Go to the Rheumatology activity and complete the homunculus joint  exam.  Investigation: No additional findings.  Imaging: No results found.  Recent Labs: Lab Results  Component Value Date   WBC 4.6 09/14/2021   HGB 12.4 09/14/2021   PLT 228 09/14/2021   NA 139 09/14/2021   K 4.0 09/14/2021   CL 102 09/14/2021   CO2 29 09/14/2021   GLUCOSE 112 (H) 09/14/2021   BUN 14 09/14/2021   CREATININE 0.75 09/14/2021   BILITOT 0.4 09/14/2021   ALKPHOS 53 06/20/2017   AST 27 09/14/2021   ALT 18 09/14/2021   PROT 6.5 09/14/2021   ALBUMIN 3.8 06/20/2017   CALCIUM 9.1 09/14/2021   GFRAA 90 03/16/2021    Speciality Comments: PLQ Eye Exam: 08/31/2021  normal @ [facility] [place]. Follow up  in 6 months. Humira-SEs  Procedures:  No procedures performed Allergies: Dilaudid [hydromorphone], Dilaudid [hydromorphone hcl], and Hydrocodone   Assessment / Plan:     Visit Diagnoses: Rheumatoid arthritis-she is clinically doing well with no synovitis on examination.  She has been taking down some of the Christmas decorations has been feeling some stiffness in her shoulders and her wrist.  High risk medication use - Plaquenil 200 mg 1 tablet by mouth daily,MTX 7 tabs po  once weekly, and folic acid 2 mg by mouth daily.  Labs obtained on September 14, 2021 were within normal limits.  We will get labs today and then every 3 months to monitor for drug toxicity.- Plan: CBC with Differential/Platelet, COMPLETE METABOLIC PANEL WITH GFR.  Eye examination on August 31, 2021 was normal.  Information on immunization was placed in the AVS.  She was also advised to hold methotrexate in case she develops an infection and resume after the infection resolves.  Primary osteoarthritis of both knees-she good range of motion bilateral knee joints without any warmth swelling or effusion.  Lower extremity muscle strengthening exercises were discussed.  DDD (degenerative disc disease), lumbar - Followed by Dr. Mina Marble.  She continues to have off-and-on discomfort in her lumbar  spine.  Core strengthening exercises were discussed.  Chronic right SI joint pain - Resolved   Age-related osteoporosis without current pathological fracture - DEXA ordered by Dr. Criss Rosales on 06/06/2020 AP spine BMD 0.871 with T score -2.5. She received a prolia injection: 05/18/2021.  She will contact her endocrinologist's office for repeat Prolia.  Use of calcium rich diet and exercise was emphasized.  History of malignant neoplasm of breast  History of hypertension-blood pressure was normal today.  Morton's neuroma, unspecified laterality  Positive PPD, treated  History of pityriasis rosea  Orders: Orders Placed This Encounter  Procedures   CBC with Differential/Platelet   COMPLETE METABOLIC PANEL WITH GFR   No orders of the defined types were placed in this encounter.    Follow-Up Instructions: Return in about 5 months (around 05/06/2022) for Rheumatoid arthritis.   Bo Merino, MD  Note - This record has been created using Editor, commissioning.  Chart creation errors have been sought, but may not always  have been located. Such creation errors do not reflect on  the standard of medical care.

## 2021-11-23 ENCOUNTER — Other Ambulatory Visit: Payer: Self-pay | Admitting: Physician Assistant

## 2021-11-23 DIAGNOSIS — M0579 Rheumatoid arthritis with rheumatoid factor of multiple sites without organ or systems involvement: Secondary | ICD-10-CM

## 2021-11-23 NOTE — Telephone Encounter (Signed)
Next Visit: 12/06/2021  Last Visit: 08/11/2021  Labs: 09/14/2021 Glucose is 112.  Rest of CMP WNL.  CBC stable.  Eye exam:  08/31/2021  normal   Current Dose per lab not on 09/14/2021: recommended increasing dose of Plaquenil to 200 mg 1 tablet by mouth twice daily Monday through Friday.  AP:[id number] arthritis  Last Fill: 09/04/2021  Okay to refill Plaquenil?

## 2021-12-06 ENCOUNTER — Encounter: Payer: Self-pay | Admitting: Rheumatology

## 2021-12-06 ENCOUNTER — Other Ambulatory Visit: Payer: Self-pay

## 2021-12-06 ENCOUNTER — Ambulatory Visit: Payer: Medicare PPO | Admitting: Rheumatology

## 2021-12-06 VITALS — BP 122/70 | HR 98 | Ht 61.75 in | Wt 134.8 lb

## 2021-12-06 DIAGNOSIS — Z8679 Personal history of other diseases of the circulatory system: Secondary | ICD-10-CM

## 2021-12-06 DIAGNOSIS — M810 Age-related osteoporosis without current pathological fracture: Secondary | ICD-10-CM | POA: Diagnosis not present

## 2021-12-06 DIAGNOSIS — M5136 Other intervertebral disc degeneration, lumbar region: Secondary | ICD-10-CM | POA: Diagnosis not present

## 2021-12-06 DIAGNOSIS — M0579 Rheumatoid arthritis with rheumatoid factor of multiple sites without organ or systems involvement: Secondary | ICD-10-CM

## 2021-12-06 DIAGNOSIS — R7611 Nonspecific reaction to tuberculin skin test without active tuberculosis: Secondary | ICD-10-CM

## 2021-12-06 DIAGNOSIS — Z79899 Other long term (current) drug therapy: Secondary | ICD-10-CM

## 2021-12-06 DIAGNOSIS — M533 Sacrococcygeal disorders, not elsewhere classified: Secondary | ICD-10-CM

## 2021-12-06 DIAGNOSIS — M170 Bilateral primary osteoarthritis of knee: Secondary | ICD-10-CM

## 2021-12-06 DIAGNOSIS — Z853 Personal history of malignant neoplasm of breast: Secondary | ICD-10-CM | POA: Diagnosis not present

## 2021-12-06 DIAGNOSIS — Z872 Personal history of diseases of the skin and subcutaneous tissue: Secondary | ICD-10-CM

## 2021-12-06 DIAGNOSIS — G5760 Lesion of plantar nerve, unspecified lower limb: Secondary | ICD-10-CM

## 2021-12-06 DIAGNOSIS — G8929 Other chronic pain: Secondary | ICD-10-CM

## 2021-12-06 NOTE — Patient Instructions (Signed)
Standing Labs We placed an order today for your standing lab work.   Please have your standing labs drawn in April and every 3 months  If possible, please have your labs drawn 2 weeks prior to your appointment so that the provider can discuss your results at your appointment.  Please note that you may see your imaging and lab results in [place] before we have reviewed them. We may be awaiting multiple results to interpret others before contacting you. Please allow our office up to 72 hours to thoroughly review all of the results before contacting the office for clarification of your results.  We have open lab daily: Monday through Thursday from 1:30-4:30 PM and Friday from 1:30-4:00 PM at the office of Dr. Bo Merino, [facility].   Please be advised, all patients with office appointments requiring lab work will take precedent over walk-in lab work.  If possible, please come for your lab work on Monday and Friday afternoons, as you may experience shorter wait times. The office is located at [address] [place], [place], [place] [address] No appointment is necessary.   Labs are drawn by [facility]. Please bring your co-pay at the time of your lab draw.  You may receive a bill from [facility] for your lab work.  If you wish to have your labs drawn at another location, please call the office 24 hours in advance to send orders.  If you have any questions regarding directions or hours of operation,  please call [phone].   As a reminder, please drink plenty of water prior to coming for your lab work. Thanks!   Vaccines You are taking a medication(s) that can suppress your immune system.  The following immunizations are recommended: Flu annually Covid-19  Td/Tdap (tetanus, diphtheria, pertussis) every 10 years Pneumonia (Prevnar 15 then Pneumovax 23 at least 1 year apart.  Alternatively, can take Prevnar 20 without needing additional dose) Shingrix: 2 doses from 4 weeks  to 6 months apart  Please check with your PCP to make sure you are up to date.  If you have signs or symptoms of an infection or start antibiotics: First, call your PCP for workup of your infection. Hold your medication through the infection, until you complete your antibiotics, and until symptoms resolve if you take the following: Injectable medication (Actemra, Benlysta, Cimzia, Cosentyx, Enbrel, Humira, Kevzara, Orencia, Remicade, Simponi, Stelara, Taltz, Tremfya) Methotrexate Leflunomide (Arava) Mycophenolate (Cellcept) Morrie Sheldon, Olumiant, or Rinvoq

## 2021-12-07 LAB — COMPLETE METABOLIC PANEL WITH GFR
AG Ratio: 2 (calc) (ref 1.0–2.5)
ALT: 18 U/L (ref 6–29)
AST: 27 U/L (ref 10–35)
Albumin: 4.5 g/dL (ref 3.6–5.1)
Alkaline phosphatase (APISO): 56 U/L (ref 37–153)
BUN: 20 mg/dL (ref 7–25)
CO2: 27 mmol/L (ref 20–32)
Calcium: 9.7 mg/dL (ref 8.6–10.4)
Chloride: 102 mmol/L (ref 98–110)
Creat: 0.84 mg/dL (ref 0.60–1.00)
Globulin: 2.2 g/dL (calc) (ref 1.9–3.7)
Glucose, Bld: 91 mg/dL (ref 65–99)
Potassium: 4 mmol/L (ref 3.5–5.3)
Sodium: 141 mmol/L (ref 135–146)
Total Bilirubin: 0.5 mg/dL (ref 0.2–1.2)
Total Protein: 6.7 g/dL (ref 6.1–8.1)
eGFR: 73 mL/min/{1.73_m2} (ref >=60)

## 2021-12-07 LAB — CBC WITH DIFFERENTIAL/PLATELET
Absolute Monocytes: 412 cells/uL (ref 200–950)
Basophils Absolute: 39 cells/uL (ref 0–200)
Basophils Relative: 0.8 %
Eosinophils Absolute: 69 cells/uL (ref 15–500)
Eosinophils Relative: 1.4 %
HCT: 37.3 % (ref 35.0–45.0)
Hemoglobin: 12.8 g/dL (ref 11.7–15.5)
Lymphs Abs: 1382 cells/uL (ref 850–3900)
MCH: 35.5 pg — ABNORMAL HIGH (ref 27.0–33.0)
MCHC: 34.3 g/dL (ref 32.0–36.0)
MCV: 103.3 fL — ABNORMAL HIGH (ref 80.0–100.0)
MPV: 10.4 fL (ref 7.5–12.5)
Monocytes Relative: 8.4 %
Neutro Abs: 2999 cells/uL (ref 1500–7800)
Neutrophils Relative %: 61.2 %
Platelets: 192 10*3/uL (ref 140–400)
RBC: 3.61 10*6/uL — ABNORMAL LOW (ref 3.80–5.10)
RDW: 14.1 % (ref 11.0–15.0)
Total Lymphocyte: 28.2 %
WBC: 4.9 10*3/uL (ref 3.8–10.8)

## 2021-12-07 NOTE — Progress Notes (Signed)
CBC and CMP are stable.

## 2022-01-20 ENCOUNTER — Other Ambulatory Visit: Payer: Self-pay | Admitting: Rheumatology

## 2022-01-20 DIAGNOSIS — M0579 Rheumatoid arthritis with rheumatoid factor of multiple sites without organ or systems involvement: Secondary | ICD-10-CM

## 2022-01-22 NOTE — Telephone Encounter (Signed)
Next Visit: 05/07/2022  Last Visit: 12/06/2021  Labs: 12/06/2021 CBC and CMP are stable.  Eye exam:  08/31/2021  normal   Current Dose per office note 12/06/2021: Plaquenil 200 mg 1 tablet by mouth daily  Lab note from 09/14/2021: Plaquenil to 200 mg 1 tablet by mouth twice daily Monday through Friday.   DX: Rheumatoid arthritis  Last Fill: 11/23/2021  Left message to advise patient to call the office and verify dose of PLQ.

## 2022-01-22 NOTE — Telephone Encounter (Signed)
Patient returned call to the office and states she is taking the PLQ 1 tablet by mouth twice daily Monday through Friday. She states she does not need a refill at this time as she still has medication left.

## 2022-01-23 DIAGNOSIS — I10 Essential (primary) hypertension: Secondary | ICD-10-CM | POA: Diagnosis not present

## 2022-01-23 DIAGNOSIS — E785 Hyperlipidemia, unspecified: Secondary | ICD-10-CM | POA: Diagnosis not present

## 2022-01-23 DIAGNOSIS — R7309 Other abnormal glucose: Secondary | ICD-10-CM | POA: Diagnosis not present

## 2022-01-23 DIAGNOSIS — M0579 Rheumatoid arthritis with rheumatoid factor of multiple sites without organ or systems involvement: Secondary | ICD-10-CM | POA: Diagnosis not present

## 2022-01-23 DIAGNOSIS — I749 Embolism and thrombosis of unspecified artery: Secondary | ICD-10-CM | POA: Diagnosis not present

## 2022-01-23 DIAGNOSIS — M940 Chondrocostal junction syndrome [Tietze]: Secondary | ICD-10-CM | POA: Diagnosis not present

## 2022-02-11 ENCOUNTER — Other Ambulatory Visit: Payer: Self-pay | Admitting: Physician Assistant

## 2022-02-11 ENCOUNTER — Other Ambulatory Visit: Payer: Self-pay | Admitting: Rheumatology

## 2022-02-11 DIAGNOSIS — M0579 Rheumatoid arthritis with rheumatoid factor of multiple sites without organ or systems involvement: Secondary | ICD-10-CM

## 2022-02-12 NOTE — Telephone Encounter (Signed)
Next Visit: 05/07/2022 ? ?Last Visit: 12/06/2021 ? ?Last Fill: 09/04/2021 ? ?DX: Rheumatoid arthritis-she is clinically doing well with no synovitis  ? ?Current Dose per office note 12/06/2021: MTX 7 tabs po  once weekly ? ?Labs: 12/06/2021 CBC and CMP are stable. ? ?Okay to refill MTX?  ?

## 2022-02-12 NOTE — Telephone Encounter (Addendum)
Next Visit: 05/07/2022 ? ?Last Visit: 12/06/2021 ? ?Labs: 12/06/2021 CBC and CMP are stable. ? ?Eye exam: 08/31/2021  normal   ? ?Current Dose per office note 12/06/2021: Plaquenil 200 mg 1 tablet by mouth daily ? ?Per lab note 09/14/2021: Discussed with Dr. Estanislado Pandy and she recommended increasing dose of Plaquenil to 200 mg 1 tablet by mouth twice daily Monday through Friday.   ? ?FW:YOVZCHYIFO arthritis ? ?Last Fill: 11/23/2021 ? ?Patient states she is taking PLQ 1 tablet twice daily M-F.  ? ?Okay to refill Plaquenil?  ?

## 2022-03-01 ENCOUNTER — Encounter: Payer: Self-pay | Admitting: Rheumatology

## 2022-03-05 ENCOUNTER — Telehealth: Payer: Self-pay

## 2022-03-05 DIAGNOSIS — Z79899 Other long term (current) drug therapy: Secondary | ICD-10-CM

## 2022-03-05 DIAGNOSIS — M0579 Rheumatoid arthritis with rheumatoid factor of multiple sites without organ or systems involvement: Secondary | ICD-10-CM

## 2022-03-05 NOTE — Telephone Encounter (Signed)
Patient called requesting labwork orders be sent to [facility] on [address].  Patient plans to go tomorrow, 03/06/22.  ?

## 2022-03-05 NOTE — Telephone Encounter (Signed)
Lab Orders released.  

## 2022-03-06 DIAGNOSIS — Z79899 Other long term (current) drug therapy: Secondary | ICD-10-CM | POA: Diagnosis not present

## 2022-03-06 DIAGNOSIS — M0579 Rheumatoid arthritis with rheumatoid factor of multiple sites without organ or systems involvement: Secondary | ICD-10-CM | POA: Diagnosis not present

## 2022-03-07 NOTE — Progress Notes (Signed)
CMP WNL.   ?RBC count is borderline low.  MCV and MCH are elevated and have trended up.  Vitamin B12 and folate are WNL.

## 2022-03-15 LAB — CBC WITH DIFFERENTIAL/PLATELET
Absolute Monocytes: 340 cells/uL (ref 200–950)
Basophils Absolute: 59 cells/uL (ref 0–200)
Basophils Relative: 1.4 %
Eosinophils Absolute: 59 cells/uL (ref 15–500)
Eosinophils Relative: 1.4 %
HCT: 36.7 % (ref 35.0–45.0)
Hemoglobin: 12.6 g/dL (ref 11.7–15.5)
Lymphs Abs: 1256 cells/uL (ref 850–3900)
MCH: 36.2 pg — ABNORMAL HIGH (ref 27.0–33.0)
MCHC: 34.3 g/dL (ref 32.0–36.0)
MCV: 105.5 fL — ABNORMAL HIGH (ref 80.0–100.0)
MPV: 9.7 fL (ref 7.5–12.5)
Monocytes Relative: 8.1 %
Neutro Abs: 2486 cells/uL (ref 1500–7800)
Neutrophils Relative %: 59.2 %
Platelets: 231 10*3/uL (ref 140–400)
RBC: 3.48 10*6/uL — ABNORMAL LOW (ref 3.80–5.10)
RDW: 13.7 % (ref 11.0–15.0)
Total Lymphocyte: 29.9 %
WBC: 4.2 10*3/uL (ref 3.8–10.8)

## 2022-03-15 LAB — COMPLETE METABOLIC PANEL WITH GFR
AG Ratio: 2.1 (calc) (ref 1.0–2.5)
ALT: 19 U/L (ref 6–29)
AST: 27 U/L (ref 10–35)
Albumin: 4.6 g/dL (ref 3.6–5.1)
Alkaline phosphatase (APISO): 75 U/L (ref 37–153)
BUN: 13 mg/dL (ref 7–25)
CO2: 29 mmol/L (ref 20–32)
Calcium: 9.9 mg/dL (ref 8.6–10.4)
Chloride: 100 mmol/L (ref 98–110)
Creat: 0.76 mg/dL (ref 0.60–1.00)
Globulin: 2.2 g/dL (calc) (ref 1.9–3.7)
Glucose, Bld: 82 mg/dL (ref 65–139)
Potassium: 4 mmol/L (ref 3.5–5.3)
Sodium: 138 mmol/L (ref 135–146)
Total Bilirubin: 0.5 mg/dL (ref 0.2–1.2)
Total Protein: 6.8 g/dL (ref 6.1–8.1)
eGFR: 82 mL/min/{1.73_m2} (ref >=60)

## 2022-03-15 LAB — B12 AND FOLATE PANEL
Folate: >24 ng/mL
Vitamin B-12: 759 pg/mL (ref 200–1100)

## 2022-03-15 LAB — HYDROXYCHLOROQUINE,BLOOD: HYDROXYCHLOROQUINE, (B): 630 ng/mL — ABNORMAL HIGH

## 2022-03-15 NOTE — Progress Notes (Signed)
Plaquenil is within a therapeutic range. Ok to continue current dose of plaquenil.  ?

## 2022-03-26 ENCOUNTER — Other Ambulatory Visit: Payer: Self-pay | Admitting: Family Medicine

## 2022-03-26 DIAGNOSIS — Z1231 Encounter for screening mammogram for malignant neoplasm of breast: Secondary | ICD-10-CM

## 2022-04-05 ENCOUNTER — Ambulatory Visit
Admission: RE | Admit: 2022-04-05 | Discharge: 2022-04-05 | Disposition: A | Discharge status: Home / Self Care | Payer: Medicare PPO | Source: Ambulatory Visit | Attending: Family Medicine | Admitting: Family Medicine

## 2022-04-05 DIAGNOSIS — Z1231 Encounter for screening mammogram for malignant neoplasm of breast: Secondary | ICD-10-CM | POA: Diagnosis not present

## 2022-04-23 NOTE — Progress Notes (Deleted)
Office Visit Note  Patient: Melissa Perez             Date of Birth: 1946-12-02           MRN: [id number]             PCP: Lucianne Lei, MD Referring: Lucianne Lei, MD Visit Date: 05/07/2022 Occupation: '@GUAROCC'$ @  Subjective:  No chief complaint on file.   History of Present Illness: Melissa Perez is a 76 y.o. female ***   Activities of Daily Living:  Patient reports morning stiffness for *** {minute/hour:19697}.   Patient {ACTIONS;DENIES/REPORTS:21021675::"Denies"} nocturnal pain.  Difficulty dressing/grooming: {ACTIONS;DENIES/REPORTS:21021675::"Denies"} Difficulty climbing stairs: {ACTIONS;DENIES/REPORTS:21021675::"Denies"} Difficulty getting out of chair: {ACTIONS;DENIES/REPORTS:21021675::"Denies"} Difficulty using hands for taps, buttons, cutlery, and/or writing: {ACTIONS;DENIES/REPORTS:21021675::"Denies"}  No Rheumatology ROS completed.   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   S/P lumpectomy of breast 12/03/2011   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[place]) 11/01/2011    Past Medical History:  Diagnosis Date   Arthritis    rheumatoid, take Humira   Arthritis    rheumatoid   Arthritis pain    Back pain    Breast cancer ([place])    Cancer ([place])    History of dental surgery    Hyperlipidemia    Hypertension    Personal history of radiation therapy    Rash    Rheumatoid arthritis ([place])    S/P lumpectomy of breast  12/03/2011    Family History  Problem Relation Age of Onset   Breast cancer Mother    Cancer Mother    Kidney cancer Father    Cancer Father    Breast cancer Sister    Cancer Sister    Lung cancer Sister    Colon cancer Maternal Uncle    Liver cancer Paternal Aunt    Past Surgical History:  Procedure Laterality Date   ABDOMINAL HYSTERECTOMY     APPENDECTOMY     BREAST EXCISIONAL BIOPSY Left 1991   benign   BREAST LUMPECTOMY Right 11/2011   BREAST SURGERY     2 left breast biopsies-benign   left breast biopsies  1991 & 1990   Social History   Social History Narrative   Adult Son in the [employer] History  Administered Date(s) Administered   Fluad Quad(high Dose 65+) 08/12/2019   Influenza, High Dose Seasonal PF 09/10/2017, 09/14/2020   Influenza-Unspecified 07/03/2018   PFIZER(Purple Top)SARS-COV-2 Vaccination 01/14/2020, 02/06/2020, 08/05/2020, 04/20/2021   Pfizer Covid-19 Vaccine Bivalent Booster 52yr & up 09/19/2021   Zoster, Live 11/24/2012     Objective: Vital Signs: There were no vitals taken for this visit.   Physical Exam   Musculoskeletal Exam: ***  CDAI Exam: CDAI Score: -- Patient Global: --; Provider Global: -- Swollen: --; Tender: -- Joint Exam 05/07/2022   No joint exam has been documented for this visit   There is currently no information documented on the homunculus. Go to the Rheumatology activity and  complete the homunculus joint exam.  Investigation: No additional findings.  Imaging: MM 3D SCREEN BREAST BILATERAL  Result Date: 04/05/2022 CLINICAL DATA:  Screening. EXAM: DIGITAL SCREENING BILATERAL MAMMOGRAM WITH TOMOSYNTHESIS AND CAD TECHNIQUE: Bilateral screening digital craniocaudal and mediolateral oblique mammograms were obtained. Bilateral screening digital breast tomosynthesis was performed. The images were evaluated with computer-aided detection. COMPARISON:  Previous exam(s). ACR Breast Density Category c: The breast  tissue is heterogeneously dense, which may obscure small masses. FINDINGS: There are no findings suspicious for malignancy. IMPRESSION: No mammographic evidence of malignancy. A result letter of this screening mammogram will be mailed directly to the patient. RECOMMENDATION: Screening mammogram in one year. (Code:SM-B-01Y) BI-RADS CATEGORY  1: Negative. Electronically Signed   By: Audie Pinto M.D.   On: 04/05/2022 15:21    Recent Labs: Lab Results  Component Value Date   WBC 4.2 03/06/2022   HGB 12.6 03/06/2022   PLT 231 03/06/2022   NA 138 03/06/2022   K 4.0 03/06/2022   CL 100 03/06/2022   CO2 29 03/06/2022   GLUCOSE 82 03/06/2022   BUN 13 03/06/2022   CREATININE 0.76 03/06/2022   BILITOT 0.5 03/06/2022   ALKPHOS 53 06/20/2017   AST 27 03/06/2022   ALT 19 03/06/2022   PROT 6.8 03/06/2022   ALBUMIN 3.8 06/20/2017   CALCIUM 9.9 03/06/2022   GFRAA 90 03/16/2021    Speciality Comments: PLQ Eye Exam: 03/01/2022  normal @ [facility] of [place]. Follow up in 6 months. Humira-SEs  Procedures:  No procedures performed Allergies: Dilaudid [hydromorphone], Dilaudid [hydromorphone hcl], and Hydrocodone   Assessment / Plan:     Visit Diagnoses: No diagnosis found.  Orders: No orders of the defined types were placed in this encounter.  No orders of the defined types were placed in this encounter.   Face-to-face time spent with patient was *** minutes. Greater than 50% of time was spent in counseling and coordination of care.  Follow-Up Instructions: No follow-ups on file.   Earnestine Mealing, CMA  Note - This record has been created using Editor, commissioning.  Chart creation errors have been sought, but may not always  have been located. Such creation errors do not reflect on  the standard of medical care.

## 2022-04-26 ENCOUNTER — Ambulatory Visit (INDEPENDENT_AMBULATORY_CARE_PROVIDER_SITE_OTHER): Payer: Medicare PPO | Admitting: Physician Assistant

## 2022-04-26 ENCOUNTER — Ambulatory Visit (INDEPENDENT_AMBULATORY_CARE_PROVIDER_SITE_OTHER): Payer: Medicare PPO

## 2022-04-26 ENCOUNTER — Encounter: Payer: Self-pay | Admitting: Physician Assistant

## 2022-04-26 VITALS — BP 145/75 | HR 91 | Resp 16 | Ht 61.75 in | Wt 135.0 lb

## 2022-04-26 DIAGNOSIS — R7611 Nonspecific reaction to tuberculin skin test without active tuberculosis: Secondary | ICD-10-CM

## 2022-04-26 DIAGNOSIS — M1711 Unilateral primary osteoarthritis, right knee: Secondary | ICD-10-CM

## 2022-04-26 DIAGNOSIS — M533 Sacrococcygeal disorders, not elsewhere classified: Secondary | ICD-10-CM

## 2022-04-26 DIAGNOSIS — Z79899 Other long term (current) drug therapy: Secondary | ICD-10-CM

## 2022-04-26 DIAGNOSIS — Z853 Personal history of malignant neoplasm of breast: Secondary | ICD-10-CM

## 2022-04-26 DIAGNOSIS — M810 Age-related osteoporosis without current pathological fracture: Secondary | ICD-10-CM

## 2022-04-26 DIAGNOSIS — M5136 Other intervertebral disc degeneration, lumbar region: Secondary | ICD-10-CM

## 2022-04-26 DIAGNOSIS — Z8679 Personal history of other diseases of the circulatory system: Secondary | ICD-10-CM

## 2022-04-26 DIAGNOSIS — Z872 Personal history of diseases of the skin and subcutaneous tissue: Secondary | ICD-10-CM

## 2022-04-26 DIAGNOSIS — M0579 Rheumatoid arthritis with rheumatoid factor of multiple sites without organ or systems involvement: Secondary | ICD-10-CM | POA: Diagnosis not present

## 2022-04-26 DIAGNOSIS — M170 Bilateral primary osteoarthritis of knee: Secondary | ICD-10-CM

## 2022-04-26 DIAGNOSIS — G8929 Other chronic pain: Secondary | ICD-10-CM

## 2022-04-26 NOTE — Progress Notes (Signed)
Office Visit Note  Patient: Melissa Perez             Date of Birth: 07-Dec-1945           MRN: [id number]             PCP: Lucianne Lei, MD Referring: Lucianne Lei, MD Visit Date: 04/26/2022 Occupation: '@GUAROCC'$ @  Subjective:  Right knee joint pain   History of Present Illness: Melissa Perez is a 75 y.o. female with history of rheumatoid arthritis and osteoporosis.  She is taking Plaquenil 200 mg 1 tablet by mouth twice daily Monday through Friday only, Methotrexate 7 tablets by mouth once weekly, and folic acid 2 mg by mouth daily.  She is tolerating these medications without any side effects and has not missed any doses recently.  Patient presents today with increased pain in the right knee for 2-3 weeks.  She denies any recent injury or fall.  She states she been performing yard work and attributes most of her increased discomfort to working in the yard.  She had visco gel injections in both knees in September/October 2022 which provided significant relief but her symptoms have started to return.  She has tried voltaren gel, ice, and tylenol for pain relief.  She requested a right knee joint cortisone injection today.  She is also had some increased discomfort in her left shoulder which she attributes to lifting heavy pots outside.  She has been using Voltaren gel and heat which has been alleviating most of her discomfort.  She denies any other joint pain or joint swelling at this time.    Activities of Daily Living:  Patient reports morning stiffness for 0 minutes  Patient Reports nocturnal pain.  Difficulty dressing/grooming: Reports Difficulty climbing stairs: Denies Difficulty getting out of chair: Denies Difficulty using hands for taps, buttons, cutlery, and/or writing: Reports  Review of Systems  Constitutional:  Positive for fatigue.  HENT:  Negative for mouth sores, mouth dryness and nose dryness.   Eyes:  Negative for pain, visual disturbance and dryness.  Respiratory:   Negative for cough, hemoptysis, shortness of breath and difficulty breathing.   Cardiovascular:  Negative for chest pain, palpitations, hypertension and swelling in legs/feet.  Gastrointestinal:  Negative for blood in stool, constipation and diarrhea.  Endocrine: Negative for increased urination.  Genitourinary:  Negative for difficulty urinating and painful urination.  Musculoskeletal:  Positive for joint pain, gait problem, joint pain, joint swelling and muscle weakness. Negative for myalgias, morning stiffness, muscle tenderness and myalgias.  Skin:  Positive for rash. Negative for color change, pallor, hair loss, nodules/bumps, skin tightness, ulcers and sensitivity to sunlight.  Allergic/Immunologic: Negative for susceptible to infections.  Neurological:  Negative for dizziness, numbness and headaches.  Hematological:  Positive for bruising/bleeding tendency. Negative for swollen glands.  Psychiatric/Behavioral:  Positive for sleep disturbance. Negative for depressed mood. The patient is not nervous/anxious.    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   S/P lumpectomy of  breast 12/03/2011   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[place]) 11/01/2011    Past Medical History:  Diagnosis Date   Arthritis    rheumatoid, take Humira   Arthritis    rheumatoid   Arthritis pain    Back pain    Breast cancer ([place])    Cancer ([place])    History of dental  surgery    Hyperlipidemia    Hypertension    Personal history of radiation therapy    Rash    Rheumatoid arthritis ([place])    S/P lumpectomy of breast 12/03/2011    Family History  Problem Relation Age of Onset   Breast cancer Mother    Cancer Mother    Kidney cancer Father    Cancer Father    Breast cancer Sister    Cancer Sister    Lung cancer Sister    Colon cancer Maternal Uncle    Liver cancer Paternal Aunt    Past Surgical History:  Procedure Laterality Date   ABDOMINAL HYSTERECTOMY     APPENDECTOMY     BREAST EXCISIONAL BIOPSY Left 1991   benign   BREAST LUMPECTOMY Right 11/2011   BREAST SURGERY     2 left breast biopsies-benign   left breast biopsies  1991 & 1990   Social History   Social History Narrative   Adult Son in the [employer] History  Administered Date(s) Administered   Fluad Quad(high Dose 65+) 08/12/2019   Influenza, High Dose Seasonal PF 09/10/2017, 09/14/2020   Influenza-Unspecified 07/03/2018   PFIZER(Purple Top)SARS-COV-2 Vaccination 01/14/2020, 02/06/2020, 08/05/2020, 04/20/2021   Pfizer Covid-19 Vaccine Bivalent Booster 23yr & up 09/19/2021   Zoster, Live 11/24/2012     Objective: Vital Signs: BP (!) 145/75 (BP Location: Left Arm, Patient Position: Sitting, Cuff Size: Normal)   Pulse 91   Resp 16   Ht 5' 1.75" (1.568 m)   Wt 135 lb (61.2 kg)   BMI 24.89 kg/m    Physical Exam Vitals and nursing note reviewed.  Constitutional:      Appearance: She is well-developed.  HENT:     Head: Normocephalic and atraumatic.  Eyes:     Conjunctiva/sclera: Conjunctivae normal.  Cardiovascular:     Rate and Rhythm: Normal rate and regular rhythm.     Heart sounds: Normal heart sounds.  Pulmonary:     Effort: Pulmonary effort is normal.     Breath sounds: Normal breath sounds.  Abdominal:     General: Bowel sounds are normal.     Palpations: Abdomen is soft.  Musculoskeletal:     Cervical back: Normal range of motion.  Skin:     General: Skin is warm and dry.     Capillary Refill: Capillary refill takes less than 2 seconds.  Neurological:     Mental Status: She is alert and oriented to person, place, and time.  Psychiatric:        Behavior: Behavior normal.     Musculoskeletal Exam: C-spine, thoracic spine, lumbar spine good range of motion with no discomfort.  No midline spinal tenderness.  Shoulder joints, elbow joints, wrist joints, MCPs, PIPs, DIPs have good range of motion with no synovitis.  Complete fist by motion bilaterally.  Hip joints have good range of motion with no groin pain.  Some discomfort with right knee joint range of motion.  No warmth or effusion of the knee joints noted.  Ankle joints have good range of motion with no tenderness or  joint swelling. CDAI Exam: CDAI Score: 1.7  Patient Global: 5 mm; Provider Global: 2 mm Swollen: 0 ; Tender: 1  Joint Exam 04/26/2022      Right  Left  Knee   Tender        Investigation: No additional findings.  Imaging: MM 3D SCREEN BREAST BILATERAL  Result Date: 04/05/2022 CLINICAL DATA:  Screening. EXAM: DIGITAL SCREENING BILATERAL MAMMOGRAM WITH TOMOSYNTHESIS AND CAD TECHNIQUE: Bilateral screening digital craniocaudal and mediolateral oblique mammograms were obtained. Bilateral screening digital breast tomosynthesis was performed. The images were evaluated with computer-aided detection. COMPARISON:  Previous exam(s). ACR Breast Density Category c: The breast tissue is heterogeneously dense, which may obscure small masses. FINDINGS: There are no findings suspicious for malignancy. IMPRESSION: No mammographic evidence of malignancy. A result letter of this screening mammogram will be mailed directly to the patient. RECOMMENDATION: Screening mammogram in one year. (Code:SM-B-01Y) BI-RADS CATEGORY  1: Negative. Electronically Signed   By: Audie Pinto M.D.   On: 04/05/2022 15:21   XR KNEE 3 VIEW RIGHT  Result Date: 04/26/2022 Moderate medial compartment  narrowing with medial osteophytes was noted.  Moderate patellofemoral narrowing was noted.  No chondrocalcinosis was noted.  No radiographic progression was noted when compared to the x-rays of August 2021. Impression: These findings are consistent with moderate osteoarthritis and moderate chondromalacia patella.   Recent Labs: Lab Results  Component Value Date   WBC 4.2 03/06/2022   HGB 12.6 03/06/2022   PLT 231 03/06/2022   NA 138 03/06/2022   K 4.0 03/06/2022   CL 100 03/06/2022   CO2 29 03/06/2022   GLUCOSE 82 03/06/2022   BUN 13 03/06/2022   CREATININE 0.76 03/06/2022   BILITOT 0.5 03/06/2022   ALKPHOS 53 06/20/2017   AST 27 03/06/2022   ALT 19 03/06/2022   PROT 6.8 03/06/2022   ALBUMIN 3.8 06/20/2017   CALCIUM 9.9 03/06/2022   GFRAA 90 03/16/2021    Speciality Comments: PLQ Eye Exam: 03/01/2022  normal @ [facility] [place]. Follow up in 6 months. Humira-SEs  Procedures:  No procedures performed Allergies: Dilaudid [hydromorphone], Dilaudid [hydromorphone hcl], and Hydrocodone    Assessment / Plan:     Visit Diagnoses: Rheumatoid arthritis involving multiple sites with positive rheumatoid factor ([place]): She has no synovitis on examination today.  She has not had any signs or symptoms of a rheumatoid arthritis flare.  She has clinically been doing well taking methotrexate 7 tablets by mouth once weekly, folic acid 2 mg daily, Plaquenil 200 mg 1 tablet by mouth twice daily Monday through Friday.  She has been tolerating combination therapy without any side effects or recurrent infections.  She presents today with increased discomfort in the right knee joint which she attributes to working in the yard more frequently this spring.  On exam she has good range of motion of the right knee with no warmth or effusion.  X-rays of the right knee were obtained today which were consistent with moderate osteoarthritis and moderate chondromalacia patella.  Given treatment options  were discussed and the patient requested a right knee joint cortisone injection.  She tolerated the procedure well.  Procedure note was completed above.  She plans on continuing to use Voltaren gel topically as needed for symptomatic relief.  She will remain on the current treatment regimen for management of rheumatoid arthritis.  She does not require any refills at this time.  She was advised to notify us if she develops increased joint pain or joint swelling.  She will follow-up in the office in 3 months or sooner if needed.  High risk medication use - Plaquenil 200 mg 1 tablet by mouth twice daily Monday through Friday, Methotrexate 7 tablets by mouth once weekly, and folic acid 2 mg by mouth daily.  CBC and CMP updated on 03/06/22.  Her next lab work will be due in July and every 3 months.  Standing orders for CBC and CMP remain in place.  PLQ Eye Exam: 03/01/2022 normal @ [facility] [place].  She has not had any recent infections.  Discussed the importance of holding methotrexate if she develops signs or symptoms of an infection and to resume once the infection has completely cleared.   Primary osteoarthritis of both knees -She presents today with increased discomfort in the right knee joint which started 2 to 3 weeks ago.  She did not have any injury or fall but feels her symptoms have been exacerbated by performing yard work.  She has not noticed any joint swelling.  She has been applying ice, Voltaren gel, taking Tylenol as needed for symptomatic relief.  She had Visco gel injections in both knees in September/October 2022 which provided significant relief but her symptoms have started to return.  X-rays of the right knee were obtained today for further evaluation.  X-rays were consistent with moderate osteoarthritis and moderate chondromalacia patella.  No radiographic progression since 2021 was noted.  The right knee joint was injected with cortisone.  Procedure note was completed above.   Aftercare was discussed.  She was advised to notify us if she would like to reapply for Visco gel injections for both knees.  Plan: XR KNEE 3 VIEW RIGHT  DDD (degenerative disc disease), lumbar - Dr. Yevonne Aline midline spinal tenderness.  No symptoms of radiculopathy.  Chronic right SI joint pain: She continues to have some discomfort and tenderness over the right SI joint.  No symptoms of sciatica at this time.  Age-related osteoporosis without current pathological fracture: DEXA ordered by Dr. Criss Rosales on 06/06/2020 AP spine BMD 0.871 with T score -2.5.  Patient remains on Prolia 60 mg subcutaneous injections every 6 months.  Other medical conditions are listed as follows:  History of malignant neoplasm of breast  History of hypertension  Positive PPD, treated  History of pityriasis rosea  Orders: Orders Placed This Encounter  Procedures   XR KNEE 3 VIEW RIGHT   No orders of the defined types were placed in this encounter.     Follow-Up Instructions: Return in about 3 months (around 07/27/2022) for Rheumatoid arthritis, Osteoporosis.   Ofilia Neas, PA-C  Note - This record has been created using Dragon software.  Chart creation errors have been sought, but may not always  have been located. Such creation errors do not reflect on  the standard of medical care.

## 2022-04-26 NOTE — Patient Instructions (Signed)
Standing Labs We placed an order today for your standing lab work.   Please have your standing labs drawn in July and every 3 months  If possible, please have your labs drawn 2 weeks prior to your appointment so that the provider can discuss your results at your appointment.  Please note that you may see your imaging and lab results in [facility] before we have reviewed them. We may be awaiting multiple results to interpret others before contacting you. Please allow our office up to 72 hours to thoroughly review all of the results before contacting the office for clarification of your results.  We have open lab daily: Monday through Thursday from 1:30-4:30 PM and Friday from 1:30-4:00 PM at the office of Dr. Bo Merino, [facility].   Please be advised, all patients with office appointments requiring lab work will take precedent over walk-in lab work.  If possible, please come for your lab work on Monday and Friday afternoons, as you may experience shorter wait times. The office is located at [address], [place], [place], [place] [address] No appointment is necessary.   Labs are drawn by [facility]. Please bring your co-pay at the time of your lab draw.  You may receive a bill from [facility] for your lab work.  Please note if you are on Hydroxychloroquine and and an order has been placed for a Hydroxychloroquine level, you will need to have it drawn 4 hours or more after your last dose.  If you wish to have your labs drawn at another location, please call the office 24 hours in advance to send orders.  If you have any questions regarding directions or hours of operation,  please call [phone].   As a reminder, please drink plenty of water prior to coming for your lab work. Thanks!  If you have signs or symptoms of an infection or start antibiotics: First, call your PCP for workup of your infection. Hold your medication through the infection, until you complete your  antibiotics, and until symptoms resolve if you take the following: Injectable medication (Actemra, Benlysta, Cimzia, Cosentyx, Enbrel, Humira, Kevzara, Orencia, Remicade, Simponi, Stelara, Taltz, Tremfya) Methotrexate Leflunomide (Arava) Mycophenolate (Cellcept) Morrie Sheldon, Olumiant, or Rinvoq

## 2022-05-03 ENCOUNTER — Other Ambulatory Visit: Payer: Self-pay | Admitting: Physician Assistant

## 2022-05-03 DIAGNOSIS — M0579 Rheumatoid arthritis with rheumatoid factor of multiple sites without organ or systems involvement: Secondary | ICD-10-CM

## 2022-05-03 NOTE — Telephone Encounter (Signed)
Next Visit: 07/27/2022  Last Visit: 04/26/2022  Labs: 03/06/2022 CMP WNL.  RBC count is borderline low.  MCV and MCH are elevated and have trended up.  Vitamin B12 and folate are WNL.    Eye exam: 03/01/2022  normal    Current Dose per office note 04/26/2022: Plaquenil 200 mg 1 tablet by mouth twice daily Monday through Friday, Methotrexate 7 tablets by mouth once weekly  TG:RMBOBOFPUL arthritis involving multiple sites with positive rheumatoid factor   Last Fill: 02/12/2022  Okay to refill Plaquenil?

## 2022-05-07 ENCOUNTER — Ambulatory Visit: Payer: Medicare PPO | Admitting: Physician Assistant

## 2022-05-07 DIAGNOSIS — M0579 Rheumatoid arthritis with rheumatoid factor of multiple sites without organ or systems involvement: Secondary | ICD-10-CM

## 2022-05-07 DIAGNOSIS — M170 Bilateral primary osteoarthritis of knee: Secondary | ICD-10-CM

## 2022-05-07 DIAGNOSIS — R7611 Nonspecific reaction to tuberculin skin test without active tuberculosis: Secondary | ICD-10-CM

## 2022-05-07 DIAGNOSIS — Z79899 Other long term (current) drug therapy: Secondary | ICD-10-CM

## 2022-05-07 DIAGNOSIS — M810 Age-related osteoporosis without current pathological fracture: Secondary | ICD-10-CM

## 2022-05-07 DIAGNOSIS — Z8679 Personal history of other diseases of the circulatory system: Secondary | ICD-10-CM

## 2022-05-07 DIAGNOSIS — M5136 Other intervertebral disc degeneration, lumbar region: Secondary | ICD-10-CM

## 2022-05-07 DIAGNOSIS — Z853 Personal history of malignant neoplasm of breast: Secondary | ICD-10-CM

## 2022-05-07 DIAGNOSIS — Z872 Personal history of diseases of the skin and subcutaneous tissue: Secondary | ICD-10-CM

## 2022-05-07 DIAGNOSIS — G8929 Other chronic pain: Secondary | ICD-10-CM

## 2022-05-07 DIAGNOSIS — G5760 Lesion of plantar nerve, unspecified lower limb: Secondary | ICD-10-CM

## 2022-05-15 DIAGNOSIS — I10 Essential (primary) hypertension: Secondary | ICD-10-CM | POA: Diagnosis not present

## 2022-05-15 DIAGNOSIS — I749 Embolism and thrombosis of unspecified artery: Secondary | ICD-10-CM | POA: Diagnosis not present

## 2022-05-15 DIAGNOSIS — M940 Chondrocostal junction syndrome [Tietze]: Secondary | ICD-10-CM | POA: Diagnosis not present

## 2022-05-15 DIAGNOSIS — E669 Obesity, unspecified: Secondary | ICD-10-CM | POA: Diagnosis not present

## 2022-05-15 DIAGNOSIS — R7309 Other abnormal glucose: Secondary | ICD-10-CM | POA: Diagnosis not present

## 2022-05-15 DIAGNOSIS — R7301 Impaired fasting glucose: Secondary | ICD-10-CM | POA: Diagnosis not present

## 2022-05-15 DIAGNOSIS — M0579 Rheumatoid arthritis with rheumatoid factor of multiple sites without organ or systems involvement: Secondary | ICD-10-CM | POA: Diagnosis not present

## 2022-05-15 DIAGNOSIS — K279 Peptic ulcer, site unspecified, unspecified as acute or chronic, without hemorrhage or perforation: Secondary | ICD-10-CM | POA: Diagnosis not present

## 2022-05-15 DIAGNOSIS — E785 Hyperlipidemia, unspecified: Secondary | ICD-10-CM | POA: Diagnosis not present

## 2022-05-18 ENCOUNTER — Telehealth: Payer: Self-pay | Admitting: Rheumatology

## 2022-05-18 ENCOUNTER — Other Ambulatory Visit: Payer: Self-pay | Admitting: *Deleted

## 2022-05-18 ENCOUNTER — Ambulatory Visit (HOSPITAL_COMMUNITY)
Admission: RE | Admit: 2022-05-18 | Discharge: 2022-05-18 | Disposition: A | Discharge status: Home / Self Care | Payer: Medicare PPO | Source: Ambulatory Visit | Attending: Physician Assistant | Admitting: Physician Assistant

## 2022-05-18 DIAGNOSIS — K219 Gastro-esophageal reflux disease without esophagitis: Secondary | ICD-10-CM | POA: Diagnosis not present

## 2022-05-18 DIAGNOSIS — I10 Essential (primary) hypertension: Secondary | ICD-10-CM | POA: Diagnosis not present

## 2022-05-18 DIAGNOSIS — E782 Mixed hyperlipidemia: Secondary | ICD-10-CM | POA: Diagnosis not present

## 2022-05-18 DIAGNOSIS — M069 Rheumatoid arthritis, unspecified: Secondary | ICD-10-CM | POA: Diagnosis not present

## 2022-05-18 DIAGNOSIS — M79604 Pain in right leg: Secondary | ICD-10-CM | POA: Insufficient documentation

## 2022-05-18 NOTE — Telephone Encounter (Signed)
The patient reports that 4 to 5 days after having her right knee joint injection at her last office visit she started to have increased calf pain and tightness.  She is having difficulty bearing full weight due to the pain.  She states the pain felt like a "charlie horse."  She states the pain in her right knee resolved with the cortisone injection.    She was evaluated by her PCP Dr. Criss Rosales today who advised the patient to call our office.  Dr. Criss Rosales did not examine the patient's calf at her appointment today. Discussed that I would recommend a venous Doppler ultrasound to rule out a DVT.  She was in agreement.  We will place the order for a right lower extremity venous duplex ultrasound for further evaluation.  Please pend urgent order.

## 2022-05-18 NOTE — Telephone Encounter (Signed)
Patient called the office stating ever since she got a cortisone injection in her right knee in May her lower leg is bothering her. Patient states its painful and cannot put weight on it sometimes. Patient states she has been using Voltaren gel and tylenol with no relief. Patient requests a call back from [place] with what may be going on.

## 2022-05-18 NOTE — Telephone Encounter (Signed)
Patient ha venous duplex ultrasound and the results were negative for DVT. Patient aware. Per Hazel Sams, PA-C, schedule an appointment in office for further evaluation. Patient scheduled for 05/22/2022 at 8:20 am.

## 2022-05-18 NOTE — Progress Notes (Signed)
Preliminary report is negative for a DVT.  Please notify the patient.  If her symptoms persist or worsen please schedule a sooner office visit for further evaluation.

## 2022-05-21 NOTE — Progress Notes (Unsigned)
Office Visit Note  Patient: Melissa Perez             Date of Birth: [birth year]           MRN: [id number]             PCP: Lucianne Lei, MD Referring: Lucianne Lei, MD Visit Date: 05/22/2022 Occupation: '@GUAROCC'$ @  Subjective:  Right leg pain   History of Present Illness: Melissa Perez is a 76 y.o. female with history of rheumatoid arthritis, osteoarthritis, and osteoporosis.  She is taking Plaquenil 200 mg 1 tablet by mouth twice daily Monday through Friday, Methotrexate 7 tablets by mouth once weekly, and folic acid 2 mg by mouth daily.  CBC and CMP updated on 03/06/22.  She will be due to update lab work in July and every 3 months.  Standing orders for CBC and CMP remain in place.  Discussed the importance of holding methotrexate if she develops signs or symptoms of an infection and to resume once the infection has completely cleared.  PLQ Eye Exam: 03/01/2022 normal @ [facility] of Daily Living:  Patient reports morning stiffness for *** {minute/hour:19697}.   Patient {ACTIONS;DENIES/REPORTS:21021675::"Denies"} nocturnal pain.  Difficulty dressing/grooming: {ACTIONS;DENIES/REPORTS:21021675::"Denies"} Difficulty climbing stairs: {ACTIONS;DENIES/REPORTS:21021675::"Denies"} Difficulty getting out of chair: {ACTIONS;DENIES/REPORTS:21021675::"Denies"} Difficulty using hands for taps, buttons, cutlery, and/or writing: {ACTIONS;DENIES/REPORTS:21021675::"Denies"}  No Rheumatology ROS completed.   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   S/P lumpectomy of breast 12/03/2011   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[place]) 11/01/2011    Past Medical History:  Diagnosis Date   Arthritis    rheumatoid, take Humira   Arthritis    rheumatoid   Arthritis pain    Back pain    Breast cancer (HCC)    Cancer ([place])    History of dental surgery    Hyperlipidemia    Hypertension    Personal history of radiation therapy    Rash    Rheumatoid arthritis ([place])    S/P lumpectomy of breast 12/03/2011    Family History  Problem Relation Age of Onset   Breast cancer Mother    Cancer Mother    Kidney cancer Father    Cancer Father    Breast cancer Sister    Cancer Sister    Lung cancer Sister    Colon cancer Maternal Uncle    Liver cancer Paternal Aunt    Past Surgical History:  Procedure Laterality Date   ABDOMINAL HYSTERECTOMY     APPENDECTOMY     BREAST EXCISIONAL BIOPSY Left 1991   benign   BREAST LUMPECTOMY Right 11/2011   BREAST SURGERY     2 left breast biopsies-benign   left breast biopsies  1991 & 1990   Social History   Social History Narrative   Adult Son in the [employer] History  Administered Date(s) Administered   Fluad Quad(high Dose 65+) 08/12/2019   Influenza, High Dose Seasonal  PF 09/10/2017, 09/14/2020   Influenza-Unspecified 07/03/2018   PFIZER(Purple Top)SARS-COV-2 Vaccination 01/14/2020, 02/06/2020, 08/05/2020, 04/20/2021   Pfizer Covid-19 Vaccine Bivalent Booster 87yr & up 09/19/2021   Zoster, Live 11/24/2012     Objective: Vital Signs: There were no vitals taken for this visit.   Physical Exam Vitals and nursing note reviewed.  Constitutional:      Appearance: She is well-developed.  HENT:     Head: Normocephalic and atraumatic.  Eyes:     Conjunctiva/sclera: Conjunctivae normal.  Cardiovascular:     Rate and Rhythm: Normal rate and regular rhythm.      Heart sounds: Normal heart sounds.  Pulmonary:     Effort: Pulmonary effort is normal.     Breath sounds: Normal breath sounds.  Abdominal:     General: Bowel sounds are normal.     Palpations: Abdomen is soft.  Musculoskeletal:     Cervical back: Normal range of motion.  Skin:    General: Skin is warm and dry.     Capillary Refill: Capillary refill takes less than 2 seconds.  Neurological:     Mental Status: She is alert and oriented to person, place, and time.  Psychiatric:        Behavior: Behavior normal.      Musculoskeletal Exam: ***  CDAI Exam: CDAI Score: -- Patient Global: --; Provider Global: -- Swollen: --; Tender: -- Joint Exam 05/22/2022   No joint exam has been documented for this visit   There is currently no information documented on the homunculus. Go to the Rheumatology activity and complete the homunculus joint exam.  Investigation: No additional findings.  Imaging: VAS [place]OWER EXTREMITY VENOUS (DVT)  Result Date: 05/19/2022  Lower Venous DVT Study Patient Name:  Melissa Perez  Date of Exam:   05/18/2022 Medical Rec #: [id number]   Accession #:    [id number] of Birth: 203/06/1946   Patient Gender: F Patient Age:   89years [facility] Procedure:      VAS [place]OWER EXTREMITY VENOUS (DVT) Referring Phys: THazel Sams--------------------------------------------------------------------------------  Indications: Right lower extremity calf pain x 2 weeks.  Performing Technologist: ERonal FearRVS, RCS  Examination Guidelines: A complete evaluation includes B-mode imaging, spectral Doppler, color Doppler, and power Doppler as needed of all accessible portions of each vessel. Bilateral testing is considered an integral part of a complete examination. Limited examinations for reoccurring indications may be performed as noted. The reflux portion of the exam is performed with the patient in reverse Trendelenburg.   +---------+---------------+---------+-----------+----------+--------------+ RIGHT    CompressibilityPhasicitySpontaneityPropertiesThrombus Aging +---------+---------------+---------+-----------+----------+--------------+ CFV      Full                                                        +---------+---------------+---------+-----------+----------+--------------+ SFJ      Full                                                        +---------+---------------+---------+-----------+----------+--------------+ FV Prox  Full                                                        +---------+---------------+---------+-----------+----------+--------------+  FV Mid   Full                                                        +---------+---------------+---------+-----------+----------+--------------+ FV DistalFull                                                        +---------+---------------+---------+-----------+----------+--------------+ POP      Full                                                        +---------+---------------+---------+-----------+----------+--------------+ PTV      Full                                                        +---------+---------------+---------+-----------+----------+--------------+ PERO     Full                                                        +---------+---------------+---------+-----------+----------+--------------+ GSV      Full                                                        +---------+---------------+---------+-----------+----------+--------------+ SSV      Full                                                        +---------+---------------+---------+-----------+----------+--------------+    Findings reported to Hazel Sams at 1:57 pm.  Summary: RIGHT: - There is no evidence of deep vein thrombosis in the lower extremity. - There is no evidence of superficial venous thrombosis.  - No  cystic structure found in the popliteal fossa.  LEFT: - No evidence of common femoral vein obstruction.  *See table(s) above for measurements and observations. Electronically signed by Jamelle Haring on 05/19/2022 at 12:29:13 PM.    Final    XR KNEE 3 VIEW RIGHT  Result Date: 04/26/2022 Moderate medial compartment narrowing with medial osteophytes was noted.  Moderate patellofemoral narrowing was noted.  No chondrocalcinosis was noted.  No radiographic progression was noted when compared to the x-rays of August 2021. Impression: These findings are consistent with moderate osteoarthritis and moderate chondromalacia patella.   Recent Labs: Lab Results  Component Value Date   WBC 4.2 03/06/2022   HGB 12.6 03/06/2022   PLT 231 03/06/2022   NA 138 03/06/2022  K 4.0 03/06/2022   CL 100 03/06/2022   CO2 29 03/06/2022   GLUCOSE 82 03/06/2022   BUN 13 03/06/2022   CREATININE 0.76 03/06/2022   BILITOT 0.5 03/06/2022   ALKPHOS 53 06/20/2017   AST 27 03/06/2022   ALT 19 03/06/2022   PROT 6.8 03/06/2022   ALBUMIN 3.8 06/20/2017   CALCIUM 9.9 03/06/2022   GFRAA 90 03/16/2021    Speciality Comments: PLQ Eye Exam: 03/01/2022  normal @ [facility] [place]. Follow up in 6 months. Humira-SEs  Procedures:  No procedures performed Allergies: Dilaudid [hydromorphone], Dilaudid [hydromorphone hcl], and Hydrocodone   Assessment / Plan:     Visit Diagnoses: Rheumatoid arthritis involving multiple sites with positive rheumatoid factor (HCC)  High risk medication use  Primary osteoarthritis of both knees  Pain in right leg  DDD (degenerative disc disease), lumbar  Chronic right SI joint pain  Age-related osteoporosis without current pathological fracture  History of malignant neoplasm of breast  History of hypertension  Positive PPD, treated  History of pityriasis rosea  Orders: No orders of the defined types were placed in this encounter.  No orders of the defined types  were placed in this encounter.   Face-to-face time spent with patient was *** minutes. Greater than 50% of time was spent in counseling and coordination of care.  Follow-Up Instructions: No follow-ups on file.   Ofilia Neas, PA-C  Note - This record has been created using Dragon software.  Chart creation errors have been sought, but may not always  have been located. Such creation errors do not reflect on  the standard of medical care.

## 2022-05-22 ENCOUNTER — Ambulatory Visit: Payer: Medicare PPO | Admitting: Physician Assistant

## 2022-05-22 ENCOUNTER — Encounter: Payer: Self-pay | Admitting: Physician Assistant

## 2022-05-22 VITALS — BP 132/67 | HR 74 | Resp 16 | Ht 61.75 in | Wt 135.0 lb

## 2022-05-22 DIAGNOSIS — M51369 Other intervertebral disc degeneration, lumbar region without mention of lumbar back pain or lower extremity pain: Secondary | ICD-10-CM

## 2022-05-22 DIAGNOSIS — Z853 Personal history of malignant neoplasm of breast: Secondary | ICD-10-CM | POA: Diagnosis not present

## 2022-05-22 DIAGNOSIS — M170 Bilateral primary osteoarthritis of knee: Secondary | ICD-10-CM | POA: Diagnosis not present

## 2022-05-22 DIAGNOSIS — M810 Age-related osteoporosis without current pathological fracture: Secondary | ICD-10-CM

## 2022-05-22 DIAGNOSIS — M0579 Rheumatoid arthritis with rheumatoid factor of multiple sites without organ or systems involvement: Secondary | ICD-10-CM | POA: Diagnosis not present

## 2022-05-22 DIAGNOSIS — M5136 Other intervertebral disc degeneration, lumbar region: Secondary | ICD-10-CM | POA: Diagnosis not present

## 2022-05-22 DIAGNOSIS — M79604 Pain in right leg: Secondary | ICD-10-CM | POA: Diagnosis not present

## 2022-05-22 DIAGNOSIS — M533 Sacrococcygeal disorders, not elsewhere classified: Secondary | ICD-10-CM | POA: Diagnosis not present

## 2022-05-22 DIAGNOSIS — Z872 Personal history of diseases of the skin and subcutaneous tissue: Secondary | ICD-10-CM

## 2022-05-22 DIAGNOSIS — Z79899 Other long term (current) drug therapy: Secondary | ICD-10-CM

## 2022-05-22 DIAGNOSIS — Z8679 Personal history of other diseases of the circulatory system: Secondary | ICD-10-CM

## 2022-05-22 DIAGNOSIS — R7611 Nonspecific reaction to tuberculin skin test without active tuberculosis: Secondary | ICD-10-CM

## 2022-05-22 DIAGNOSIS — G8929 Other chronic pain: Secondary | ICD-10-CM

## 2022-05-22 NOTE — Patient Instructions (Signed)
Standing Labs We placed an order today for your standing lab work.   Please have your standing labs drawn in September and every 3 months   If possible, please have your labs drawn 2 weeks prior to your appointment so that the provider can discuss your results at your appointment.  Please note that you may see your imaging and lab results in MyChart before we have reviewed them. We may be awaiting multiple results to interpret others before contacting you. Please allow our office up to 72 hours to thoroughly review all of the results before contacting the office for clarification of your results.  We have open lab daily: Monday through Thursday from 1:30-4:30 PM and Friday from 1:30-4:00 PM at the office of Dr. Shaili Deveshwar, [facility].   Please be advised, all patients with office appointments requiring lab work will take precedent over walk-in lab work.  If possible, please come for your lab work on Monday and Friday afternoons, as you may experience shorter wait times. The office is located at [address], [address], [place], [place] [address] No appointment is necessary.   Labs are drawn by [facility]. Please bring your co-pay at the time of your lab draw.  You may receive a bill from [facility] for your lab work.  Please note if you are on Hydroxychloroquine and and an order has been placed for a Hydroxychloroquine level, you will need to have it drawn 4 hours or more after your last dose.  If you wish to have your labs drawn at another location, please call the office 24 hours in advance to send orders.  If you have any questions regarding directions or hours of operation,  please call [phone].   As a reminder, please drink plenty of water prior to coming for your lab work. Thanks!  

## 2022-05-23 LAB — CBC WITH DIFFERENTIAL/PLATELET
Absolute Monocytes: 702 cells/uL (ref 200–950)
Basophils Absolute: 58 cells/uL (ref 0–200)
Basophils Relative: 1 %
Eosinophils Absolute: 70 cells/uL (ref 15–500)
Eosinophils Relative: 1.2 %
HCT: 38.3 % (ref 35.0–45.0)
Hemoglobin: 13.2 g/dL (ref 11.7–15.5)
Lymphs Abs: 1305 cells/uL (ref 850–3900)
MCH: 35.3 pg — ABNORMAL HIGH (ref 27.0–33.0)
MCHC: 34.5 g/dL (ref 32.0–36.0)
MCV: 102.4 fL — ABNORMAL HIGH (ref 80.0–100.0)
MPV: 10.8 fL (ref 7.5–12.5)
Monocytes Relative: 12.1 %
Neutro Abs: 3666 cells/uL (ref 1500–7800)
Neutrophils Relative %: 63.2 %
Platelets: 193 10*3/uL (ref 140–400)
RBC: 3.74 10*6/uL — ABNORMAL LOW (ref 3.80–5.10)
RDW: 13.8 % (ref 11.0–15.0)
Total Lymphocyte: 22.5 %
WBC: 5.8 10*3/uL (ref 3.8–10.8)

## 2022-05-23 LAB — COMPLETE METABOLIC PANEL WITH GFR
AG Ratio: 2.2 (calc) (ref 1.0–2.5)
ALT: 27 U/L (ref 6–29)
AST: 34 U/L (ref 10–35)
Albumin: 4.8 g/dL (ref 3.6–5.1)
Alkaline phosphatase (APISO): 91 U/L (ref 37–153)
BUN: 16 mg/dL (ref 7–25)
CO2: 27 mmol/L (ref 20–32)
Calcium: 10.2 mg/dL (ref 8.6–10.4)
Chloride: 105 mmol/L (ref 98–110)
Creat: 0.84 mg/dL (ref 0.60–1.00)
Globulin: 2.2 g/dL (calc) (ref 1.9–3.7)
Glucose, Bld: 84 mg/dL (ref 65–99)
Potassium: 4.9 mmol/L (ref 3.5–5.3)
Sodium: 142 mmol/L (ref 135–146)
Total Bilirubin: 0.4 mg/dL (ref 0.2–1.2)
Total Protein: 7 g/dL (ref 6.1–8.1)
eGFR: 73 mL/min/{1.73_m2} (ref >=60)

## 2022-05-23 NOTE — Progress Notes (Signed)
CMP WNL. CBC stable.

## 2022-05-27 ENCOUNTER — Other Ambulatory Visit: Payer: Self-pay | Admitting: Physician Assistant

## 2022-07-24 ENCOUNTER — Other Ambulatory Visit: Payer: Self-pay | Admitting: Physician Assistant

## 2022-07-24 NOTE — Telephone Encounter (Signed)
Next Visit: 08/30/2022  Last Visit: 05/22/2022  Last Fill: 05/03/2022  DX: Rheumatoid arthritis involving multiple sites with positive rheumatoid factor   Current Dose per office note 05/22/2022: Methotrexate 7 tablets by mouth once weekly  Labs: 05/22/2022 CMP WNL. CBC stable.   Okay to refill MTX?

## 2022-07-27 ENCOUNTER — Ambulatory Visit: Payer: Medicare PPO | Admitting: Rheumatology

## 2022-07-30 ENCOUNTER — Other Ambulatory Visit: Payer: Self-pay | Admitting: Physician Assistant

## 2022-07-30 DIAGNOSIS — M0579 Rheumatoid arthritis with rheumatoid factor of multiple sites without organ or systems involvement: Secondary | ICD-10-CM

## 2022-07-30 NOTE — Telephone Encounter (Signed)
Next Visit: 08/30/2022   Last Visit: 05/22/2022   Last Fill: 05/03/2022  Dx: Rheumatoid arthritis involving multiple sites with positive rheumatoid factor    Current Dose per office note on 05/22/2022: Plaquenil 200 mg 1 tablet by mouth twice daily Monday through Friday  Labs: 05/22/2022 CMP WNL. CBC stable.   PLQ Eye Exam: 03/01/2022  normal   Okay to refill PLQ?

## 2022-08-03 ENCOUNTER — Other Ambulatory Visit: Payer: Self-pay | Admitting: Rheumatology

## 2022-08-03 MED ORDER — PREDNISONE 5 MG PO TABS: ORAL_TABLET | ORAL | 0 refills | Status: DC

## 2022-08-03 NOTE — Telephone Encounter (Signed)
Patient left a voicemail this morning stating she is having a bad RA flare in both of her hands. Patient states she has been using Voltaren gel and taking Tylenol for the past week with no relief. Patient states it is bringing up bad memories of RA. Patient is requesting a prednisone taper. [facility] [address]

## 2022-08-03 NOTE — Telephone Encounter (Signed)
Patient advised we are sending in a prednisone taper to the pharmacy. Patient advised to take prednisone in the morning with food and avoid the use of NSAIDs.  Patient advised to notify us if her symptoms return after completing the prednisone taper.

## 2022-08-03 NOTE — Telephone Encounter (Signed)
Ok to send in prednisone 20 mg tapering by 5 mg every 4 days.  Take prednisone in the morning with food and avoid the use of NSAIDs.  Please advise the patient to notify us if her symptoms return after completing the prednisone taper.

## 2022-08-07 ENCOUNTER — Other Ambulatory Visit: Payer: Self-pay | Admitting: Physician Assistant

## 2022-08-16 NOTE — Progress Notes (Signed)
Office Visit Note  Patient: Melissa Perez             Date of Birth: Jul 17, 1946           MRN: [id number]             PCP: Lucianne Lei, MD Referring: Lucianne Lei, MD Visit Date: 08/30/2022 Occupation: '@GUAROCC'$ @  Subjective:  Medication management   History of Present Illness: Melissa Perez is a 76 y.o. female history of seropositive rheumatoid arthritis osteoarthritis, degenerative disc disease and osteoporosis.  She had a recent flare for which she required prednisone taper.  She believes it was triggered by either doing yard work or prepping things in the kitchen.  She has been taking methotrexate 7 tablets p.o. weekly along with folic acid 2 mg p.o. daily and hydroxychloroquine 200 mg p.o. twice daily Monday to Friday.  She denies any interruption in the treatment.  He is feeling better now.  She continues to have some stiffness in her hands and knee joints.  Activities of Daily Living:  Patient reports morning stiffness for 0 minutes.   Patient Denies nocturnal pain.  Difficulty dressing/grooming: Denies Difficulty climbing stairs: Denies Difficulty getting out of chair: Denies Difficulty using hands for taps, buttons, cutlery, and/or writing: Denies  Review of Systems  Constitutional:  Positive for fatigue.  HENT:  Negative for mouth sores and mouth dryness.   Eyes:  Negative for dryness.  Respiratory:  Negative for shortness of breath.   Cardiovascular:  Negative for chest pain and palpitations.  Gastrointestinal:  Negative for blood in stool, constipation and diarrhea.  Endocrine: Negative for increased urination.  Genitourinary:  Negative for involuntary urination.  Musculoskeletal:  Negative for joint pain, gait problem, joint pain, joint swelling, myalgias, muscle weakness, morning stiffness, muscle tenderness and myalgias.  Skin:  Negative for color change, rash, hair loss and sensitivity to sunlight.  Allergic/Immunologic: Positive for susceptible to infections.   Neurological:  Negative for dizziness and headaches.  Hematological:  Negative for swollen glands.  Psychiatric/Behavioral:  Negative for depressed mood and sleep disturbance. The patient is not nervous/anxious.     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   S/P lumpectomy of breast 12/03/2011   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[place]) 11/01/2011    Past Medical History:  Diagnosis Date   Arthritis    rheumatoid, take Humira   Arthritis    rheumatoid   Arthritis pain    Back pain    Breast cancer (HCC)    Cancer ([place])    History of dental surgery    Hyperlipidemia    Hypertension    Personal history of radiation therapy    Rash    Rheumatoid arthritis ([place])    S/P lumpectomy of breast 12/03/2011    Family History  Problem Relation Age of Onset   Breast cancer Mother    Cancer Mother    Kidney cancer Father    Cancer Father  Breast cancer Sister    Cancer Sister    Lung cancer Sister    Colon cancer Maternal Uncle    Liver cancer Paternal Aunt    Past Surgical History:  Procedure Laterality Date   ABDOMINAL HYSTERECTOMY     APPENDECTOMY     BREAST EXCISIONAL BIOPSY Left 1991   benign   BREAST LUMPECTOMY Right 11/2011   BREAST SURGERY     2 left breast biopsies-benign   left breast biopsies  1991 & 1990   Social History   Social History Narrative   Adult Son in the [employer] History  Administered  Date(s) Administered   Fluad Quad(high Dose 65+) 08/12/2019   Influenza, High Dose Seasonal PF 09/10/2017, 09/14/2020   Influenza-Unspecified 07/03/2018   PFIZER(Purple Top)SARS-COV-2 Vaccination 01/14/2020, 02/06/2020, 08/05/2020, 04/20/2021   Pfizer Covid-19 Vaccine Bivalent Booster 14yr & up 09/19/2021   Zoster, Live 11/24/2012     Objective: Vital Signs: BP (!) 153/87 (BP Location: Left Arm, Patient Position: Sitting, Cuff Size: Normal)   Pulse 90   Resp 13   Ht 5' 1.75" (1.568 m)   Wt 132 lb 12.8 oz (60.2 kg)   BMI 24.49 kg/m    Physical Exam Vitals and nursing note reviewed.  Constitutional:      Appearance: She is well-developed.  HENT:     Head: Normocephalic and atraumatic.  Eyes:     Conjunctiva/sclera: Conjunctivae normal.  Cardiovascular:     Rate and Rhythm: Normal rate and regular rhythm.     Heart sounds: Normal heart sounds.  Pulmonary:     Effort: Pulmonary effort is normal.     Breath sounds: Normal breath sounds.  Abdominal:     General: Bowel sounds are normal.     Palpations: Abdomen is soft.  Musculoskeletal:     Cervical back: Normal range of motion.  Lymphadenopathy:     Cervical: No cervical adenopathy.  Skin:    General: Skin is warm and dry.     Capillary Refill: Capillary refill takes less than 2 seconds.  Neurological:     Mental Status: She is alert and oriented to person, place, and time.  Psychiatric:        Behavior: Behavior normal.      Musculoskeletal Exam: Cervical spine was in full range of motion.  Shoulder joints, elbow joints, wrist joints, MCPs PIPs and DIPs with good range of motion with no synovitis.  Hip joints and knee joints with good range of motion without any warmth swelling or effusion.  She had no tenderness over ankles or MTPs.  CDAI Exam: CDAI Score: -- Patient Global: 1 mm; Provider Global: 1 mm Swollen: --; Tender: -- Joint Exam 08/30/2022   No joint exam has been documented for this visit   There is  currently no information documented on the homunculus. Go to the Rheumatology activity and complete the homunculus joint exam.  Investigation: No additional findings.  Imaging: No results found.  Recent Labs: Lab Results  Component Value Date   WBC 9.4 08/22/2022   HGB 12.7 08/22/2022   PLT 206 08/22/2022   NA 138 08/22/2022   K 4.1 08/22/2022   CL 101 08/22/2022   CO2 28 08/22/2022   GLUCOSE 88 08/22/2022   BUN 12 08/22/2022   CREATININE 0.75 08/22/2022   BILITOT 0.6 08/22/2022   ALKPHOS 53 06/20/2017   AST 26 08/22/2022   ALT 21 08/22/2022   PROT 7.0 08/22/2022   ALBUMIN 3.8 06/20/2017   CALCIUM  10.0 08/22/2022   GFRAA 90 03/16/2021    Speciality Comments: PLQ Eye Exam: 08/30/2022  normal @ [facility] [place]. Follow up in 6 months. Humira-SEs  Procedures:  No procedures performed Allergies: Dilaudid [hydromorphone], Dilaudid [hydromorphone hcl], and Hydrocodone   Assessment / Plan:     Visit Diagnoses: Rheumatoid arthritis involving multiple sites with positive rheumatoid factor (HCC)-patient had no synovitis on examination.  She states she had a recent prednisone taper for flare.  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  She had been doing well on the combination of hydroxychloroquine and methotrexate.  She does not think that she needs more aggressive therapy at this point.  She had no synovitis on examination today.  High risk medication use - Plaquenil 200 mg 1 tablet by mouth twice daily Monday through Friday, Methotrexate 7 tablets by mouth once weekly, and folic acid 2 mg by mouth daily.  Labs obtained on August 22, 2022 were reviewed.  CBC with differential and CMP with GFR were normal.  She was advised to the labs every 3 months to monitor for drug toxicity.  Information regarding immunization was placed in the AVS.  She was also advised to hold methotrexate if she develops an infection and resume after the infection  resolves.  Primary osteoarthritis of both knees-she has some stiffness in her knee joints off-and-on.  Lower extremity muscle strengthening exercises were discussed.  DDD (degenerative disc disease), lumbar-she is a remittent lower back pain.  Core strengthening exercises were discussed.  Chronic right SI joint pain-is etching exercises were discussed.  Age-related osteoporosis without current pathological fracture - DEXA ordered by Dr. Criss Rosales on 06/06/2020 AP spine BMD 0.871 with T score -2.5.  Patient was on Prolia by Dr. Fransico Setters office.  Patient states she has not had Prolia in the last year.  Increased risk of vertebral fractures after stopping Prolia was discussed.  I advised her to contact Dr. Fransico Setters office to see if she needs her next Prolia injection.  If she has just a Prolia injection she will have to switch to bisphosphonates as soon as possible.  Use of calcium and vitamin D was discussed.  Need for resistive exercises were discussed.  Other medical problems are listed as follows:  History of malignant neoplasm of breast  Positive PPD, treated  History of hypertension-blood pressure was elevated today.  She was advised to monitor blood pressure closely and follow-up with her PCP.  History of pityriasis rosea  Orders: No orders of the defined types were placed in this encounter.  No orders of the defined types were placed in this encounter.    Follow-Up Instructions: Return in about 5 months (around 01/30/2023) for Rheumatoid arthritis, Osteoarthritis, Osteoporosis.   Bo Merino, MD  Note - This record has been created using Editor, commissioning.  Chart creation errors have been sought, but may not always  have been located. Such creation errors do not reflect on  the standard of medical care.

## 2022-08-20 ENCOUNTER — Telehealth: Payer: Self-pay | Admitting: Rheumatology

## 2022-08-20 DIAGNOSIS — Z79899 Other long term (current) drug therapy: Secondary | ICD-10-CM

## 2022-08-20 NOTE — Telephone Encounter (Signed)
Lab Orders released and PLQ eye exam form mailed to patient as requested.

## 2022-08-20 NOTE — Telephone Encounter (Signed)
Patient called the office requesting lab orders be sent to [facility] on [address] Patient states she is going Wednesday. Patient states she also needs a copy of her Plaquenil eye exam so she can have it for her next appointment. Patient would like it mailed. Address verified.

## 2022-08-22 DIAGNOSIS — Z79899 Other long term (current) drug therapy: Secondary | ICD-10-CM | POA: Diagnosis not present

## 2022-08-23 LAB — CBC WITH DIFFERENTIAL/PLATELET
Absolute Monocytes: 696 cells/uL (ref 200–950)
Basophils Absolute: 66 cells/uL (ref 0–200)
Basophils Relative: 0.7 %
Eosinophils Absolute: 132 cells/uL (ref 15–500)
Eosinophils Relative: 1.4 %
HCT: 36.4 % (ref 35.0–45.0)
Hemoglobin: 12.7 g/dL (ref 11.7–15.5)
Lymphs Abs: 1466 cells/uL (ref 850–3900)
MCH: 36.1 pg — ABNORMAL HIGH (ref 27.0–33.0)
MCHC: 34.9 g/dL (ref 32.0–36.0)
MCV: 103.4 fL — ABNORMAL HIGH (ref 80.0–100.0)
MPV: 10 fL (ref 7.5–12.5)
Monocytes Relative: 7.4 %
Neutro Abs: 7041 cells/uL (ref 1500–7800)
Neutrophils Relative %: 74.9 %
Platelets: 206 10*3/uL (ref 140–400)
RBC: 3.52 10*6/uL — ABNORMAL LOW (ref 3.80–5.10)
RDW: 14.5 % (ref 11.0–15.0)
Total Lymphocyte: 15.6 %
WBC: 9.4 10*3/uL (ref 3.8–10.8)

## 2022-08-23 LAB — COMPLETE METABOLIC PANEL WITH GFR
AG Ratio: 1.7 (calc) (ref 1.0–2.5)
ALT: 21 U/L (ref 6–29)
AST: 26 U/L (ref 10–35)
Albumin: 4.4 g/dL (ref 3.6–5.1)
Alkaline phosphatase (APISO): 90 U/L (ref 37–153)
BUN: 12 mg/dL (ref 7–25)
CO2: 28 mmol/L (ref 20–32)
Calcium: 10 mg/dL (ref 8.6–10.4)
Chloride: 101 mmol/L (ref 98–110)
Creat: 0.75 mg/dL (ref 0.60–1.00)
Globulin: 2.6 g/dL (calc) (ref 1.9–3.7)
Glucose, Bld: 88 mg/dL (ref 65–99)
Potassium: 4.1 mmol/L (ref 3.5–5.3)
Sodium: 138 mmol/L (ref 135–146)
Total Bilirubin: 0.6 mg/dL (ref 0.2–1.2)
Total Protein: 7 g/dL (ref 6.1–8.1)
eGFR: 83 mL/min/{1.73_m2} (ref >=60)

## 2022-08-23 NOTE — Progress Notes (Signed)
CBC and CMP are stable.

## 2022-08-30 ENCOUNTER — Encounter: Payer: Self-pay | Admitting: Rheumatology

## 2022-08-30 ENCOUNTER — Ambulatory Visit: Payer: Medicare PPO | Attending: Rheumatology | Admitting: Rheumatology

## 2022-08-30 VITALS — BP 153/87 | HR 90 | Resp 13 | Ht 61.75 in | Wt 132.8 lb

## 2022-08-30 DIAGNOSIS — M533 Sacrococcygeal disorders, not elsewhere classified: Secondary | ICD-10-CM

## 2022-08-30 DIAGNOSIS — R7611 Nonspecific reaction to tuberculin skin test without active tuberculosis: Secondary | ICD-10-CM

## 2022-08-30 DIAGNOSIS — Z8679 Personal history of other diseases of the circulatory system: Secondary | ICD-10-CM | POA: Diagnosis not present

## 2022-08-30 DIAGNOSIS — M810 Age-related osteoporosis without current pathological fracture: Secondary | ICD-10-CM | POA: Diagnosis not present

## 2022-08-30 DIAGNOSIS — M0579 Rheumatoid arthritis with rheumatoid factor of multiple sites without organ or systems involvement: Secondary | ICD-10-CM | POA: Diagnosis not present

## 2022-08-30 DIAGNOSIS — G8929 Other chronic pain: Secondary | ICD-10-CM

## 2022-08-30 DIAGNOSIS — M5136 Other intervertebral disc degeneration, lumbar region: Secondary | ICD-10-CM | POA: Diagnosis not present

## 2022-08-30 DIAGNOSIS — Z79899 Other long term (current) drug therapy: Secondary | ICD-10-CM | POA: Diagnosis not present

## 2022-08-30 DIAGNOSIS — M170 Bilateral primary osteoarthritis of knee: Secondary | ICD-10-CM | POA: Diagnosis not present

## 2022-08-30 DIAGNOSIS — Z872 Personal history of diseases of the skin and subcutaneous tissue: Secondary | ICD-10-CM

## 2022-08-30 DIAGNOSIS — Z853 Personal history of malignant neoplasm of breast: Secondary | ICD-10-CM

## 2022-08-30 DIAGNOSIS — M79604 Pain in right leg: Secondary | ICD-10-CM

## 2022-08-30 NOTE — Patient Instructions (Signed)
Standing Labs We placed an order today for your standing lab work.   Please have your standing labs drawn in December and every 3 months  Please have your labs drawn 2 weeks prior to your appointment so that the provider can discuss your lab results at your appointment.  Please note that you may see your imaging and lab results in [place] before we have reviewed them. We will contact you once all results are reviewed. Please allow our office up to 72 hours to thoroughly review all of the results before contacting the office for clarification of your results.  Lab hours are: Monday through Thursday from 1:30 pm-4:30 pm and Friday from 1:30 pm- 4:00 pm  You may experience shorter wait times on Monday, Thursday or Friday afternoons,.   Effective October 01, 2022, new lab hours will be: Monday through Thursday from 8:00 am -12:30 pm and 1:00 pm-5:00 pm and Friday from 8:00 am-12:00 pm.  Please be advised, all patients with office appointments requiring lab work will take precedent over walk-in lab work.   Labs are drawn by [facility]. Please bring your co-pay at the time of your lab draw.  You may receive a bill from [facility] for your lab work.  Please note if you are on Hydroxychloroquine and and an order has been placed for a Hydroxychloroquine level, you will need to have it drawn 4 hours or more after your last dose.  If you wish to have your labs drawn at another location, please call the office 24 hours in advance so we can fax the orders.  The office is located at [address] [place], [place], [place] [address] No appointment is necessary.    If you have any questions regarding directions or hours of operation,  please call [phone].   As a reminder, please drink plenty of water prior to coming for your lab work. Thanks!   Vaccines You are taking a medication(s) that can suppress your immune system.  The following immunizations are recommended: Flu annually Covid-19  Td/Tdap  (tetanus, diphtheria, pertussis) every 10 years Pneumonia (Prevnar 15 then Pneumovax 23 at least 1 year apart.  Alternatively, can take Prevnar 20 without needing additional dose) Shingrix: 2 doses from 4 weeks to 6 months apart  Please check with your PCP to make sure you are up to date.   If you have signs or symptoms of an infection or start antibiotics: First, call your PCP for workup of your infection. Hold your medication through the infection, until you complete your antibiotics, and until symptoms resolve if you take the following: Injectable medication (Actemra, Benlysta, Cimzia, Cosentyx, Enbrel, Humira, Kevzara, Orencia, Remicade, Simponi, Stelara, Taltz, Tremfya) Methotrexate Leflunomide (Arava) Mycophenolate (Cellcept) Morrie Sheldon, Olumiant, or Rinvoq  COVID-19 vaccine recommendations:   COVID-19 vaccine is recommended for everyone (unless you are allergic to a vaccine component), even if you are on a medication that suppresses your immune system.   If you are on Methotrexate, Cellcept (mycophenolate), Rinvoq, Morrie Sheldon, and Olumiant- hold the medication for 1 week after each vaccine.    Do not take Tylenol or any anti-inflammatory medications (NSAIDs) 24 hours prior to the COVID-19 vaccination.   There is no direct evidence about the efficacy of the COVID-19 vaccine in individuals who are on medications that suppress the immune system.   Even if you are fully vaccinated, and you are on any medications that suppress your immune system, please continue to wear a mask, maintain at least six feet social distance and  practice hand hygiene.    Please see the following web sites for updated information.   [email or web address]

## 2022-09-18 DIAGNOSIS — E785 Hyperlipidemia, unspecified: Secondary | ICD-10-CM | POA: Diagnosis not present

## 2022-09-18 DIAGNOSIS — C50919 Malignant neoplasm of unspecified site of unspecified female breast: Secondary | ICD-10-CM | POA: Diagnosis not present

## 2022-09-18 DIAGNOSIS — Z0000 Encounter for general adult medical examination without abnormal findings: Secondary | ICD-10-CM | POA: Diagnosis not present

## 2022-09-18 DIAGNOSIS — I10 Essential (primary) hypertension: Secondary | ICD-10-CM | POA: Diagnosis not present

## 2022-09-18 DIAGNOSIS — M818 Other osteoporosis without current pathological fracture: Secondary | ICD-10-CM | POA: Diagnosis not present

## 2022-09-18 DIAGNOSIS — R7309 Other abnormal glucose: Secondary | ICD-10-CM | POA: Diagnosis not present

## 2022-09-18 DIAGNOSIS — M069 Rheumatoid arthritis, unspecified: Secondary | ICD-10-CM | POA: Diagnosis not present

## 2022-09-18 DIAGNOSIS — K2100 Gastro-esophageal reflux disease with esophagitis, without bleeding: Secondary | ICD-10-CM | POA: Diagnosis not present

## 2022-10-31 ENCOUNTER — Other Ambulatory Visit: Payer: Self-pay | Admitting: Rheumatology

## 2022-10-31 ENCOUNTER — Other Ambulatory Visit: Payer: Self-pay | Admitting: Physician Assistant

## 2022-10-31 DIAGNOSIS — M0579 Rheumatoid arthritis with rheumatoid factor of multiple sites without organ or systems involvement: Secondary | ICD-10-CM

## 2022-10-31 NOTE — Telephone Encounter (Signed)
Next Visit: 01/31/2023   Last Visit: 08/30/2022   Labs: 08/22/2022 CBC and CMP are stable.   Current Dose per office note 08/30/2022: Methotrexate 7 tablets by mouth once weekly   DX: Rheumatoid arthritis involving multiple sites with positive rheumatoid factor    Last Fill: 07/24/2022  Okay to refill MTX?

## 2022-10-31 NOTE — Telephone Encounter (Signed)
Next Visit: 01/31/2023  Last Visit: 08/30/2022  Labs: 08/22/2022 CBC and CMP are stable.   Eye exam: 08/30/2022  normal    Current Dose per office note 08/30/2022: Plaquenil 200 mg 1 tablet by mouth twice daily Monday through Friday,   DX: Rheumatoid arthritis involving multiple sites with positive rheumatoid factor   Last Fill: 07/30/2022  Okay to refill Plaquenil?

## 2022-11-20 ENCOUNTER — Other Ambulatory Visit: Payer: Self-pay | Admitting: *Deleted

## 2022-11-20 DIAGNOSIS — Z79899 Other long term (current) drug therapy: Secondary | ICD-10-CM

## 2022-11-21 DIAGNOSIS — Z79899 Other long term (current) drug therapy: Secondary | ICD-10-CM | POA: Diagnosis not present

## 2022-11-22 LAB — CBC WITH DIFFERENTIAL/PLATELET
Absolute Monocytes: 304 cells/uL (ref 200–950)
Basophils Absolute: 49 cells/uL (ref 0–200)
Basophils Relative: 1 %
Eosinophils Absolute: 29 cells/uL (ref 15–500)
Eosinophils Relative: 0.6 %
HCT: 38.2 % (ref 35.0–45.0)
Hemoglobin: 13.2 g/dL (ref 11.7–15.5)
Lymphs Abs: 1691 cells/uL (ref 850–3900)
MCH: 35.4 pg — ABNORMAL HIGH (ref 27.0–33.0)
MCHC: 34.6 g/dL (ref 32.0–36.0)
MCV: 102.4 fL — ABNORMAL HIGH (ref 80.0–100.0)
MPV: 9.8 fL (ref 7.5–12.5)
Monocytes Relative: 6.2 %
Neutro Abs: 2827 cells/uL (ref 1500–7800)
Neutrophils Relative %: 57.7 %
Platelets: 250 10*3/uL (ref 140–400)
RBC: 3.73 10*6/uL — ABNORMAL LOW (ref 3.80–5.10)
RDW: 13.2 % (ref 11.0–15.0)
Total Lymphocyte: 34.5 %
WBC: 4.9 10*3/uL (ref 3.8–10.8)

## 2022-11-22 LAB — COMPLETE METABOLIC PANEL WITH GFR
AG Ratio: 1.9 (calc) (ref 1.0–2.5)
ALT: 24 U/L (ref 6–29)
AST: 31 U/L (ref 10–35)
Albumin: 4.7 g/dL (ref 3.6–5.1)
Alkaline phosphatase (APISO): 98 U/L (ref 37–153)
BUN: 24 mg/dL (ref 7–25)
CO2: 30 mmol/L (ref 20–32)
Calcium: 10.1 mg/dL (ref 8.6–10.4)
Chloride: 101 mmol/L (ref 98–110)
Creat: 0.89 mg/dL (ref 0.60–1.00)
Globulin: 2.5 g/dL (calc) (ref 1.9–3.7)
Glucose, Bld: 115 mg/dL — ABNORMAL HIGH (ref 65–99)
Potassium: 4.6 mmol/L (ref 3.5–5.3)
Sodium: 140 mmol/L (ref 135–146)
Total Bilirubin: 0.5 mg/dL (ref 0.2–1.2)
Total Protein: 7.2 g/dL (ref 6.1–8.1)
eGFR: 68 mL/min/{1.73_m2} (ref >=60)

## 2022-11-22 NOTE — Progress Notes (Signed)
CBC and CMP are stable.  MCV stays elevated.  Patient should continue to take folic acid on a regular basis.

## 2023-01-04 DIAGNOSIS — R7309 Other abnormal glucose: Secondary | ICD-10-CM | POA: Diagnosis not present

## 2023-01-04 DIAGNOSIS — I10 Essential (primary) hypertension: Secondary | ICD-10-CM | POA: Diagnosis not present

## 2023-01-04 DIAGNOSIS — E785 Hyperlipidemia, unspecified: Secondary | ICD-10-CM | POA: Diagnosis not present

## 2023-01-04 DIAGNOSIS — R7301 Impaired fasting glucose: Secondary | ICD-10-CM | POA: Diagnosis not present

## 2023-01-04 DIAGNOSIS — Z6824 Body mass index (BMI) 24.0-24.9, adult: Secondary | ICD-10-CM | POA: Diagnosis not present

## 2023-01-04 DIAGNOSIS — K2100 Gastro-esophageal reflux disease with esophagitis, without bleeding: Secondary | ICD-10-CM | POA: Diagnosis not present

## 2023-01-04 DIAGNOSIS — M069 Rheumatoid arthritis, unspecified: Secondary | ICD-10-CM | POA: Diagnosis not present

## 2023-01-17 NOTE — Progress Notes (Unsigned)
Office Visit Note  Patient: Melissa Perez             Date of Birth: 22-Mar-1946           MRN: [id number]             PCP: Lucianne Lei, MD Referring: Lucianne Lei, MD Visit Date: 01/31/2023 Occupation: '@GUAROCC'$ @  Subjective:  Medication monitoring   History of Present Illness: Melissa Perez is a 77 y.o. female with history of seropositive rheumatoid arthritis, osteoarthritis, and osteoporosis.  Patient is currently taking Plaquenil 200 mg 1 tablet by mouth twice daily Monday through Friday, Methotrexate 7 tablets by mouth once weekly, and folic acid 2 mg by mouth daily.  She has been tolerating combination therapy without any side effects.  She has not missed any doses recently.  She is not currently experiencing any increased joint pain or joint swelling.  She denies any morning stiffness recently.  Patient reports that she occasionally has increased pain in her wrist joints that she lifts more than 5 to 10 pounds or performs any overuse activities.  She denies any other joint pain or joint swelling currently.  She has incorporated agile anti-inflammatories including ginger and turmeric tea and walnuts as a source of omega 3.  She denies any recent or recurrent infections.     Activities of Daily Living:  Patient reports morning stiffness for 0 minutes.   Patient Denies nocturnal pain.  Difficulty dressing/grooming: Denies Difficulty climbing stairs: Denies Difficulty getting out of chair: Denies Difficulty using hands for taps, buttons, cutlery, and/or writing: Reports  Review of Systems  Constitutional:  Positive for fatigue.  HENT: Negative.  Negative for mouth sores and mouth dryness.   Eyes: Negative.  Negative for dryness.  Respiratory: Negative.  Negative for shortness of breath.   Cardiovascular: Negative.  Negative for chest pain and palpitations.  Gastrointestinal: Negative.  Negative for blood in stool, constipation and diarrhea.  Endocrine: Negative.  Negative for  increased urination.  Genitourinary: Negative.  Negative for involuntary urination.  Musculoskeletal:  Positive for joint pain, gait problem, joint pain, joint swelling, myalgias, muscle weakness, muscle tenderness and myalgias. Negative for morning stiffness.  Skin:  Positive for hair loss. Negative for color change, rash and sensitivity to sunlight.  Allergic/Immunologic: Positive for susceptible to infections.  Neurological:  Negative for dizziness and headaches.  Hematological: Negative.  Negative for swollen glands.  Psychiatric/Behavioral:  Positive for sleep disturbance. Negative for depressed mood. The patient is not nervous/anxious.     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   S/P lumpectomy of breast 12/03/2011   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[place]) 11/01/2011    Past Medical History:  Diagnosis Date   Arthritis    rheumatoid, take Humira   Arthritis    rheumatoid   Arthritis pain    Back pain    Breast cancer (HCC)    Cancer ([place])    History of dental  surgery    Hyperlipidemia    Hypertension    Personal history of radiation therapy    Rash    Rheumatoid arthritis (HCC)    S/P lumpectomy of breast 12/03/2011    Family History  Problem Relation Age of Onset   Breast cancer Mother    Cancer Mother    Kidney cancer Father    Cancer Father    Breast cancer Sister    Cancer Sister    Lung cancer Sister    Colon  cancer Maternal Uncle    Liver cancer Paternal Aunt    Past Surgical History:  Procedure Laterality Date   ABDOMINAL HYSTERECTOMY     APPENDECTOMY     BREAST EXCISIONAL BIOPSY Left 1991   benign   BREAST LUMPECTOMY Right 11/2011   BREAST SURGERY     2 left breast biopsies-benign   left breast biopsies  1991 & 1990   Social History   Social History Narrative   Adult Son in the [employer] History  Administered Date(s) Administered   Fluad Quad(high Dose 65+) 08/12/2019   Influenza, High Dose Seasonal PF 09/10/2017, 09/14/2020   Influenza-Unspecified 07/03/2018   PFIZER(Purple Top)SARS-COV-2 Vaccination 01/14/2020, 02/06/2020, 08/05/2020, 04/20/2021   Pfizer Covid-19 Vaccine Bivalent Booster 49yr & up 09/19/2021   Zoster, Live 11/24/2012     Objective: Vital Signs: BP (!) 144/79 (BP Location: Left Arm, Patient Position: Sitting, Cuff Size: Normal)   Pulse (!) 106   Resp 16   Ht 5' 1.75" (1.568 m)   Wt 133 lb (60.3 kg)   BMI 24.52 kg/m    Physical Exam Vitals and nursing note reviewed.  Constitutional:      Appearance: She is well-developed.  HENT:     Head: Normocephalic and atraumatic.  Eyes:     Conjunctiva/sclera: Conjunctivae normal.  Cardiovascular:     Rate and Rhythm: Normal rate and regular rhythm.     Heart sounds: Normal heart sounds.  Pulmonary:     Effort: Pulmonary effort is normal.     Breath sounds: Normal breath sounds.  Abdominal:     General: Bowel sounds are normal.     Palpations: Abdomen is soft.  Musculoskeletal:     Cervical back: Normal range of motion.  Skin:    General: Skin is warm and dry.     Capillary Refill: Capillary refill takes less than 2 seconds.  Neurological:     Mental Status: She is alert and oriented to person, place, and time.  Psychiatric:        Behavior: Behavior normal.      Musculoskeletal Exam: C-spine, thoracic spine, and lumbar spine good ROM. Shoulder joints and elbow joints have good ROM.   Slightly limited ROM of the right wrist.  Left wrist has good ROM. MCPs, PIPs, and DIPs good ROM with no synovitis.  Hip joints have good ROM with no groin pain.  Knee joints have good ROM with no warmth or effusion.  Ankle joints have good ROM with no tenderness or synovitis.  No tenderness or synovitis of MTP joints.   CDAI Exam: CDAI Score: -- Patient Global: 2 mm; Provider Global: 2 mm Swollen: --; Tender: -- Joint Exam 01/31/2023   No joint exam has been documented for this visit   There is currently no information documented on the homunculus. Go to the Rheumatology activity and complete the homunculus joint exam.  Investigation: No additional findings.  Imaging: No results found.  Recent Labs: Lab Results  Component Value Date   WBC 4.9 11/21/2022   HGB 13.2 11/21/2022   PLT 250 11/21/2022   NA 140 11/21/2022   K 4.6 11/21/2022   CL 101 11/21/2022   CO2 30 11/21/2022   GLUCOSE 115 (H) 11/21/2022   BUN 24 11/21/2022   CREATININE 0.89 11/21/2022   BILITOT 0.5 11/21/2022   ALKPHOS 53 06/20/2017   AST 31 11/21/2022   ALT 24 11/21/2022   PROT 7.2 11/21/2022   ALBUMIN 3.8 06/20/2017   CALCIUM 10.1 11/21/2022   GFRAA 90 03/16/2021    Speciality Comments: PLQ Eye Exam: 08/30/2022  normal @ [facility] [place]. Follow up in 6 months. Humira-SEs  Procedures:  No procedures performed Allergies: Dilaudid [hydromorphone], Dilaudid [hydromorphone hcl], and Hydrocodone   Assessment / Plan:     Visit Diagnoses: Rheumatoid arthritis involving multiple sites with positive rheumatoid factor ([place]): She has no synovitis on examination today.  She has not had any signs or symptoms of a rheumatoid arthritis flare recently.  Overall she has clinically been doing well on Plaquenil 200 mg 1 tablet by mouth twice daily Monday through Friday and methotrexate 7 tablets by mouth once weekly.  She has been tolerating combination therapy without any side effects and has not missed  any doses recently.  She has occasional discomfort and stiffness in her wrist joints after repetitive or overuse activities.  She had no active inflammation on examination today.  She will remain on combination therapy as prescribed.  She was advised to notify us if she develops signs or symptoms of a flare.  She will follow-up in the office in 5 months or sooner if needed.  High risk medication use - Plaquenil 200 mg 1 tablet by mouth twice daily Monday through Friday, Methotrexate 7 tablets by mouth once weekly, and folic acid 2 mg by mouth daily. CBC and CMP updated on 11/21/22.  Her next lab work will be due in mid-March and every 3 months. Standing orders for CBC and CMP remain in place.  No recent or recurrent infections.  Discussed the importance of holding methotrexate if she develops signs or symptoms of an infection and to resume once the infection has completely cleared.   PLQ Eye Exam: 08/30/2022 normal @ [facility] [place].  Primary osteoarthritis of both knees: Good range of motion of both knee joints noted on examination today.  No warmth or effusion noted.  DDD (degenerative disc disease), lumbar: No midline spinal tenderness at this time.  She has some discomfort in her lower back especially after strenuous activities.   Chronic right SI joint pain: Not currently symptomatic.   Age-related osteoporosis without current pathological fracture - DEXA ordered by Dr. Criss Rosales on 06/06/2020 AP spine BMD 0.871 with T score -2.5.  Patient was receiving Prolia injections through Dr. Fransico Setters office.  We called Dr. Fransico Setters office today to clarify what lab work she had drawn recently at which time one of the nurses requested for [place] to take over the care of osteoporosis.  The patient has not had an updated bone density and has not had Prolia in over 1 year.  It remains unclear why her bone density was not reordered and why she has not been up-to-date with Prolia injections every 6 months.   Discussed with the patient that gaps in therapy can be detrimental to the BMD. Plan to obtain an updated DEXA.  Vitamin D will be checked with her upcoming lab work in March 2024.  She was encouraged  to continue to take a calcium and vitamin D supplement.- Plan: DG BONE DENSITY (DXA)  Vitamin D deficiency - She is taking vitamin D 1000 units daily.  Plan: VITAMIN D 25 Hydroxy (Vit-D Deficiency, Fractures)  Other medical conditions are listed as follows:   History of malignant neoplasm of breast  Positive PPD, treated  History of hypertension: Blood pressure was 144/79 today in the office.  Advised patient to monitor blood pressure closely.  History of pityriasis rosea   Orders: Orders Placed This Encounter  Procedures   DG BONE DENSITY (DXA)   VITAMIN D 25 Hydroxy (Vit-D Deficiency, Fractures)   No orders of the defined types were placed in this encounter.    Follow-Up Instructions: Return in about 5 months (around 07/01/2023) for Rheumatoid arthritis, Osteoarthritis, Osteoporosis.   Ofilia Neas, PA-C  Note - This record has been created using Dragon software.  Chart creation errors have been sought, but may not always  have been located. Such creation errors do not reflect on  the standard of medical care.,

## 2023-01-29 ENCOUNTER — Other Ambulatory Visit: Payer: Self-pay | Admitting: Physician Assistant

## 2023-01-29 DIAGNOSIS — M0579 Rheumatoid arthritis with rheumatoid factor of multiple sites without organ or systems involvement: Secondary | ICD-10-CM

## 2023-01-29 NOTE — Telephone Encounter (Signed)
Next Visit: 01/31/2023  Last Visit: 08/30/2022  Labs: 11/21/2022 CBC and CMP are stable.  MCV stays elevated. Patient should continue to take folic acid on a regular basis.   Eye exam: 08/30/2022 Normal   Current Dose per office note 08/30/2022: Plaquenil 200 mg 1 tablet by mouth twice daily Monday through Friday. Methotrexate 7 tablets by mouth once weekly.  [id number] arthritis involving multiple sites with positive rheumatoid factor   Last Fill: 10/31/2022  Okay to refill Plaquenil and Methotrexate?

## 2023-01-31 ENCOUNTER — Ambulatory Visit: Payer: Medicare PPO | Attending: Physician Assistant | Admitting: Physician Assistant

## 2023-01-31 ENCOUNTER — Encounter: Payer: Self-pay | Admitting: Physician Assistant

## 2023-01-31 VITALS — BP 144/79 | HR 106 | Resp 16 | Ht 61.75 in | Wt 133.0 lb

## 2023-01-31 DIAGNOSIS — R7611 Nonspecific reaction to tuberculin skin test without active tuberculosis: Secondary | ICD-10-CM

## 2023-01-31 DIAGNOSIS — Z872 Personal history of diseases of the skin and subcutaneous tissue: Secondary | ICD-10-CM

## 2023-01-31 DIAGNOSIS — E559 Vitamin D deficiency, unspecified: Secondary | ICD-10-CM

## 2023-01-31 DIAGNOSIS — M170 Bilateral primary osteoarthritis of knee: Secondary | ICD-10-CM | POA: Diagnosis not present

## 2023-01-31 DIAGNOSIS — Z79899 Other long term (current) drug therapy: Secondary | ICD-10-CM

## 2023-01-31 DIAGNOSIS — M533 Sacrococcygeal disorders, not elsewhere classified: Secondary | ICD-10-CM

## 2023-01-31 DIAGNOSIS — Z8679 Personal history of other diseases of the circulatory system: Secondary | ICD-10-CM

## 2023-01-31 DIAGNOSIS — M51369 Other intervertebral disc degeneration, lumbar region without mention of lumbar back pain or lower extremity pain: Secondary | ICD-10-CM

## 2023-01-31 DIAGNOSIS — M810 Age-related osteoporosis without current pathological fracture: Secondary | ICD-10-CM

## 2023-01-31 DIAGNOSIS — Z853 Personal history of malignant neoplasm of breast: Secondary | ICD-10-CM

## 2023-01-31 DIAGNOSIS — M5136 Other intervertebral disc degeneration, lumbar region: Secondary | ICD-10-CM | POA: Diagnosis not present

## 2023-01-31 DIAGNOSIS — M0579 Rheumatoid arthritis with rheumatoid factor of multiple sites without organ or systems involvement: Secondary | ICD-10-CM

## 2023-01-31 DIAGNOSIS — G8929 Other chronic pain: Secondary | ICD-10-CM

## 2023-01-31 NOTE — Patient Instructions (Signed)

## 2023-02-04 ENCOUNTER — Other Ambulatory Visit: Payer: Self-pay | Admitting: Family Medicine

## 2023-02-04 DIAGNOSIS — Z1231 Encounter for screening mammogram for malignant neoplasm of breast: Secondary | ICD-10-CM

## 2023-02-05 DIAGNOSIS — Z780 Asymptomatic menopausal state: Secondary | ICD-10-CM | POA: Diagnosis not present

## 2023-02-05 DIAGNOSIS — M8589 Other specified disorders of bone density and structure, multiple sites: Secondary | ICD-10-CM | POA: Diagnosis not present

## 2023-02-05 LAB — HM DEXA SCAN

## 2023-02-12 ENCOUNTER — Telehealth: Payer: Self-pay | Admitting: *Deleted

## 2023-02-12 NOTE — Telephone Encounter (Addendum)
Received DEXA results from [facility].  Date of Scan: 02/05/2023  Lowest T-score:-2.3  BMD:0.698  Lowest site measured:Right Femoral Neck  DX: Osteopenia  Significant changes in BMD and site measured (5% and above):11% Left Total Femur, -9 % AP Total Spine  Current Regimen:Calcium, Vitamin D   Recommendation:Repeat in 2 years, Calcium, Vitamin D and exercise  Reviewed by:Dr. Bo Merino   Next Appointment:  07/01/2023  Patient advised of results and recommendations.

## 2023-02-19 ENCOUNTER — Encounter: Payer: Self-pay | Admitting: Rheumatology

## 2023-02-19 ENCOUNTER — Other Ambulatory Visit: Payer: Self-pay

## 2023-02-19 DIAGNOSIS — Z79899 Other long term (current) drug therapy: Secondary | ICD-10-CM

## 2023-02-20 DIAGNOSIS — Z79899 Other long term (current) drug therapy: Secondary | ICD-10-CM | POA: Diagnosis not present

## 2023-02-21 LAB — COMPLETE METABOLIC PANEL WITH GFR
AG Ratio: 1.9 (calc) (ref 1.0–2.5)
ALT: 21 U/L (ref 6–29)
AST: 28 U/L (ref 10–35)
Albumin: 4.5 g/dL (ref 3.6–5.1)
Alkaline phosphatase (APISO): 100 U/L (ref 37–153)
BUN: 16 mg/dL (ref 7–25)
CO2: 27 mmol/L (ref 20–32)
Calcium: 9.7 mg/dL (ref 8.6–10.4)
Chloride: 100 mmol/L (ref 98–110)
Creat: 0.81 mg/dL (ref 0.60–1.00)
Globulin: 2.4 g/dL (calc) (ref 1.9–3.7)
Glucose, Bld: 86 mg/dL (ref 65–99)
Potassium: 4 mmol/L (ref 3.5–5.3)
Sodium: 138 mmol/L (ref 135–146)
Total Bilirubin: 0.4 mg/dL (ref 0.2–1.2)
Total Protein: 6.9 g/dL (ref 6.1–8.1)
eGFR: 76 mL/min/{1.73_m2} (ref >=60)

## 2023-02-21 LAB — CBC WITH DIFFERENTIAL/PLATELET
Absolute Monocytes: 475 cells/uL (ref 200–950)
Basophils Absolute: 38 cells/uL (ref 0–200)
Basophils Relative: 0.7 %
Eosinophils Absolute: 103 cells/uL (ref 15–500)
Eosinophils Relative: 1.9 %
HCT: 35.1 % (ref 35.0–45.0)
Hemoglobin: 12.1 g/dL (ref 11.7–15.5)
Lymphs Abs: 1976 cells/uL (ref 850–3900)
MCH: 34.8 pg — ABNORMAL HIGH (ref 27.0–33.0)
MCHC: 34.5 g/dL (ref 32.0–36.0)
MCV: 100.9 fL — ABNORMAL HIGH (ref 80.0–100.0)
MPV: 9.8 fL (ref 7.5–12.5)
Monocytes Relative: 8.8 %
Neutro Abs: 2808 cells/uL (ref 1500–7800)
Neutrophils Relative %: 52 %
Platelets: 241 10*3/uL (ref 140–400)
RBC: 3.48 10*6/uL — ABNORMAL LOW (ref 3.80–5.10)
RDW: 13.6 % (ref 11.0–15.0)
Total Lymphocyte: 36.6 %
WBC: 5.4 10*3/uL (ref 3.8–10.8)

## 2023-03-05 ENCOUNTER — Telehealth: Payer: Self-pay

## 2023-03-05 NOTE — Telephone Encounter (Signed)
Patient contacted the office stating she is going to stop by the office to pick up a Plaquenil eye exam form. Patient plans to stop by tomorrow 03/06/2023 before noon.

## 2023-03-06 NOTE — Telephone Encounter (Signed)
Opened in error

## 2023-03-07 DIAGNOSIS — M0570 Rheumatoid arthritis with rheumatoid factor of unspecified site without organ or systems involvement: Secondary | ICD-10-CM | POA: Diagnosis not present

## 2023-03-07 DIAGNOSIS — Z79899 Other long term (current) drug therapy: Secondary | ICD-10-CM | POA: Diagnosis not present

## 2023-03-07 DIAGNOSIS — H43813 Vitreous degeneration, bilateral: Secondary | ICD-10-CM | POA: Diagnosis not present

## 2023-03-07 DIAGNOSIS — H2513 Age-related nuclear cataract, bilateral: Secondary | ICD-10-CM | POA: Diagnosis not present

## 2023-03-25 ENCOUNTER — Other Ambulatory Visit: Payer: Self-pay | Admitting: Physician Assistant

## 2023-03-25 NOTE — Telephone Encounter (Signed)
Last Fill: 05/28/2022  Next Visit: 07/01/2023  Last Visit: 01/31/2023  Dx: Rheumatoid arthritis involving multiple sites with positive rheumatoid factor   Current Dose per office note on 01/31/2023: folic acid 2 mg by mouth daily.   Okay to refill Folic Acid?

## 2023-03-30 ENCOUNTER — Other Ambulatory Visit: Payer: Self-pay | Admitting: Physician Assistant

## 2023-03-30 DIAGNOSIS — M0579 Rheumatoid arthritis with rheumatoid factor of multiple sites without organ or systems involvement: Secondary | ICD-10-CM

## 2023-04-01 NOTE — Telephone Encounter (Signed)
Last Fill: 01/29/2023  Eye exam: 03/07/2023   Labs: 02/20/2023 CMP WNL. CBC stable.  Next Visit: 07/01/2023  Last Visit: 01/31/2023  [id number] arthritis involving multiple sites with positive rheumatoid factor   Current Dose per office note on 01/31/2023: Plaquenil 200 mg 1 tablet by mouth twice daily Monday through Friday   Okay to refill Plaquenil?

## 2023-04-08 ENCOUNTER — Ambulatory Visit
Admission: RE | Admit: 2023-04-08 | Discharge: 2023-04-08 | Disposition: A | Discharge status: Home / Self Care | Payer: Medicare PPO | Source: Ambulatory Visit | Attending: Family Medicine | Admitting: Family Medicine

## 2023-04-08 DIAGNOSIS — Z1231 Encounter for screening mammogram for malignant neoplasm of breast: Secondary | ICD-10-CM | POA: Diagnosis not present

## 2023-04-22 ENCOUNTER — Other Ambulatory Visit: Payer: Self-pay | Admitting: Physician Assistant

## 2023-04-22 NOTE — Telephone Encounter (Signed)
Last Fill: 01/29/2023  Labs: 02/20/2023 CMP WNL. CBC stable.  Next Visit: 07/01/2023  Last Visit: 01/31/2023  DX: Rheumatoid arthritis involving multiple sites with positive rheumatoid factor   Current Dose per office note 01/31/2023: Methotrexate 7 tablets by mouth once weekly   Okay to refill Methotrexate?

## 2023-05-06 DIAGNOSIS — L639 Alopecia areata, unspecified: Secondary | ICD-10-CM | POA: Diagnosis not present

## 2023-05-06 DIAGNOSIS — M940 Chondrocostal junction syndrome [Tietze]: Secondary | ICD-10-CM | POA: Diagnosis not present

## 2023-05-06 DIAGNOSIS — E785 Hyperlipidemia, unspecified: Secondary | ICD-10-CM | POA: Diagnosis not present

## 2023-05-06 DIAGNOSIS — R7301 Impaired fasting glucose: Secondary | ICD-10-CM | POA: Diagnosis not present

## 2023-05-06 DIAGNOSIS — I10 Essential (primary) hypertension: Secondary | ICD-10-CM | POA: Diagnosis not present

## 2023-05-22 ENCOUNTER — Other Ambulatory Visit: Payer: Self-pay | Admitting: *Deleted

## 2023-05-22 DIAGNOSIS — Z79899 Other long term (current) drug therapy: Secondary | ICD-10-CM

## 2023-05-22 DIAGNOSIS — E559 Vitamin D deficiency, unspecified: Secondary | ICD-10-CM

## 2023-05-23 DIAGNOSIS — Z79899 Other long term (current) drug therapy: Secondary | ICD-10-CM | POA: Diagnosis not present

## 2023-05-23 DIAGNOSIS — E559 Vitamin D deficiency, unspecified: Secondary | ICD-10-CM | POA: Diagnosis not present

## 2023-05-23 LAB — COMPLETE METABOLIC PANEL WITH GFR
AG Ratio: 2.1 (calc) (ref 1.0–2.5)
ALT: 20 U/L (ref 6–29)
Alkaline phosphatase (APISO): 82 U/L (ref 37–153)
BUN: 14 mg/dL (ref 7–25)
CO2: 29 mmol/L (ref 20–32)
Chloride: 101 mmol/L (ref 98–110)
Creat: 0.75 mg/dL (ref 0.60–1.00)
Glucose, Bld: 90 mg/dL (ref 65–139)
Potassium: 4.1 mmol/L (ref 3.5–5.3)
Sodium: 139 mmol/L (ref 135–146)
Total Bilirubin: 0.5 mg/dL (ref 0.2–1.2)

## 2023-05-23 LAB — CBC WITH DIFFERENTIAL/PLATELET
Basophils Relative: 1.2 %
Eosinophils Relative: 1.4 %
Lymphs Abs: 1800 cells/uL (ref 850–3900)
MCH: 34.5 pg — ABNORMAL HIGH (ref 27.0–33.0)
MPV: 10.1 fL (ref 7.5–12.5)
Neutrophils Relative %: 53.2 %
RDW: 14.2 % (ref 11.0–15.0)
Total Lymphocyte: 36 %

## 2023-05-24 LAB — CBC WITH DIFFERENTIAL/PLATELET
Absolute Monocytes: 410 cells/uL (ref 200–950)
Basophils Absolute: 60 cells/uL (ref 0–200)
Eosinophils Absolute: 70 cells/uL (ref 15–500)
HCT: 36.3 % (ref 35.0–45.0)
Hemoglobin: 12.4 g/dL (ref 11.7–15.5)
MCHC: 34.2 g/dL (ref 32.0–36.0)
MCV: 101.1 fL — ABNORMAL HIGH (ref 80.0–100.0)
Monocytes Relative: 8.2 %
Neutro Abs: 2660 cells/uL (ref 1500–7800)
Platelets: 225 10*3/uL (ref 140–400)
RBC: 3.59 10*6/uL — ABNORMAL LOW (ref 3.80–5.10)
WBC: 5 10*3/uL (ref 3.8–10.8)

## 2023-05-24 LAB — COMPLETE METABOLIC PANEL WITH GFR
AST: 28 U/L (ref 10–35)
Albumin: 4.5 g/dL (ref 3.6–5.1)
Calcium: 10 mg/dL (ref 8.6–10.4)
Globulin: 2.1 g/dL (calc) (ref 1.9–3.7)
Total Protein: 6.6 g/dL (ref 6.1–8.1)
eGFR: 83 mL/min/{1.73_m2} (ref >=60)

## 2023-05-24 LAB — VITAMIN D 25 HYDROXY (VIT D DEFICIENCY, FRACTURES): Vit D, 25-Hydroxy: 37 ng/mL (ref 30–100)

## 2023-05-26 NOTE — Progress Notes (Signed)
CBC and CMP are normal.

## 2023-05-27 NOTE — Progress Notes (Signed)
Vitamin D WNL

## 2023-06-03 NOTE — Therapy (Signed)
OUTPATIENT PHYSICAL THERAPY NEURO EVALUATION   Patient Name: Melissa Perez MRN: [id number] DOB:August 20, 1946, 76 y.o., female Today's Date: 06/04/2023   PCP: Renaye Rakers, MD  REFERRING PROVIDER: Renaye Rakers, MD   END OF SESSION:  PT End of Session - 06/04/23 0851     Visit Number 1    Number of Visits 7    PT Start Time 0846    PT Stop Time 0926    PT Time Calculation (min) 40 min    Equipment Utilized During Treatment Gait belt    Activity Tolerance Patient tolerated treatment well    Behavior During Therapy WFL for tasks assessed/performed             Past Medical History:  Diagnosis Date   Arthritis    rheumatoid, take Humira   Arthritis    rheumatoid   Arthritis pain    Back pain    Breast cancer (HCC)    Cancer (HCC)    History of dental surgery    Hyperlipidemia    Hypertension    Personal history of radiation therapy    Rash    Rheumatoid arthritis (HCC)    S/P lumpectomy of breast 12/03/2011   Past Surgical History:  Procedure Laterality Date   ABDOMINAL HYSTERECTOMY     APPENDECTOMY     BREAST EXCISIONAL BIOPSY Left 1991   benign   BREAST LUMPECTOMY Right 11/2011   BREAST SURGERY     2 left breast biopsies-benign   left breast biopsies  1991 & 1990   Patient Active Problem List   Diagnosis Date Noted   Fracture, Colles, right, closed 11/16/2020   Pruritus of scalp 06/20/2018   DDD (degenerative disc disease), lumbar 05/27/2018   Essential hypertension 03/19/2017   History of pityriasis rosea 03/19/2017   Chronic right SI joint pain 03/19/2017   History of malignant neoplasm of breast 03/19/2017   Eczema 03/05/2017   Vulvar dermatitis 03/05/2017   Rheumatoid arthritis  10/15/2016   High risk medication use 10/15/2016   Morton's metatarsalgia 10/15/2016   Positive PPD, 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   S/P lumpectomy of breast 12/03/2011   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HCC) 11/01/2011    ONSET DATE: 05/15/2023    REFERRING DIAG: R26.9 (ICD-10-CM) - Gait abnormality R26.89 (ICD-10-CM) - Balance disorder    THERAPY DIAG:  Unsteadiness on feet  Muscle weakness (generalized)  Rationale for Evaluation and Treatment: Rehabilitation  SUBJECTIVE:  SUBJECTIVE STATEMENT: Was seen here back in 2021 for PT. Saw a very noticeable difference. Had a fall and fractured her wrist - saw OT for this at end of 2021. Was focused too much on her wrist and forgot what she learned here for balance. Started to do some stuff, but wasn't doing so well without structured exercise. Still doing some walking for exercise. Has to be careful not to aggravate her RA. Is back for some staggering. Feels off balance when she is walking. Notices R knee is a little more bothersome, walking helps it. Does some work out in the yard. Has a gardening bench, but does not use it.  Pt accompanied by: self  PERTINENT HISTORY: PMH: RA, hx of breast cancer,R malignant neoplasm of breast with radiation, HLD, HTN, osteoporosis  PAIN:  Are you having pain? Reports 3/10 pain in bilat hips, probably more soreness than pain   PRECAUTIONS: Fall  WEIGHT BEARING RESTRICTIONS: No  FALLS: Has patient fallen in last 6 months? No  LIVING ENVIRONMENT: Lives with: lives alone Lives in: House/apartment Stairs: Yes: External: 3 steps; none, 7 or 8 steps in the back with a railing  Has following equipment at home: None  PLOF: Independent and Leisure: Working out in the yard   PATIENT GOALS: Wants to get back some stability, does not want to stagger anymore   OBJECTIVE:   COGNITION: Overall cognitive status: Within functional limits for tasks assessed   SENSATION: WFL   POSTURE: No Significant postural limitations  LOWER EXTREMITY MMT:    MMT  Right Eval Left Eval  Hip flexion 4 4+  Hip extension    Hip abduction 5 5  Hip adduction 5 5  Hip internal rotation    Hip external rotation    Knee flexion 5 5  Knee extension 4+ 5  Ankle dorsiflexion 5 5  Ankle plantarflexion    Ankle inversion    Ankle eversion    (Blank rows = not tested)  All tested in sitting    TRANSFERS: Assistive device utilized: None  Sit to stand: Modified independence Stand to sit: Modified independence No UE support    STAIRS: Level of Assistance: SBA Stair Negotiation Technique: Alternating Pattern  with No Rails Number of Stairs: 4  Height of Stairs: 6  Comments: Pt leans backwards when descending for balance   GAIT: Gait pattern: step through pattern and decreased stride length Distance walked: Clinic distances Assistive device utilized: None Level of assistance: SBA Pt reports feeling unsteady at times at home and needing to grab onto walls at times   FUNCTIONAL TESTS:  5 times sit to stand: 21.7 seconds with no UE support 30 seconds chair stand test: 7 sit <> stands with no UE support, pt reporting feeling it in her back, 5/10 RPE afterwards (<10 below average for age) 10 meter walk test: 10.28 seconds = 3.2 ft/sec  R SLS: 12.4 seconds (Trendelenburg noted with incr postural sway), L SLS: at least 20 seconds, pt more steady   M-CTSIB  Condition 1: Firm Surface, EO 30 Sec, Normal Sway  Condition 2: Firm Surface, EC 30 Sec, Normal Sway  Condition 3: Foam Surface, EO 30 Sec, Normal Sway  Condition 4: Foam Surface, EC 30 Sec, Mild Sway      [facility] PT Assessment - 06/04/23 0916       Functional Gait  Assessment   Gait assessed  Yes    Gait Level Surface Walks 20 ft in less than 7 sec but greater  than 5.5 sec, uses assistive device, slower speed, mild gait deviations, or deviates 6-10 in outside of the 12 in walkway width.   6.69   Change in Gait Speed Able to smoothly change walking speed without loss of balance or gait  deviation. Deviate no more than 6 in outside of the 12 in walkway width.    Gait with Horizontal Head Turns Performs head turns smoothly with no change in gait. Deviates no more than 6 in outside 12 in walkway width    Gait with Vertical Head Turns Performs head turns with no change in gait. Deviates no more than 6 in outside 12 in walkway width.    Gait and Pivot Turn Pivot turns safely within 3 sec and stops quickly with no loss of balance.    Step Over Obstacle Is able to step over 2 stacked shoe boxes taped together (9 in total height) without changing gait speed. No evidence of imbalance.    Gait with Narrow Base of Support Ambulates 7-9 steps.    Gait with Eyes Closed Walks 20 ft, uses assistive device, slower speed, mild gait deviations, deviates 6-10 in outside 12 in walkway width. Ambulates 20 ft in less than 9 sec but greater than 7 sec.   7.62   Ambulating Backwards Walks 20 ft, no assistive devices, good speed, no evidence for imbalance, normal gait    Steps Alternating feet, no rail.    Total Score 27    FGA comment: 27/30 = Low Fall Risk             PATIENT SURVEYS:  ABC scale 1020/16 = 63.75% self confidence   TODAY'S TREATMENT:                                                                                                                              N/A during eval    PATIENT EDUCATION: Education details: Clinical findings, POC - being seen for a tune up and reviewing/updating previous HEP as needed  Person educated: Patient Education method: Explanation Education comprehension: verbalized understanding  HOME EXERCISE PROGRAM: Access Code: [email or web address] From previous bout of therapy - will update review as appropriate   GOALS: Goals reviewed with patient? Yes  SHORT TERM GOALS: ALL STGS = LTGS  LONG TERM GOALS: Target date: 07/02/2023  Pt will be independent with final HEP for strength, gait, balance in order to build upon functional gains made in  therapy. Baseline:  Goal status: INITIAL  2.  Pt will improve 30 second chair stand to at least 9 sit<> stands in order to demo improvement for age related norms/improved endurance.  Baseline: 7 sit <> stands, no UE support  Goal status: INITIAL  3.  Pt will improve RLE SLS time to at least 15 seconds for improved stability for functional tasks.  Baseline: R SLS: 12.4 seconds (Trendelenburg noted with incr postural sway) Goal status: INITIAL  4.  Pt will improve ABC scale to at  least a 69% in order to demo improved self confidence with balance tasks  Baseline:63.75% self confidence   Goal status: INITIAL  ASSESSMENT:  CLINICAL IMPRESSION: Patient is a 77 year old female referred to Neuro OPPT for imbalance/gait abnormality.   Pt's PMH is significant for: RA, hx of breast cancer,R malignant neoplasm of breast with radiation, HLD, HTN, osteoporosis. The following deficits were present during the exam: impaired high level balance, decr strength, decr activity tolerance, decr confidence noted with ABC scale for balance. Based on FGA, pt is at a low risk for falls. Pt's 5x sit <> stand indicates an incr fall risk.  Based on 30 second chair stand, pt below age related norms for her age. Pt would benefit from skilled PT to address these impairments and functional limitations to maximize functional mobility independence and review previous HEP for balance/strength.    OBJECTIVE IMPAIRMENTS: Abnormal gait, decreased activity tolerance, decreased balance, decreased strength, and pain.   ACTIVITY LIMITATIONS: bending, stairs, and locomotion level  PARTICIPATION LIMITATIONS: community activity and gardening   PERSONAL FACTORS: Age, Behavior pattern, Past/current experiences, Time since onset of injury/illness/exacerbation, and 3+ comorbidities: RA, hx of breast cancer,R malignant neoplasm of breast with radiation, HLD, HTN, osteoporosis  are also affecting patient's functional outcome.   REHAB  POTENTIAL: Good  CLINICAL DECISION MAKING: Stable/uncomplicated  EVALUATION COMPLEXITY: Low  PLAN:  PT FREQUENCY: 1-2x/week  PT DURATION: 4 weeks  PLANNED INTERVENTIONS: Therapeutic exercises, Therapeutic activity, Neuromuscular re-education, Balance training, Gait training, Patient/Family education, Self Care, Joint mobilization, Stair training, DME instructions, Manual therapy, and Re-evaluation  PLAN FOR NEXT SESSION: review previous HEP and update as appropriate, work on functional BLE strength, balance with unlevel surfaces, SLS, sit <> stands   Sherlie Ban, PT, DPT 06/04/23 9:28 AM

## 2023-06-04 ENCOUNTER — Encounter: Payer: Self-pay | Admitting: Physical Therapy

## 2023-06-04 ENCOUNTER — Ambulatory Visit: Payer: Medicare PPO | Attending: Family Medicine | Admitting: Physical Therapy

## 2023-06-04 DIAGNOSIS — M6281 Muscle weakness (generalized): Secondary | ICD-10-CM | POA: Diagnosis not present

## 2023-06-04 DIAGNOSIS — R2681 Unsteadiness on feet: Secondary | ICD-10-CM | POA: Diagnosis not present

## 2023-06-11 ENCOUNTER — Ambulatory Visit: Payer: Medicare PPO | Admitting: Physical Therapy

## 2023-06-11 DIAGNOSIS — R2681 Unsteadiness on feet: Secondary | ICD-10-CM

## 2023-06-11 DIAGNOSIS — M6281 Muscle weakness (generalized): Secondary | ICD-10-CM | POA: Diagnosis not present

## 2023-06-11 NOTE — Therapy (Signed)
OUTPATIENT PHYSICAL THERAPY NEURO TREATMENT   Patient Name: Melissa Perez MRN: [id number] DOB:08/04/1946, 77 y.o., female Today's Date: 06/11/2023   PCP: Renaye Rakers, MD  REFERRING PROVIDER: Renaye Rakers, MD   END OF SESSION:  PT End of Session - 06/11/23 0801     Visit Number 2    Number of Visits 7    PT Start Time 0800    PT Stop Time 0843    PT Time Calculation (min) 43 min    Equipment Utilized During Treatment Gait belt    Activity Tolerance Patient tolerated treatment well    Behavior During Therapy WFL for tasks assessed/performed              Past Medical History:  Diagnosis Date   Arthritis    rheumatoid, take Humira   Arthritis    rheumatoid   Arthritis pain    Back pain    Breast cancer (HCC)    Cancer (HCC)    History of dental surgery    Hyperlipidemia    Hypertension    Personal history of radiation therapy    Rash    Rheumatoid arthritis (HCC)    S/P lumpectomy of breast 12/03/2011   Past Surgical History:  Procedure Laterality Date   ABDOMINAL HYSTERECTOMY     APPENDECTOMY     BREAST EXCISIONAL BIOPSY Left 1991   benign   BREAST LUMPECTOMY Right 11/2011   BREAST SURGERY     2 left breast biopsies-benign   left breast biopsies  1991 & 1990   Patient Active Problem List   Diagnosis Date Noted   Fracture, Colles, right, closed 11/16/2020   Pruritus of scalp 06/20/2018   DDD (degenerative disc disease), lumbar 05/27/2018   Essential hypertension 03/19/2017   History of pityriasis rosea 03/19/2017   Chronic right SI joint pain 03/19/2017   History of malignant neoplasm of breast 03/19/2017   Eczema 03/05/2017   Vulvar dermatitis 03/05/2017   Rheumatoid arthritis  10/15/2016   High risk medication use 10/15/2016   Morton's metatarsalgia 10/15/2016   Positive PPD, 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   S/P lumpectomy of breast 12/03/2011   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HCC) 11/01/2011    ONSET DATE: 05/15/2023    REFERRING DIAG: R26.9 (ICD-10-CM) - Gait abnormality R26.89 (ICD-10-CM) - Balance disorder    THERAPY DIAG:  Unsteadiness on feet  Muscle weakness (generalized)  Rationale for Evaluation and Treatment: Rehabilitation  SUBJECTIVE:  SUBJECTIVE STATEMENT: Pt reports no acute changes since last visit. Pt reports intermittent knee pain, R>L, sees her rheumatologist this month and will have them address it. Pt reports she has been very active in the yard recently and may have overdone it. Pt reports no pain currently/today.  Pt accompanied by: self  PERTINENT HISTORY: PMH: RA, hx of breast cancer,R malignant neoplasm of breast with radiation, HLD, HTN, osteoporosis  PAIN:  Are you having pain? Reports 3/10 pain in bilat hips, probably more soreness than pain   PRECAUTIONS: Fall  WEIGHT BEARING RESTRICTIONS: No  FALLS: Has patient fallen in last 6 months? No  LIVING ENVIRONMENT: Lives with: lives alone Lives in: House/apartment Stairs: Yes: External: 3 steps; none, 7 or 8 steps in the back with a railing  Has following equipment at home: None  PLOF: Independent and Leisure: Working out in the yard   PATIENT GOALS: Wants to get back some stability, does not want to stagger anymore   OBJECTIVE:   COGNITION: Overall cognitive status: Within functional limits for tasks assessed   SENSATION: WFL   POSTURE: No Significant postural limitations  LOWER EXTREMITY MMT:    MMT Right Eval Left Eval  Hip flexion 4 4+  Hip extension    Hip abduction 5 5  Hip adduction 5 5  Hip internal rotation    Hip external rotation    Knee flexion 5 5  Knee extension 4+ 5  Ankle dorsiflexion 5 5  Ankle plantarflexion    Ankle inversion    Ankle eversion    (Blank rows = not tested)  All tested  in sitting    TODAY'S TREATMENT:                                                                                                                              TherEx Reviewed previous HEP: Supine bridges x 10 reps Added in TA contraction Added in ball between knees Supine LTR x 5 reps B Sit to stands x 10 reps no UE support Standing squats x 10 reps Has increase in B knee pain so further repetitions deferred  TherAct Gait across uneven mat to work on dynamic standing balance with CGA: Added in alt L/R gumdrop taps with forwards gait 6 x 10 ft, x 2 sets Added in alt L/R gumdrop taps with lateral stepping 6 x 10 ft, x 2 sets Added in alt L/R high marches 6 x 10 ft  PATIENT EDUCATION: Education details: reviewed some exercises on previous HEP and added to HEP Person educated: Patient Education method: Programmer, multimedia, Facilities manager, Actor cues, Verbal cues, and Handouts Education comprehension: verbalized understanding, returned demonstration, and needs further education  HOME EXERCISE PROGRAM: Access Code: [email or web address] From previous bout of therapy - will update review as appropriate   Access Code: [email or web address] URL: [email or web address] Date: 06/11/2023 Prepared by: Peter Congo  Exercises - Supine Bridge with [employer] Between Knees  - 1 x daily - 7  x weekly - 2 sets - 10 reps - Bridge with Abduction and Resistance Loop  - 1 x daily - 7 x weekly - 2 sets - 10 reps - Supine Lower Trunk Rotation  - 1 x daily - 7 x weekly - 1 sets - 10 reps - Marching with Resistance  - 1 x daily - 7 x weekly - 4 sets - 10 reps - Narrow Base of Support Standing Balance Eyes Closed on Foam Pad  - 1 x daily - 7 x weekly - 2 sets - 10 reps - Half Tandem Stance with Head Rotation on Foam Pad  - 1 x daily - 7 x weekly - 2 sets - 10 reps - Half Tandem Stance with Head Nods on Foam Pad  - 1 x daily - 7 x weekly - 2 sets - 10 reps - Heel-Toe walking with head turns and nods  - 1 x daily - 7  x weekly - 4 sets - 10 reps - Seated Piriformis Stretch  - 1 x daily - 7 x weekly - 1 sets - 3 reps - 30 hold - Seated Flexion Stretch with Swiss Ball  - 1 x daily - 7 x weekly - 2 sets - 10 reps - 5 seconds hold - Sit to Stand Without Arm Support  - 1 x daily - 7 x weekly - 3 sets - 10 reps  Bolded = reviewed with patient from previous POC  GOALS: Goals reviewed with patient? Yes  SHORT TERM GOALS: ALL STGS = LTGS  LONG TERM GOALS: Target date: 07/02/2023  Pt will be independent with final HEP for strength, gait, balance in order to build upon functional gains made in therapy. Baseline:  Goal status: INITIAL  2.  Pt will improve 30 second chair stand to at least 9 sit<> stands in order to demo improvement for age related norms/improved endurance.  Baseline: 7 sit <> stands, no UE support  Goal status: INITIAL  3.  Pt will improve RLE SLS time to at least 15 seconds for improved stability for functional tasks.  Baseline: R SLS: 12.4 seconds (Trendelenburg noted with incr postural sway) Goal status: INITIAL  4.  Pt will improve ABC scale to at least a 69% in order to demo improved self confidence with balance tasks  Baseline:63.75% self confidence   Goal status: INITIAL  ASSESSMENT:  CLINICAL IMPRESSION: Emphasis of skilled PT session on reviewing prior HEP from previous round of OPPT, adding to HEP, and working on dynamic standing balance and SLS stability. Reviewed some exercises from prior HEP and adjusted HEP, see bolded above. Pt does fatigue quickly with dynamic standing balance activities, takes several seated and standing rest breaks. Pt continues to benefit from skilled therapy services to work towards improving her global endurance, strength, and work towards independence with final HEP. Continue POC.    OBJECTIVE IMPAIRMENTS: Abnormal gait, decreased activity tolerance, decreased balance, decreased strength, and pain.   ACTIVITY LIMITATIONS: bending, stairs, and  locomotion level  PARTICIPATION LIMITATIONS: community activity and gardening   PERSONAL FACTORS: Age, Behavior pattern, Past/current experiences, Time since onset of injury/illness/exacerbation, and 3+ comorbidities: RA, hx of breast cancer,R malignant neoplasm of breast with radiation, HLD, HTN, osteoporosis  are also affecting patient's functional outcome.   REHAB POTENTIAL: Good  CLINICAL DECISION MAKING: Stable/uncomplicated  EVALUATION COMPLEXITY: Low  PLAN:  PT FREQUENCY: 1-2x/week  PT DURATION: 4 weeks  PLANNED INTERVENTIONS: Therapeutic exercises, Therapeutic activity, Neuromuscular re-education, Balance training, Gait training, Patient/Family education, Self  Care, Joint mobilization, Stair training, DME instructions, Manual therapy, and Re-evaluation  PLAN FOR NEXT SESSION: continue to review previous HEP and update as appropriate, work on functional BLE strength, balance with unlevel surfaces, SLS, sit <> stands (can add resistance)  Peter Congo, PT, DPT, CSRS  06/11/23 8:43 AM

## 2023-06-14 ENCOUNTER — Ambulatory Visit: Payer: Medicare PPO | Admitting: Physical Therapy

## 2023-06-17 NOTE — Progress Notes (Unsigned)
Office Visit Note  Patient: Melissa Perez             Date of Birth: [birth year]           MRN: [id number]             PCP: Renaye Rakers, MD Referring: Renaye Rakers, MD Visit Date: 07/01/2023 Occupation: @GUAROCC @  Subjective:  Right knee joint pain   History of Present Illness: Melissa Perez is a 77 y.o. female with history of seropositive rheumatoid arthritis and osteoarthritis.  Patient remains on Plaquenil 200 mg 1 tablet po BID Monday through Friday, Methotrexate 7 tablets by mouth once weekly,folic acid 2 mg by mouth daily.  She is tolerating combination therapy without any side effects.  Patient experiences intermittent discomfort in both shoulders and her right wrist joint which she attributes to overuse and repetitive activities performing yard work.  Patient states she is also been having increased discomfort in her right knee joint.  She states that she is aware of her right knee discomfort while walking for exercise and has occasional warmth and some fullness in her right knee.  Patient states that she feels that the arthritis in her right knee is progressing compared to the involvement in other joints.  Patient states that she has been going to physical therapy to improve her balance and lower extremity muscle strengthening which she has found to be helpful.  She plans Voltaren gel topically as needed to the right knee as well as takes Tylenol as needed for pain relief.  Her arthralgias are exacerbated by her activity level.    Activities of Daily Living:  Patient reports morning stiffness for 0 minutes.   Patient Reports nocturnal pain.  Difficulty dressing/grooming: Denies Difficulty climbing stairs: Denies Difficulty getting out of chair: Denies Difficulty using hands for taps, buttons, cutlery, and/or writing: Denies  Review of Systems  Constitutional:  Positive for fatigue.  HENT:  Negative for mouth sores and mouth dryness.   Eyes:  Negative for dryness.  Respiratory:   Negative for shortness of breath.   Cardiovascular:  Negative for chest pain and palpitations.  Gastrointestinal:  Negative for blood in stool, constipation and diarrhea.  Endocrine: Negative for increased urination.  Genitourinary:  Negative for involuntary urination.  Musculoskeletal:  Positive for joint pain, joint pain, joint swelling, myalgias and myalgias. Negative for gait problem, muscle weakness, morning stiffness and muscle tenderness.  Skin:  Positive for rash, hair loss and sensitivity to sunlight. Negative for color change.  Allergic/Immunologic: Positive for susceptible to infections.  Neurological:  Negative for dizziness and headaches.  Hematological:  Negative for swollen glands.  Psychiatric/Behavioral:  Negative for depressed mood and sleep disturbance. The patient is not nervous/anxious.     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   S/P lumpectomy of breast 12/03/2011   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HCC) 11/01/2011    Past Medical History:  Diagnosis Date   Arthritis  rheumatoid, take Humira   Arthritis    rheumatoid   Arthritis pain    Back pain    Breast cancer (HCC)    Cancer (HCC)    History of dental surgery    Hyperlipidemia    Hypertension    Personal history of radiation therapy    Rash    Rheumatoid arthritis (HCC)    S/P lumpectomy of breast 12/03/2011    Family History  Problem Relation Age  of Onset   Breast cancer Mother    Cancer Mother    Kidney cancer Father    Cancer Father    Breast cancer Sister    Cancer Sister    Lung cancer Sister    Colon cancer Maternal Uncle    Liver cancer Paternal Aunt    Past Surgical History:  Procedure Laterality Date   ABDOMINAL HYSTERECTOMY     APPENDECTOMY     BREAST EXCISIONAL BIOPSY Left 1991   benign   BREAST LUMPECTOMY Right 11/2011   BREAST SURGERY     2 left breast biopsies-benign   left breast biopsies  1991 & 1990   Social History   Social History Narrative   Adult Son in the [employer] History  Administered Date(s) Administered   Covid-19, Mrna,Vaccine(Spikevax)21yrs and older 06/04/2023   Fluad Quad(high Dose 65+) 08/12/2019   Influenza, High Dose Seasonal PF 09/10/2017, 09/14/2020   Influenza-Unspecified 07/03/2018   PFIZER(Purple Top)SARS-COV-2 Vaccination 01/14/2020, 02/06/2020, 08/05/2020, 04/20/2021   Pfizer Covid-19 Vaccine Bivalent Booster 27yrs & up 09/19/2021   Zoster, Live 11/24/2012     Objective: Vital Signs: BP 122/71 (BP Location: Left Arm, Patient Position: Sitting, Cuff Size: Normal)   Pulse 78   Resp 14   Ht 5\' 1"  (1.549 m)   Wt 136 lb 3.2 oz (61.8 kg)   BMI 25.73 kg/m    Physical Exam Vitals and nursing note reviewed.  Constitutional:      Appearance: She is well-developed.  HENT:     Head: Normocephalic and atraumatic.  Eyes:     Conjunctiva/sclera: Conjunctivae normal.  Cardiovascular:     Rate and Rhythm: Normal rate and regular rhythm.     Heart sounds: Normal heart sounds.  Pulmonary:     Effort: Pulmonary effort is normal.     Breath sounds: Normal breath sounds.  Abdominal:     General: Bowel sounds are normal.     Palpations: Abdomen is soft.  Musculoskeletal:     Cervical back: Normal range of motion.  Lymphadenopathy:     Cervical: No cervical adenopathy.  Skin:    General: Skin is warm and dry.     Capillary Refill: Capillary refill takes less than  2 seconds.  Neurological:     Mental Status: She is alert and oriented to person, place, and time.  Psychiatric:        Behavior: Behavior normal.      Musculoskeletal Exam: C-spine, thoracic spine, lumbar spine have good range of motion.  Shoulder joints have good range of motion.  Trapezius muscle tension tenderness bilaterally.  Elbow joints have good range of motion no tenderness along the joint line.  Limited range of motion of her right wrist.  No tenderness or synovitis over MCP or PIP joints.  Complete fist formation bilaterally.  Hip joints have good range of motion with no groin pain.  Knee joints have good range of motion with some discomfort in the right knee but no warmth or effusion noted.  Ankle  joints have good range of motion with no tenderness or joint swelling.  No tenderness or synovitis over MTP joints.  No evidence of Achilles tendinitis or plantar fasciitis.  CDAI Exam: CDAI Score: 3  Patient Global: 10 / 100; Provider Global: 10 / 100 Swollen: 0 ; Tender: 1  Joint Exam 07/01/2023      Right  Left  Knee   Tender        Investigation: No additional findings.  Imaging: No results found.  Recent Labs: Lab Results  Component Value Date   WBC 5.0 05/23/2023   HGB 12.4 05/23/2023   PLT 225 05/23/2023   NA 139 05/23/2023   K 4.1 05/23/2023   CL 101 05/23/2023   CO2 29 05/23/2023   GLUCOSE 90 05/23/2023   BUN 14 05/23/2023   CREATININE 0.75 05/23/2023   BILITOT 0.5 05/23/2023   ALKPHOS 53 06/20/2017   AST 28 05/23/2023   ALT 20 05/23/2023   PROT 6.6 05/23/2023   ALBUMIN 3.8 06/20/2017   CALCIUM 10.0 05/23/2023   GFRAA 90 03/16/2021    Speciality Comments: PLQ Eye Exam: 03/07/2023 WNL @ [facility] [place]. Follow up in 6 months. Humira-SEs  Procedures:  No procedures performed Allergies: Dilaudid [hydromorphone], Dilaudid [hydromorphone hcl], and Hydrocodone      Assessment / Plan:     Visit Diagnoses: Rheumatoid arthritis involving  multiple sites with positive rheumatoid factor (HCC): She has no synovitis on examination today.  Patient experiences intermittent discomfort in her shoulders and right wrist joint typically after repetitive or overuse activities.  Good range of motion of both shoulder joints noted today with no tenderness upon palpation.  She does have trapezius muscle tension and tenderness bilaterally.  No inflammation noted. She is taking Plaquenil 200 mg 1 tablet by mouth twice daily Monday through Friday and methotrexate 7 tablets by mouth once weekly.  She is tolerating combination therapy with no gaps in therapy.  No medication changes will be made at this time.  She was advised to notify us if she develops signs or symptoms of a flare.  She will follow-up in the office in 5 months or sooner if needed.  High risk medication use - Plaquenil 200 mg 1 tablet po BID Monday through Friday, Methotrexate 7 tablets by mouth once weekly, folic acid 2 mg by mouth daily.  PLQ Eye Exam: 03/07/2023 WNL @ [facility] [place].  CBC and CMP updated on 05/23/23. Her next lab work will be due in September and every 3 months. New standing orders for CBC and CMP placed today.  No recent or recurrent infections.  Discussed the importance of holding methotrexate if she develops signs or symptoms of an infection and to resume once the infection is completely cleared.  - Plan: CBC with Differential/Platelet, COMPLETE METABOLIC PANEL WITH GFR  Primary osteoarthritis of both knees -Patient presents today with increased pain and stiffness in the right knee joint.  She has been having increased discomfort and is more aware of the right knee while walking for exercise.  She notices intermittent warmth and swelling in her right knee.   She has good range of motion of both knee joints on examination today.  No warmth or effusion noted.  X-rays of both knees updated today.  Plan to apply for viscosupplementation for both knees.  Plan: XR  KNEE 3 VIEW RIGHT, XR KNEE 3 VIEW LEFT  Chronic pain of both knees -X-rays of both knees were updated today to assess for radiographic  progression.  Plan on applying for viscosupplementation for both knees.  Plan: XR KNEE 3 VIEW RIGHT, XR KNEE 3 VIEW LEFT  This patient is diagnosed with osteoarthritis of the knee(s).    Radiographs show evidence of joint space narrowing, osteophytes, subchondral sclerosis and/or subchondral cysts.  This patient has knee pain which interferes with functional and activities of daily living.    This patient has experienced inadequate response, adverse effects and/or intolerance with conservative treatments such as acetaminophen, NSAIDS, topical creams, physical therapy or regular exercise, knee bracing and/or weight loss.   This patient has experienced inadequate response or has a contraindication to intra articular steroid injections for at least 3 months.   This patient is not scheduled to have a total knee replacement within 6 months of starting treatment with viscosupplementation.  DDD (degenerative disc disease), lumbar: She is not experiencing any increased discomfort in her lower back at this time.    Chronic right SI joint pain: No SI joint pain currently.   Age-related osteoporosis without current pathological fracture - DEXA ordered by Dr. Parke Simmers 06/06/2020 AP spine BMD 0.871, T score -2.5. Previously prescribed prolia.  DEXA updated on 02/05/2023: Right femoral neck BMD 0.598 with T-score -1.3.  11% improvement in BMD for the left total femur.  -3% change in BMD for right total femur.  -9% change in BMD for AP spine. She is taking vitamin D 1000 units daily and a calcium supplement on a daily basis.Plan to check vitamin D level with upcoming lab work.  Discussed the importance of performing resistive exercises. Discussed the use of fosamax but she is concerned about worsening reflux.   Plan to update DEXA in March 2026.  Medication monitoring encounter -  Patient was receiving Prolia injections through Dr. Tedra Senegal office.  Discontinued prolia.   Vitamin D deficiency -She is taking vitamin D 1000 units daily.  Plan on obtaining vitamin D with her next lab work--future order placed today.  Plan: VITAMIN D 25 Hydroxy (Vit-D Deficiency, Fractures)  Other medical conditions are listed as follows:  History of malignant neoplasm of breast  Positive PPD, treated  History of hypertension: Blood pressure was 122/71 today in the office.  History of pityriasis rosea  Orders: Orders Placed This Encounter  Procedures   XR KNEE 3 VIEW RIGHT   XR KNEE 3 VIEW LEFT   CBC with Differential/Platelet   COMPLETE METABOLIC PANEL WITH GFR   VITAMIN D 25 Hydroxy (Vit-D Deficiency, Fractures)   No orders of the defined types were placed in this encounter.  Follow-Up Instructions: Return in about 5 months (around 12/01/2023) for Rheumatoid arthritis, Osteoarthritis.   Gearldine Bienenstock, PA-C  Note - This record has been created using Dragon software.  Chart creation errors have been sought, but may not always  have been located. Such creation errors do not reflect on  the standard of medical care.

## 2023-06-18 ENCOUNTER — Ambulatory Visit: Payer: Medicare PPO | Admitting: Physical Therapy

## 2023-06-18 DIAGNOSIS — R2681 Unsteadiness on feet: Secondary | ICD-10-CM

## 2023-06-18 DIAGNOSIS — M6281 Muscle weakness (generalized): Secondary | ICD-10-CM | POA: Diagnosis not present

## 2023-06-18 NOTE — Therapy (Signed)
OUTPATIENT PHYSICAL THERAPY NEURO TREATMENT   Patient Name: Melissa Perez MRN: [id number] DOB:December 05, 1945, 77 y.o., female Today's Date: 06/18/2023   PCP: Renaye Rakers, MD  REFERRING PROVIDER: Renaye Rakers, MD   END OF SESSION:  PT End of Session - 06/18/23 0849     Visit Number 3    Number of Visits 7    PT Start Time 0847    PT Stop Time 0928    PT Time Calculation (min) 41 min    Equipment Utilized During Treatment Gait belt    Activity Tolerance Patient tolerated treatment well    Behavior During Therapy WFL for tasks assessed/performed               Past Medical History:  Diagnosis Date   Arthritis    rheumatoid, take Humira   Arthritis    rheumatoid   Arthritis pain    Back pain    Breast cancer (HCC)    Cancer (HCC)    History of dental surgery    Hyperlipidemia    Hypertension    Personal history of radiation therapy    Rash    Rheumatoid arthritis (HCC)    S/P lumpectomy of breast 12/03/2011   Past Surgical History:  Procedure Laterality Date   ABDOMINAL HYSTERECTOMY     APPENDECTOMY     BREAST EXCISIONAL BIOPSY Left 1991   benign   BREAST LUMPECTOMY Right 11/2011   BREAST SURGERY     2 left breast biopsies-benign   left breast biopsies  1991 & 1990   Patient Active Problem List   Diagnosis Date Noted   Fracture, Colles, right, closed 11/16/2020   Pruritus of scalp 06/20/2018   DDD (degenerative disc disease), lumbar 05/27/2018   Essential hypertension 03/19/2017   History of pityriasis rosea 03/19/2017   Chronic right SI joint pain 03/19/2017   History of malignant neoplasm of breast 03/19/2017   Eczema 03/05/2017   Vulvar dermatitis 03/05/2017   Rheumatoid arthritis  10/15/2016   High risk medication use 10/15/2016   Morton's metatarsalgia 10/15/2016   Positive PPD, 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   S/P lumpectomy of breast 12/03/2011   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HCC) 11/01/2011    ONSET DATE: 05/15/2023    REFERRING DIAG: R26.9 (ICD-10-CM) - Gait abnormality R26.89 (ICD-10-CM) - Balance disorder    THERAPY DIAG:  Unsteadiness on feet  Muscle weakness (generalized)  Rationale for Evaluation and Treatment: Rehabilitation  SUBJECTIVE:  SUBJECTIVE STATEMENT: Pt reports she was really busy over the past week due to having a family reunion, was not able to do her HEP as much as she wanted to. Pt reports she does feel like her stability is improving. Pt having some aggravation of her R wrist due to doing lots of gardening, etc. Pt has been using Voltarin, Tylenol, and wearing her brace and that has helped with her pain.  Pt accompanied by: self  PERTINENT HISTORY: PMH: RA, hx of breast cancer,R malignant neoplasm of breast with radiation, HLD, HTN, osteoporosis  PAIN:  Are you having pain? Reports 3/10 pain in bilat hips, probably more soreness than pain   PRECAUTIONS: Fall  WEIGHT BEARING RESTRICTIONS: No  FALLS: Has patient fallen in last 6 months? No  LIVING ENVIRONMENT: Lives with: lives alone Lives in: House/apartment Stairs: Yes: External: 3 steps; none, 7 or 8 steps in the back with a railing  Has following equipment at home: None  PLOF: Independent and Leisure: Working out in the yard   PATIENT GOALS: Wants to get back some stability, does not want to stagger anymore   OBJECTIVE:   COGNITION: Overall cognitive status: Within functional limits for tasks assessed   SENSATION: WFL   POSTURE: No Significant postural limitations  LOWER EXTREMITY MMT:    MMT Right Eval Left Eval  Hip flexion 4 4+  Hip extension    Hip abduction 5 5  Hip adduction 5 5  Hip internal rotation    Hip external rotation    Knee flexion 5 5  Knee extension 4+ 5  Ankle dorsiflexion 5 5  Ankle  plantarflexion    Ankle inversion    Ankle eversion    (Blank rows = not tested)  All tested in sitting    TODAY'S TREATMENT:                                                                                                                              TherEx Resisted sit to stands 3 x 10 reps with green band  TherAct Static standing balance in corner with no UE support and CGA: Romberg stance on airex with EC 3 x 30 sec each Focus on equal WB through BLE Decreased sway as task progresses L/R modified tandem stance on airex with EC 3 x 30 sec each Added in horizontal and vertical head turns 2 x 30 sec each  Tandem gait along counter 6 x 10 ft with no UE support and SBA  Reviewed balance exercises from previous HEP and updated/revised, see bolded below  PATIENT EDUCATION: Education details: reviewed balance exercises on previous HEP and added to HEP Person educated: Patient Education method: Programmer, multimedia, Facilities manager, Actor cues, Verbal cues, and Handouts Education comprehension: verbalized understanding, returned demonstration, and needs further education  HOME EXERCISE PROGRAM: Access Code: [email or web address] From previous bout of therapy - will update review as appropriate    Access Code: [email or web address] URL: [email or web address] Date: 06/18/2023 Prepared  by: Peter Congo  Exercises - Supine Bridge with Mini Swiss Ball Between Knees  - 1 x daily - 7 x weekly - 2 sets - 10 reps - Supine Lower Trunk Rotation  - 1 x daily - 7 x weekly - 1 sets - 10 reps - Sit to Stand Without Arm Support  - 1 x daily - 7 x weekly - 3 sets - 10 reps - Narrow Base of Support Standing Balance Eyes Closed on Foam Pad  - 1 x daily - 7 x weekly - 2 sets - 10 reps - Half Tandem Stance with Head Rotation on Foam Pad  - 1 x daily - 7 x weekly - 2 sets - 10 reps - Half Tandem Stance with Head Nods on Foam Pad  - 1 x daily - 7 x weekly - 2 sets - 10 reps - Tandem Walking with Counter Support  - 1  x daily - 7 x weekly - 3 sets - 10 reps - Bridge with Abduction and Resistance Loop  - 1 x daily - 7 x weekly - 2 sets - 10 reps - Marching with Resistance  - 1 x daily - 7 x weekly - 4 sets - 10 reps - Seated Piriformis Stretch  - 1 x daily - 7 x weekly - 1 sets - 3 reps - 30 hold - Seated Flexion Stretch with Swiss Ball  - 1 x daily - 7 x weekly - 2 sets - 10 reps - 5 seconds hold  Bolded = reviewed with patient from previous POC  GOALS: Goals reviewed with patient? Yes  SHORT TERM GOALS: ALL STGS = LTGS  LONG TERM GOALS: Target date: 07/02/2023  Pt will be independent with final HEP for strength, gait, balance in order to build upon functional gains made in therapy. Baseline:  Goal status: INITIAL  2.  Pt will improve 30 second chair stand to at least 9 sit<> stands in order to demo improvement for age related norms/improved endurance.  Baseline: 7 sit <> stands, no UE support  Goal status: INITIAL  3.  Pt will improve RLE SLS time to at least 15 seconds for improved stability for functional tasks.  Baseline: R SLS: 12.4 seconds (Trendelenburg noted with incr postural sway) Goal status: INITIAL  4.  Pt will improve ABC scale to at least a 69% in order to demo improved self confidence with balance tasks  Baseline:63.75% self confidence   Goal status: INITIAL  ASSESSMENT:  CLINICAL IMPRESSION: Emphasis of skilled PT session on reviewing balance exercises from prior HEP and revising HEP so that pt performing most appropriate exercises for her current skill level. Pt exhibits most challenge with narrow BOS, on compliant surfaces, and with horizontal and vertical head turns. Pt continues to benefit from skilled therapy services to work towards LTGs. Continue POC.    OBJECTIVE IMPAIRMENTS: Abnormal gait, decreased activity tolerance, decreased balance, decreased strength, and pain.   ACTIVITY LIMITATIONS: bending, stairs, and locomotion level  PARTICIPATION LIMITATIONS:  community activity and gardening   PERSONAL FACTORS: Age, Behavior pattern, Past/current experiences, Time since onset of injury/illness/exacerbation, and 3+ comorbidities: RA, hx of breast cancer,R malignant neoplasm of breast with radiation, HLD, HTN, osteoporosis  are also affecting patient's functional outcome.   REHAB POTENTIAL: Good  CLINICAL DECISION MAKING: Stable/uncomplicated  EVALUATION COMPLEXITY: Low  PLAN:  PT FREQUENCY: 1-2x/week  PT DURATION: 4 weeks  PLANNED INTERVENTIONS: Therapeutic exercises, Therapeutic activity, Neuromuscular re-education, Balance training, Gait training, Patient/Family education, Self Care, Joint  mobilization, Stair training, DME instructions, Manual therapy, and Re-evaluation  PLAN FOR NEXT SESSION: continue to review previous HEP and update as appropriate, work on functional BLE strength, balance with unlevel surfaces, SLS, sit <> stands (can add resistance)  Peter Congo, PT, DPT, CSRS  06/18/23 9:28 AM

## 2023-06-20 ENCOUNTER — Ambulatory Visit: Payer: Medicare PPO | Admitting: Physical Therapy

## 2023-06-20 ENCOUNTER — Encounter: Payer: Self-pay | Admitting: Physical Therapy

## 2023-06-20 DIAGNOSIS — M6281 Muscle weakness (generalized): Secondary | ICD-10-CM | POA: Diagnosis not present

## 2023-06-20 DIAGNOSIS — R2681 Unsteadiness on feet: Secondary | ICD-10-CM

## 2023-06-20 NOTE — Therapy (Signed)
OUTPATIENT PHYSICAL THERAPY NEURO TREATMENT   Patient Name: Melissa Perez MRN: [id number] DOB:22-Dec-1945, 77 y.o., female Today's Date: 06/20/2023   PCP: Renaye Rakers, MD  REFERRING PROVIDER: Renaye Rakers, MD   END OF SESSION:  PT End of Session - 06/20/23 1018     Visit Number 4    Number of Visits 7    Date for PT Re-Evaluation 07/02/23    Authorization Type [employer] - 7 PT appts approved 06/04/23 - 07/02/23    PT Start Time 1017    PT Stop Time 1058    PT Time Calculation (min) 41 min    Equipment Utilized During Treatment Gait belt    Activity Tolerance Patient tolerated treatment well    Behavior During Therapy WFL for tasks assessed/performed               Past Medical History:  Diagnosis Date   Arthritis    rheumatoid, take Humira   Arthritis    rheumatoid   Arthritis pain    Back pain    Breast cancer (HCC)    Cancer (HCC)    History of dental surgery    Hyperlipidemia    Hypertension    Personal history of radiation therapy    Rash    Rheumatoid arthritis (HCC)    S/P lumpectomy of breast 12/03/2011   Past Surgical History:  Procedure Laterality Date   ABDOMINAL HYSTERECTOMY     APPENDECTOMY     BREAST EXCISIONAL BIOPSY Left 1991   benign   BREAST LUMPECTOMY Right 11/2011   BREAST SURGERY     2 left breast biopsies-benign   left breast biopsies  1991 & 1990   Patient Active Problem List   Diagnosis Date Noted   Fracture, Colles, right, closed 11/16/2020   Pruritus of scalp 06/20/2018   DDD (degenerative disc disease), lumbar 05/27/2018   Essential hypertension 03/19/2017   History of pityriasis rosea 03/19/2017   Chronic right SI joint pain 03/19/2017   History of malignant neoplasm of breast 03/19/2017   Eczema 03/05/2017   Vulvar dermatitis 03/05/2017   Rheumatoid arthritis  10/15/2016   High risk medication use 10/15/2016   Morton's metatarsalgia 10/15/2016   Positive PPD, 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   S/P lumpectomy of breast  12/03/2011   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HCC) 11/01/2011    ONSET DATE: 05/15/2023   REFERRING DIAG: R26.9 (ICD-10-CM) - Gait abnormality R26.89 (ICD-10-CM) - Balance disorder    THERAPY DIAG:  Unsteadiness on feet  Muscle weakness (generalized)  Rationale for Evaluation and Treatment: Rehabilitation  SUBJECTIVE:  SUBJECTIVE STATEMENT: Getting back to her exercises and things are going well. Sees the rheumatologist on the 29th for her R knee. No questions about exercises, just has to do her part. Wants to keep working on standing on the foam.  Pt accompanied by: self  PERTINENT HISTORY: PMH: RA, hx of breast cancer,R malignant neoplasm of breast with radiation, HLD, HTN, osteoporosis  PAIN:  Are you having pain? Reports 3/10 pain in bilat hips, probably more soreness than pain   PRECAUTIONS: Fall  WEIGHT BEARING RESTRICTIONS: No  FALLS: Has patient fallen in last 6 months? No  LIVING ENVIRONMENT: Lives with: lives alone Lives in: House/apartment Stairs: Yes: External: 3 steps; none, 7 or 8 steps in the back with a railing  Has following equipment at home: None  PLOF: Independent and Leisure: Working out in the yard   PATIENT GOALS: Wants to get back some stability, does not want to stagger anymore   OBJECTIVE:   COGNITION: Overall cognitive status: Within functional limits for tasks assessed   SENSATION: WFL   POSTURE: No Significant postural limitations  LOWER EXTREMITY MMT:    MMT Right Eval Left Eval  Hip flexion 4 4+  Hip extension    Hip abduction 5 5  Hip adduction 5 5  Hip internal rotation    Hip external rotation    Knee flexion 5 5  Knee extension 4+ 5  Ankle dorsiflexion 5 5  Ankle plantarflexion    Ankle inversion     Ankle eversion    (Blank rows = not tested)  All tested in sitting    TODAY'S TREATMENT:                                                                                                                               NMR: 6 blaze pods set up on 4" steps and 6" step on random tap setting for reaction times, SLS stability, weight shifting:  Performed 4 bouts of 1 minute with 30 second rest break; 24 hits, 26 hits, 26 hits, 26 hits   On air ex with feet apart > feet together holding 4# ball and throwing it to ground and then catching it in front of pt for reactive balance and then tossing it to R/L side, performed approx 15-20 reps, mild postural sway with feet together  On blue foam beam: Tandem gait down and back x3 reps with no UE support Side stepping down and back x3 reps with focus on incr step height for dynamic SLS, incr unsteadiness at times   On rockerboard in A/P direction: EO: x10 reps head turns, x10 reps head nods EC: 2 x 5 reps head turns, 2 x 5 reps head nods, incr unsteadiness with taps to bars for balance     PATIENT EDUCATION: Education details: Continue HEP Person educated: Patient Education method: Explanation, Demonstration, Tactile cues, and Verbal cues Education comprehension: verbalized understanding, returned demonstration, and needs further education  HOME  EXERCISE PROGRAM: Access Code: [email or web address] From previous bout of therapy - will update review as appropriate    Access Code: [email or web address] URL: [email or web address] Date: 06/18/2023 Prepared by: Peter Congo  Exercises - Supine Bridge with Mini Swiss Ball Between Knees  - 1 x daily - 7 x weekly - 2 sets - 10 reps - Supine Lower Trunk Rotation  - 1 x daily - 7 x weekly - 1 sets - 10 reps - Sit to Stand Without Arm Support  - 1 x daily - 7 x weekly - 3 sets - 10 reps - Narrow Base of Support Standing Balance Eyes Closed on Foam Pad  - 1 x daily - 7 x weekly - 2 sets - 10 reps - Half Tandem  Stance with Head Rotation on Foam Pad  - 1 x daily - 7 x weekly - 2 sets - 10 reps - Half Tandem Stance with Head Nods on Foam Pad  - 1 x daily - 7 x weekly - 2 sets - 10 reps - Tandem Walking with Counter Support  - 1 x daily - 7 x weekly - 3 sets - 10 reps - Bridge with Abduction and Resistance Loop  - 1 x daily - 7 x weekly - 2 sets - 10 reps - Marching with Resistance  - 1 x daily - 7 x weekly - 4 sets - 10 reps - Seated Piriformis Stretch  - 1 x daily - 7 x weekly - 1 sets - 3 reps - 30 hold - Seated Flexion Stretch with Swiss Ball  - 1 x daily - 7 x weekly - 2 sets - 10 reps - 5 seconds hold  Bolded = reviewed with patient from previous POC  GOALS: Goals reviewed with patient? Yes  SHORT TERM GOALS: ALL STGS = LTGS  LONG TERM GOALS: Target date: 07/02/2023  Pt will be independent with final HEP for strength, gait, balance in order to build upon functional gains made in therapy. Baseline:  Goal status: INITIAL  2.  Pt will improve 30 second chair stand to at least 9 sit<> stands in order to demo improvement for age related norms/improved endurance.  Baseline: 7 sit <> stands, no UE support  Goal status: INITIAL  3.  Pt will improve RLE SLS time to at least 15 seconds for improved stability for functional tasks.  Baseline: R SLS: 12.4 seconds (Trendelenburg noted with incr postural sway) Goal status: INITIAL  4.  Pt will improve ABC scale to at least a 69% in order to demo improved self confidence with balance tasks  Baseline:63.75% self confidence   Goal status: INITIAL  ASSESSMENT:  CLINICAL IMPRESSION: Today's skilled session continued to focus on balance strategies on compliant surfaces, narrow BOS, SLS tasks, and head motions. Pt challenged with SLS tasks on unlevel surfaces and EC with head motions for vestibular input. Pt tolerated session well and is progressing well. Will continue per POC.    OBJECTIVE IMPAIRMENTS: Abnormal gait, decreased activity tolerance,  decreased balance, decreased strength, and pain.   ACTIVITY LIMITATIONS: bending, stairs, and locomotion level  PARTICIPATION LIMITATIONS: community activity and gardening   PERSONAL FACTORS: Age, Behavior pattern, Past/current experiences, Time since onset of injury/illness/exacerbation, and 3+ comorbidities: RA, hx of breast cancer,R malignant neoplasm of breast with radiation, HLD, HTN, osteoporosis  are also affecting patient's functional outcome.   REHAB POTENTIAL: Good  CLINICAL DECISION MAKING: Stable/uncomplicated  EVALUATION COMPLEXITY: Low  PLAN:  PT FREQUENCY: 1-2x/week  PT DURATION: 4 weeks  PLANNED  INTERVENTIONS: Therapeutic exercises, Therapeutic activity, Neuromuscular re-education, Balance training, Gait training, Patient/Family education, Self Care, Joint mobilization, Stair training, DME instructions, Manual therapy, and Re-evaluation  PLAN FOR NEXT SESSION: continue to review previous HEP and update as appropriate, work on functional BLE strength, balance with unlevel surfaces, SLS, try resisted gait, EC tasks.   Sherlie Ban, PT, DPT 06/20/23 11:00 AM

## 2023-06-25 ENCOUNTER — Ambulatory Visit: Payer: Medicare PPO | Admitting: Physical Therapy

## 2023-06-25 DIAGNOSIS — M6281 Muscle weakness (generalized): Secondary | ICD-10-CM

## 2023-06-25 DIAGNOSIS — R2681 Unsteadiness on feet: Secondary | ICD-10-CM

## 2023-06-25 NOTE — Therapy (Signed)
OUTPATIENT PHYSICAL THERAPY NEURO TREATMENT   Patient Name: Melissa Perez MRN: [id number] DOB:1946/01/06, 77 y.o., female Today's Date: 06/25/2023   PCP: Renaye Rakers, MD  REFERRING PROVIDER: Renaye Rakers, MD   END OF SESSION:  PT End of Session - 06/25/23 0846     Visit Number 5    Number of Visits 7    Date for PT Re-Evaluation 07/02/23    Authorization Type [employer] - 7 PT appts approved 06/04/23 - 07/02/23    PT Start Time 0845    PT Stop Time 0924    PT Time Calculation (min) 39 min    Equipment Utilized During Treatment Gait belt    Activity Tolerance Patient tolerated treatment well    Behavior During Therapy WFL for tasks assessed/performed                Past Medical History:  Diagnosis Date   Arthritis    rheumatoid, take Humira   Arthritis    rheumatoid   Arthritis pain    Back pain    Breast cancer (HCC)    Cancer (HCC)    History of dental surgery    Hyperlipidemia    Hypertension    Personal history of radiation therapy    Rash    Rheumatoid arthritis (HCC)    S/P lumpectomy of breast 12/03/2011   Past Surgical History:  Procedure Laterality Date   ABDOMINAL HYSTERECTOMY     APPENDECTOMY     BREAST EXCISIONAL BIOPSY Left 1991   benign   BREAST LUMPECTOMY Right 11/2011   BREAST SURGERY     2 left breast biopsies-benign   left breast biopsies  1991 & 1990   Patient Active Problem List   Diagnosis Date Noted   Fracture, Colles, right, closed 11/16/2020   Pruritus of scalp 06/20/2018   DDD (degenerative disc disease), lumbar 05/27/2018   Essential hypertension 03/19/2017   History of pityriasis rosea 03/19/2017   Chronic right SI joint pain 03/19/2017   History of malignant neoplasm of breast 03/19/2017   Eczema 03/05/2017   Vulvar dermatitis 03/05/2017   Rheumatoid arthritis  10/15/2016   High risk medication use 10/15/2016   Morton's metatarsalgia 10/15/2016   Positive PPD, 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   S/P lumpectomy of breast  12/03/2011   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HCC) 11/01/2011    ONSET DATE: 05/15/2023   REFERRING DIAG: R26.9 (ICD-10-CM) - Gait abnormality R26.89 (ICD-10-CM) - Balance disorder    THERAPY DIAG:  Unsteadiness on feet  Muscle weakness (generalized)  Rationale for Evaluation and Treatment: Rehabilitation  SUBJECTIVE:  SUBJECTIVE STATEMENT: Pt reports she has been working on her HEP (balance exercises with foam and tandem exercises remain challenging). Pt reports no falls and some ongoing R knee and R wrist pain/aggravation.  Pt accompanied by: self  PERTINENT HISTORY: PMH: RA, hx of breast cancer,R malignant neoplasm of breast with radiation, HLD, HTN, osteoporosis  PAIN:  Are you having pain? Reports 3/10 pain in bilat hips, probably more soreness than pain   PRECAUTIONS: Fall  WEIGHT BEARING RESTRICTIONS: No  FALLS: Has patient fallen in last 6 months? No  LIVING ENVIRONMENT: Lives with: lives alone Lives in: House/apartment Stairs: Yes: External: 3 steps; none, 7 or 8 steps in the back with a railing  Has following equipment at home: None  PLOF: Independent and Leisure: Working out in the yard   PATIENT GOALS: Wants to get back some stability, does not want to stagger anymore   OBJECTIVE:   COGNITION: Overall cognitive status: Within functional limits for tasks assessed   SENSATION: WFL   POSTURE: No Significant postural limitations  LOWER EXTREMITY MMT:    MMT Right Eval Left Eval  Hip flexion 4 4+  Hip extension    Hip abduction 5 5  Hip adduction 5 5  Hip internal rotation    Hip external rotation    Knee flexion 5 5  Knee extension 4+ 5  Ankle dorsiflexion 5 5  Ankle plantarflexion    Ankle inversion    Ankle eversion     (Blank rows = not tested)  All tested in sitting    TODAY'S TREATMENT:                                                                                                                               NMR/TherAct: 5 Blaze pods on random setting for improved dynamic balance.  Performed on 1 minute intervals with 30 rest periods.  Pt requires SBA guarding. With obstacle course in // bars involving compliant surface and stepping over foam beams. Round 1:  obstacle course setup.  11 hits. Round 2:  obstacle course  setup.  10 hits. Round 3:  obstacle course  setup.  13 hits. Decreased speed of movements initially. Round 1:  obstacle course  setup.  10 hits. Round 2:  obstacle course  setup.  14 hits. Round 3:  obstacle course  setup.  13 hits. Noted to have increased speed and confidence with task during 2nd set.  Resisted gait with green band with focus on multidirectional perturbations and use of stepping strategy to recover balance vs cross-over stepping. Pt exhibits several instances of cross-over stepping with M/L perturbations but overall utilizes stepping strategy.  Static stance on Bosu 4 x 30 sec with no UE support and close SBA to CGA    PATIENT EDUCATION: Education details: Continue HEP Person educated: Patient Education method: Explanation, Demonstration, Tactile cues, and Verbal cues Education comprehension: verbalized understanding, returned demonstration, and needs further education  HOME EXERCISE  PROGRAM: Access Code: [email or web address] From previous bout of therapy - will update review as appropriate    Access Code: [email or web address] URL: [email or web address] Date: 06/18/2023 Prepared by: Peter Congo  Exercises - Supine Bridge with Mini Swiss Ball Between Knees  - 1 x daily - 7 x weekly - 2 sets - 10 reps - Supine Lower Trunk Rotation  - 1 x daily - 7 x weekly - 1 sets - 10 reps - Sit to Stand Without Arm Support  - 1 x daily - 7 x weekly - 3 sets - 10 reps -  Narrow Base of Support Standing Balance Eyes Closed on Foam Pad  - 1 x daily - 7 x weekly - 2 sets - 10 reps - Half Tandem Stance with Head Rotation on Foam Pad  - 1 x daily - 7 x weekly - 2 sets - 10 reps - Half Tandem Stance with Head Nods on Foam Pad  - 1 x daily - 7 x weekly - 2 sets - 10 reps - Tandem Walking with Counter Support  - 1 x daily - 7 x weekly - 3 sets - 10 reps - Bridge with Abduction and Resistance Loop  - 1 x daily - 7 x weekly - 2 sets - 10 reps - Marching with Resistance  - 1 x daily - 7 x weekly - 4 sets - 10 reps - Seated Piriformis Stretch  - 1 x daily - 7 x weekly - 1 sets - 3 reps - 30 hold - Seated Flexion Stretch with Swiss Ball  - 1 x daily - 7 x weekly - 2 sets - 10 reps - 5 seconds hold  Bolded = reviewed with patient from previous POC  GOALS: Goals reviewed with patient? Yes  SHORT TERM GOALS: ALL STGS = LTGS  LONG TERM GOALS: Target date: 07/02/2023  Pt will be independent with final HEP for strength, gait, balance in order to build upon functional gains made in therapy. Baseline:  Goal status: INITIAL  2.  Pt will improve 30 second chair stand to at least 9 sit<> stands in order to demo improvement for age related norms/improved endurance.  Baseline: 7 sit <> stands, no UE support  Goal status: INITIAL  3.  Pt will improve RLE SLS time to at least 15 seconds for improved stability for functional tasks.  Baseline: R SLS: 12.4 seconds (Trendelenburg noted with incr postural sway) Goal status: INITIAL  4.  Pt will improve ABC scale to at least a 69% in order to demo improved self confidence with balance tasks  Baseline:63.75% self confidence   Goal status: INITIAL  ASSESSMENT:  CLINICAL IMPRESSION: Emphasis of skilled PT session on continuing to work on static and dynamic standing balance. Pt exhibits occasional use of cross over stepping strategy for balance recover but does exhibit increased use of stepping strategy with verbal cues and feedback.  Pt also remains challenged by stance on compliant surfaces, SLS, and with stepping over obstacles. Pt continues to benefit from skilled therapy services to work towards improving her overall balance in order to decrease her fall risk. Continue POC.    OBJECTIVE IMPAIRMENTS: Abnormal gait, decreased activity tolerance, decreased balance, decreased strength, and pain.   ACTIVITY LIMITATIONS: bending, stairs, and locomotion level  PARTICIPATION LIMITATIONS: community activity and gardening   PERSONAL FACTORS: Age, Behavior pattern, Past/current experiences, Time since onset of injury/illness/exacerbation, and 3+ comorbidities: RA, hx of breast cancer,R malignant neoplasm of breast with radiation, HLD, HTN, osteoporosis  are also  affecting patient's functional outcome.   REHAB POTENTIAL: Good  CLINICAL DECISION MAKING: Stable/uncomplicated  EVALUATION COMPLEXITY: Low  PLAN:  PT FREQUENCY: 1-2x/week  PT DURATION: 4 weeks  PLANNED INTERVENTIONS: Therapeutic exercises, Therapeutic activity, Neuromuscular re-education, Balance training, Gait training, Patient/Family education, Self Care, Joint mobilization, Stair training, DME instructions, Manual therapy, and Re-evaluation  PLAN FOR NEXT SESSION:  work on functional BLE strength, balance with unlevel surfaces, SLS, resisted gait, EC tasks, ball toss on Bosu, Bosu squats, rebounder on airex, resisted step taps, airex Blaze pod taps  Peter Congo, PT, DPT, CSRS  06/25/23 9:25 AM

## 2023-07-01 ENCOUNTER — Ambulatory Visit: Payer: Medicare PPO

## 2023-07-01 ENCOUNTER — Encounter: Payer: Self-pay | Admitting: Physician Assistant

## 2023-07-01 ENCOUNTER — Telehealth: Payer: Self-pay

## 2023-07-01 ENCOUNTER — Ambulatory Visit (INDEPENDENT_AMBULATORY_CARE_PROVIDER_SITE_OTHER): Payer: Medicare PPO

## 2023-07-01 ENCOUNTER — Ambulatory Visit: Payer: Medicare PPO | Attending: Physician Assistant | Admitting: Physician Assistant

## 2023-07-01 VITALS — BP 122/71 | HR 78 | Resp 14 | Ht 61.0 in | Wt 136.2 lb

## 2023-07-01 DIAGNOSIS — M810 Age-related osteoporosis without current pathological fracture: Secondary | ICD-10-CM | POA: Diagnosis not present

## 2023-07-01 DIAGNOSIS — G8929 Other chronic pain: Secondary | ICD-10-CM

## 2023-07-01 DIAGNOSIS — M533 Sacrococcygeal disorders, not elsewhere classified: Secondary | ICD-10-CM

## 2023-07-01 DIAGNOSIS — M5136 Other intervertebral disc degeneration, lumbar region: Secondary | ICD-10-CM

## 2023-07-01 DIAGNOSIS — M0579 Rheumatoid arthritis with rheumatoid factor of multiple sites without organ or systems involvement: Secondary | ICD-10-CM

## 2023-07-01 DIAGNOSIS — M170 Bilateral primary osteoarthritis of knee: Secondary | ICD-10-CM

## 2023-07-01 DIAGNOSIS — Z872 Personal history of diseases of the skin and subcutaneous tissue: Secondary | ICD-10-CM

## 2023-07-01 DIAGNOSIS — M25561 Pain in right knee: Secondary | ICD-10-CM

## 2023-07-01 DIAGNOSIS — Z5181 Encounter for therapeutic drug level monitoring: Secondary | ICD-10-CM

## 2023-07-01 DIAGNOSIS — Z853 Personal history of malignant neoplasm of breast: Secondary | ICD-10-CM

## 2023-07-01 DIAGNOSIS — M25562 Pain in left knee: Secondary | ICD-10-CM

## 2023-07-01 DIAGNOSIS — Z79899 Other long term (current) drug therapy: Secondary | ICD-10-CM | POA: Diagnosis not present

## 2023-07-01 DIAGNOSIS — E559 Vitamin D deficiency, unspecified: Secondary | ICD-10-CM

## 2023-07-01 DIAGNOSIS — R7611 Nonspecific reaction to tuberculin skin test without active tuberculosis: Secondary | ICD-10-CM

## 2023-07-01 DIAGNOSIS — Z8679 Personal history of other diseases of the circulatory system: Secondary | ICD-10-CM

## 2023-07-01 NOTE — Telephone Encounter (Signed)
VOB submitted for Orthovisc, Bilateral knee(s) BV pending 

## 2023-07-01 NOTE — Telephone Encounter (Signed)
Please apply for bilateral knee visco, per Taylor Dale, PA-C. Thanks!  

## 2023-07-01 NOTE — Patient Instructions (Signed)
Standing Labs We placed an order today for your standing lab work.   Please have your standing labs drawn in September and every 3 months   Please have your labs drawn 2 weeks prior to your appointment so that the provider can discuss your lab results at your appointment, if possible.  Please note that you may see your imaging and lab results in MyChart before we have reviewed them. We will contact you once all results are reviewed. Please allow our office up to 72 hours to thoroughly review all of the results before contacting the office for clarification of your results.  WALK-IN LAB HOURS  Monday through Thursday from 8:00 am -12:30 pm and 1:00 pm-5:00 pm and Friday from 8:00 am-12:00 pm.  Patients with office visits requiring labs will be seen before walk-in labs.  You may encounter longer than normal wait times. Please allow additional time. Wait times may be shorter on  Monday and Thursday afternoons.  We do not book appointments for walk-in labs. We appreciate your patience and understanding with our staff.   Labs are drawn by [facility]. Please bring your co-pay at the time of your lab draw.  You may receive a bill from [facility] for your lab work.  Please note if you are on Hydroxychloroquine and and an order has been placed for a Hydroxychloroquine level,  you will need to have it drawn 4 hours or more after your last dose.  If you wish to have your labs drawn at another location, please call the office 24 hours in advance so we can fax the orders.  The office is located at [address], [address], [place], [place] [address]   If you have any questions regarding directions or hours of operation,  please call [phone].   As a reminder, please drink plenty of water prior to coming for your lab work. Thanks!  

## 2023-07-01 NOTE — Telephone Encounter (Signed)
Please call to schedule visco injections.  Approved for Orthovisc, Bilateral knee(s). Buy & Bill $35 co-pay Once the OOP has been met ($3300/$109.28) patient is covered at 100% Deductible does not apply No pre-certifications Reference #[id number]

## 2023-07-02 ENCOUNTER — Ambulatory Visit: Payer: Medicare PPO | Admitting: Physical Therapy

## 2023-07-02 DIAGNOSIS — R2681 Unsteadiness on feet: Secondary | ICD-10-CM

## 2023-07-02 DIAGNOSIS — M6281 Muscle weakness (generalized): Secondary | ICD-10-CM | POA: Diagnosis not present

## 2023-07-02 NOTE — Therapy (Signed)
OUTPATIENT PHYSICAL THERAPY NEURO TREATMENT - RECERTIFICATION   Patient Name: Melissa Perez MRN: [id number] DOB:July 23, 1946, 77 y.o., female Today's Date: 07/02/2023   PCP: Renaye Rakers, MD  REFERRING PROVIDER: Renaye Rakers, MD   END OF SESSION:  PT End of Session - 07/02/23 0843     Visit Number 6    Number of Visits 7    Date for PT Re-Evaluation 07/05/23   recert   Authorization Type [employer] - 7 PT appts approved 06/04/23 - 07/02/23    PT Start Time 0842    PT Stop Time 0925    PT Time Calculation (min) 43 min    Equipment Utilized During Treatment Gait belt    Activity Tolerance Patient tolerated treatment well    Behavior During Therapy WFL for tasks assessed/performed                 Past Medical History:  Diagnosis Date   Arthritis    rheumatoid, take Humira   Arthritis    rheumatoid   Arthritis pain    Back pain    Breast cancer (HCC)    Cancer (HCC)    History of dental surgery    Hyperlipidemia    Hypertension    Personal history of radiation therapy    Rash    Rheumatoid arthritis (HCC)    S/P lumpectomy of breast 12/03/2011   Past Surgical History:  Procedure Laterality Date   ABDOMINAL HYSTERECTOMY     APPENDECTOMY     BREAST EXCISIONAL BIOPSY Left 1991   benign   BREAST LUMPECTOMY Right 11/2011   BREAST SURGERY     2 left breast biopsies-benign   left breast biopsies  1991 & 1990   Patient Active Problem List   Diagnosis Date Noted   Fracture, Colles, right, closed 11/16/2020   Pruritus of scalp 06/20/2018   DDD (degenerative disc disease), lumbar 05/27/2018   Essential hypertension 03/19/2017   History of pityriasis rosea 03/19/2017   Chronic right SI joint pain 03/19/2017   History of malignant neoplasm of breast 03/19/2017   Eczema 03/05/2017   Vulvar dermatitis 03/05/2017   Rheumatoid arthritis  10/15/2016   High risk medication use 10/15/2016   Morton's metatarsalgia 10/15/2016   Positive PPD, 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   S/P  lumpectomy of breast 12/03/2011   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HCC) 11/01/2011    ONSET DATE: 05/15/2023   REFERRING DIAG: R26.9 (ICD-10-CM) - Gait abnormality R26.89 (ICD-10-CM) - Balance disorder    THERAPY DIAG:  Unsteadiness on feet  Muscle weakness (generalized)  Rationale for Evaluation and Treatment: Rehabilitation  SUBJECTIVE:  SUBJECTIVE STATEMENT: Pt reports she feels like her balance is doing better and she is incorporating things practiced in therapy sessions into her daily life. Pt did see her rheumatolgosit yesterday, OA is what is bothering her R knee, going to try to get to gel shots. Pt reports that R knee pain does not stop her but she is aware of it, R wrist pain flares up with repetitive movements. Pt walking 6000 steps/day.  Pt accompanied by: self  PERTINENT HISTORY: PMH: RA, hx of breast cancer,R malignant neoplasm of breast with radiation, HLD, HTN, osteoporosis  PAIN:  Are you having pain? Reports 3/10 pain in bilat hips, probably more soreness than pain   PRECAUTIONS: Fall  WEIGHT BEARING RESTRICTIONS: No  FALLS: Has patient fallen in last 6 months? No  LIVING ENVIRONMENT: Lives with: lives alone Lives in: House/apartment Stairs: Yes: External: 3 steps; none, 7 or 8 steps in the back with a railing  Has following equipment at home: None  PLOF: Independent and Leisure: Working out in the yard   PATIENT GOALS: Wants to get back some stability, does not want to stagger anymore   OBJECTIVE:   COGNITION: Overall cognitive status: Within functional limits for tasks assessed   SENSATION: WFL   POSTURE: No Significant postural limitations  LOWER EXTREMITY MMT:    MMT Right Eval Left Eval  Hip flexion 4 4+  Hip extension     Hip abduction 5 5  Hip adduction 5 5  Hip internal rotation    Hip external rotation    Knee flexion 5 5  Knee extension 4+ 5  Ankle dorsiflexion 5 5  Ankle plantarflexion    Ankle inversion    Ankle eversion    (Blank rows = not tested)  All tested in sitting    TODAY'S TREATMENT:                                                                                                                               NMR/TherAct: 30 sec sit to stand: 8 stands, no UE support  In // bars to work on balance strategies and LE strengthening: Mini-squats 3 x 10 reps with intermittent UE support Ball toss x 30 reps with no UE support Static stance on airex performing ball toss against rebounder with 1 kg weighted ball: Normal stance x 10 reps Romberg stance x 10 reps L/R modified tandem stance 2 x 10 reps each At bottom of stairs with one UE support Resisted 6" step taps 2 x 10 reps L/R with GTB In // bars with no UE support: Resisted retro gait 6 x 10 ft with blue band Resisted lateral sidestepping 3 x 10 ft each direction with blue band    PATIENT EDUCATION: Education details: Continue HEP, PT POC with plan to d/c next session Person educated: Patient Education method: Explanation, Demonstration, Tactile cues, and Verbal cues Education comprehension: verbalized understanding, returned demonstration, and needs further education  HOME  EXERCISE PROGRAM: Access Code: [email or web address] From previous bout of therapy - will update review as appropriate    Access Code: [email or web address] URL: [email or web address] Date: 06/18/2023 Prepared by: Peter Congo  Exercises - Supine Bridge with Mini Swiss Ball Between Knees  - 1 x daily - 7 x weekly - 2 sets - 10 reps - Supine Lower Trunk Rotation  - 1 x daily - 7 x weekly - 1 sets - 10 reps - Sit to Stand Without Arm Support  - 1 x daily - 7 x weekly - 3 sets - 10 reps - Narrow Base of Support Standing Balance Eyes Closed on Foam Pad  - 1 x  daily - 7 x weekly - 2 sets - 10 reps - Half Tandem Stance with Head Rotation on Foam Pad  - 1 x daily - 7 x weekly - 2 sets - 10 reps - Half Tandem Stance with Head Nods on Foam Pad  - 1 x daily - 7 x weekly - 2 sets - 10 reps - Tandem Walking with Counter Support  - 1 x daily - 7 x weekly - 3 sets - 10 reps - Bridge with Abduction and Resistance Loop  - 1 x daily - 7 x weekly - 2 sets - 10 reps - Marching with Resistance  - 1 x daily - 7 x weekly - 4 sets - 10 reps - Seated Piriformis Stretch  - 1 x daily - 7 x weekly - 1 sets - 3 reps - 30 hold - Seated Flexion Stretch with Swiss Ball  - 1 x daily - 7 x weekly - 2 sets - 10 reps - 5 seconds hold  Bolded = reviewed with patient from previous POC  GOALS: Goals reviewed with patient? Yes  SHORT TERM GOALS: ALL STGS = LTGS  LONG TERM GOALS: Target date: 07/02/2023  Pt will be independent with final HEP for strength, gait, balance in order to build upon functional gains made in therapy. Baseline:  Goal status: INITIAL  2.  Pt will improve 30 second chair stand to at least 9 sit<> stands in order to demo improvement for age related norms/improved endurance.  Baseline: 7 sit <> stands, no UE support, 8 sit to stands with no UE support (7/30)  Goal status: IN PROGRESS  3.  Pt will improve RLE SLS time to at least 15 seconds for improved stability for functional tasks.  Baseline: R SLS: 12.4 seconds (Trendelenburg noted with incr postural sway) Goal status: INITIAL  4.  Pt will improve ABC scale to at least a 69% in order to demo improved self confidence with balance tasks  Baseline:63.75% self confidence   Goal status: INITIAL  NEW SHORT TERM GOALS=LONG TERM GOALS due to length of POC   NEW LONG TERM GOALS:  Target date: 07/05/2023  Pt will be independent with final HEP for strength, gait, balance in order to build upon functional gains made in therapy. Baseline:  Goal status: INITIAL  2.  Pt will improve 30 second chair stand to  at least 9 sit<> stands in order to demo improvement for age related norms/improved endurance.  Baseline: 7 sit <> stands, no UE support, 8 sit to stands with no UE support (7/30)  Goal status: IN PROGRESS  3.  Pt will improve RLE SLS time to at least 15 seconds for improved stability for functional tasks.  Baseline: R SLS: 12.4 seconds (Trendelenburg noted with incr postural sway) Goal status: INITIAL  4.  Pt will improve  ABC scale to at least a 69% in order to demo improved self confidence with balance tasks  Baseline:63.75% self confidence   Goal status: INITIAL     ASSESSMENT:  CLINICAL IMPRESSION: Emphasis of skilled PT session on assessing one LTG and creating new LTG in preparation for recertification of PT services in order to cover last scheduled appointment. Pt has made progress towards 1/1 assessed goal this date due to improving her 30 sec sit to stand from 7 reps to 8 reps, did not quite meet goal of 9 reps yet, will reassess this goal as well as remaining goals during next session for d/c. Pt does well with increased resistance and balance challenges this session with no LOB, just needs seated rest breaks due to fatigue. Pt continues to benefit from skilled therapy services to work towards LTGs. Continue POC.    OBJECTIVE IMPAIRMENTS: Abnormal gait, decreased activity tolerance, decreased balance, decreased strength, and pain.   ACTIVITY LIMITATIONS: bending, stairs, and locomotion level  PARTICIPATION LIMITATIONS: community activity and gardening   PERSONAL FACTORS: Age, Behavior pattern, Past/current experiences, Time since onset of injury/illness/exacerbation, and 3+ comorbidities: RA, hx of breast cancer,R malignant neoplasm of breast with radiation, HLD, HTN, osteoporosis  are also affecting patient's functional outcome.   REHAB POTENTIAL: Good  CLINICAL DECISION MAKING: Stable/uncomplicated  EVALUATION COMPLEXITY: Low  PLAN:  PT FREQUENCY: 1-2x/week  PT  DURATION: 4 weeks + 1 visit  PLANNED INTERVENTIONS: Therapeutic exercises, Therapeutic activity, Neuromuscular re-education, Balance training, Gait training, Patient/Family education, Self Care, Joint mobilization, Stair training, DME instructions, Manual therapy, and Re-evaluation  PLAN FOR NEXT SESSION:  assess LTG and d/c  Peter Congo, PT, DPT, CSRS  07/02/23 9:27 AM

## 2023-07-02 NOTE — Progress Notes (Signed)
X-rays of the left knee are consistent with mild OA and mild chondromalacia patella--mild progression noted since 2022.   Please notify the patient.

## 2023-07-02 NOTE — Progress Notes (Signed)
X-rays of the right knee are consistent with moderate OA and moderate chondromalacia patella.  No progression noted since 2023. Please notify the patient.  Apply for visco for both knees as discussed yesterday

## 2023-07-03 NOTE — Telephone Encounter (Signed)
LMOM for patient to schedule injections

## 2023-07-04 ENCOUNTER — Ambulatory Visit: Payer: Medicare PPO | Attending: Family Medicine | Admitting: Physical Therapy

## 2023-07-04 DIAGNOSIS — M6281 Muscle weakness (generalized): Secondary | ICD-10-CM | POA: Diagnosis not present

## 2023-07-04 DIAGNOSIS — R2681 Unsteadiness on feet: Secondary | ICD-10-CM | POA: Diagnosis not present

## 2023-07-04 NOTE — Therapy (Signed)
OUTPATIENT PHYSICAL THERAPY NEURO TREATMENT - DISCHARGE NOTE   Patient Name: Melissa Perez MRN: [id number] DOB:Apr 07, 1946, 77 y.o., female Today's Date: 07/04/2023   PCP: Renaye Rakers, MD  REFERRING PROVIDER: Renaye Rakers, MD    PHYSICAL THERAPY DISCHARGE SUMMARY  Visits from Start of Care: 7  Current functional level related to goals / functional outcomes: Mod I   Remaining deficits: N/A   Education / Equipment: Handout for HEP   Patient agrees to discharge. Patient goals were met. Patient is being discharged due to meeting the stated rehab goals.   END OF SESSION:  PT End of Session - 07/04/23 0932     Visit Number 7    Number of Visits 7    Date for PT Re-Evaluation 07/05/23   recert   Authorization Type [employer] - 7 PT appts approved 06/04/23 - 07/02/23    PT Start Time 0932    PT Stop Time 0950   d/c   PT Time Calculation (min) 18 min    Equipment Utilized During Treatment Gait belt    Activity Tolerance Patient tolerated treatment well    Behavior During Therapy WFL for tasks assessed/performed                  Past Medical History:  Diagnosis Date   Arthritis    rheumatoid, take Humira   Arthritis    rheumatoid   Arthritis pain    Back pain    Breast cancer (HCC)    Cancer (HCC)    History of dental surgery    Hyperlipidemia    Hypertension    Personal history of radiation therapy    Rash    Rheumatoid arthritis (HCC)    S/P lumpectomy of breast 12/03/2011   Past Surgical History:  Procedure Laterality Date   ABDOMINAL HYSTERECTOMY     APPENDECTOMY     BREAST EXCISIONAL BIOPSY Left 1991   benign   BREAST LUMPECTOMY Right 11/2011   BREAST SURGERY     2 left breast biopsies-benign   left breast biopsies  1991 & 1990   Patient Active Problem List   Diagnosis Date Noted   Fracture, Colles, right, closed 11/16/2020   Pruritus of scalp 06/20/2018   DDD (degenerative disc disease), lumbar 05/27/2018   Essential hypertension  03/19/2017   History of pityriasis rosea 03/19/2017   Chronic right SI joint pain 03/19/2017   History of malignant neoplasm of breast 03/19/2017   Eczema 03/05/2017   Vulvar dermatitis 03/05/2017   Rheumatoid arthritis  10/15/2016   High risk medication use 10/15/2016   Morton's metatarsalgia 10/15/2016   Positive PPD, 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   S/P lumpectomy of breast 12/03/2011   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HCC) 11/01/2011    ONSET DATE: 05/15/2023   REFERRING DIAG: R26.9 (ICD-10-CM) - Gait abnormality R26.89 (ICD-10-CM) - Balance disorder    THERAPY DIAG:  Unsteadiness on feet  Muscle weakness (generalized)  Rationale for Evaluation and Treatment: Rehabilitation  SUBJECTIVE:  SUBJECTIVE STATEMENT: Pt reports she feels like she is back on track with her balance and mobility.  Pt did some walking this morning, ongoing R knee pain but plans to get shots into her knee.  Pt accompanied by: self  PERTINENT HISTORY: PMH: RA, hx of breast cancer,R malignant neoplasm of breast with radiation, HLD, HTN, osteoporosis  PAIN:  Are you having pain? Reports 3/10 pain in bilat hips, probably more soreness than pain   PRECAUTIONS: Fall  WEIGHT BEARING RESTRICTIONS: No  FALLS: Has patient fallen in last 6 months? No  LIVING ENVIRONMENT: Lives with: lives alone Lives in: House/apartment Stairs: Yes: External: 3 steps; none, 7 or 8 steps in the back with a railing  Has following equipment at home: None  PLOF: Independent and Leisure: Working out in the yard   PATIENT GOALS: Wants to get back some stability, does not want to stagger anymore   OBJECTIVE:   COGNITION: Overall cognitive status: Within functional limits for tasks  assessed   SENSATION: WFL   POSTURE: No Significant postural limitations  LOWER EXTREMITY MMT:    MMT Right Eval Left Eval  Hip flexion 4 4+  Hip extension    Hip abduction 5 5  Hip adduction 5 5  Hip internal rotation    Hip external rotation    Knee flexion 5 5  Knee extension 4+ 5  Ankle dorsiflexion 5 5  Ankle plantarflexion    Ankle inversion    Ankle eversion    (Blank rows = not tested)  All tested in sitting    TODAY'S TREATMENT:                                                                                                                               TherAct: For LTG assessment:  ABC: 85% 30 sec sit to stand: 9 reps Static stance on RLE: 15 sec    PATIENT EDUCATION: Education details: Continue HEP, results of OM and goal achievement Person educated: Patient Education method: Explanation Education comprehension: verbalized understanding  HOME EXERCISE PROGRAM: Access Code: [email or web address] From previous bout of therapy - will update review as appropriate    Access Code: [email or web address] URL: [email or web address] Date: 06/18/2023 Prepared by: Peter Congo  Exercises - Supine Bridge with Mini Swiss Ball Between Knees  - 1 x daily - 7 x weekly - 2 sets - 10 reps - Supine Lower Trunk Rotation  - 1 x daily - 7 x weekly - 1 sets - 10 reps - Sit to Stand Without Arm Support  - 1 x daily - 7 x weekly - 3 sets - 10 reps - Narrow Base of Support Standing Balance Eyes Closed on Foam Pad  - 1 x daily - 7 x weekly - 2 sets - 10 reps - Half Tandem Stance with Head Rotation on Foam Pad  - 1 x daily - 7 x weekly - 2 sets - 10 reps - Half Tandem Stance with  Head Nods on Foam Pad  - 1 x daily - 7 x weekly - 2 sets - 10 reps - Tandem Walking with Counter Support  - 1 x daily - 7 x weekly - 3 sets - 10 reps - Bridge with Abduction and Resistance Loop  - 1 x daily - 7 x weekly - 2 sets - 10 reps - Marching with Resistance  - 1 x daily - 7 x weekly - 4 sets - 10  reps - Seated Piriformis Stretch  - 1 x daily - 7 x weekly - 1 sets - 3 reps - 30 hold - Seated Flexion Stretch with Swiss Ball  - 1 x daily - 7 x weekly - 2 sets - 10 reps - 5 seconds hold  Bolded = reviewed with patient from previous POC  GOALS: Goals reviewed with patient? Yes  SHORT TERM GOALS: ALL STGS = LTGS  LONG TERM GOALS: Target date: 07/02/2023  Pt will be independent with final HEP for strength, gait, balance in order to build upon functional gains made in therapy. Baseline:  Goal status: GOAL MET  2.  Pt will improve 30 second chair stand to at least 9 sit<> stands in order to demo improvement for age related norms/improved endurance.  Baseline: 7 sit <> stands, no UE support, 8 sit to stands with no UE support (7/30), 9 sit to stands no UE support (8/1)  Goal status: GOAL MET  3.  Pt will improve RLE SLS time to at least 15 seconds for improved stability for functional tasks.  Baseline: R SLS: 12.4 seconds (Trendelenburg noted with incr postural sway), 15 sec (8/1) Goal status: GOAL MET  4.  Pt will improve ABC scale to at least a 69% in order to demo improved self confidence with balance tasks  Baseline:63.75% self confidence , 85% (8/1)  Goal status: GOAL MET  NEW SHORT TERM GOALS=LONG TERM GOALS due to length of POC   NEW LONG TERM GOALS:  Target date: 07/05/2023  Pt will be independent with final HEP for strength, gait, balance in order to build upon functional gains made in therapy. Baseline:  Goal status: GOAL MET  2.  Pt will improve 30 second chair stand to at least 9 sit<> stands in order to demo improvement for age related norms/improved endurance.  Baseline: 7 sit <> stands, no UE support, 8 sit to stands with no UE support (7/30), 9 sit to stands no UE support (8/1)  Goal status: GOAL MET  3.  Pt will improve RLE SLS time to at least 15 seconds for improved stability for functional tasks.  Baseline: R SLS: 12.4 seconds (Trendelenburg noted with incr  postural sway), 15 sec (8/1) Goal status: GOAL MET  4.  Pt will improve ABC scale to at least a 69% in order to demo improved self confidence with balance tasks  Baseline:63.75% self confidence , 85% (8/1)  Goal status: GOAL MET     ASSESSMENT:  CLINICAL IMPRESSION: Emphasis of skilled PT session on on reassessing LTG in preparation for d/c from OPPT services this date. Pt has met 4/4 LTG due to being independent with her HEP, increasing her functional LE strength, improving her balance in SLS, and improving her confidence in her ability to perform functional tasks in regards to balance. Pt comfortable with how to perform her HEP independently and agreeable to d/c from OPPT services this date.   OBJECTIVE IMPAIRMENTS: Abnormal gait, decreased activity tolerance, decreased balance, decreased strength, and pain.  ACTIVITY LIMITATIONS: bending, stairs, and locomotion level  PARTICIPATION LIMITATIONS: community activity and gardening   PERSONAL FACTORS: Age, Behavior pattern, Past/current experiences, Time since onset of injury/illness/exacerbation, and 3+ comorbidities: RA, hx of breast cancer,R malignant neoplasm of breast with radiation, HLD, HTN, osteoporosis  are also affecting patient's functional outcome.   REHAB POTENTIAL: Good  CLINICAL DECISION MAKING: Stable/uncomplicated  EVALUATION COMPLEXITY: Low    Peter Congo, PT, DPT, CSRS  07/04/23 9:51 AM

## 2023-07-09 ENCOUNTER — Ambulatory Visit: Payer: Medicare PPO | Attending: Physician Assistant | Admitting: Physician Assistant

## 2023-07-09 DIAGNOSIS — M170 Bilateral primary osteoarthritis of knee: Secondary | ICD-10-CM

## 2023-07-09 MED ORDER — HYALURONAN 30 MG/2ML IX SOSY
30.0000 mg | PREFILLED_SYRINGE | INTRA_ARTICULAR | Status: AC | PRN
Start: 2023-07-09 — End: 2023-07-09
  Administered 2023-07-09: 30 mg via INTRA_ARTICULAR

## 2023-07-09 MED ORDER — LIDOCAINE HCL 1 % IJ SOLN
1.5000 mL | INTRAMUSCULAR | Status: AC | PRN
Start: 2023-07-09 — End: 2023-07-09
  Administered 2023-07-09: 1.5 mL

## 2023-07-09 NOTE — Progress Notes (Signed)
   Procedure Note  Patient: Melissa Perez             Date of Birth: February 08, 1946           MRN: [id number]             Visit Date: 07/09/2023  Procedures: Visit Diagnoses:  1. Primary osteoarthritis of both knees    Orthovisc #1 bilateral knees, B/B Large Joint Inj: bilateral knee on 07/09/2023 8:01 AM Indications: pain Details: 25 G 1.5 in needle, medial approach  Arthrogram: No  Medications (Right): 1.5 mL lidocaine 1 %; 30 mg Hyaluronan 30 MG/2ML Aspirate (Right): 0 mL Medications (Left): 1.5 mL lidocaine 1 %; 30 mg Hyaluronan 30 MG/2ML Aspirate (Left): 0 mL Outcome: tolerated well, no immediate complications Procedure, treatment alternatives, risks and benefits explained, specific risks discussed. Consent was given by the patient. Immediately prior to procedure a time out was called to verify the correct patient, procedure, equipment, support staff and site/side marked as required. Patient was prepped and draped in the usual sterile fashion.     Patient tolerated the procedures well.  Aftercare was discussed.  Sherron Ales, PA-C

## 2023-07-16 ENCOUNTER — Ambulatory Visit: Payer: Medicare PPO | Attending: Physician Assistant | Admitting: Physician Assistant

## 2023-07-16 DIAGNOSIS — M170 Bilateral primary osteoarthritis of knee: Secondary | ICD-10-CM

## 2023-07-16 MED ORDER — HYALURONAN 30 MG/2ML IX SOSY
30.0000 mg | PREFILLED_SYRINGE | INTRA_ARTICULAR | Status: AC | PRN
Start: 2023-07-16 — End: 2023-07-16
  Administered 2023-07-16: 30 mg via INTRA_ARTICULAR

## 2023-07-16 MED ORDER — LIDOCAINE HCL 1 % IJ SOLN
1.5000 mL | INTRAMUSCULAR | Status: AC | PRN
Start: 2023-07-16 — End: 2023-07-16
  Administered 2023-07-16: 1.5 mL

## 2023-07-16 NOTE — Progress Notes (Signed)
   Procedure Note  Patient: Melissa Perez             Date of Birth: 1946/04/17           MRN: [id number]             Visit Date: 07/16/2023  Procedures: Visit Diagnoses:  1. Primary osteoarthritis of both knees     Orthovisc #2 bilateral knees, B/B Large Joint Inj: bilateral knee on 07/16/2023 9:38 AM Indications: pain Details: 25 G 1.5 in needle, medial approach Medications (Right): 1.5 mL lidocaine 1 %; 30 mg Hyaluronan 30 MG/2ML Aspirate (Right): 0 mL Medications (Left): 1.5 mL lidocaine 1 %; 30 mg Hyaluronan 30 MG/2ML Aspirate (Left): 0 mL Outcome: tolerated well, no immediate complications Procedure, treatment alternatives, risks and benefits explained, specific risks discussed. Consent was given by the patient. Immediately prior to procedure a time out was called to verify the correct patient, procedure, equipment, support staff and site/side marked as required. Patient was prepped and draped in the usual sterile fashion.      Patient tolerated the procedure well.  Aftercare was discussed.  Sherron Ales, PA-C

## 2023-07-23 ENCOUNTER — Other Ambulatory Visit: Payer: Self-pay | Admitting: Physician Assistant

## 2023-07-23 ENCOUNTER — Ambulatory Visit: Payer: Medicare PPO | Attending: Physician Assistant | Admitting: Physician Assistant

## 2023-07-23 DIAGNOSIS — M170 Bilateral primary osteoarthritis of knee: Secondary | ICD-10-CM | POA: Diagnosis not present

## 2023-07-23 DIAGNOSIS — M0579 Rheumatoid arthritis with rheumatoid factor of multiple sites without organ or systems involvement: Secondary | ICD-10-CM

## 2023-07-23 MED ORDER — LIDOCAINE HCL 1 % IJ SOLN
1.5000 mL | INTRAMUSCULAR | Status: AC | PRN
Start: 2023-07-23 — End: 2023-07-23
  Administered 2023-07-23: 1.5 mL

## 2023-07-23 MED ORDER — HYALURONAN 30 MG/2ML IX SOSY
30.0000 mg | PREFILLED_SYRINGE | INTRA_ARTICULAR | Status: AC | PRN
Start: 2023-07-23 — End: 2023-07-23
  Administered 2023-07-23: 30 mg via INTRA_ARTICULAR

## 2023-07-23 NOTE — Progress Notes (Signed)
   Procedure Note  Patient: Melissa Perez             Date of Birth: [birth year]           MRN: [id number]             Visit Date: 07/23/2023  Procedures: Visit Diagnoses:  1. Primary osteoarthritis of both knees    Orthovisc #3 Bilateral knee joint injections  Large Joint Inj: bilateral knee on 07/23/2023 10:10 AM Indications: pain Details: 27 G 1.5 in needle, medial approach  Arthrogram: No  Medications (Right): 1.5 mL lidocaine 1 %; 30 mg Hyaluronan 30 MG/2ML Aspirate (Right): 0 mL Medications (Left): 1.5 mL lidocaine 1 %; 30 mg Hyaluronan 30 MG/2ML Aspirate (Left): 0 mL Outcome: tolerated well, no immediate complications Procedure, treatment alternatives, risks and benefits explained, specific risks discussed. Consent was given by the patient. Immediately prior to procedure a time out was called to verify the correct patient, procedure, equipment, support staff and site/side marked as required. Patient was prepped and draped in the usual sterile fashion.      Patient tolerated the procedures well.  Aftercare was discussed. Sherron Ales, PA-C

## 2023-07-23 NOTE — Telephone Encounter (Signed)
Last Fill: 04/01/2023 (PLQ), 04/22/2023 (MTX)  Eye exam: 03/07/2023 WNL    Labs: 05/23/2023 CBC and CMP are normal.   Next Visit: 12/02/2023  Last Visit: 07/01/2023  [id number] arthritis involving multiple sites with positive rheumatoid factor   Current Dose per office note 07/01/2023: Plaquenil 200 mg 1 tablet po BID Monday through Friday, Methotrexate 7 tablets by mouth once weekly   Okay to refill Plaquenil?

## 2023-08-22 ENCOUNTER — Other Ambulatory Visit: Payer: Self-pay | Admitting: *Deleted

## 2023-08-22 DIAGNOSIS — Z79899 Other long term (current) drug therapy: Secondary | ICD-10-CM

## 2023-08-22 DIAGNOSIS — E559 Vitamin D deficiency, unspecified: Secondary | ICD-10-CM

## 2023-08-23 DIAGNOSIS — E559 Vitamin D deficiency, unspecified: Secondary | ICD-10-CM | POA: Diagnosis not present

## 2023-08-23 DIAGNOSIS — Z79899 Other long term (current) drug therapy: Secondary | ICD-10-CM | POA: Diagnosis not present

## 2023-08-24 LAB — CBC WITH DIFFERENTIAL/PLATELET
Absolute Monocytes: 608 cells/uL (ref 200–950)
Basophils Absolute: 52 cells/uL (ref 0–200)
Basophils Relative: 1 %
Eosinophils Absolute: 177 cells/uL (ref 15–500)
Eosinophils Relative: 3.4 %
HCT: 36.4 % (ref 35.0–45.0)
Hemoglobin: 12.2 g/dL (ref 11.7–15.5)
Lymphs Abs: 1373 cells/uL (ref 850–3900)
MCH: 34.1 pg — ABNORMAL HIGH (ref 27.0–33.0)
MCHC: 33.5 g/dL (ref 32.0–36.0)
MCV: 101.7 fL — ABNORMAL HIGH (ref 80.0–100.0)
MPV: 9.8 fL (ref 7.5–12.5)
Monocytes Relative: 11.7 %
Neutro Abs: 2990 cells/uL (ref 1500–7800)
Neutrophils Relative %: 57.5 %
Platelets: 215 10*3/uL (ref 140–400)
RBC: 3.58 10*6/uL — ABNORMAL LOW (ref 3.80–5.10)
RDW: 14.2 % (ref 11.0–15.0)
Total Lymphocyte: 26.4 %
WBC: 5.2 10*3/uL (ref 3.8–10.8)

## 2023-08-24 LAB — COMPLETE METABOLIC PANEL WITH GFR
AG Ratio: 2.1 (calc) (ref 1.0–2.5)
ALT: 19 U/L (ref 6–29)
AST: 27 U/L (ref 10–35)
Albumin: 4.4 g/dL (ref 3.6–5.1)
Alkaline phosphatase (APISO): 82 U/L (ref 37–153)
BUN: 15 mg/dL (ref 7–25)
CO2: 26 mmol/L (ref 20–32)
Calcium: 9.8 mg/dL (ref 8.6–10.4)
Chloride: 104 mmol/L (ref 98–110)
Creat: 0.77 mg/dL (ref 0.60–1.00)
Globulin: 2.1 g/dL (calc) (ref 1.9–3.7)
Glucose, Bld: 86 mg/dL (ref 65–99)
Potassium: 4.5 mmol/L (ref 3.5–5.3)
Sodium: 141 mmol/L (ref 135–146)
Total Bilirubin: 0.6 mg/dL (ref 0.2–1.2)
Total Protein: 6.5 g/dL (ref 6.1–8.1)
eGFR: 80 mL/min/{1.73_m2} (ref >=60)

## 2023-08-24 LAB — VITAMIN D 25 HYDROXY (VIT D DEFICIENCY, FRACTURES): Vit D, 25-Hydroxy: 42 ng/mL (ref 30–100)

## 2023-08-26 NOTE — Progress Notes (Signed)
CBC stable. Continue to take folic acid as recommended. CMP WNL.  Vitamin D WNL

## 2023-09-05 DIAGNOSIS — H43813 Vitreous degeneration, bilateral: Secondary | ICD-10-CM | POA: Diagnosis not present

## 2023-09-05 DIAGNOSIS — Z79899 Other long term (current) drug therapy: Secondary | ICD-10-CM | POA: Diagnosis not present

## 2023-09-05 DIAGNOSIS — H2513 Age-related nuclear cataract, bilateral: Secondary | ICD-10-CM | POA: Diagnosis not present

## 2023-09-05 DIAGNOSIS — M0570 Rheumatoid arthritis with rheumatoid factor of unspecified site without organ or systems involvement: Secondary | ICD-10-CM | POA: Diagnosis not present

## 2023-09-06 DIAGNOSIS — E785 Hyperlipidemia, unspecified: Secondary | ICD-10-CM | POA: Diagnosis not present

## 2023-09-06 DIAGNOSIS — L639 Alopecia areata, unspecified: Secondary | ICD-10-CM | POA: Diagnosis not present

## 2023-09-06 DIAGNOSIS — E559 Vitamin D deficiency, unspecified: Secondary | ICD-10-CM | POA: Diagnosis not present

## 2023-09-06 DIAGNOSIS — R7301 Impaired fasting glucose: Secondary | ICD-10-CM | POA: Diagnosis not present

## 2023-09-06 DIAGNOSIS — I10 Essential (primary) hypertension: Secondary | ICD-10-CM | POA: Diagnosis not present

## 2023-09-09 ENCOUNTER — Other Ambulatory Visit: Payer: Self-pay | Admitting: *Deleted

## 2023-09-09 ENCOUNTER — Telehealth: Payer: Self-pay | Admitting: *Deleted

## 2023-09-09 DIAGNOSIS — M064 Inflammatory polyarthropathy: Secondary | ICD-10-CM | POA: Diagnosis not present

## 2023-09-09 DIAGNOSIS — R7309 Other abnormal glucose: Secondary | ICD-10-CM | POA: Diagnosis not present

## 2023-09-09 DIAGNOSIS — E785 Hyperlipidemia, unspecified: Secondary | ICD-10-CM | POA: Diagnosis not present

## 2023-09-09 DIAGNOSIS — E782 Mixed hyperlipidemia: Secondary | ICD-10-CM | POA: Diagnosis not present

## 2023-09-09 DIAGNOSIS — I10 Essential (primary) hypertension: Secondary | ICD-10-CM | POA: Diagnosis not present

## 2023-09-09 MED ORDER — PREDNISONE 5 MG PO TABS: ORAL_TABLET | ORAL | 0 refills | Status: DC

## 2023-09-09 NOTE — Telephone Encounter (Signed)
Ok to send in a prednisone taper starting at 20 mg tapering by 5 mg every 4 days.  Take prednisone in the morning and avoid the use of NSAIDs.

## 2023-09-09 NOTE — Telephone Encounter (Signed)
Bil wrist, bil knee, bil shoulder pain x 2-3 weeks, Tylenol and voltaren gel is not helping, patient requesting prednisone taper to [facility].

## 2023-10-02 DIAGNOSIS — H524 Presbyopia: Secondary | ICD-10-CM | POA: Diagnosis not present

## 2023-10-14 ENCOUNTER — Other Ambulatory Visit: Payer: Self-pay | Admitting: Rheumatology

## 2023-10-14 DIAGNOSIS — M0579 Rheumatoid arthritis with rheumatoid factor of multiple sites without organ or systems involvement: Secondary | ICD-10-CM

## 2023-10-14 NOTE — Telephone Encounter (Signed)
Last Fill: 07/23/2023  Eye exam: 09/05/2023 WNL    Labs: CBC stable. Continue to take folic acid as recommended. CMP WNL. Vitamin D WNL  Next Visit: 12/02/2023  Last Visit: 07/01/2023  DX: Rheumatoid arthritis involving multiple sites with positive rheumatoid factor   Current Dose per office note 07/01/2023: Plaquenil 200 mg 1 tablet po BID Monday through Friday, Methotrexate 7 tablets by mouth once weekly   Okay to refill Plaquenil and MTX?

## 2023-10-22 ENCOUNTER — Telehealth: Payer: Self-pay | Admitting: Rheumatology

## 2023-10-22 NOTE — Telephone Encounter (Signed)
Please schedule visit to discuss medication options

## 2023-10-22 NOTE — Telephone Encounter (Signed)
Returned call to patient and scheduled patient an appointment for 09/23/2023 to discuss medication options.

## 2023-10-22 NOTE — Telephone Encounter (Signed)
Pt called having pain in hands, wrist, knees and shoulders. Pt states she's having some swelling. Pt took prednisone a couple of months ago, never went away. Pt states its worse now and interfering with activities as in walking, exercising and picking up things. Pt has been taking tylenol and doing heat/ice. Pt is on methotrexate and PLQ - 0 missed.

## 2023-10-23 NOTE — Progress Notes (Unsigned)
Office Visit Note  Patient: Melissa Perez             Date of Birth: [birth year]           MRN: [id number]             PCP: Renaye Rakers, MD Referring: Renaye Rakers, MD Visit Date: 10/24/2023 Occupation: @GUAROCC @  Subjective:  Discuss medication options   History of Present Illness: Melissa Perez is a 77 y.o. female with history of rheumatoid arthritis and osteoarthritis. She is taking Plaquenil 200 mg 1 tablet po BID Monday through Friday, Methotrexate 7 tablets by mouth once weekly, folic acid 2 mg by mouth daily.   CBC and CMP updated on 08/23/23.  PLQ Eye Exam: 09/05/2023 WNL @ [facility] [place].  Discussed the importance of holding methotrexate if she develops signs or symptoms of an infection and to resume once the infection has completely cleared.  Activities of Daily Living:  Patient reports morning stiffness for *** {minute/hour:19697}.   Patient {ACTIONS;DENIES/REPORTS:21021675::"Denies"} nocturnal pain.  Difficulty dressing/grooming: {ACTIONS;DENIES/REPORTS:21021675::"Denies"} Difficulty climbing stairs: {ACTIONS;DENIES/REPORTS:21021675::"Denies"} Difficulty getting out of chair: {ACTIONS;DENIES/REPORTS:21021675::"Denies"} Difficulty using hands for taps, buttons, cutlery, and/or writing: {ACTIONS;DENIES/REPORTS:21021675::"Denies"}  No Rheumatology ROS completed.   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   S/P lumpectomy of breast 12/03/2011    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HCC) 11/01/2011    Past Medical History:  Diagnosis Date   Arthritis    rheumatoid, take Humira   Arthritis    rheumatoid   Arthritis pain    Back pain    Breast cancer (HCC)    Cancer (HCC)    History of dental surgery    Hyperlipidemia    Hypertension    Personal history of radiation therapy    Rash    Rheumatoid arthritis (HCC)    S/P lumpectomy of breast 12/03/2011    Family History  Problem Relation Age of Onset   Breast cancer Mother    Cancer Mother    Kidney cancer Father    Cancer Father    Breast cancer Sister    Cancer Sister    Lung cancer Sister    Colon cancer Maternal Uncle    Liver cancer Paternal Aunt    Past Surgical History:  Procedure Laterality Date   ABDOMINAL HYSTERECTOMY     APPENDECTOMY     BREAST EXCISIONAL BIOPSY Left 1991   benign   BREAST LUMPECTOMY Right 11/2011   BREAST SURGERY     2 left breast biopsies-benign   left breast biopsies  1991 & 1990   Social History   Social History Narrative   Adult Son in the [employer] History  Administered Date(s) Administered   Fluad Quad(high Dose 65+) 08/12/2019   Influenza, High Dose Seasonal PF 09/10/2017, 09/14/2020   Influenza-Unspecified 07/03/2018   PFIZER(Purple Top)SARS-COV-2 Vaccination 01/14/2020, 02/06/2020, 08/05/2020, 04/20/2021   Pfizer Covid-19 Vaccine Bivalent Booster 25yrs & up 09/19/2021  Pfizer(Comirnaty)Fall Seasonal Vaccine 12 years and older 08/21/2023   Zoster, Live 11/24/2012     Objective: Vital Signs: There were no vitals taken for this visit.   Physical Exam Vitals and nursing note reviewed.  Constitutional:      Appearance: She is well-developed.  HENT:     Head: Normocephalic and atraumatic.  Eyes:     Conjunctiva/sclera: Conjunctivae normal.  Cardiovascular:     Rate and Rhythm: Normal rate and regular rhythm.     Heart sounds: Normal heart sounds.  Pulmonary:     Effort:  Pulmonary effort is normal.     Breath sounds: Normal breath sounds.  Abdominal:     General: Bowel sounds are normal.     Palpations: Abdomen is soft.  Musculoskeletal:     Cervical back: Normal range of motion.  Lymphadenopathy:     Cervical: No cervical adenopathy.  Skin:    General: Skin is warm and dry.     Capillary Refill: Capillary refill takes less than 2 seconds.  Neurological:     Mental Status: She is alert and oriented to person, place, and time.  Psychiatric:        Behavior: Behavior normal.      Musculoskeletal Exam: ***  CDAI Exam: CDAI Score: -- Patient Global: --; Provider Global: -- Swollen: --; Tender: -- Joint Exam 10/24/2023   No joint exam has been documented for this visit   There is currently no information documented on the homunculus. Go to the Rheumatology activity and complete the homunculus joint exam.  Investigation: No additional findings.  Imaging: No results found.  Recent Labs: Lab Results  Component Value Date   WBC 5.2 08/23/2023   HGB 12.2 08/23/2023   PLT 215 08/23/2023   NA 141 08/23/2023   K 4.5 08/23/2023   CL 104 08/23/2023   CO2 26 08/23/2023   GLUCOSE 86 08/23/2023   BUN 15 08/23/2023   CREATININE 0.77 08/23/2023   BILITOT 0.6 08/23/2023   ALKPHOS 53 06/20/2017   AST 27 08/23/2023   ALT 19 08/23/2023   PROT 6.5 08/23/2023   ALBUMIN 3.8 06/20/2017   CALCIUM 9.8 08/23/2023   GFRAA 90 03/16/2021    Speciality Comments: PLQ Eye Exam: 09/05/2023 WNL @ [facility] [place]. Follow up in 6 months. Humira-SEs  Procedures:  No procedures performed Allergies: Dilaudid [hydromorphone], Dilaudid [hydromorphone hcl], and Hydrocodone   Assessment / Plan:     Visit Diagnoses: No diagnosis found.  Orders: No orders of the defined types were placed in this encounter.  No orders of the defined types were placed in this encounter.   Face-to-face time spent with patient was *** minutes. Greater than 50% of  time was spent in counseling and coordination of care.  Follow-Up Instructions: No follow-ups on file.   Ellen Henri, CMA  Note - This record has been created using Animal nutritionist.  Chart creation errors have been sought, but may not always  have been located. Such creation errors do not reflect on  the standard of medical care.

## 2023-10-24 ENCOUNTER — Other Ambulatory Visit: Payer: Self-pay

## 2023-10-24 ENCOUNTER — Encounter: Payer: Self-pay | Admitting: Physician Assistant

## 2023-10-24 ENCOUNTER — Ambulatory Visit: Payer: Medicare PPO | Attending: Physician Assistant | Admitting: Physician Assistant

## 2023-10-24 ENCOUNTER — Ambulatory Visit: Payer: Medicare PPO

## 2023-10-24 VITALS — BP 129/76 | HR 87 | Resp 14 | Ht 61.75 in | Wt 133.0 lb

## 2023-10-24 DIAGNOSIS — M533 Sacrococcygeal disorders, not elsewhere classified: Secondary | ICD-10-CM | POA: Diagnosis not present

## 2023-10-24 DIAGNOSIS — M25511 Pain in right shoulder: Secondary | ICD-10-CM

## 2023-10-24 DIAGNOSIS — M25562 Pain in left knee: Secondary | ICD-10-CM

## 2023-10-24 DIAGNOSIS — M47816 Spondylosis without myelopathy or radiculopathy, lumbar region: Secondary | ICD-10-CM

## 2023-10-24 DIAGNOSIS — R7611 Nonspecific reaction to tuberculin skin test without active tuberculosis: Secondary | ICD-10-CM

## 2023-10-24 DIAGNOSIS — M810 Age-related osteoporosis without current pathological fracture: Secondary | ICD-10-CM

## 2023-10-24 DIAGNOSIS — Z5181 Encounter for therapeutic drug level monitoring: Secondary | ICD-10-CM | POA: Diagnosis not present

## 2023-10-24 DIAGNOSIS — Z79899 Other long term (current) drug therapy: Secondary | ICD-10-CM | POA: Diagnosis not present

## 2023-10-24 DIAGNOSIS — M25561 Pain in right knee: Secondary | ICD-10-CM

## 2023-10-24 DIAGNOSIS — M170 Bilateral primary osteoarthritis of knee: Secondary | ICD-10-CM | POA: Diagnosis not present

## 2023-10-24 DIAGNOSIS — G8929 Other chronic pain: Secondary | ICD-10-CM

## 2023-10-24 DIAGNOSIS — Z8679 Personal history of other diseases of the circulatory system: Secondary | ICD-10-CM

## 2023-10-24 DIAGNOSIS — Z872 Personal history of diseases of the skin and subcutaneous tissue: Secondary | ICD-10-CM

## 2023-10-24 DIAGNOSIS — Z853 Personal history of malignant neoplasm of breast: Secondary | ICD-10-CM

## 2023-10-24 DIAGNOSIS — E559 Vitamin D deficiency, unspecified: Secondary | ICD-10-CM

## 2023-10-24 DIAGNOSIS — M0579 Rheumatoid arthritis with rheumatoid factor of multiple sites without organ or systems involvement: Secondary | ICD-10-CM | POA: Diagnosis not present

## 2023-10-24 MED ORDER — TRIAMCINOLONE ACETONIDE 40 MG/ML IJ SUSP
40.0000 mg | INTRAMUSCULAR | Status: AC | PRN
Start: 2023-10-24 — End: 2023-10-24
  Administered 2023-10-24: 40 mg via INTRA_ARTICULAR

## 2023-10-24 MED ORDER — METHOTREXATE SODIUM CHEMO INJECTION 50 MG/2ML
20.0000 mg | INTRAMUSCULAR | 0 refills | Status: DC
Start: 2023-10-24 — End: 2024-01-09

## 2023-10-24 MED ORDER — TUBERCULIN-ALLERGY SYRINGES 27G X 1/2" 1 ML MISC
1 refills | Status: AC
Start: 2023-10-24 — End: ?

## 2023-10-24 MED ORDER — DICLOFENAC SODIUM 1 % EX GEL: CUTANEOUS | 2 refills | Status: AC

## 2023-10-24 MED ORDER — LIDOCAINE HCL 1 % IJ SOLN
1.0000 mL | INTRAMUSCULAR | Status: AC | PRN
Start: 2023-10-24 — End: 2023-10-24
  Administered 2023-10-24: 1 mL

## 2023-10-24 NOTE — Telephone Encounter (Signed)
Please review and sign pended voltaren gel refill. Thanks!

## 2023-10-24 NOTE — Progress Notes (Signed)
Educated patient on how to use a vial and syringe and reviewed injection technique with patient.  Patient was able to demonstrate proper technique for injections using vial and syringe.  Provided patient educational material regarding injection technique and storage of methotrexate.     Dose will be 20mg  (0.84mL) subcut every 7 days with folic acid 2mg  daily  Repeat CBC/CMP in 2 weeks then every 3 months. Rx sent to pharmacy today  Chesley Mires, PharmD, MPH, BCPS, CPP Clinical Pharmacist (Rheumatology and Pulmonology)

## 2023-10-24 NOTE — Patient Instructions (Addendum)
Standing Labs We placed an order today for your standing lab work.   Please have your standing labs drawn in 2-3 weeks then every 3 months  Please have your labs drawn 2 weeks prior to your appointment so that the provider can discuss your lab results at your appointment, if possible.  Please note that you may see your imaging and lab results in MyChart before we have reviewed them. We will contact you once all results are reviewed. Please allow our office up to 72 hours to thoroughly review all of the results before contacting the office for clarification of your results.  WALK-IN LAB HOURS  Monday through Thursday from 8:00 am -12:30 pm and 1:00 pm-5:00 pm and Friday from 8:00 am-12:00 pm.  Patients with office visits requiring labs will be seen before walk-in labs.  You may encounter longer than normal wait times. Please allow additional time. Wait times may be shorter on  Monday and Thursday afternoons.  We do not book appointments for walk-in labs. We appreciate your patience and understanding with our staff.   Labs are drawn by [facility]. Please bring your co-pay at the time of your lab draw.  You may receive a bill from [facility] for your lab work.  Please note if you are on Hydroxychloroquine and and an order has been placed for a Hydroxychloroquine level,  you will need to have it drawn 4 hours or more after your last dose.  If you wish to have your labs drawn at another location, please call the office 24 hours in advance so we can fax the orders.  The office is located at [address], [address], [place], [place] [address]   If you have any questions regarding directions or hours of operation,  please call [phone].   As a reminder, please drink plenty of water prior to coming for your lab work. Thanks!

## 2023-11-08 ENCOUNTER — Other Ambulatory Visit: Payer: Self-pay | Admitting: *Deleted

## 2023-11-08 DIAGNOSIS — Z79899 Other long term (current) drug therapy: Secondary | ICD-10-CM

## 2023-11-11 DIAGNOSIS — Z79899 Other long term (current) drug therapy: Secondary | ICD-10-CM | POA: Diagnosis not present

## 2023-11-12 LAB — COMPLETE METABOLIC PANEL WITH GFR
AG Ratio: 2 (calc) (ref 1.0–2.5)
ALT: 19 U/L (ref 6–29)
AST: 27 U/L (ref 10–35)
Albumin: 4.5 g/dL (ref 3.6–5.1)
Alkaline phosphatase (APISO): 80 U/L (ref 37–153)
BUN: 14 mg/dL (ref 7–25)
CO2: 27 mmol/L (ref 20–32)
Calcium: 9.5 mg/dL (ref 8.6–10.4)
Chloride: 104 mmol/L (ref 98–110)
Creat: 0.89 mg/dL (ref 0.60–1.00)
Globulin: 2.3 g/dL (ref 1.9–3.7)
Glucose, Bld: 85 mg/dL (ref 65–99)
Potassium: 4.2 mmol/L (ref 3.5–5.3)
Sodium: 140 mmol/L (ref 135–146)
Total Bilirubin: 0.3 mg/dL (ref 0.2–1.2)
Total Protein: 6.8 g/dL (ref 6.1–8.1)
eGFR: 68 mL/min/{1.73_m2} (ref >=60)

## 2023-11-12 LAB — CBC WITH DIFFERENTIAL/PLATELET
Absolute Lymphocytes: 1814 {cells}/uL (ref 850–3900)
Absolute Monocytes: 521 {cells}/uL (ref 200–950)
Basophils Absolute: 90 {cells}/uL (ref 0–200)
Basophils Relative: 1.6 %
Eosinophils Absolute: 140 {cells}/uL (ref 15–500)
Eosinophils Relative: 2.5 %
HCT: 37.7 % (ref 35.0–45.0)
Hemoglobin: 12.7 g/dL (ref 11.7–15.5)
MCH: 34.1 pg — ABNORMAL HIGH (ref 27.0–33.0)
MCHC: 33.7 g/dL (ref 32.0–36.0)
MCV: 101.3 fL — ABNORMAL HIGH (ref 80.0–100.0)
MPV: 10 fL (ref 7.5–12.5)
Monocytes Relative: 9.3 %
Neutro Abs: 3035 {cells}/uL (ref 1500–7800)
Neutrophils Relative %: 54.2 %
Platelets: 223 10*3/uL (ref 140–400)
RBC: 3.72 10*6/uL — ABNORMAL LOW (ref 3.80–5.10)
RDW: 14.1 % (ref 11.0–15.0)
Total Lymphocyte: 32.4 %
WBC: 5.6 10*3/uL (ref 3.8–10.8)

## 2023-11-12 NOTE — Progress Notes (Signed)
CBC stable. CMP WNL.

## 2023-11-18 NOTE — Progress Notes (Signed)
Office Visit Note  Patient: Melissa Perez             Date of Birth: 1946/11/23           MRN: [id number]             PCP: Renaye Rakers, MD Referring: Renaye Rakers, MD Visit Date: 12/02/2023 Occupation: @GUAROCC @  Subjective:  Medication monitoring   History of Present Illness: CEZANNE LAUGHLIN is a 77 y.o. female with history of rheumatoid arthritis and osteoarthritis. Patient remains on  Methotrexate 0.8 ml sq inj wk, folic acid 2 mg daily, and Plaquenil 200 mg 1 tablet by mouth BID M-F. She has noticed about a 70% improvement in her symptoms since switching from oral to injectable methotrexate.  She has been tolerating injectable methotrexate without any side effects or injection site reactions.  She presents today with persistent discomfort in her left shoulder and right knee joint.  Patient states that her right shoulder pain resolved after having a cortisone injection performed at her last office visit.  Patient states that she completed the prednisone taper after her last office visit which also helped alleviate her symptoms.  She has been taking Tylenol and using Voltaren gel as needed for pain relief.  She currently is experiencing sinus congestion which has been lingering since she has not been holding methotrexate.  She has a follow-up visit scheduled with her PCP on 12/10/2023.    Activities of Daily Living:  Patient reports morning stiffness for 0 minute.   Patient Reports nocturnal pain.  Difficulty dressing/grooming: Reports Difficulty climbing stairs: Denies Difficulty getting out of chair: Denies Difficulty using hands for taps, buttons, cutlery, and/or writing: Reports  Review of Systems  Constitutional:  Positive for fatigue.  HENT:  Negative for mouth sores and mouth dryness.   Eyes:  Negative for dryness.  Respiratory:  Negative for shortness of breath.   Cardiovascular:  Negative for chest pain and palpitations.  Gastrointestinal:  Negative for blood in stool,  constipation and diarrhea.  Endocrine: Negative for increased urination.  Genitourinary:  Negative for involuntary urination.  Musculoskeletal:  Positive for joint pain, joint pain, joint swelling, myalgias and myalgias. Negative for gait problem, muscle weakness, morning stiffness and muscle tenderness.  Skin:  Positive for sensitivity to sunlight. Negative for color change, rash and hair loss.  Allergic/Immunologic: Positive for susceptible to infections.  Neurological:  Negative for dizziness and headaches.  Hematological:  Negative for swollen glands.  Psychiatric/Behavioral:  Negative for depressed mood and sleep disturbance. The patient is not nervous/anxious.     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   S/P lumpectomy of breast 12/03/2011   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HCC) 11/01/2011    Past Medical History:  Diagnosis Date   Arthritis    rheumatoid, take Humira   Arthritis    rheumatoid   Arthritis pain    Back pain    Breast cancer (HCC)  Cancer [facility])    History of dental surgery    Hyperlipidemia    Hypertension    Personal history of radiation therapy    Rash    Rheumatoid arthritis (HCC)    S/P lumpectomy of breast 12/03/2011    Family History  Problem Relation Age of Onset   Breast cancer Mother    Cancer Mother    Kidney cancer Father    Cancer Father    Breast cancer Sister    Cancer Sister    Lung  cancer Sister    Colon cancer Maternal Uncle    Liver cancer Paternal Aunt    Past Surgical History:  Procedure Laterality Date   ABDOMINAL HYSTERECTOMY     APPENDECTOMY     BREAST EXCISIONAL BIOPSY Left 1991   benign   BREAST LUMPECTOMY Right 11/2011   BREAST SURGERY     2 left breast biopsies-benign   left breast biopsies  1991 & 1990   Social History   Social History Narrative   Adult Son in the [employer] History  Administered Date(s) Administered   Fluad Quad(high Dose 65+) 08/12/2019   Influenza, High Dose Seasonal PF 09/10/2017, 09/14/2020   Influenza-Unspecified 07/03/2018   PFIZER(Purple Top)SARS-COV-2 Vaccination 01/14/2020, 02/06/2020, 08/05/2020, 04/20/2021   Pfizer Covid-19 Vaccine Bivalent Booster 36yrs & up 09/19/2021   Pfizer(Comirnaty)Fall Seasonal Vaccine 12 years and older 08/21/2023   Zoster, Live 11/24/2012     Objective: Vital Signs: BP (!) 159/80 (BP Location: Right Arm, Patient Position: Sitting, Cuff Size: Normal)   Pulse 90   Resp 14   Ht 5' 1.75" (1.568 m)   Wt 133 lb (60.3 kg)   BMI 24.52 kg/m    Physical Exam Vitals and nursing note reviewed.  Constitutional:      Appearance: She is well-developed.  HENT:     Head: Normocephalic and atraumatic.  Eyes:     Conjunctiva/sclera: Conjunctivae normal.  Cardiovascular:     Rate and Rhythm: Normal rate and regular rhythm.     Heart sounds: Normal heart sounds.  Pulmonary:     Effort: Pulmonary effort is normal.     Breath sounds: Normal breath sounds.  Abdominal:     General: Bowel sounds are normal.     Palpations: Abdomen is soft.  Musculoskeletal:     Cervical back: Normal range of motion.  Lymphadenopathy:     Cervical: No cervical adenopathy.  Skin:    General: Skin is warm and dry.     Capillary Refill: Capillary refill takes less than 2 seconds.  Neurological:     Mental Status: She is alert and oriented to person, place, and time.  Psychiatric:        Behavior:  Behavior normal.      Musculoskeletal Exam: C-spine has good range of motion.  Right shoulder is full range of motion.  Left shoulder has slightly limited range of motion with abduction.  Elbow joints, wrist joints, MCPs, PIPs, DIPs have good range of motion with no synovitis.  Complete fist formation bilaterally.  Hip joints have good range of motion with no groin pain.  Knee joints have good range of motion no warmth or effusion.  Ankle joints have good range of motion with no tenderness or joint swelling.    CDAI Exam: CDAI Score: 13  Patient Global: 70 / 100; Provider Global: 40 / 100 Swollen: 0 ; Tender: 2  Joint Exam 12/02/2023      Right  Left  Glenohumeral  Tender  Knee   Tender        Investigation: No additional findings.  Imaging: No results found.   Recent Labs: Lab Results  Component Value Date   WBC 5.6 11/11/2023   HGB 12.7 11/11/2023   PLT 223 11/11/2023   NA 140 11/11/2023   K 4.2 11/11/2023   CL 104 11/11/2023   CO2 27 11/11/2023   GLUCOSE 85 11/11/2023   BUN 14 11/11/2023   CREATININE 0.89 11/11/2023   BILITOT 0.3 11/11/2023   ALKPHOS 53 06/20/2017   AST 27 11/11/2023   ALT 19 11/11/2023   PROT 6.8 11/11/2023   ALBUMIN 3.8 06/20/2017   CALCIUM 9.5 11/11/2023   GFRAA 90 03/16/2021    Speciality Comments: PLQ Eye Exam: 09/05/2023 WNL @ [facility] [place]. Follow up in 6 months. Humira-SEs  Procedures:  No procedures performed Allergies: Dilaudid [hydromorphone], Dilaudid [hydromorphone hcl], and Hydrocodone    Assessment / Plan:     Visit Diagnoses: Rheumatoid arthritis involving multiple sites with positive rheumatoid factor [facility]): Patient presents today with ongoing discomfort involving the left shoulder and right knee joint. No warmth or effusion of the right knee noted.  She has noticed about a 70% improvement in her symptoms since switching from oral to injectable methotrexate.  She is currently on methotrexate 0.8 mL sq  injections once weekly, folic acid 2 mg daily, Plaquenil 200 mg 1 tablet by mouth twice daily Monday through Friday.  She is tolerating combination therapy without any side effects and has not had any injection site reactions from methotrexate.  The discomfort in her right shoulder has resolved since undergoing a right glenohumeral joint cortisone injection on 10/24/23.   Given treatment options were discussed today to help alleviate her left shoulder and right knee pain.  She declined a cortisone injection at this time.  She requested a prednisone taper to be sent to the pharmacy today.  Prednisone taper starting 20 mg tapering by 5 mg every 4 days were sent to the pharmacy.  Instructions were provided.  She is advised to notify us if her symptoms persist or worsen.  She would like to remain on the current dose of methotrexate and Plaquenil as prescribed.  She was advised to notify us if she develops any new or worsening symptoms.  She will follow-up in the office in 2 months or sooner if needed.  High risk medication use - Methotrexate 0.8 ml sq inj wk, folic acid 2 mg daily, Plaquenil 200 mg 1 tablet by mouth BID M-F. Previous therapy: oral methotrexate and Humira PLQ Eye Exam: 09/05/2023 WNL @ [facility]  CBC and CMP updated on 11/11/23.  Her next lab work will be due in March and every 3 months.  Discussed the importance of holding methotrexate if she develops signs or symptoms of an infection and to resume once the infection has completely cleared.  Primary osteoarthritis of both knees - s/p orthovisc bilateral knee injections in August 2024.  She has good range of motion of both knee joints on examination today.  No warmth or effusion noted.  She continues to experience intermittent discomfort in her right knee.  Spondylosis of lumbar spine: She is not experiencing any increased discomfort in her lower back at this time.  No symptoms of radiculopathy.  Chronic right SI joint pain:  Not currently symptomatic.  Age-related osteoporosis without current pathological fracture - DEXA 02/05/2023: Right femoral neck BMD 0.598 with T-score -1.3.  She is taking a  daily calcium and vitamin D supplement.   Vitamin D deficiency: She is taking vitamin D 1000 units daily.   Medication monitoring encounter -Previously receiving Prolia injections through Dr. Tedra Senegal office.    Other medical conditions are listed as follows:  History of malignant neoplasm of breast  Positive PPD, treated  History of hypertension: Blood pressure was elevated today in the office.  Patient was advised to monitor blood pressure closely and to reach out to PCP if it remains elevated.  History of pityriasis rosea  Orders: No orders of the defined types were placed in this encounter.  Meds ordered this encounter  Medications   predniSONE (DELTASONE) 5 MG tablet    Sig: Take 4 tablets by mouth daily x 4 days, 3 tablets daily x 4 days, 2 tablets daily x 4 days, 1 tablet daily x 4 days.    Dispense:  40 tablet    Refill:  0    Follow-Up Instructions: Return in about 2 months (around 01/31/2024) for Rheumatoid arthritis, Osteoarthritis.   Gearldine Bienenstock, PA-C  Note - This record has been created using Dragon software.  Chart creation errors have been sought, but may not always  have been located. Such creation errors do not reflect on  the standard of medical care.

## 2023-12-02 ENCOUNTER — Ambulatory Visit: Payer: Medicare PPO | Attending: Physician Assistant | Admitting: Physician Assistant

## 2023-12-02 ENCOUNTER — Encounter: Payer: Self-pay | Admitting: Physician Assistant

## 2023-12-02 VITALS — BP 159/80 | HR 90 | Resp 14 | Ht 61.75 in | Wt 133.0 lb

## 2023-12-02 DIAGNOSIS — M533 Sacrococcygeal disorders, not elsewhere classified: Secondary | ICD-10-CM

## 2023-12-02 DIAGNOSIS — M170 Bilateral primary osteoarthritis of knee: Secondary | ICD-10-CM | POA: Diagnosis not present

## 2023-12-02 DIAGNOSIS — Z79899 Other long term (current) drug therapy: Secondary | ICD-10-CM | POA: Diagnosis not present

## 2023-12-02 DIAGNOSIS — G8929 Other chronic pain: Secondary | ICD-10-CM

## 2023-12-02 DIAGNOSIS — M47816 Spondylosis without myelopathy or radiculopathy, lumbar region: Secondary | ICD-10-CM | POA: Diagnosis not present

## 2023-12-02 DIAGNOSIS — Z5181 Encounter for therapeutic drug level monitoring: Secondary | ICD-10-CM

## 2023-12-02 DIAGNOSIS — Z853 Personal history of malignant neoplasm of breast: Secondary | ICD-10-CM

## 2023-12-02 DIAGNOSIS — E559 Vitamin D deficiency, unspecified: Secondary | ICD-10-CM

## 2023-12-02 DIAGNOSIS — M0579 Rheumatoid arthritis with rheumatoid factor of multiple sites without organ or systems involvement: Secondary | ICD-10-CM

## 2023-12-02 DIAGNOSIS — Z872 Personal history of diseases of the skin and subcutaneous tissue: Secondary | ICD-10-CM

## 2023-12-02 DIAGNOSIS — Z8679 Personal history of other diseases of the circulatory system: Secondary | ICD-10-CM

## 2023-12-02 DIAGNOSIS — M810 Age-related osteoporosis without current pathological fracture: Secondary | ICD-10-CM | POA: Diagnosis not present

## 2023-12-02 DIAGNOSIS — R7611 Nonspecific reaction to tuberculin skin test without active tuberculosis: Secondary | ICD-10-CM

## 2023-12-02 MED ORDER — PREDNISONE 5 MG PO TABS: ORAL_TABLET | ORAL | 0 refills | Status: DC

## 2023-12-02 NOTE — Patient Instructions (Signed)

## 2023-12-09 DIAGNOSIS — E782 Mixed hyperlipidemia: Secondary | ICD-10-CM | POA: Diagnosis not present

## 2023-12-09 DIAGNOSIS — I10 Essential (primary) hypertension: Secondary | ICD-10-CM | POA: Diagnosis not present

## 2023-12-09 DIAGNOSIS — E785 Hyperlipidemia, unspecified: Secondary | ICD-10-CM | POA: Diagnosis not present

## 2023-12-10 DIAGNOSIS — M064 Inflammatory polyarthropathy: Secondary | ICD-10-CM | POA: Diagnosis not present

## 2023-12-10 DIAGNOSIS — M0579 Rheumatoid arthritis with rheumatoid factor of multiple sites without organ or systems involvement: Secondary | ICD-10-CM | POA: Diagnosis not present

## 2023-12-10 DIAGNOSIS — I10 Essential (primary) hypertension: Secondary | ICD-10-CM | POA: Diagnosis not present

## 2023-12-10 DIAGNOSIS — E782 Mixed hyperlipidemia: Secondary | ICD-10-CM | POA: Diagnosis not present

## 2023-12-30 ENCOUNTER — Other Ambulatory Visit: Payer: Self-pay | Admitting: *Deleted

## 2023-12-30 ENCOUNTER — Telehealth: Payer: Self-pay | Admitting: Physician Assistant

## 2023-12-30 MED ORDER — PREDNISONE 5 MG PO TABS: ORAL_TABLET | ORAL | 0 refills | Status: AC

## 2023-12-30 NOTE — Addendum Note (Signed)
Addended by: Henriette Combs on: 12/30/2023 04:32 PM   Modules accepted: Orders

## 2023-12-30 NOTE — Telephone Encounter (Signed)
I returned the patient's call to discuss the current flare she is experiencing--patient reports that after completing her last prednisone taper she had a recurrence of pain and inflammation involving multiple joints about 4 days later.  Patient states that her symptoms have been most severe involving both shoulders, both wrist, both hands.  She has tried ice, heat, topical agents, and taking Tylenol arthritis for pain relief.  Patient states that she experiences some temporary relief with over-the-counter products but overall has had difficulty performing ADLs due to severity of symptoms.  She has remained on injectable methotrexate and Plaquenil as prescribed without gaps in therapy.  Discussed that she will likely require more aggressive therapy.  In the past the patient was treated with Humira.   Please call the patient back to schedule a sooner office visit this week (in the morning) to discuss treatment options.    Please also pend a prescription for prednisone 20 mg tapering by 5 mg every 4 days.

## 2023-12-30 NOTE — Telephone Encounter (Signed)
Patient contacted the office and requested a call back from [place] directly.  Call back numbers [phone] or [phone].

## 2023-12-31 ENCOUNTER — Ambulatory Visit: Payer: Medicare PPO | Admitting: Physician Assistant

## 2023-12-31 NOTE — Telephone Encounter (Signed)
Documented encounter until telephone encounter-sent to [employer]

## 2024-01-03 NOTE — Progress Notes (Unsigned)
Office Visit Note  Patient: Melissa Perez             Date of Birth: 1946/11/13           MRN: [id number]             PCP: Renaye Rakers, MD Referring: Renaye Rakers, MD Visit Date: 01/06/2024 Occupation: @GUAROCC @  Subjective:  Discuss medication options   History of Present Illness: Melissa Perez is a 78 y.o. female with history of seropositive rheumatoid arthritis and osteoarthritis.  Patient remains on Methotrexate 0.8 ml sq inj wk, folic acid 2 mg daily, Plaquenil 200 mg 1 tablet by mouth BID M-F. She is tolerating combination therapy without any side effects.  She is currently taking a prednisone taper and is on 15 mg daily.  She has noticed over 65% improvement in her symptoms since initiating prednisone.  The pain and range of motion in her shoulders has improved.  She continues to have some soreness in her right wrist.  Her knee joint pain has been more manageable with the use of prednisone.  She presents today to discuss treatment options.  She has been experiencing recurrent rheumatoid arthritis flare since July 2024.  Patient reports that this is the most severe symptoms have been since initially being diagnosed in 2009.      Activities of Daily Living:  Patient reports morning stiffness for 0 minutes.   Patient Reports nocturnal pain.  Difficulty dressing/grooming: Reports Difficulty climbing stairs: Denies Difficulty getting out of chair: Denies Difficulty using hands for taps, buttons, cutlery, and/or writing: Reports  Review of Systems  Constitutional:  Positive for fatigue.  HENT:  Negative for mouth sores, mouth dryness and nose dryness.   Eyes:  Negative for pain and dryness.  Respiratory:  Negative for shortness of breath and difficulty breathing.   Cardiovascular:  Negative for chest pain and palpitations.  Gastrointestinal:  Negative for blood in stool, constipation and diarrhea.  Endocrine: Negative for increased urination.  Genitourinary:  Negative for involuntary  urination.  Musculoskeletal:  Positive for joint pain, joint pain, joint swelling, myalgias, muscle weakness, muscle tenderness and myalgias. Negative for gait problem and morning stiffness.  Skin:  Positive for hair loss and sensitivity to sunlight. Negative for color change and rash.  Allergic/Immunologic: Positive for susceptible to infections.  Neurological:  Negative for dizziness and headaches.  Hematological:  Negative for swollen glands.  Psychiatric/Behavioral:  Negative for depressed mood and sleep disturbance. The patient is not nervous/anxious.     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   S/P lumpectomy of breast 12/03/2011   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HCC) 11/01/2011    Past Medical History:  Diagnosis Date   Arthritis    rheumatoid, take Humira   Arthritis    rheumatoid   Arthritis pain    Back pain    Breast cancer (HCC)    Cancer (HCC)    History of dental surgery  Hyperlipidemia    Hypertension    Personal history of radiation therapy    Rash    Rheumatoid arthritis (HCC)    S/P lumpectomy of breast 12/03/2011    Family History  Problem Relation Age of Onset   Breast cancer Mother    Cancer Mother    Kidney cancer Father    Cancer Father    Breast cancer Sister    Cancer Sister    Lung cancer Sister    Colon cancer Maternal Uncle    Liver cancer Paternal Aunt    Past Surgical History:   Procedure Laterality Date   ABDOMINAL HYSTERECTOMY     APPENDECTOMY     BREAST EXCISIONAL BIOPSY Left 1991   benign   BREAST LUMPECTOMY Right 11/2011   BREAST SURGERY     2 left breast biopsies-benign   left breast biopsies  1991 & 1990   Social History   Social History Narrative   Adult Son in the [employer] History  Administered Date(s) Administered   Fluad Quad(high Dose 65+) 08/12/2019   Influenza, High Dose Seasonal PF 09/10/2017, 09/14/2020   Influenza-Unspecified 07/03/2018   PFIZER(Purple Top)SARS-COV-2 Vaccination 01/14/2020, 02/06/2020, 08/05/2020, 04/20/2021   Pfizer Covid-19 Vaccine Bivalent Booster 9yrs & up 09/19/2021   Pfizer(Comirnaty)Fall Seasonal Vaccine 12 years and older 08/21/2023   Zoster, Live 11/24/2012     Objective: Vital Signs: BP (!) 144/79 (BP Location: Left Arm, Patient Position: Sitting, Cuff Size: Normal)   Pulse 94   Resp 13   Ht 5' 1.75" (1.568 m)   Wt 136 lb (61.7 kg)   BMI 25.08 kg/m    Physical Exam Vitals and nursing note reviewed.  Constitutional:      Appearance: She is well-developed.  HENT:     Head: Normocephalic and atraumatic.  Eyes:     Conjunctiva/sclera: Conjunctivae normal.  Cardiovascular:     Rate and Rhythm: Normal rate and regular rhythm.     Heart sounds: Normal heart sounds.  Pulmonary:     Effort: Pulmonary effort is normal.     Breath sounds: Normal breath sounds.  Abdominal:     General: Bowel sounds are normal.     Palpations: Abdomen is soft.  Musculoskeletal:     Cervical back: Normal range of motion.  Lymphadenopathy:     Cervical: No cervical adenopathy.  Skin:    General: Skin is warm and dry.     Capillary Refill: Capillary refill takes less than 2 seconds.  Neurological:     Mental Status: She is alert and oriented to person, place, and time.  Psychiatric:        Behavior: Behavior normal.      Musculoskeletal Exam: C-spine, thoracic spine, and lumbar spine good ROM.   Shoulder joints and elbow joints have good ROM with no discomfort.  Limited ROM of the right wrist especially with extension.  No synovitis of MCPs or PIP joints.  Hip joints have good ROM with no groin pain.  Knee joints have good ROM with no warmth or effusion.  Ankle joints have good ROM with no tenderness or joint swelling.   CDAI Exam: CDAI Score: -- Patient Global: --; Provider Global: -- Swollen: --; Tender: -- Joint Exam 01/06/2024   No joint exam has been documented for this visit   There is currently no information documented on the homunculus. Go to the Rheumatology activity and complete the homunculus joint exam.  Investigation: No additional findings.  Imaging: No results found.  Recent Labs: Lab Results  Component Value Date   WBC 5.6 11/11/2023   HGB 12.7 11/11/2023   PLT 223 11/11/2023   NA 140 11/11/2023   K 4.2 11/11/2023   CL 104 11/11/2023   CO2 27 11/11/2023   GLUCOSE 85 11/11/2023   BUN 14 11/11/2023   CREATININE 0.89 11/11/2023   BILITOT 0.3 11/11/2023   ALKPHOS 53 06/20/2017   AST 27 11/11/2023   ALT 19 11/11/2023   PROT 6.8 11/11/2023   ALBUMIN 3.8 06/20/2017   CALCIUM 9.5 11/11/2023   GFRAA 90 03/16/2021    Speciality Comments: PLQ Eye Exam: 09/05/2023 WNL @ [facility] [place]. Follow up in 6 months. Humira-SEs  Procedures:  No procedures performed Allergies: Dilaudid [hydromorphone], Dilaudid [hydromorphone hcl], and Hydrocodone   Assessment / Plan:     Visit Diagnoses: Rheumatoid arthritis involving multiple sites with positive rheumatoid factor [facility]): Patient presents today to discuss treatment options.  She has been experiencing recurrent severe flares of rheumatoid arthritis since July 2024.  She is currently on Plaquenil 200 mg 1 tablet by mouth twice daily Monday through Friday and methotrexate 0.8 mL sq injections once weekly.  She switched from oral to injectable methotrexate but continued to have recurrent flares.  She is  currently taking a prednisone and is on 15 mg daily.  She has had a 65% improvement in symptoms since starting the prednisone taper.  The patient previously tried Humira but discontinued due to intolerance.  Patient reports that while on Humira she felt that she was "inside of a bubble."  Given treatment options were discussed today in detail.  Reviewed indications, contraindications, potential side effects of Enbrel.  All questions were addressed and consent was obtained.  Plan to apply for Enbrel through her insurance.  The following lab work will be updated prior to initiating Enbrel.  Given that the patient previously had a positive PPD requiring treatment in highschool a repeat CXR will be ordered.  She will remain on methotrexate and Plaquenil as combination therapy for now.  She will complete the current prednisone taper as prescribed.  She will follow-up in the office in 2 to 3 months to assess her response.  Medication counseling:   TB Test: Pending  Hepatitis panel: pending SPEP: Pending  Immunoglobulins: Pending  Chest x-ray: Pending  Does patient have diagnosis of heart failure?  No  Counseled patient that Enbrel is a TNF blocking agent.  Reviewed Enbrel dose of 50 mg once weekly.  Counseled patient on purpose, proper use, and adverse effects of Enbrel.  Reviewed the most common adverse effects including infections, headache, and injection site reactions. Discussed that there is the possibility of an increased risk of malignancy but it is not well understood if this increased risk is due to the medication or the disease state.  Advised patient to get yearly dermatology exams due to risk of skin cancer.  Reviewed the importance of regular labs while on Enbrel therapy.  Advised patient to get standing labs one month after starting Enbrel then every 2 months.  Provided patient with standing lab orders.  Counseled patient that Enbrel should be held prior to scheduled surgery.  Counseled patient to  avoid live vaccines while on Enbrel.  Advised patient to get annual influenza vaccine and the pneumococcal vaccine as needed.  Provided patient with medication education material and answered all questions.  Patient voiced understanding.  Patient consented to Enbrel.  Will upload consent into the media tab.  Reviewed storage instructions  for Enbrel.  Advised initial injection must be administered in office.  Patient voiced understanding.    High risk medication use - Applying for Enbrel 50 mg sq injections once weekly.  She will remain on Methotrexate 0.8 ml sq inj wk, folic acid 2 mg daily, Plaquenil 200 mg 1 tablet by mouth BID M-F.  Previous therapy: oral MTX,Humira. CBC and CMP updated on 11/11/23. Her next lab work will be due in 1 month then every 3 months. Standing orders for CBC and CMP remain in place.  TB gold pending.  CXR ordered.  Discussed the importance of holding Enbrel and methotrexate if she develops signs or symptoms of an infection and to resume once the infection has completely cleared.  PLQ Eye Exam: 09/05/2023 WNL @ [facility]  - Plan: DG Chest 2 View, QuantiFERON-TB Gold Plus, IgG, IgA, IgM, Serum protein electrophoresis with reflex, Hepatitis B surface antigen, Hepatitis B core antibody, IgM, Hepatitis C antibody  Screening for tuberculosis - Order for TB gold released today. CXR ordered today.  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 CXR 2013. Plan to update TB gold and CXR prior to starting Enbrel.  - Plan: DG Chest 2 View, QuantiFERON-TB Gold Plus  Primary osteoarthritis of both knees: s/p orthovisc bilateral knee injections in August 2024. No warmth or effusion noted today.   Spondylosis of lumbar spine: Not currently symptomatic.   Chronic right SI joint pain: Not currently symptomatic.    Age-related osteoporosis without current pathological fracture - DEXA 02/05/2023: Right femoral neck BMD 0.598 with T-score -1.3.  Taking a calcium and vitamin D supplement daily.    Vitamin D deficiency: She is taking a vitamin D supplement.   Other medical conditions are listed as follows:   Medication monitoring encounter-Previously receiving Prolia injections through Dr. Tedra [place] office.    History of malignant neoplasm of breast - 2012-Lumpectomy  History of hypertension: BP was 144/79 today in the office.   History of pityriasis rosea     Orders: Orders Placed This Encounter  Procedures   DG Chest 2 View   QuantiFERON-TB Gold Plus   IgG, IgA, IgM   Serum protein electrophoresis with reflex   Hepatitis B surface antigen   Hepatitis B core antibody, IgM   Hepatitis C antibody   No orders of the defined types were placed in this encounter.   Follow-Up Instructions: Return in about 3 months (around 04/04/2024) for Rheumatoid arthritis, Osteoarthritis.   Gearldine Bienenstock, PA-C  Note - This record has been created using Dragon software.  Chart creation errors have been sought, but may not always  have been located. Such creation errors do not reflect on  the standard of medical care.

## 2024-01-06 ENCOUNTER — Encounter: Payer: Self-pay | Admitting: Physician Assistant

## 2024-01-06 ENCOUNTER — Ambulatory Visit (HOSPITAL_COMMUNITY)
Admission: RE | Admit: 2024-01-06 | Discharge: 2024-01-06 | Disposition: A | Discharge status: Home / Self Care | Payer: Medicare PPO | Source: Ambulatory Visit | Attending: Physician Assistant | Admitting: Physician Assistant

## 2024-01-06 ENCOUNTER — Ambulatory Visit: Payer: Medicare PPO | Attending: Physician Assistant | Admitting: Physician Assistant

## 2024-01-06 VITALS — BP 144/79 | HR 94 | Resp 13 | Ht 61.75 in | Wt 136.0 lb

## 2024-01-06 DIAGNOSIS — M0579 Rheumatoid arthritis with rheumatoid factor of multiple sites without organ or systems involvement: Secondary | ICD-10-CM

## 2024-01-06 DIAGNOSIS — Z5181 Encounter for therapeutic drug level monitoring: Secondary | ICD-10-CM

## 2024-01-06 DIAGNOSIS — M47816 Spondylosis without myelopathy or radiculopathy, lumbar region: Secondary | ICD-10-CM

## 2024-01-06 DIAGNOSIS — R7611 Nonspecific reaction to tuberculin skin test without active tuberculosis: Secondary | ICD-10-CM

## 2024-01-06 DIAGNOSIS — M51369 Other intervertebral disc degeneration, lumbar region without mention of lumbar back pain or lower extremity pain: Secondary | ICD-10-CM

## 2024-01-06 DIAGNOSIS — Z01818 Encounter for other preprocedural examination: Secondary | ICD-10-CM | POA: Diagnosis not present

## 2024-01-06 DIAGNOSIS — E559 Vitamin D deficiency, unspecified: Secondary | ICD-10-CM | POA: Diagnosis not present

## 2024-01-06 DIAGNOSIS — Z872 Personal history of diseases of the skin and subcutaneous tissue: Secondary | ICD-10-CM

## 2024-01-06 DIAGNOSIS — Z111 Encounter for screening for respiratory tuberculosis: Secondary | ICD-10-CM

## 2024-01-06 DIAGNOSIS — Z79899 Other long term (current) drug therapy: Secondary | ICD-10-CM | POA: Diagnosis not present

## 2024-01-06 DIAGNOSIS — Z8679 Personal history of other diseases of the circulatory system: Secondary | ICD-10-CM

## 2024-01-06 DIAGNOSIS — M810 Age-related osteoporosis without current pathological fracture: Secondary | ICD-10-CM

## 2024-01-06 DIAGNOSIS — M170 Bilateral primary osteoarthritis of knee: Secondary | ICD-10-CM

## 2024-01-06 DIAGNOSIS — Z853 Personal history of malignant neoplasm of breast: Secondary | ICD-10-CM

## 2024-01-06 DIAGNOSIS — G8929 Other chronic pain: Secondary | ICD-10-CM

## 2024-01-06 DIAGNOSIS — M533 Sacrococcygeal disorders, not elsewhere classified: Secondary | ICD-10-CM | POA: Diagnosis not present

## 2024-01-06 DIAGNOSIS — Z1159 Encounter for screening for other viral diseases: Secondary | ICD-10-CM | POA: Diagnosis not present

## 2024-01-06 NOTE — Patient Instructions (Signed)
Etanercept Injection What is this medication? ETANERCEPT (et a [employer]) treats autoimmune conditions, such as psoriasis and certain types of arthritis. It works by slowing down an overactive immune system. It belongs to a group of medications called TNF inhibitors. This medicine may be used for other purposes; ask your health care provider or pharmacist if you have questions. COMMON BRAND NAME(S): Enbrel What should I tell my care team before I take this medication? They need to know if you have any of these conditions: Bleeding disorder Cancer Diabetes Granulomatosis with polyangiitis Heart failure HIV or AIDS Immune system problems Infection, such as tuberculosis (TB) or other bacterial, fungal or viral infections Liver disease Nervous system problems, such as Guillain-Barre syndrome, multiple sclerosis or seizures Recent or upcoming vaccine An unusual or allergic reaction to etanercept, other medications, food, dyes, or preservatives Pregnant or trying to get pregnant Breastfeeding How should I use this medication? The medication is injected under the skin. You will be taught how to prepare and give it. Take it as directed on the prescription label. Keep taking it unless your care team tells you stop. This medication comes with INSTRUCTIONS FOR USE. Ask your pharmacist for directions on how to use this medication. Read the information carefully. Talk to your pharmacist or care team if you have questions. If you use a pen, be sure to take off the outer needle cover before using the dose. It is important that you put your used needles and syringes in a special sharps container. Do not put them in a trash can. If you do not have a sharps container, call your pharmacist or care team to get one. A special MedGuide will be given to you by the pharmacist with each prescription and refill. Be sure to read this information carefully each time. Talk to your care team about the use of this  medication in children. While it may be prescribed for children as young as 84 years of age for selected conditions, precautions do apply. Overdosage: If you think you have taken too much of this medicine contact a poison control center or emergency room at once. NOTE: This medicine is only for you. Do not share this medicine with others. What if I miss a dose? If you miss a dose, take it as soon as you can. If it is almost time for your next dose, take only that dose. Do not take double or extra doses. What may interact with this medication? Do not take this medication with any of the following: Biologic medications, such as adalimumab, certolizumab, golimumab, infliximab Live vaccines Rilonacept This medication may also interact with the following: Abatacept [place] Biologic medications, such as anifrolumab, baricitinib, belimumab, canakinumab, natalizumab, rituximab, sarilumab, tocilizumab, tofacitinib, upadacitinib, vedolizumab Cyclophosphamide Sulfasalazine This list may not describe all possible interactions. Give your health care provider a list of all the medicines, herbs, non-prescription drugs, or dietary supplements you use. Also tell them if you smoke, drink alcohol, or use illegal drugs. Some items may interact with your medicine. What should I watch for while using this medication? Visit your care team for regular checks on your progress. Tell your care team if your symptoms do not start to get better or if they get worse. This medication may increase your risk of getting an infection. Call your care team for advice if you get a fever, chills, sore throat, or other symptoms of a cold or flu. Do not treat yourself. Try to avoid being around people who are sick. If  you have not had the measles or chickenpox vaccines, tell your care team right away if you are around someone with these viruses. You will be tested for tuberculosis (TB) before you start this medication. If your care team  prescribes any medication for TB, you should start taking the TB medication before starting this medication. Make sure to finish the full course of TB medication. Avoid taking medications that contain aspirin, acetaminophen, ibuprofen, naproxen, or ketoprofen unless instructed by your care team. These medications may hide fever. Talk to your care team about your risk of cancer. You may be more at risk for certain types of cancer if you take this medication. This medication can decrease the response to a vaccine. If you need to get vaccinated, tell your care team if you have received this medication. Extra booster doses may be needed. Talk to your care team to see if a different vaccination schedule is needed. What side effects may I notice from receiving this medication? Side effects that you should report to your care team as soon as possible: Allergic reactions--skin rash, itching, hives, swelling of the face, lips, tongue, or throat Body pain, tingling, or numbness Eye pain, change in vision, vision loss Heart failure--shortness of breath, swelling of the ankles, feet, or hands, sudden weight gain, unusual weakness or fatigue Infection--fever, chills, cough, sore throat, wounds that don't heal, pain or trouble when passing urine, general feeling of discomfort or being unwell Liver injury--right upper belly pain, loss of appetite, nausea, light-colored stool, dark yellow or brown urine, yellowing skin or eyes, unusual weakness or fatigue Low red blood cell level--unusual weakness or fatigue, dizziness, headache, trouble breathing Lupus-like syndrome--joint pain, swelling, or stiffness, butterfly-shaped rash on the face, rashes that get worse in the sun, fever, unusual weakness or fatigue New or worsening psoriasis--rash with itchy, scaly patches Seizures Unusual bruising or bleeding Weakness in arms and legs Side effects that usually do not require medical attention (report to your care team if  they continue or are bothersome): Headache Pain, redness, or irritation at injection site Sinus pain or pressure around the face or forehead This list may not describe all possible side effects. Call your doctor for medical advice about side effects. You may report side effects to FDA at 1-800-FDA-1088. Where should I keep my medication? Keep out of the reach of children and pets. See product for storage information. Each product may have different instructions. Get rid of any unused medication after the expiration date. To get rid of medications that are no longer needed or have expired: Take the medication to a medication take-back program. Check with your pharmacy or law enforcement to find a location. If you cannot return the medication, ask your pharmacist or care team how to get rid of this medication safely. NOTE: This sheet is a summary. It may not cover all possible information. If you have questions about this medicine, talk to your doctor, pharmacist, or health care provider.  2024 Elsevier/Gold Standard (2022-05-16 00:00:00)

## 2024-01-06 NOTE — Progress Notes (Signed)
Pharmacy Note  Subjective: Patient presents today to the [facility] for follow up office visit.  Patient seen by the pharmacist for counseling on Enbrel for rheumatoid arthritis. Took Humira in past - had side effects. Currently on MTX (has had relatively waning response) Hydroxychloroquine - minimal clinical response  Diagnosis of heart failure: No  Objective:  CBC    Component Value Date/Time   WBC 5.6 11/11/2023 0840   RBC 3.72 (L) 11/11/2023 0840   HGB 12.7 11/11/2023 0840   HGB 11.9 07/02/2013 0921   HCT 37.7 11/11/2023 0840   HCT 35.1 07/02/2013 0921   PLT 223 11/11/2023 0840   PLT 216 07/02/2013 0921   MCV 101.3 (H) 11/11/2023 0840   MCV 94.4 07/02/2013 0921   MCH 34.1 (H) 11/11/2023 0840   MCHC 33.7 11/11/2023 0840   RDW 14.1 11/11/2023 0840   RDW 14.3 07/02/2013 0921   LYMPHSABS 1,373 08/23/2023 0844   LYMPHSABS 1.5 07/02/2013 0921   MONOABS 486 06/20/2017 0958   MONOABS 0.3 07/02/2013 0921   EOSABS 140 11/11/2023 0840   EOSABS 0.1 07/02/2013 0921   BASOSABS 90 11/11/2023 0840   BASOSABS 0.0 07/02/2013 0921     CMP     Component Value Date/Time   NA 140 11/11/2023 0840   NA 141 06/22/2016 0000   NA 143 07/02/2013 0922   K 4.2 11/11/2023 0840   K 3.9 07/02/2013 0922   CL 104 11/11/2023 0840   CL 103 12/31/2012 0850   CO2 27 11/11/2023 0840   CO2 27 07/02/2013 0922   GLUCOSE 85 11/11/2023 0840   GLUCOSE 92 07/02/2013 0922   GLUCOSE 96 12/31/2012 0850   BUN 14 11/11/2023 0840   BUN 17 06/22/2016 0000   BUN 17.4 07/02/2013 0922   CREATININE 0.89 11/11/2023 0840   CREATININE 0.9 07/02/2013 0922   CALCIUM 9.5 11/11/2023 0840   CALCIUM 9.5 07/02/2013 0922   PROT 6.8 11/11/2023 0840   PROT 6.9 07/02/2013 0922   ALBUMIN 3.8 06/20/2017 0958   ALBUMIN 3.6 07/02/2013 0922   AST 27 11/11/2023 0840   AST 24 07/02/2013 0922   ALT 19 11/11/2023 0840   ALT 18 07/02/2013 0922   ALKPHOS 53 06/20/2017 0958   ALKPHOS 59 07/02/2013 0922    BILITOT 0.3 11/11/2023 0840   BILITOT 0.31 07/02/2013 0922   GFRNONAA 78 03/16/2021 1332   GFRAA 90 03/16/2021 1332     Baseline Immunosuppressant Therapy Labs TB GOLD   Hepatitis Panel   HIV No results found for: "HIV" Immunoglobulins   SPEP    Latest Ref Rng & Units 11/11/2023    8:40 AM  Serum Protein Electrophoresis  Total Protein 6.1 - 8.1 g/dL 6.8    [id number] No results found for: "G6PDH" TPMT No results found for: "TPMT"   Chest x-ray: ordered today  Assessment/Plan:  Counseled patient that Enbrel is a TNF blocking agent.  Counseled patient on purpose, proper use, and adverse effects of Enbrel.  Reviewed the most common adverse effects including infections, headache, and injection site reactions.  Discussed that there is the possibility of an increased risk of malignancy including non-melanoma skin cancer but it is not well understood if this increased risk is due to the medication or the disease state.  Advised patient to get yearly dermatology exams due to risk of skin cancer.  Counseled patient that Enbrel should be held prior to scheduled surgery.  Counseled patient to avoid live vaccines while on Enbrel.  Recommend annual influenza,  PCV 15 or PCV20 or Pneumovax 23, and Shingrix as indicated.  Reviewed the importance of regular labs while on Enbrel therapy.  Will monitor CBC and CMP 1 month after starting and then every 3 months routinely thereafter. Will monitor TB gold annually. Standing orders placed. Provided patient with medication education material and answered all questions.  Patient consented to Enbrel.  Will upload consent into the media tab.  Reviewed storage instructions for Enbrel.  Advised initial injection must be administered in office.    Dermatology referral will be placed at new start visit.   If TB gold negative, then can proceed with yearly TB gold draws. If TB indeterminate, will need yearly chest x-rays.  Patient will likely have $100 copay per 28  days (former [employer] employee). Patient comfortable with this copay  Chesley Mires, PharmD, MPH, BCPS, CPP Clinical Pharmacist (Rheumatology and Pulmonology)

## 2024-01-07 ENCOUNTER — Telehealth: Payer: Self-pay | Admitting: Pharmacist

## 2024-01-07 NOTE — Telephone Encounter (Addendum)
 Submitted a Prior Authorization request to [employer] for ENBREL  via CoverMyMeds. Will update once we receive a response.  Key: [id number]  Sherry Pennant, PharmD, MPH, BCPS, CPP Clinical Pharmacist (Rheumatology and Pulmonology)  ----- Message from Sherry GORMAN Pennant sent at 01/06/2024 11:43 AM EST ----- Regarding: Enbrel  New Start Pending baseline labs and chest xray, please start Enbrel  BIV  Dose: 50mg  SQ every 7 days Dx: RA  She is not eligible for [employer] PAP due to income. But she is comfortable with $100 per month copay with her [facility] Plan Medicare

## 2024-01-07 NOTE — Progress Notes (Signed)
Immunoglobulins WNL Hep B and C negative

## 2024-01-08 ENCOUNTER — Other Ambulatory Visit: Payer: Self-pay | Admitting: Physician Assistant

## 2024-01-08 ENCOUNTER — Encounter: Payer: Self-pay | Admitting: Physical Therapy

## 2024-01-08 ENCOUNTER — Other Ambulatory Visit: Payer: Self-pay | Admitting: Rheumatology

## 2024-01-08 DIAGNOSIS — M0579 Rheumatoid arthritis with rheumatoid factor of multiple sites without organ or systems involvement: Secondary | ICD-10-CM

## 2024-01-08 NOTE — Telephone Encounter (Signed)
 Last Fill: 10/14/2023  Eye exam: 09/05/2023 WNL    Labs: 11/11/2023 CBC stable. CMP WNL   Next Visit: 04/14/2024  Last Visit: 01/06/2024  IK:Myzlfjunpi arthritis involving multiple sites with positive rheumatoid factor   Current Dose per office note 01/06/2024: Plaquenil  200 mg 1 tablet by mouth BID M-F   Okay to refill Plaquenil ?

## 2024-01-09 ENCOUNTER — Other Ambulatory Visit: Payer: Self-pay | Admitting: Physician Assistant

## 2024-01-09 ENCOUNTER — Other Ambulatory Visit: Payer: Self-pay | Admitting: Rheumatology

## 2024-01-09 DIAGNOSIS — Z79899 Other long term (current) drug therapy: Secondary | ICD-10-CM

## 2024-01-09 DIAGNOSIS — M0579 Rheumatoid arthritis with rheumatoid factor of multiple sites without organ or systems involvement: Secondary | ICD-10-CM

## 2024-01-09 NOTE — Telephone Encounter (Signed)
 Last Fill: 10/24/2023  Labs: 11/11/2023 CBC stable. CMP WNL   Next Visit: 04/14/2024  Last Visit: 01/06/2024  DX: Rheumatoid arthritis involving multiple sites with positive rheumatoid factor   Current Dose per office note 01/06/2024:  Methotrexate  0.8 ml sq inj wk   Okay to refill Methotrexate ?

## 2024-01-10 ENCOUNTER — Other Ambulatory Visit (HOSPITAL_COMMUNITY): Payer: Self-pay

## 2024-01-10 LAB — PROTEIN ELECTROPHORESIS, SERUM, WITH REFLEX
Albumin ELP: 4.2 g/dL (ref 3.8–4.8)
Alpha 1: 0.3 g/dL (ref 0.2–0.3)
Alpha 2: 0.9 g/dL (ref 0.5–0.9)
Beta 2: 0.4 g/dL (ref 0.2–0.5)
Beta Globulin: 0.4 g/dL (ref 0.4–0.6)
Gamma Globulin: 0.7 g/dL — ABNORMAL LOW (ref 0.8–1.7)
Total Protein: 6.9 g/dL (ref 6.1–8.1)

## 2024-01-10 LAB — IGG, IGA, IGM
IgG (Immunoglobin G), Serum: 769 mg/dL (ref 600–1540)
IgM, Serum: 69 mg/dL (ref 50–300)
Immunoglobulin A: 258 mg/dL (ref 70–320)

## 2024-01-10 LAB — QUANTIFERON-TB GOLD PLUS
Mitogen-NIL: 1.27 [IU]/mL
NIL: 0.01 [IU]/mL
QuantiFERON-TB Gold Plus: NEGATIVE
TB1-NIL: 0.02 [IU]/mL
TB2-NIL: 0.02 [IU]/mL

## 2024-01-10 LAB — HEPATITIS B CORE ANTIBODY, IGM: Hep B C IgM: NONREACTIVE

## 2024-01-10 LAB — HEPATITIS B SURFACE ANTIGEN: Hepatitis B Surface Ag: NONREACTIVE

## 2024-01-10 LAB — HEPATITIS C ANTIBODY: Hepatitis C Ab: NONREACTIVE

## 2024-01-10 LAB — IFE INTERPRETATION

## 2024-01-10 NOTE — Telephone Encounter (Signed)
 Received notification from [employer] regarding a prior authorization for ENBREL . Authorization has been APPROVED from 01/07/2024 to 12/02/2024. Approval letter sent to scan center.  Per test claim, copay for 28 days supply is $100  Patient can fill through [facility]: [phone]   Patient can be scheduled for Enbrel  new start  Ahman Dugdale, PharmD, MPH, BCPS, CPP Clinical Pharmacist (Rheumatology and Pulmonology)

## 2024-01-13 NOTE — Telephone Encounter (Signed)
 Patient scheduled for Enbrel  new start on 01/14/2024. Patient denies infection, antibiotic use, and any upcoming surgeries.

## 2024-01-13 NOTE — Progress Notes (Unsigned)
Pharmacy Note  Subjective:   Patient presents to clinic today to receive first dose of Enbrel for rheumatoid arthritis. Patient currently takes hydroxychloroquine and methotrexate 20 mg weekly, hydroxychloroquine 200 mg BID, and folic acid 2 mg daily.  She is to complete her prednisone taper tomorrow.  Patient running a fever or have signs/symptoms of infection? No  Patient currently on antibiotics for the treatment of infection? No  Patient have any upcoming invasive procedures/surgeries? No  Objective: CMP     Component Value Date/Time   NA 140 11/11/2023 0840   NA 141 06/22/2016 0000   NA 143 07/02/2013 0922   K 4.2 11/11/2023 0840   K 3.9 07/02/2013 0922   CL 104 11/11/2023 0840   CL 103 12/31/2012 0850   CO2 27 11/11/2023 0840   CO2 27 07/02/2013 0922   GLUCOSE 85 11/11/2023 0840   GLUCOSE 92 07/02/2013 0922   GLUCOSE 96 12/31/2012 0850   BUN 14 11/11/2023 0840   BUN 17 06/22/2016 0000   BUN 17.4 07/02/2013 0922   CREATININE 0.89 11/11/2023 0840   CREATININE 0.9 07/02/2013 0922   CALCIUM 9.5 11/11/2023 0840   CALCIUM 9.5 07/02/2013 0922   PROT 6.9 01/06/2024 1052   PROT 6.9 07/02/2013 0922   ALBUMIN 3.8 06/20/2017 0958   ALBUMIN 3.6 07/02/2013 0922   AST 27 11/11/2023 0840   AST 24 07/02/2013 0922   ALT 19 11/11/2023 0840   ALT 18 07/02/2013 0922   ALKPHOS 53 06/20/2017 0958   ALKPHOS 59 07/02/2013 0922   BILITOT 0.3 11/11/2023 0840   BILITOT 0.31 07/02/2013 0922   GFRNONAA 78 03/16/2021 1332   GFRAA 90 03/16/2021 1332    CBC    Component Value Date/Time   WBC 5.6 11/11/2023 0840   RBC 3.72 (L) 11/11/2023 0840   HGB 12.7 11/11/2023 0840   HGB 11.9 07/02/2013 0921   HCT 37.7 11/11/2023 0840   HCT 35.1 07/02/2013 0921   PLT 223 11/11/2023 0840   PLT 216 07/02/2013 0921   MCV 101.3 (H) 11/11/2023 0840   MCV 94.4 07/02/2013 0921   MCH 34.1 (H) 11/11/2023 0840   MCHC 33.7 11/11/2023 0840   RDW 14.1 11/11/2023 0840   RDW 14.3 07/02/2013 0921    LYMPHSABS 1,373 08/23/2023 0844   LYMPHSABS 1.5 07/02/2013 0921   MONOABS 486 06/20/2017 0958   MONOABS 0.3 07/02/2013 0921   EOSABS 140 11/11/2023 0840   EOSABS 0.1 07/02/2013 0921   BASOSABS 90 11/11/2023 0840   BASOSABS 0.0 07/02/2013 0921    Baseline Immunosuppressant Therapy Labs TB GOLD    Latest Ref Rng & Units 01/06/2024   10:52 AM  Quantiferon TB Gold  Quantiferon TB Gold Plus NEGATIVE NEGATIVE    Hepatitis Panel    Latest Ref Rng & Units 01/06/2024   10:52 AM  Hepatitis  Hep B Surface Ag NON-REACTIVE NON-REACTIVE   Hep B IgM NON-REACTIVE NON-REACTIVE   Hep C Ab NON-REACTIVE NON-REACTIVE    HIV No results found for: "HIV" Immunoglobulins    Latest Ref Rng & Units 01/06/2024   10:52 AM  Immunoglobulin Electrophoresis  IgA  70 - 320 mg/dL 409   IgG 811 - 9,147 mg/dL 829   IgM 50 - 562 mg/dL 69    SPEP    Latest Ref Rng & Units 01/06/2024   10:52 AM  Serum Protein Electrophoresis  Total Protein 6.1 - 8.1 g/dL 6.9   Albumin 3.8 - 4.8 g/dL 4.2   Alpha-1 0.2 - 0.3 g/dL  0.3   Alpha-2 0.5 - 0.9 g/dL 0.9   Beta Globulin 0.4 - 0.6 g/dL 0.4   Beta 2 0.2 - 0.5 g/dL 0.4   Gamma Globulin 0.8 - 1.7 g/dL 0.7   Interpretation  --    G6PD No results found for: "G6PDH" TPMT No results found for: "TPMT"    Assessment/Plan:  Reviewed importance of holding Enbrel with signs/symptoms of an infections, if antibiotics are prescribed to treat an active infection, and with invasive procedures  Demonstrated proper injection technique with AutoTouch Connect  demo device  Patient able to demonstrate proper injection technique using the teach back method.  Patient self injected in the right upper thigh with:  AutoTouch Connect Device NDC: 47829-562-13 Lot: [id number] Expiraiton: 07/02/2026  Sample Medication: EnbrelMini prefilled cartridge NDC: 69629-528-41 Lot: [id number] Expiration: 08/02/2025  Patient tolerated well.  Observed for 30 mins in office for adverse reaction.  Patient denies itchiness and irritation at injection., No swelling or redness noted., and Reviewed injection site reaction management with patient verbally and printed information for review in AVS  Patient is to return in 1 month for labs and 6-8 weeks for follow-up appointment.  Standing orders for CBC/CMP placed. TB gold will be monitored yearly.  Referral to [facility] placed today for yearly skin checks while on TNF inhibitor due to risk for non melanoma skin cancer  Enbrel approved through insurance. Rx sent to: [facility]: [phone] Patient provided with pharmacy phone number and advised to call later this week to schedule shipment to home.  Patient will continue Enbrel 50 mg subcut every 7 days in combination with methotrexate, hydroxychloroquine, and folic acid  All questions encouraged and answered. Instructed patient to call with any further questions or concerns.  Chesley Mires, PharmD, MPH, BCPS, CPP Clinical Pharmacist (Rheumatology and Pulmonology)  Sofie Rower, PharmD [facility] PGY-1   01/13/2024 3:25 PM

## 2024-01-14 ENCOUNTER — Ambulatory Visit: Payer: Medicare PPO | Attending: Rheumatology | Admitting: Pharmacist

## 2024-01-14 DIAGNOSIS — Z79899 Other long term (current) drug therapy: Secondary | ICD-10-CM

## 2024-01-14 DIAGNOSIS — M0579 Rheumatoid arthritis with rheumatoid factor of multiple sites without organ or systems involvement: Secondary | ICD-10-CM

## 2024-01-14 DIAGNOSIS — Z7189 Other specified counseling: Secondary | ICD-10-CM

## 2024-01-14 MED ORDER — ENBREL MINI 50 MG/ML ~~LOC~~ SOCT
50.0000 mg | SUBCUTANEOUS | 0 refills | Status: DC
  Filled 2024-01-15: qty 4, 28d supply, fill #0
  Filled 2024-01-31: qty 4, 28d supply, fill #1
  Filled 2024-02-24: qty 4, 28d supply, fill #2

## 2024-01-14 NOTE — Patient Instructions (Signed)
Your next ENBREL dose is due on 01/21/2024, 01/28/2024, and every 7 days thereafter  CONTINUE Hydroxychloroquine, Methotrexate, and Folic acid  HOLD ENBREL if you have signs or symptoms of an infection. You can resume once you feel better or back to your baseline. HOLD ENBREL if you start antibiotics to treat an infection. HOLD ENBREL around the time of surgery/procedures. Your surgeon will be able to provide recommendations on when to hold BEFORE and when you are cleared to RESUME.  Pharmacy information: Your prescription will be shipped from [facility]. Their phone number is [phone] Please call to schedule shipment and confirm address. They will mail your medication to your home.  Labs are due in 1 month then every 3 months. Lab hours are from Monday to Thursday 8am-12:30pm and 1pm-5pm and Friday 8am-12pm. You do not need an appointment if you come for labs during these times. If you'd like to go to a [facility] or [facility] closer to home, please call our clinic 48 hours prior to lab date so we can release orders in a timely manner.  Stay up to date on all routine vaccines: influenza, pneumonia, COVID19, Shingles  How to manage an injection site reaction: Remember the 5 C's: COUNTER - leave on the counter at least 30 minutes but up to overnight to bring medication to room temperature. This may help prevent stinging COLD - place something cold (like an ice gel pack or cold water bottle) on the injection site just before cleansing with alcohol. This may help reduce pain CLARITIN - use Claritin (generic name is loratadine) for the first two weeks of treatment or the day of, the day before, and the day after injecting. This will help to minimize injection site reactions CORTISONE CREAM - apply if injection site is irritated and itching CALL ME - if injection site reaction is bigger than the size of your fist, looks infected, blisters, or if you develop hives

## 2024-01-15 ENCOUNTER — Other Ambulatory Visit: Payer: Self-pay

## 2024-01-15 NOTE — Progress Notes (Signed)
Specialty Pharmacy Initial Fill Coordination Note  Melissa Perez is a 78 y.o. female contacted today regarding initial fill of specialty medication(s) Etanercept (Enbrel Mini)   Patient requested Delivery   Delivery date: 01/17/24   Verified address: [address]   [place] [address]   Medication will be filled on 01/16/24.   Patient is aware of $100 copayment. CC info collected and forwarded to [facility].

## 2024-01-16 ENCOUNTER — Other Ambulatory Visit: Payer: Self-pay

## 2024-01-17 NOTE — Progress Notes (Signed)
Patient started Enbrel in office on 01/14/2024 with Carin Primrose, PharmD resident.  Tolerated injection well.  Continue ENBREL 50mg  SQ once weekly CONTINUE methotrexate 20mg  subcut once weekly with folic acid 2mg  daily CONTINUE hydroxychloroquine 200mg  twice daily Monday through Friday.  COMPLETE prednisone taper as prescribed

## 2024-01-19 NOTE — Progress Notes (Signed)
CXR was negative for active cardiopulmonary disease

## 2024-01-28 ENCOUNTER — Other Ambulatory Visit: Payer: Self-pay

## 2024-01-28 ENCOUNTER — Other Ambulatory Visit (HOSPITAL_COMMUNITY): Payer: Self-pay

## 2024-01-28 NOTE — Progress Notes (Signed)
 Per Rheumatology [facility], patient should go ahead and administer another Enbrel now.   Ran a test claim and insurance will pay for next refill on 02/06/24.

## 2024-01-28 NOTE — Progress Notes (Signed)
 Patient called to inform pharmacy she misfired the Enbrel mini with her first at home administration. Patient reports that she was standing and started to injection in stomach when she prematurely hit the status button to initiate the injection. Patient reports she might of received "1/10 of the medication in the vial."   Patient would like to know if she needs to use another Enbrel today or wait until the next dose is due. Pharmacy reached out to Rheumatology clinic to confirm.

## 2024-01-31 ENCOUNTER — Ambulatory Visit: Payer: Medicare PPO | Admitting: Physician Assistant

## 2024-01-31 ENCOUNTER — Other Ambulatory Visit: Payer: Self-pay

## 2024-01-31 ENCOUNTER — Other Ambulatory Visit (HOSPITAL_COMMUNITY): Payer: Self-pay

## 2024-01-31 NOTE — Progress Notes (Signed)
 Specialty Pharmacy Refill Coordination Note  Melissa Perez is a 78 y.o. female contacted today regarding refills of specialty medication(s) Etanercept (Enbrel Mini)   Patient requested Delivery   Delivery date: 02/07/24   Verified address: [address]   [place] [address]   Medication will be filled on 02/06/24.

## 2024-02-06 ENCOUNTER — Other Ambulatory Visit: Payer: Self-pay

## 2024-02-11 NOTE — Progress Notes (Unsigned)
 Office Visit Note  Patient: Melissa Perez             Date of Birth: [birth year]           MRN: [id number]             PCP: Renaye Rakers, MD Referring: Renaye Rakers, MD Visit Date: 02/25/2024 Occupation: @GUAROCC @  Subjective:  Medication monitoring   History of Present Illness: Melissa Perez is a 78 y.o. female with history of seropositive rheumatoid arthritis, osteoarthritis, and osteoporosis.  Patient remains Methotrexate 0.8 ml sq inj wk, folic acid 2 mg daily, Plaquenil 200 mg 1 tablet by mouth BID M-F.  She is also taking enbrel 50 mg sq injections once weekly.  Patient was started on Enbrel on 01/14/24.  She is tolerating Enbrel without any side effects or injection site reactions.  Patient states that she is due for her 6th dose of Enbrel today.  Patient states that she has had an 85% improvement in her symptoms since initiating Enbrel.  She has been able to limit Tylenol use significantly.  Patient states that she has not had any recent or recurrent infections.  Patient states that she received the pneumonia vaccine last week.    Activities of Daily Living:  Patient reports morning stiffness for 0 minutes.   Patient Denies nocturnal pain.  Difficulty dressing/grooming: Denies Difficulty climbing stairs: Denies Difficulty getting out of chair: Denies Difficulty using hands for taps, buttons, cutlery, and/or writing: Denies  Review of Systems  Constitutional:  Positive for fatigue.  HENT:  Negative for mouth sores, mouth dryness and nose dryness.   Eyes:  Negative for pain and dryness.  Respiratory:  Negative for shortness of breath and difficulty breathing.   Cardiovascular:  Negative for chest pain and palpitations.  Gastrointestinal:  Negative for blood in stool, constipation and diarrhea.  Endocrine: Negative for increased urination.  Genitourinary:  Negative for involuntary urination.  Musculoskeletal:  Positive for joint pain and joint pain. Negative for gait problem, joint  swelling, myalgias, muscle weakness, morning stiffness, muscle tenderness and myalgias.  Skin:  Positive for sensitivity to sunlight. Negative for color change, rash and hair loss.  Allergic/Immunologic: Positive for susceptible to infections.  Neurological:  Negative for dizziness and headaches.  Hematological:  Negative for swollen glands.  Psychiatric/Behavioral:  Negative for depressed mood and sleep disturbance. The patient is not nervous/anxious.     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   S/P lumpectomy of breast 12/03/2011   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HCC) 11/01/2011    Past Medical History:  Diagnosis Date   Arthritis    rheumatoid, take Humira   Arthritis    rheumatoid   Arthritis pain    Back pain    Breast cancer (HCC)    Cancer (HCC)    History of dental surgery    Hyperlipidemia    Hypertension    Personal history of radiation therapy  Rash    Rheumatoid arthritis (HCC)    S/P lumpectomy of breast 12/03/2011    Family History  Problem Relation Age of Onset   Breast cancer Mother    Cancer Mother    Kidney cancer Father    Cancer Father    Breast cancer Sister    Cancer Sister    Lung cancer Sister    Colon cancer Maternal Uncle    Liver cancer Paternal Aunt    Past Surgical History:  Procedure Laterality Date   ABDOMINAL HYSTERECTOMY     APPENDECTOMY     BREAST EXCISIONAL BIOPSY Left 1991    benign   BREAST LUMPECTOMY Right 11/2011   BREAST SURGERY     2 left breast biopsies-benign   left breast biopsies  1991 & 1990   Social History   Social History Narrative   Adult Son in the [employer] History  Administered Date(s) Administered   Fluad Quad(high Dose 65+) 08/12/2019   Influenza, High Dose Seasonal PF 09/10/2017, 09/14/2020   Influenza-Unspecified 07/03/2018   PFIZER(Purple Top)SARS-COV-2 Vaccination 01/14/2020, 02/06/2020, 08/05/2020, 04/20/2021   Pfizer Covid-19 Vaccine Bivalent Booster 49yrs & up 09/19/2021   Zoster, Live 11/24/2012     Objective: Vital Signs: BP 126/77 (BP Location: Left Arm, Patient Position: Sitting, Cuff Size: Normal)   Pulse 94   Resp 13   Ht 5' 1.75" (1.568 m)   Wt 136 lb 6.4 oz (61.9 kg)   BMI 25.15 kg/m    Physical Exam Vitals and nursing note reviewed.  Constitutional:      Appearance: She is well-developed.  HENT:     Head: Normocephalic and atraumatic.  Eyes:     Conjunctiva/sclera: Conjunctivae normal.  Cardiovascular:     Rate and Rhythm: Normal rate and regular rhythm.     Heart sounds: Normal heart sounds.  Pulmonary:     Effort: Pulmonary effort is normal.     Breath sounds: Normal breath sounds.  Abdominal:     General: Bowel sounds are normal.     Palpations: Abdomen is soft.  Musculoskeletal:     Cervical back: Normal range of motion.  Lymphadenopathy:     Cervical: No cervical adenopathy.  Skin:    General: Skin is warm and dry.     Capillary Refill: Capillary refill takes less than 2 seconds.  Neurological:     Mental Status: She is alert and oriented to person, place, and time.  Psychiatric:        Behavior: Behavior normal.      Musculoskeletal Exam: C-spine, thoracic spine, lumbar spine good range of motion.  No midline spinal tenderness in the lumbar region as well as tenderness over the right SI joint.  Shoulder joints have good range of motion with no discomfort.  Elbow joints have  good range of motion.  Limited extension of the right wrist.  No tenderness or synovitis over MCP joints.  Some tenderness of the first [employer] joint bilaterally.  Hip joints have good range of motion with no groin pain.  Knee joints have good range of motion with no warmth or effusion.  Ankle joints have good range of motion with no tenderness or joint swelling.  No tenderness or synovitis over MTP joints.  CDAI Exam: CDAI Score: -- Patient Global: --; Provider Global: -- Swollen: --; Tender: -- Joint Exam 02/25/2024   No joint exam has been documented for this visit   There is currently no information documented on the homunculus. Go to  the Rheumatology activity and complete the homunculus joint exam.  Investigation: No additional findings.  Imaging: No results found.  Recent Labs: Lab Results  Component Value Date   WBC 4.0 02/12/2024   HGB 12.3 02/12/2024   PLT 234 02/12/2024   NA 140 02/12/2024   K 4.5 02/12/2024   CL 101 02/12/2024   CO2 30 02/12/2024   GLUCOSE 78 02/12/2024   BUN 15 02/12/2024   CREATININE 0.93 02/12/2024   BILITOT 0.4 02/12/2024   ALKPHOS 53 06/20/2017   AST 29 02/12/2024   ALT 21 02/12/2024   PROT 6.4 02/12/2024   ALBUMIN 3.8 06/20/2017   CALCIUM 9.8 02/12/2024   GFRAA 90 03/16/2021   QFTBGOLDPLUS NEGATIVE 01/06/2024    Speciality Comments: PLQ Eye Exam: 09/05/2023 WNL @ [facility] [place]. Follow up in 6 months. Humira-SEs  Procedures:  No procedures performed Allergies: Dilaudid [hydromorphone], Dilaudid [hydromorphone hcl], and Hydrocodone    Assessment / Plan:     Visit Diagnoses: Rheumatoid arthritis involving multiple sites with positive rheumatoid factor (HCC): She has no synovitis on examination today.  She has noticed an 85% improvement in her symptoms since initiating Enbrel on 01/14/2024.  She continues to have some soreness in both thumbs but her shoulder pain and knee joint pain have been more manageable.  She has been  able to significantly reduce her intake of Tylenol.  Her energy level has also started to improve.  She was hoping to start performing yard work with the spring weather temperatures.  She is tolerating Enbrel without any side effects or injection site reactions.  She has not had any recent gaps in therapy.  She is due for her sixth dose of Enbrel today.  Patient will remain on Enbrel 50 mg subcutaneous injections once weekly, methotrexate 0.8 mL or subcu injections once weekly, folic acid 2 mg daily, Plaquenil 200 mg 1 tablet by mouth twice daily Monday to Friday.  Plan to discontinue Plaquenil at her next office visit if she continues to improve clinically.  She will follow-up in the office in 2 to 3 months or sooner if needed.  High risk medication use - Enbrel 50 mg sq inj once weekly, Methotrexate 0.8 ml sq inj wk, folic acid 2 mg daily, Plaquenil 200 mg 1 tablet by mouth BID M-F.  Previous ZO:XWRU MTX,Humira. Patient was started on Enbrel on 01/14/2024 CBC and CMP updated on 02/12/24. Her next lab work will be due in June and every 3 months.  TB gold negative on 01/06/24.  PLQ Eye Exam: 09/05/2023 WNL @ [facility] [place].  No recent or recurrent infections.  Discussed the importance of holding enbrel and methotrexate if she develops signs or symptoms of an infection and to resume once the infection has completely cleared.   PPD positive  Primary osteoarthritis of both knees: She has good range of motion of both knee joints on examination today.  No warmth or effusion noted.  Spondylosis of lumbar spine: She continues to have ongoing discomfort in her lower back.  She has midline spinal tenderness in the lumbar region as well as some tenderness over the right SI joint.  Discussed the importance of core strengthening.  Chronic right SI joint pain: Patient continues to experience some discomfort in her lower back.  She is having some midline spinal tenderness in the lumbar region as well as  tenderness over the right SI joint.  Age-related osteoporosis without current pathological fracture: DEXA 02/05/2023: Right femoral neck BMD 0.598 with  T-score -1.3.  Taking a calcium and vitamin D supplement daily.    Vitamin D deficiency: Vitamin D was 42 on 08/23/2023.  Medication monitoring encounter: -Previously receiving Prolia injections through Dr. Tedra Senegal office.    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 CXR 2013.  History of hypertension: Blood pressure is 126/77 today in the office.  History of pityriasis rosea  Degeneration of intervertebral disc of lumbar region without discogenic back pain or lower extremity pain  Orders: No orders of the defined types were placed in this encounter.  No orders of the defined types were placed in this encounter.  Follow-Up Instructions: Return in about 3 months (around 05/27/2024) for Rheumatoid arthritis.   Gearldine Bienenstock, PA-C  Note - This record has been created using Dragon software.  Chart creation errors have been sought, but may not always  have been located. Such creation errors do not reflect on  the standard of medical care.

## 2024-02-12 ENCOUNTER — Other Ambulatory Visit: Payer: Self-pay | Admitting: *Deleted

## 2024-02-12 DIAGNOSIS — Z79899 Other long term (current) drug therapy: Secondary | ICD-10-CM | POA: Diagnosis not present

## 2024-02-12 NOTE — Progress Notes (Signed)
 RBC count remains borderline low but is improving.  Rest of CBC WNL.

## 2024-02-13 LAB — CBC WITH DIFFERENTIAL/PLATELET
Absolute Lymphocytes: 1732 {cells}/uL (ref 850–3900)
Absolute Monocytes: 328 {cells}/uL (ref 200–950)
Basophils Absolute: 88 {cells}/uL (ref 0–200)
Basophils Relative: 2.2 %
Eosinophils Absolute: 80 {cells}/uL (ref 15–500)
Eosinophils Relative: 2 %
HCT: 37 % (ref 35.0–45.0)
Hemoglobin: 12.3 g/dL (ref 11.7–15.5)
MCH: 32.8 pg (ref 27.0–33.0)
MCHC: 33.2 g/dL (ref 32.0–36.0)
MCV: 98.7 fL (ref 80.0–100.0)
MPV: 10.4 fL (ref 7.5–12.5)
Monocytes Relative: 8.2 %
Neutro Abs: 1772 {cells}/uL (ref 1500–7800)
Neutrophils Relative %: 44.3 %
Platelets: 234 10*3/uL (ref 140–400)
RBC: 3.75 10*6/uL — ABNORMAL LOW (ref 3.80–5.10)
RDW: 12.8 % (ref 11.0–15.0)
Total Lymphocyte: 43.3 %
WBC: 4 10*3/uL (ref 3.8–10.8)

## 2024-02-13 LAB — COMPLETE METABOLIC PANEL WITH GFR
AG Ratio: 2.2 (calc) (ref 1.0–2.5)
ALT: 21 U/L (ref 6–29)
AST: 29 U/L (ref 10–35)
Albumin: 4.4 g/dL (ref 3.6–5.1)
Alkaline phosphatase (APISO): 80 U/L (ref 37–153)
BUN: 15 mg/dL (ref 7–25)
CO2: 30 mmol/L (ref 20–32)
Calcium: 9.8 mg/dL (ref 8.6–10.4)
Chloride: 101 mmol/L (ref 98–110)
Creat: 0.93 mg/dL (ref 0.60–1.00)
Globulin: 2 g/dL (ref 1.9–3.7)
Glucose, Bld: 78 mg/dL (ref 65–99)
Potassium: 4.5 mmol/L (ref 3.5–5.3)
Sodium: 140 mmol/L (ref 135–146)
Total Bilirubin: 0.4 mg/dL (ref 0.2–1.2)
Total Protein: 6.4 g/dL (ref 6.1–8.1)
eGFR: 64 mL/min/{1.73_m2} (ref >=60)

## 2024-02-13 NOTE — Progress Notes (Signed)
 CMP WNL

## 2024-02-20 ENCOUNTER — Other Ambulatory Visit: Payer: Self-pay

## 2024-02-24 ENCOUNTER — Other Ambulatory Visit: Payer: Self-pay

## 2024-02-24 ENCOUNTER — Other Ambulatory Visit: Payer: Self-pay | Admitting: Pharmacy Technician

## 2024-02-24 NOTE — Progress Notes (Signed)
 Specialty Pharmacy Refill Coordination Note  Melissa Perez is a 78 y.o. female contacted today regarding refills of specialty medication(s) Etanercept (Enbrel Mini)   Patient requested Delivery   Delivery date: 03/04/24   Verified address: [address]  [place]   Medication will be filled on 03/03/24.

## 2024-02-25 ENCOUNTER — Encounter: Payer: Self-pay | Admitting: Physician Assistant

## 2024-02-25 ENCOUNTER — Ambulatory Visit: Payer: Medicare PPO | Attending: Physician Assistant | Admitting: Physician Assistant

## 2024-02-25 VITALS — BP 126/77 | HR 94 | Resp 13 | Ht 61.75 in | Wt 136.4 lb

## 2024-02-25 DIAGNOSIS — M47816 Spondylosis without myelopathy or radiculopathy, lumbar region: Secondary | ICD-10-CM | POA: Diagnosis not present

## 2024-02-25 DIAGNOSIS — Z5181 Encounter for therapeutic drug level monitoring: Secondary | ICD-10-CM

## 2024-02-25 DIAGNOSIS — M533 Sacrococcygeal disorders, not elsewhere classified: Secondary | ICD-10-CM

## 2024-02-25 DIAGNOSIS — M810 Age-related osteoporosis without current pathological fracture: Secondary | ICD-10-CM | POA: Diagnosis not present

## 2024-02-25 DIAGNOSIS — E559 Vitamin D deficiency, unspecified: Secondary | ICD-10-CM

## 2024-02-25 DIAGNOSIS — M170 Bilateral primary osteoarthritis of knee: Secondary | ICD-10-CM

## 2024-02-25 DIAGNOSIS — R7611 Nonspecific reaction to tuberculin skin test without active tuberculosis: Secondary | ICD-10-CM | POA: Diagnosis not present

## 2024-02-25 DIAGNOSIS — Z853 Personal history of malignant neoplasm of breast: Secondary | ICD-10-CM

## 2024-02-25 DIAGNOSIS — M0579 Rheumatoid arthritis with rheumatoid factor of multiple sites without organ or systems involvement: Secondary | ICD-10-CM | POA: Diagnosis not present

## 2024-02-25 DIAGNOSIS — Z872 Personal history of diseases of the skin and subcutaneous tissue: Secondary | ICD-10-CM

## 2024-02-25 DIAGNOSIS — M51369 Other intervertebral disc degeneration, lumbar region without mention of lumbar back pain or lower extremity pain: Secondary | ICD-10-CM

## 2024-02-25 DIAGNOSIS — G8929 Other chronic pain: Secondary | ICD-10-CM

## 2024-02-25 DIAGNOSIS — Z79899 Other long term (current) drug therapy: Secondary | ICD-10-CM | POA: Diagnosis not present

## 2024-02-25 DIAGNOSIS — Z8679 Personal history of other diseases of the circulatory system: Secondary | ICD-10-CM

## 2024-02-25 NOTE — Patient Instructions (Signed)

## 2024-03-07 ENCOUNTER — Other Ambulatory Visit: Payer: Self-pay | Admitting: Physician Assistant

## 2024-03-09 NOTE — Telephone Encounter (Signed)
 Last Fill: 03/25/2023  Next Visit: 05/27/2024  Last Visit: 02/25/2024  Dx: Rheumatoid arthritis involving multiple sites with positive rheumatoid factor   Current Dose per office note on 02/25/2024: folic acid 2 mg daily   Okay to refill Folic Acid?

## 2024-03-25 ENCOUNTER — Other Ambulatory Visit: Payer: Self-pay

## 2024-03-25 ENCOUNTER — Other Ambulatory Visit: Payer: Self-pay | Admitting: Pharmacy Technician

## 2024-03-25 ENCOUNTER — Other Ambulatory Visit: Payer: Self-pay | Admitting: Physician Assistant

## 2024-03-25 DIAGNOSIS — M0579 Rheumatoid arthritis with rheumatoid factor of multiple sites without organ or systems involvement: Secondary | ICD-10-CM

## 2024-03-25 DIAGNOSIS — Z79899 Other long term (current) drug therapy: Secondary | ICD-10-CM

## 2024-03-25 NOTE — Progress Notes (Signed)
 Specialty Pharmacy Refill Coordination Note  Melissa Perez is a 78 y.o. female contacted today regarding refills of specialty medication(s) Etanercept  (Enbrel  Mini)   Patient requested (Patient-Rptd) Delivery   Delivery date: (Patient-Rptd) 03/31/24   Verified address: (Patient-Rptd) [address], [place], [place] [address]   Medication will be filled on 03/30/24.   RR sent to MD

## 2024-03-26 ENCOUNTER — Other Ambulatory Visit: Payer: Self-pay | Admitting: Physician Assistant

## 2024-03-26 ENCOUNTER — Other Ambulatory Visit: Payer: Self-pay

## 2024-03-26 ENCOUNTER — Other Ambulatory Visit (HOSPITAL_COMMUNITY): Payer: Self-pay

## 2024-03-26 DIAGNOSIS — M0579 Rheumatoid arthritis with rheumatoid factor of multiple sites without organ or systems involvement: Secondary | ICD-10-CM

## 2024-03-26 DIAGNOSIS — Z79899 Other long term (current) drug therapy: Secondary | ICD-10-CM

## 2024-03-26 MED ORDER — ENBREL MINI 50 MG/ML ~~LOC~~ SOCT
50.0000 mg | SUBCUTANEOUS | 0 refills | Status: DC
  Filled 2024-03-26: qty 4, 28d supply, fill #0
  Filled 2024-04-15: qty 4, 28d supply, fill #1
  Filled 2024-05-28: qty 4, 28d supply, fill #2

## 2024-03-26 NOTE — Telephone Encounter (Signed)
 Last Fill: 01/14/2024  Labs: 02/12/2024 CMP WNL RBC count remains borderline low but is improving.  Rest of CBC WNL.     TB Gold: 01/06/2024   Next Visit: 05/27/2024  Last Visit: 02/25/2024  WJ:[id number] arthritis involving multiple sites with positive rheumatoid factor   Current Dose per office note 02/25/2024: Enbrel  50 mg sq inj once weekly   Okay to refill Enbrel ?

## 2024-03-26 NOTE — Telephone Encounter (Signed)
 Last Fill: 01/09/2024  Labs: 02/12/2024 RBC count remains borderline low but is improving.  Rest of CBC WNL.   CMP WNL   Next Visit: 05/27/2024  Last Visit: 3/25  DX:  Rheumatoid arthritis involving multiple sites with positive rheumatoid factor   Current Dose per office note 02/25/2024: Methotrexate  0.8 ml sq inj wk   Okay to refill Methotrexate ?

## 2024-03-30 ENCOUNTER — Other Ambulatory Visit: Payer: Self-pay | Admitting: Physician Assistant

## 2024-03-30 DIAGNOSIS — M0579 Rheumatoid arthritis with rheumatoid factor of multiple sites without organ or systems involvement: Secondary | ICD-10-CM

## 2024-03-30 NOTE — Telephone Encounter (Signed)
 Last Fill: 01/08/2024  Eye exam: 09/05/2023 WNL   Labs: 02/12/2024 CMP WNL RBC count remains borderline low but is improving.  Rest of CBC WNL.     Next Visit: 05/27/2024  Last Visit: 02/25/2024  ZO:[id number] arthritis involving multiple sites with positive rheumatoid factor   Current Dose per office note 02/25/2024: Plaquenil  200 mg 1 tablet by mouth BID M-F.   Okay to refill Plaquenil ?

## 2024-04-02 DIAGNOSIS — Z79899 Other long term (current) drug therapy: Secondary | ICD-10-CM | POA: Diagnosis not present

## 2024-04-02 DIAGNOSIS — H2513 Age-related nuclear cataract, bilateral: Secondary | ICD-10-CM | POA: Diagnosis not present

## 2024-04-02 DIAGNOSIS — M0570 Rheumatoid arthritis with rheumatoid factor of unspecified site without organ or systems involvement: Secondary | ICD-10-CM | POA: Diagnosis not present

## 2024-04-02 DIAGNOSIS — H43813 Vitreous degeneration, bilateral: Secondary | ICD-10-CM | POA: Diagnosis not present

## 2024-04-08 DIAGNOSIS — I10 Essential (primary) hypertension: Secondary | ICD-10-CM | POA: Diagnosis not present

## 2024-04-08 DIAGNOSIS — E782 Mixed hyperlipidemia: Secondary | ICD-10-CM | POA: Diagnosis not present

## 2024-04-08 DIAGNOSIS — R7303 Prediabetes: Secondary | ICD-10-CM | POA: Diagnosis not present

## 2024-04-14 ENCOUNTER — Ambulatory Visit: Payer: Medicare PPO | Admitting: Physician Assistant

## 2024-04-15 ENCOUNTER — Other Ambulatory Visit: Payer: Self-pay

## 2024-04-15 NOTE — Progress Notes (Signed)
 Specialty Pharmacy Refill Coordination Note  Melissa Perez is a 78 y.o. female contacted today regarding refills of specialty medication(s) Etanercept  (Enbrel  Mini)   Patient requested (Patient-Rptd) Delivery   Delivery date: (Patient-Rptd) 05/05/24   [place]  [phone]   Medication will be filled on 06.02.25.   Left voicemail for patient regarding questionnaire response for [facility].

## 2024-04-22 ENCOUNTER — Ambulatory Visit
Admission: RE | Admit: 2024-04-22 | Discharge: 2024-04-22 | Disposition: A | Discharge status: Home / Self Care | Source: Ambulatory Visit | Attending: Family Medicine | Admitting: Family Medicine

## 2024-04-22 ENCOUNTER — Other Ambulatory Visit: Payer: Self-pay | Admitting: Family Medicine

## 2024-04-22 DIAGNOSIS — Z1231 Encounter for screening mammogram for malignant neoplasm of breast: Secondary | ICD-10-CM

## 2024-05-04 ENCOUNTER — Other Ambulatory Visit: Payer: Self-pay

## 2024-05-12 ENCOUNTER — Other Ambulatory Visit: Payer: Self-pay | Admitting: *Deleted

## 2024-05-12 DIAGNOSIS — Z79899 Other long term (current) drug therapy: Secondary | ICD-10-CM

## 2024-05-12 LAB — COMPREHENSIVE METABOLIC PANEL WITH GFR
AG Ratio: 2.3 (calc) (ref 1.0–2.5)
ALT: 22 U/L (ref 6–29)
AST: 29 U/L (ref 10–35)
Albumin: 4.5 g/dL (ref 3.6–5.1)
Alkaline phosphatase (APISO): 87 U/L (ref 37–153)
BUN: 14 mg/dL (ref 7–25)
CO2: 28 mmol/L (ref 20–32)
Calcium: 9.7 mg/dL (ref 8.6–10.4)
Chloride: 104 mmol/L (ref 98–110)
Creat: 0.89 mg/dL (ref 0.60–1.00)
Globulin: 2 g/dL (ref 1.9–3.7)
Glucose, Bld: 93 mg/dL (ref 65–99)
Potassium: 4.8 mmol/L (ref 3.5–5.3)
Sodium: 140 mmol/L (ref 135–146)
Total Bilirubin: 0.5 mg/dL (ref 0.2–1.2)
Total Protein: 6.5 g/dL (ref 6.1–8.1)
eGFR: 67 mL/min/{1.73_m2} (ref >=60)

## 2024-05-12 LAB — CBC WITH DIFFERENTIAL/PLATELET
Absolute Lymphocytes: 1894 {cells}/uL (ref 850–3900)
Absolute Monocytes: 420 {cells}/uL (ref 200–950)
Basophils Absolute: 59 {cells}/uL (ref 0–200)
Basophils Relative: 1.4 %
Eosinophils Absolute: 59 {cells}/uL (ref 15–500)
Eosinophils Relative: 1.4 %
HCT: 38 % (ref 35.0–45.0)
Hemoglobin: 12.5 g/dL (ref 11.7–15.5)
MCH: 32.9 pg (ref 27.0–33.0)
MCHC: 32.9 g/dL (ref 32.0–36.0)
MCV: 100 fL (ref 80.0–100.0)
MPV: 10 fL (ref 7.5–12.5)
Monocytes Relative: 10 %
Neutro Abs: 1768 {cells}/uL (ref 1500–7800)
Neutrophils Relative %: 42.1 %
Platelets: 192 10*3/uL (ref 140–400)
RBC: 3.8 10*6/uL (ref 3.80–5.10)
RDW: 13.8 % (ref 11.0–15.0)
Total Lymphocyte: 45.1 %
WBC: 4.2 10*3/uL (ref 3.8–10.8)

## 2024-05-13 NOTE — Progress Notes (Unsigned)
 Office Visit Note  Patient: Melissa Perez             Date of Birth: August 24, 1946           MRN: [id number]             PCP: Benjamine Aland, MD Referring: Benjamine Aland, MD Visit Date: 05/27/2024 Occupation: @GUAROCC @  Subjective:  Increased joint pain   History of Present Illness: Melissa Perez is a 78 y.o. female with history of seropositive rheumatoid arthritis and osteoarthritis.  Patient remains on  Enbrel  50 mg sq inj weekly and Plaquenil  200 mg 1 tablet by mouth BID M-F.  Patient states that she has been out of her prescription for injectable methotrexate  for the past 1 month due to the pharmacy denying the refill.  She was unsure if she was supposed to continue methotrexate  or not.  For the past 2 to 3 weeks she has been experiencing increased arthralgias and joint stiffness particularly in both hips and both shoulders.  She has been performing yard work and at first thought her symptoms were related to overuse activities.  She is been having to take extra time Tylenol  every day or every other day for symptomatic relief.  She is having constant pain in both upper legs.  She denies any injury or fall prior to the onset of symptoms.  She denies any joint swelling.   Activities of Daily Living:  Patient reports morning stiffness for 2-3 minutes.  - Patient Denies nocturnal pain.  Difficulty dressing/grooming: Denies Difficulty climbing stairs: Reports Difficulty getting out of chair: Reports Difficulty using hands for taps, buttons, cutlery, and/or writing: Reports  Review of Systems  Constitutional:  Positive for fatigue.  HENT:  Negative for mouth sores and mouth dryness.   Eyes:  Negative for dryness.  Respiratory:  Negative for shortness of breath.   Cardiovascular:  Negative for chest pain and palpitations.  Gastrointestinal:  Negative for blood in stool, constipation and diarrhea.  Endocrine: Negative for increased urination.  Genitourinary:  Negative for involuntary urination.   Musculoskeletal:  Positive for joint pain, joint pain, myalgias (legs and shoulders), muscle weakness, morning stiffness, muscle tenderness and myalgias (legs and shoulders). Negative for gait problem and joint swelling.  Skin:  Positive for rash and sensitivity to sunlight. Negative for color change and hair loss.  Allergic/Immunologic: Positive for susceptible to infections.  Neurological:  Negative for dizziness and headaches.  Hematological:  Negative for swollen glands.  Psychiatric/Behavioral:  Positive for sleep disturbance. Negative for depressed mood. The patient is not nervous/anxious.     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   S/P lumpectomy of breast 12/03/2011   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HCC) 11/01/2011    Past Medical History:  Diagnosis Date   Arthritis    rheumatoid, take Humira   Arthritis    rheumatoid   Arthritis pain    Back pain    Breast cancer (HCC)  Cancer [facility])    History of dental surgery    Hyperlipidemia    Hypertension    Personal history of radiation therapy    Rash    Rheumatoid arthritis (HCC)    S/P lumpectomy of breast 12/03/2011    Family History  Problem Relation Age of Onset   Breast cancer Mother    Cancer Mother    Kidney cancer Father    Cancer Father    Breast cancer Sister    Cancer Sister    Lung cancer Sister    Colon cancer Maternal Uncle    Liver cancer Paternal  Aunt    Past Surgical History:  Procedure Laterality Date   ABDOMINAL HYSTERECTOMY     APPENDECTOMY     BREAST EXCISIONAL BIOPSY Left 1991   benign   BREAST LUMPECTOMY Right 11/2011   BREAST SURGERY     2 left breast biopsies-benign   left breast biopsies  1991 & 1990   Social History   Social History Narrative   Adult Son in the [employer] History  Administered Date(s) Administered   Fluad Quad(high Dose 65+) 08/12/2019   Influenza, High Dose Seasonal PF 09/10/2017, 09/14/2020   Influenza-Unspecified 07/03/2018   PFIZER(Purple Top)SARS-COV-2 Vaccination 01/14/2020, 02/06/2020, 08/05/2020, 04/20/2021   Pfizer Covid-19 Vaccine Bivalent Booster 84yrs & up 09/19/2021   Zoster, Live 11/24/2012     Objective: Vital Signs: BP 119/72 (BP Location: Left Arm, Patient Position: Sitting, Cuff Size: Normal)   Pulse 86   Resp 16   Ht 5' 1.75 (1.568 m)   Wt 135 lb 9.6 oz (61.5 kg)   BMI 25.00 kg/m    Physical Exam Vitals and nursing note reviewed.  Constitutional:      Appearance: She is well-developed.  HENT:     Head: Normocephalic and atraumatic.   Eyes:     Conjunctiva/sclera: Conjunctivae normal.    Cardiovascular:     Rate and Rhythm: Normal rate and regular rhythm.     Heart sounds: Normal heart sounds.  Pulmonary:     Effort: Pulmonary effort is normal.     Breath sounds: Normal breath sounds.  Abdominal:     General: Bowel sounds are normal.     Palpations: Abdomen is soft.   Musculoskeletal:     Cervical back: Normal range of motion.  Lymphadenopathy:     Cervical: No cervical adenopathy.   Skin:    General: Skin is warm and dry.     Capillary Refill: Capillary refill takes less than 2 seconds.   Neurological:     Mental Status: She is alert and oriented to person, place, and time.   Psychiatric:        Behavior: Behavior normal.      Musculoskeletal Exam: C-spine, thoracic spine, lumbar spine have good range of motion.  Discomfort with  range of motion of both shoulder joints.  Elbow joints have good range of motion no tenderness along the joint line.  Slightly limited extension of the right wrist.  MCPs, PIPs, DIPs have good range of motion with no synovitis.  Complete fist formation bilaterally.  Prominence of both CMC joints.  Hip joints have good range of motion with no groin pain.  Some tenderness along the IT band bilaterally.  Knee joints have good range of motion no warmth or effusion.  Ankle joints have good range of motion with no tenderness or joint swelling.  CDAI Exam: CDAI Score: -- Patient Global: --; Provider Global: -- Swollen: --;  Tender: -- Joint Exam 05/27/2024   No joint exam has been documented for this visit   There is currently no information documented on the homunculus. Go to the Rheumatology activity and complete the homunculus joint exam.  Investigation: No additional findings.  Imaging: No results found.   Recent Labs: Lab Results  Component Value Date   WBC 4.2 05/12/2024   HGB 12.5 05/12/2024   PLT 192 05/12/2024   NA 140 05/12/2024   K 4.8 05/12/2024   CL 104 05/12/2024   CO2 28 05/12/2024   GLUCOSE 93 05/12/2024   BUN 14 05/12/2024   CREATININE 0.89 05/12/2024   BILITOT 0.5 05/12/2024   ALKPHOS 53 06/20/2017   AST 29 05/12/2024   ALT 22 05/12/2024   PROT 6.5 05/12/2024   ALBUMIN 3.8 06/20/2017   CALCIUM 9.7 05/12/2024   GFRAA 90 03/16/2021   QFTBGOLDPLUS NEGATIVE 01/06/2024    Speciality Comments: PLQ Eye Exam: 09/05/2023 WNL @ [facility]. Follow up in 6 months. Humira-SEs  Procedures:  No procedures performed Allergies: Dilaudid  [hydromorphone ], Dilaudid  [hydromorphone  hcl], and Hydrocodone    Assessment / Plan:     Visit Diagnoses: Rheumatoid arthritis involving multiple sites with positive rheumatoid factor (HCC) -Patient presents today with increased pain in both shoulders and both hips for the past 2 to 3 weeks.  She has been having to take  extra strength Tylenol  daily or every other day for relief.  She remains on Enbrel  50 mg sq injections once weekly and Plaquenil  200 mg 1 tablet by mouth twice daily Monday through Friday.  According to the patient she has been out of her prescription for methotrexate  for the past 1 month since the prescription was denied by the pharmacy.  She did not contact our office to clarify the refill denial.  Recommend resuming injectable methotrexate  and folic acid  as prescribed.  A refill of methotrexate  was sent to the pharmacy today.  Plan to check sed rate and CRP today.  Discussed the concern for a rheumatoid arthritis flare versus possible PMR.  A prednisone  taper starting at 20 mg tapering by 5 mg every 4 days were sent to the pharmacy today.  Instructions provided. She will notify us  if her symptoms persist or worsen.  She will remain on Enbrel , Plaquenil , and restart methotrexate  as prescribed. Plan: Sedimentation rate, C-reactive protein  High risk medication use - Enbrel  50 mg sq inj weekly, Methotrexate  0.8 ml sq inj wk, folic acid  2 mg daily, Plaquenil  200 mg 1 tablet by mouth BID M-F.  Previous uk:nmjo MTX,Humira. CBC and CMP updated on 05/12/24. Her next lab work will be due in September and every 3 months.   Discussed the importance of holding enbrel  and methotrexate  if she develops signs or symptoms of an infection and to resume once the infection has completely cleared. TB gold negative 01/06/24.   PLQ Eye Exam: 09/05/2023 WNL @ [facility] [place]. Follow up in 6 months.   Chronic pain of both shoulders -Patient presents today with increased pain in both shoulders and both hips for the past 2 to 3 weeks.  No recent injury or fall prior to the onset of symptoms.  Of note she has been out of her prescription of methotrexate  for the past 1 month.  She has had difficulty raising her arms as well as getting up from a chair at times.  She is also difficulty climbing steps.  Discussed the concern  for possible PMR but also discussed it may be  due to an underlying rheumatoid arthritis flare due to In therapy.  Plan to check sed rate and CRP today.  Prednisone  taper starting at 20 mg tapering by 5 mg every 4 days were sent to the pharmacy today.  She will notify us  if her symptoms persist or worsen.  Plan: Sedimentation rate, C-reactive protein  Primary osteoarthritis of both knees: She has good range of motion of both knee joints on examination today.  No warmth or effusion noted.  Spondylosis of lumbar spine: She has occasional discomfort in her lower back.  Chronic right SI joint pain: Not currently symptomatic.  Age-related osteoporosis without current pathological fracture : DEXA 02/05/2023: Right femoral neck BMD 0.598 with T-score -1.3.  Taking a calcium and vitamin D  supplement daily.  Vitamin D  deficiency: She is taking vitamin D  1000 units daily.   Medication monitoring encounter - Previously receiving Prolia  injections through Dr. Vickii office.  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 CXR 2013.  History of hypertension: Blood pressure was 119/72 today in the office.  History of pityriasis rosea    Orders: Orders Placed This Encounter  Procedures   Sedimentation rate   C-reactive protein   Meds ordered this encounter  Medications   predniSONE  (DELTASONE ) 5 MG tablet    Sig: Take 4 tabs po x 4 days, 3  tabs po x 4 days, 2  tabs po x 4 days, 1  tab po x 4 days    Dispense:  40 tablet    Refill:  0   TUBERCULIN SYR 1CC/27GX1/2 (B-D TB SYRINGE 1CC/27GX1/2) 27G X 1/2 1 ML MISC    Sig: Use syringe to dispense methotrexate  subcutaneously once weekly.    Dispense:  12 each    Refill:  3     Follow-Up Instructions: Return in about 5 months (around 10/27/2024) for Rheumatoid arthritis.   Waddell CHRISTELLA Craze, PA-C  Note -  This record has been created using Dragon software.  Chart creation errors have been sought, but may not always  have been located. Such creation errors do not reflect on  the standard of medical care.

## 2024-05-27 ENCOUNTER — Encounter: Payer: Self-pay | Admitting: Physician Assistant

## 2024-05-27 ENCOUNTER — Telehealth: Payer: Self-pay

## 2024-05-27 ENCOUNTER — Other Ambulatory Visit: Payer: Self-pay | Admitting: Physician Assistant

## 2024-05-27 ENCOUNTER — Ambulatory Visit: Attending: Physician Assistant | Admitting: Physician Assistant

## 2024-05-27 VITALS — BP 119/72 | HR 86 | Resp 16 | Ht 61.75 in | Wt 135.6 lb

## 2024-05-27 DIAGNOSIS — R7611 Nonspecific reaction to tuberculin skin test without active tuberculosis: Secondary | ICD-10-CM | POA: Diagnosis not present

## 2024-05-27 DIAGNOSIS — Z79899 Other long term (current) drug therapy: Secondary | ICD-10-CM

## 2024-05-27 DIAGNOSIS — Z853 Personal history of malignant neoplasm of breast: Secondary | ICD-10-CM

## 2024-05-27 DIAGNOSIS — Z8679 Personal history of other diseases of the circulatory system: Secondary | ICD-10-CM

## 2024-05-27 DIAGNOSIS — M533 Sacrococcygeal disorders, not elsewhere classified: Secondary | ICD-10-CM

## 2024-05-27 DIAGNOSIS — Z5181 Encounter for therapeutic drug level monitoring: Secondary | ICD-10-CM

## 2024-05-27 DIAGNOSIS — E559 Vitamin D deficiency, unspecified: Secondary | ICD-10-CM | POA: Diagnosis not present

## 2024-05-27 DIAGNOSIS — G8929 Other chronic pain: Secondary | ICD-10-CM | POA: Diagnosis not present

## 2024-05-27 DIAGNOSIS — M47816 Spondylosis without myelopathy or radiculopathy, lumbar region: Secondary | ICD-10-CM | POA: Diagnosis not present

## 2024-05-27 DIAGNOSIS — M25511 Pain in right shoulder: Secondary | ICD-10-CM

## 2024-05-27 DIAGNOSIS — M0579 Rheumatoid arthritis with rheumatoid factor of multiple sites without organ or systems involvement: Secondary | ICD-10-CM | POA: Diagnosis not present

## 2024-05-27 DIAGNOSIS — M25512 Pain in left shoulder: Secondary | ICD-10-CM

## 2024-05-27 DIAGNOSIS — Z872 Personal history of diseases of the skin and subcutaneous tissue: Secondary | ICD-10-CM

## 2024-05-27 DIAGNOSIS — M170 Bilateral primary osteoarthritis of knee: Secondary | ICD-10-CM | POA: Diagnosis not present

## 2024-05-27 DIAGNOSIS — M810 Age-related osteoporosis without current pathological fracture: Secondary | ICD-10-CM | POA: Diagnosis not present

## 2024-05-27 MED ORDER — PREDNISONE 5 MG PO TABS: ORAL_TABLET | ORAL | 0 refills | Status: AC

## 2024-05-27 MED ORDER — METHOTREXATE SODIUM CHEMO INJECTION 250 MG/10ML: INTRAMUSCULAR | 0 refills | Status: DC

## 2024-05-27 MED ORDER — BD TB SYRINGE 27G X 1/2" 1 ML MISC: 3 refills | Status: DC

## 2024-05-27 NOTE — Telephone Encounter (Signed)
 Refill has been sent.

## 2024-05-27 NOTE — Telephone Encounter (Signed)
 Patient went to get medication refills at [facility] [address] - was able to pick up Prednisone . She states she should have also had a refill of Methotrexate  injection.  Advised her that I will send refill request to provider and someone will call her if needed. Otherwise she will need to check back with [facility] to check on this refill.

## 2024-05-28 ENCOUNTER — Other Ambulatory Visit: Payer: Self-pay

## 2024-05-28 ENCOUNTER — Encounter (INDEPENDENT_AMBULATORY_CARE_PROVIDER_SITE_OTHER): Payer: Self-pay

## 2024-05-28 ENCOUNTER — Ambulatory Visit: Payer: Self-pay | Admitting: Physician Assistant

## 2024-05-28 LAB — SEDIMENTATION RATE: Sed Rate: 9 mm/h (ref 0–30)

## 2024-05-28 LAB — C-REACTIVE PROTEIN: CRP: <3 mg/L (ref <8.0)

## 2024-05-28 NOTE — Progress Notes (Signed)
CRP and ESR WNL

## 2024-05-28 NOTE — Progress Notes (Signed)
 Specialty Pharmacy Refill Coordination Note  Melissa Perez is a 78 y.o. female contacted today regarding refills of specialty medication(s) Etanercept  (Enbrel  Mini)   Patient requested Delivery   Delivery date: 06/02/24   Verified address: [address] [place], [place] [address]   Medication will be filled on 06/01/24.

## 2024-06-11 ENCOUNTER — Telehealth: Payer: Self-pay

## 2024-06-11 NOTE — Telephone Encounter (Signed)
 Recommend evaluation by orthopedics

## 2024-06-11 NOTE — Telephone Encounter (Signed)
 Patient called the office stating that her left leg and butt cheek are still hurting really bad. Patient states that she has tried an ointment for the pain but it is not helping, she has not had any injuries. Her right leg and butt cheek has gotten better. She reports no injuries and that she had to cut her daily routine walk short because of the pain. It last all day and any movement makes it worse. Patient will be taking her last dose of prednisone  tomorrow. Please advise.

## 2024-06-11 NOTE — Telephone Encounter (Signed)
 Contacted patient to Recommend evaluation by orthopedics. Patient verbalized understanding

## 2024-06-12 DIAGNOSIS — M5442 Lumbago with sciatica, left side: Secondary | ICD-10-CM | POA: Diagnosis not present

## 2024-06-12 DIAGNOSIS — M545 Low back pain, unspecified: Secondary | ICD-10-CM | POA: Diagnosis not present

## 2024-06-12 DIAGNOSIS — M25552 Pain in left hip: Secondary | ICD-10-CM | POA: Diagnosis not present

## 2024-06-18 DIAGNOSIS — M6281 Muscle weakness (generalized): Secondary | ICD-10-CM | POA: Diagnosis not present

## 2024-06-18 DIAGNOSIS — M7062 Trochanteric bursitis, left hip: Secondary | ICD-10-CM | POA: Diagnosis not present

## 2024-06-19 ENCOUNTER — Encounter (INDEPENDENT_AMBULATORY_CARE_PROVIDER_SITE_OTHER): Payer: Self-pay

## 2024-06-19 ENCOUNTER — Other Ambulatory Visit: Payer: Self-pay

## 2024-06-19 ENCOUNTER — Other Ambulatory Visit (HOSPITAL_COMMUNITY): Payer: Self-pay

## 2024-06-19 ENCOUNTER — Other Ambulatory Visit: Payer: Self-pay | Admitting: Physician Assistant

## 2024-06-19 DIAGNOSIS — Z79899 Other long term (current) drug therapy: Secondary | ICD-10-CM

## 2024-06-19 DIAGNOSIS — M0579 Rheumatoid arthritis with rheumatoid factor of multiple sites without organ or systems involvement: Secondary | ICD-10-CM

## 2024-06-19 MED ORDER — ENBREL MINI 50 MG/ML ~~LOC~~ SOCT
50.0000 mg | SUBCUTANEOUS | 0 refills | Status: DC
  Filled 2024-06-19: qty 12, 84d supply, fill #0
  Filled 2024-06-19: qty 4, 28d supply, fill #0
  Filled 2024-07-13: qty 4, 28d supply, fill #1
  Filled 2024-08-18 – 2024-08-20 (×2): qty 4, 28d supply, fill #2

## 2024-06-19 NOTE — Progress Notes (Signed)
 Specialty Pharmacy Refill Coordination Note  MyChart Questionnaire Submission  Melissa Perez is a 78 y.o. female contacted today regarding refills of specialty medication(s) Enbrel .  Doses on hand: (Patient-Rptd) 2 1 for 7/22 & 7/29  Injection date: 07/07/24  Patient requested: (Patient-Rptd) Delivery   Delivery date: 06/23/24   Verified address: (Patient-Rptd) [address], , [place] [address]  Medication will be filled on 06/22/24.

## 2024-06-19 NOTE — Telephone Encounter (Signed)
 Last Fill: 03/26/2024  Labs: 05/12/2024 WNL  TB Gold: 01/06/2024 Neg    Next Visit: 10/21/2024  Last Visit: 05/27/2024  DX: Rheumatoid arthritis involving multiple sites with positive rheumatoid factor   Current Dose per office note 05/27/2024: Enbrel  50 mg sq inj weekly   Okay to refill Enbrel ?

## 2024-06-21 ENCOUNTER — Other Ambulatory Visit: Payer: Self-pay | Admitting: Physician Assistant

## 2024-06-21 DIAGNOSIS — M0579 Rheumatoid arthritis with rheumatoid factor of multiple sites without organ or systems involvement: Secondary | ICD-10-CM

## 2024-06-21 DIAGNOSIS — Z79899 Other long term (current) drug therapy: Secondary | ICD-10-CM

## 2024-06-22 ENCOUNTER — Other Ambulatory Visit: Payer: Self-pay

## 2024-06-22 NOTE — Progress Notes (Signed)
 Clinical Intervention Note  Clinical Intervention Notes: The patient reported a new diagnosis of left trochanteric bursitis. Although Enbrel  is not specifically used to treat this condition, the development of bursitis does not interfere with ongoing Enbrel  therapy   Clinical Intervention Outcomes: Prevention of an adverse drug event   Silvano LOISE Dolly Specialty Pharmacist

## 2024-06-23 DIAGNOSIS — M6281 Muscle weakness (generalized): Secondary | ICD-10-CM | POA: Diagnosis not present

## 2024-06-23 DIAGNOSIS — M7062 Trochanteric bursitis, left hip: Secondary | ICD-10-CM | POA: Diagnosis not present

## 2024-06-25 DIAGNOSIS — M6281 Muscle weakness (generalized): Secondary | ICD-10-CM | POA: Diagnosis not present

## 2024-06-25 DIAGNOSIS — M7062 Trochanteric bursitis, left hip: Secondary | ICD-10-CM | POA: Diagnosis not present

## 2024-06-30 ENCOUNTER — Other Ambulatory Visit: Payer: Self-pay | Admitting: Physician Assistant

## 2024-06-30 DIAGNOSIS — M7062 Trochanteric bursitis, left hip: Secondary | ICD-10-CM | POA: Diagnosis not present

## 2024-06-30 DIAGNOSIS — M0579 Rheumatoid arthritis with rheumatoid factor of multiple sites without organ or systems involvement: Secondary | ICD-10-CM

## 2024-06-30 DIAGNOSIS — M6281 Muscle weakness (generalized): Secondary | ICD-10-CM | POA: Diagnosis not present

## 2024-06-30 NOTE — Telephone Encounter (Signed)
 Last Fill: 03/30/2024  Eye exam: 04/02/2024 WNL   Labs: 05/12/2024 CBC and CMP WNL  Next Visit: 10/21/2024  Last Visit: 05/27/2024  DX: Rheumatoid arthritis involving multiple sites with positive rheumatoid factor   Current Dose per office note 05/27/2024: Plaquenil  200 mg 1 tablet by mouth BID M-F   Okay to refill Plaquenil ?

## 2024-07-02 DIAGNOSIS — M6281 Muscle weakness (generalized): Secondary | ICD-10-CM | POA: Diagnosis not present

## 2024-07-02 DIAGNOSIS — M7062 Trochanteric bursitis, left hip: Secondary | ICD-10-CM | POA: Diagnosis not present

## 2024-07-07 ENCOUNTER — Other Ambulatory Visit: Payer: Self-pay

## 2024-07-07 DIAGNOSIS — M6281 Muscle weakness (generalized): Secondary | ICD-10-CM | POA: Diagnosis not present

## 2024-07-07 DIAGNOSIS — M7062 Trochanteric bursitis, left hip: Secondary | ICD-10-CM | POA: Diagnosis not present

## 2024-07-09 DIAGNOSIS — M6281 Muscle weakness (generalized): Secondary | ICD-10-CM | POA: Diagnosis not present

## 2024-07-09 DIAGNOSIS — M7062 Trochanteric bursitis, left hip: Secondary | ICD-10-CM | POA: Diagnosis not present

## 2024-07-13 ENCOUNTER — Encounter (INDEPENDENT_AMBULATORY_CARE_PROVIDER_SITE_OTHER): Payer: Self-pay

## 2024-07-13 ENCOUNTER — Other Ambulatory Visit: Payer: Self-pay

## 2024-07-13 ENCOUNTER — Other Ambulatory Visit (HOSPITAL_COMMUNITY): Payer: Self-pay

## 2024-07-13 NOTE — Progress Notes (Signed)
 Specialty Pharmacy Refill Coordination Note  MyChart Questionnaire Submission  Melissa Perez is a 79 y.o. female contacted today regarding refills of specialty medication(s) Enbrel .  Doses on hand: (Patient-Rptd) 3   Injection date: (Patient-Rptd) 07/14/24  Patient requested: (Patient-Rptd) Delivery   Delivery date: 07/15/24   Verified address: [address] [place] [address]  Medication will be filled on 07/14/24.

## 2024-07-14 ENCOUNTER — Other Ambulatory Visit: Payer: Self-pay

## 2024-07-14 DIAGNOSIS — M6281 Muscle weakness (generalized): Secondary | ICD-10-CM | POA: Diagnosis not present

## 2024-07-14 DIAGNOSIS — M7062 Trochanteric bursitis, left hip: Secondary | ICD-10-CM | POA: Diagnosis not present

## 2024-07-20 DIAGNOSIS — M7062 Trochanteric bursitis, left hip: Secondary | ICD-10-CM | POA: Diagnosis not present

## 2024-07-20 DIAGNOSIS — M6281 Muscle weakness (generalized): Secondary | ICD-10-CM | POA: Diagnosis not present

## 2024-07-24 DIAGNOSIS — M6281 Muscle weakness (generalized): Secondary | ICD-10-CM | POA: Diagnosis not present

## 2024-07-24 DIAGNOSIS — M7062 Trochanteric bursitis, left hip: Secondary | ICD-10-CM | POA: Diagnosis not present

## 2024-07-25 ENCOUNTER — Other Ambulatory Visit: Payer: Self-pay | Admitting: Physician Assistant

## 2024-07-25 DIAGNOSIS — M0579 Rheumatoid arthritis with rheumatoid factor of multiple sites without organ or systems involvement: Secondary | ICD-10-CM

## 2024-07-25 DIAGNOSIS — Z79899 Other long term (current) drug therapy: Secondary | ICD-10-CM

## 2024-07-28 DIAGNOSIS — M6281 Muscle weakness (generalized): Secondary | ICD-10-CM | POA: Diagnosis not present

## 2024-07-28 DIAGNOSIS — M7062 Trochanteric bursitis, left hip: Secondary | ICD-10-CM | POA: Diagnosis not present

## 2024-07-30 ENCOUNTER — Other Ambulatory Visit: Payer: Self-pay | Admitting: Physician Assistant

## 2024-07-30 DIAGNOSIS — M0579 Rheumatoid arthritis with rheumatoid factor of multiple sites without organ or systems involvement: Secondary | ICD-10-CM

## 2024-07-30 DIAGNOSIS — Z79899 Other long term (current) drug therapy: Secondary | ICD-10-CM

## 2024-07-30 DIAGNOSIS — M7062 Trochanteric bursitis, left hip: Secondary | ICD-10-CM | POA: Diagnosis not present

## 2024-07-30 DIAGNOSIS — M6281 Muscle weakness (generalized): Secondary | ICD-10-CM | POA: Diagnosis not present

## 2024-07-30 NOTE — Telephone Encounter (Signed)
 Patient called the office while she was at the pharmacy. It is too soon for a refill as a 90 day supply was sent to the pharmacy on 05/27/2024. The patient passed her phone to the pharmacy and staff and upon talking to her, she has a refill of methotrexate  on file for the patient. Refusing this refill.

## 2024-08-04 ENCOUNTER — Other Ambulatory Visit: Payer: Self-pay

## 2024-08-04 DIAGNOSIS — M6281 Muscle weakness (generalized): Secondary | ICD-10-CM | POA: Diagnosis not present

## 2024-08-04 DIAGNOSIS — M7062 Trochanteric bursitis, left hip: Secondary | ICD-10-CM | POA: Diagnosis not present

## 2024-08-06 DIAGNOSIS — M6281 Muscle weakness (generalized): Secondary | ICD-10-CM | POA: Diagnosis not present

## 2024-08-06 DIAGNOSIS — M7062 Trochanteric bursitis, left hip: Secondary | ICD-10-CM | POA: Diagnosis not present

## 2024-08-10 DIAGNOSIS — R2689 Other abnormalities of gait and mobility: Secondary | ICD-10-CM | POA: Diagnosis not present

## 2024-08-10 DIAGNOSIS — M069 Rheumatoid arthritis, unspecified: Secondary | ICD-10-CM | POA: Diagnosis not present

## 2024-08-10 DIAGNOSIS — E782 Mixed hyperlipidemia: Secondary | ICD-10-CM | POA: Diagnosis not present

## 2024-08-10 DIAGNOSIS — R7309 Other abnormal glucose: Secondary | ICD-10-CM | POA: Diagnosis not present

## 2024-08-10 DIAGNOSIS — M064 Inflammatory polyarthropathy: Secondary | ICD-10-CM | POA: Diagnosis not present

## 2024-08-10 DIAGNOSIS — Z23 Encounter for immunization: Secondary | ICD-10-CM | POA: Diagnosis not present

## 2024-08-10 DIAGNOSIS — I10 Essential (primary) hypertension: Secondary | ICD-10-CM | POA: Diagnosis not present

## 2024-08-11 DIAGNOSIS — M7062 Trochanteric bursitis, left hip: Secondary | ICD-10-CM | POA: Diagnosis not present

## 2024-08-11 DIAGNOSIS — M6281 Muscle weakness (generalized): Secondary | ICD-10-CM | POA: Diagnosis not present

## 2024-08-12 ENCOUNTER — Other Ambulatory Visit: Payer: Self-pay | Admitting: *Deleted

## 2024-08-12 DIAGNOSIS — Z79899 Other long term (current) drug therapy: Secondary | ICD-10-CM | POA: Diagnosis not present

## 2024-08-12 LAB — CBC WITH DIFFERENTIAL/PLATELET
Absolute Lymphocytes: 1808 {cells}/uL (ref 850–3900)
Absolute Monocytes: 421 {cells}/uL (ref 200–950)
Basophils Absolute: 69 {cells}/uL (ref 0–200)
Basophils Relative: 1.4 %
Eosinophils Absolute: 123 {cells}/uL (ref 15–500)
Eosinophils Relative: 2.5 %
HCT: 37.8 % (ref 35.0–45.0)
Hemoglobin: 12.5 g/dL (ref 11.7–15.5)
MCH: 32.9 pg (ref 27.0–33.0)
MCHC: 33.1 g/dL (ref 32.0–36.0)
MCV: 99.5 fL (ref 80.0–100.0)
MPV: 9.8 fL (ref 7.5–12.5)
Monocytes Relative: 8.6 %
Neutro Abs: 2479 {cells}/uL (ref 1500–7800)
Neutrophils Relative %: 50.6 %
Platelets: 204 Thousand/uL (ref 140–400)
RBC: 3.8 Million/uL (ref 3.80–5.10)
RDW: 13.6 % (ref 11.0–15.0)
Total Lymphocyte: 36.9 %
WBC: 4.9 Thousand/uL (ref 3.8–10.8)

## 2024-08-12 LAB — COMPREHENSIVE METABOLIC PANEL WITH GFR
AG Ratio: 2 (calc) (ref 1.0–2.5)
ALT: 22 U/L (ref 6–29)
AST: 29 U/L (ref 10–35)
Albumin: 4.7 g/dL (ref 3.6–5.1)
Alkaline phosphatase (APISO): 83 U/L (ref 37–153)
BUN: 19 mg/dL (ref 7–25)
CO2: 26 mmol/L (ref 20–32)
Calcium: 10.1 mg/dL (ref 8.6–10.4)
Chloride: 97 mmol/L — ABNORMAL LOW (ref 98–110)
Creat: 0.98 mg/dL (ref 0.60–1.00)
Globulin: 2.4 g/dL (ref 1.9–3.7)
Glucose, Bld: 83 mg/dL (ref 65–99)
Potassium: 3.9 mmol/L (ref 3.5–5.3)
Sodium: 136 mmol/L (ref 135–146)
Total Bilirubin: 0.6 mg/dL (ref 0.2–1.2)
Total Protein: 7.1 g/dL (ref 6.1–8.1)
eGFR: 60 mL/min/1.73m2 (ref >=60)

## 2024-08-13 ENCOUNTER — Ambulatory Visit: Payer: Self-pay | Admitting: Rheumatology

## 2024-08-13 DIAGNOSIS — M7062 Trochanteric bursitis, left hip: Secondary | ICD-10-CM | POA: Diagnosis not present

## 2024-08-13 DIAGNOSIS — M6281 Muscle weakness (generalized): Secondary | ICD-10-CM | POA: Diagnosis not present

## 2024-08-13 NOTE — Progress Notes (Signed)
 CBC and CMP are normal.

## 2024-08-14 ENCOUNTER — Other Ambulatory Visit (HOSPITAL_COMMUNITY): Payer: Self-pay

## 2024-08-18 ENCOUNTER — Other Ambulatory Visit (HOSPITAL_COMMUNITY): Payer: Self-pay

## 2024-08-18 ENCOUNTER — Ambulatory Visit: Payer: Medicare PPO | Admitting: Dermatology

## 2024-08-18 ENCOUNTER — Encounter: Payer: Self-pay | Admitting: Dermatology

## 2024-08-18 VITALS — BP 114/69 | HR 79

## 2024-08-18 DIAGNOSIS — L814 Other melanin hyperpigmentation: Secondary | ICD-10-CM

## 2024-08-18 DIAGNOSIS — L821 Other seborrheic keratosis: Secondary | ICD-10-CM | POA: Diagnosis not present

## 2024-08-18 DIAGNOSIS — D229 Melanocytic nevi, unspecified: Secondary | ICD-10-CM

## 2024-08-18 DIAGNOSIS — W908XXA Exposure to other nonionizing radiation, initial encounter: Secondary | ICD-10-CM

## 2024-08-18 DIAGNOSIS — L905 Scar conditions and fibrosis of skin: Secondary | ICD-10-CM | POA: Diagnosis not present

## 2024-08-18 DIAGNOSIS — D485 Neoplasm of uncertain behavior of skin: Secondary | ICD-10-CM

## 2024-08-18 DIAGNOSIS — D225 Melanocytic nevi of trunk: Secondary | ICD-10-CM

## 2024-08-18 DIAGNOSIS — L578 Other skin changes due to chronic exposure to nonionizing radiation: Secondary | ICD-10-CM

## 2024-08-18 DIAGNOSIS — Z1211 Encounter for screening for malignant neoplasm of colon: Secondary | ICD-10-CM | POA: Insufficient documentation

## 2024-08-18 DIAGNOSIS — D1801 Hemangioma of skin and subcutaneous tissue: Secondary | ICD-10-CM

## 2024-08-18 DIAGNOSIS — L811 Chloasma: Secondary | ICD-10-CM | POA: Diagnosis not present

## 2024-08-18 DIAGNOSIS — Z1283 Encounter for screening for malignant neoplasm of skin: Secondary | ICD-10-CM | POA: Diagnosis not present

## 2024-08-18 NOTE — Patient Instructions (Addendum)

## 2024-08-18 NOTE — Progress Notes (Signed)
 New Patient Visit   Subjective  Melissa Perez is a 78 y.o. female who presents for the following: Total Body Skin Exam (TBSE)  Patient present today for new patient visit for TBSE due to high risk medication for RA (Methotrexate  injections 01/2009, Enbrel  06/2024).The patient denies she has spots, moles and lesions to be evaluated, some may be new or changing and the patient may have concern these could be cancer. Patient has previously been treated by a dermatologist.Patient reports she does not have hx of bx. Patient denies family history of skin cancers. Patient reports throughout her lifetime has had moderate sun exposure. Currently, patient reports if she has excessive sun exposure, she does apply sunscreen and/or wears protective coverings.  The following portions of the chart were reviewed this encounter and updated as appropriate: medications, allergies, medical history  Review of Systems:  No other skin or systemic complaints except as noted in HPI or Assessment and Plan.  Objective  Well appearing patient in no apparent distress; mood and affect are within normal limits.  A full examination was performed including scalp, head, eyes, ears, nose, lips, neck, chest, axillae, abdomen, back, buttocks, bilateral upper extremities, bilateral lower extremities, hands, feet, fingers, toes, fingernails, and toenails. All findings within normal limits unless otherwise noted below.   Relevant exam findings are noted in the Assessment and Plan.       Left Buttock 4 mm Irregular Dark Brown Macule  Assessment & Plan   LENTIGINES, SEBORRHEIC KERATOSES, HEMANGIOMAS - Benign normal skin lesions - Benign-appearing - Call for any changes  MELANOCYTIC NEVI - Tan-brown and/or pink-flesh-colored symmetric macules and papules - Benign appearing on exam today - Observation - Call clinic for new or changing moles - Recommend daily use of broad spectrum spf 30+ sunscreen to sun-exposed areas.    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  - Staying in the shade or wearing long sleeves, sun glasses (UVA+UVB protection) and wide brim hats (4-inch brim around the entire circumference of the hat) are also recommended for sun protection.  - Call for new or changing lesions.  MELASMA Exam: reticulated hyperpigmented patches at face  Flared  Melasma is a chronic; persistent condition of hyperpigmented patches generally on the face, worse in summer due to higher UV exposure.    Heredity; thyroid disease; sun exposure; pregnancy; birth control pills; epilepsy medication and darker skin may predispose to Melasma.   R Treatment Plan: - Recommend daily broad spectrum (UVA/UVB) tinted mineral sunscreen SPF 30+, with Zinc or Titanium Dioxide.  S/P Lumpectomy Right Breast Exam: Well healed scar from previous Lumpectomy  Treatment Plan: - Continue to monitor sites - Recommended yearly Mammograms  SKIN CANCER SCREENING PERFORMED TODAY  NEOPLASM OF UNCERTAIN BEHAVIOR OF SKIN Left Buttock Epidermal / dermal shaving  Lesion diameter (cm):  0.4 Informed consent: discussed and consent obtained   Timeout: patient name, date of birth, surgical site, and procedure verified   Procedure prep:  Patient was prepped and draped in usual sterile fashion Prep type:  Isopropyl alcohol Anesthesia: the lesion was anesthetized in a standard fashion   Anesthetic:  1% lidocaine  w/ epinephrine 1-100,000 buffered w/ 8.4% NaHCO3 Instrument used: DermaBlade   Hemostasis achieved with: aluminum chloride   Outcome: patient tolerated procedure well   Post-procedure details: sterile dressing applied and wound  care instructions given   Dressing type: petrolatum   Additional details:  Pt aware  that benign results will be sent to mychart and the staff will call abnormal results will  Specimen 1 - Surgical pathology Differential Diagnosis: R/O DN  Check Margins: No  Return in about 1 year (around 08/18/2025) for TBSE.  I, Jetta Ager, am acting as Neurosurgeon for [employer], DO.  Documentation: I have reviewed the above documentation for accuracy and completeness, and I agree with the above.  Delon Lenis, DO

## 2024-08-19 LAB — SURGICAL PATHOLOGY

## 2024-08-20 ENCOUNTER — Ambulatory Visit: Payer: Self-pay | Admitting: Dermatology

## 2024-08-20 ENCOUNTER — Other Ambulatory Visit: Payer: Self-pay

## 2024-08-20 NOTE — Progress Notes (Signed)
 Specialty Pharmacy Refill Coordination Note  Melissa Perez is a 78 y.o. female contacted today regarding refills of specialty medication(s) Etanercept  (Enbrel  Mini)   Patient requested Delivery   Delivery date: 08/27/24   Verified address: [address]  [name] [address]   Medication will be filled on 08/26/24.

## 2024-08-25 DIAGNOSIS — R7309 Other abnormal glucose: Secondary | ICD-10-CM | POA: Diagnosis not present

## 2024-08-25 DIAGNOSIS — E782 Mixed hyperlipidemia: Secondary | ICD-10-CM | POA: Diagnosis not present

## 2024-08-25 DIAGNOSIS — I10 Essential (primary) hypertension: Secondary | ICD-10-CM | POA: Diagnosis not present

## 2024-08-25 DIAGNOSIS — M064 Inflammatory polyarthropathy: Secondary | ICD-10-CM | POA: Diagnosis not present

## 2024-08-25 DIAGNOSIS — R7303 Prediabetes: Secondary | ICD-10-CM | POA: Diagnosis not present

## 2024-08-26 ENCOUNTER — Other Ambulatory Visit: Payer: Self-pay

## 2024-09-02 ENCOUNTER — Ambulatory Visit: Admitting: Physician Assistant

## 2024-09-16 ENCOUNTER — Other Ambulatory Visit (HOSPITAL_COMMUNITY): Payer: Self-pay

## 2024-09-16 ENCOUNTER — Encounter (INDEPENDENT_AMBULATORY_CARE_PROVIDER_SITE_OTHER): Payer: Self-pay

## 2024-09-16 ENCOUNTER — Other Ambulatory Visit: Payer: Self-pay | Admitting: Physician Assistant

## 2024-09-16 ENCOUNTER — Other Ambulatory Visit: Payer: Self-pay

## 2024-09-16 DIAGNOSIS — M0579 Rheumatoid arthritis with rheumatoid factor of multiple sites without organ or systems involvement: Secondary | ICD-10-CM

## 2024-09-16 DIAGNOSIS — Z79899 Other long term (current) drug therapy: Secondary | ICD-10-CM

## 2024-09-16 MED ORDER — ENBREL MINI 50 MG/ML ~~LOC~~ SOCT
50.0000 mg | SUBCUTANEOUS | 0 refills | Status: DC
  Filled 2024-09-16: qty 4, 28d supply, fill #0
  Filled 2024-10-12: qty 4, 28d supply, fill #1
  Filled 2024-11-10 – 2024-11-11 (×2): qty 4, 28d supply, fill #2

## 2024-09-16 NOTE — Progress Notes (Signed)
 Specialty Pharmacy Refill Coordination Note  DUSTIN BURRILL is a 78 y.o. female contacted today regarding refills of specialty medication(s) Etanercept  (Enbrel  Mini)   Patient requested Delivery   Delivery date: 09/18/24   Verified address: [address] [place], [place] [address]   Medication will be filled on 09/17/24.

## 2024-09-16 NOTE — Telephone Encounter (Signed)
 Last Fill: 06/19/2024  Labs: 08/12/2024 CBC and CMP are normal.   TB Gold: 01/06/2024 Neg    Next Visit: 10/21/2024  Last Visit: 05/27/2024  IK:Myzlfjunpi arthritis involving multiple sites with positive rheumatoid factor   Current Dose per office note 05/27/2024: Enbrel  50 mg sq inj weekly   Okay to refill Enbrel ?

## 2024-09-21 ENCOUNTER — Other Ambulatory Visit: Payer: Self-pay

## 2024-09-28 ENCOUNTER — Other Ambulatory Visit: Payer: Self-pay | Admitting: Physician Assistant

## 2024-09-28 DIAGNOSIS — M0579 Rheumatoid arthritis with rheumatoid factor of multiple sites without organ or systems involvement: Secondary | ICD-10-CM

## 2024-09-28 NOTE — Telephone Encounter (Signed)
 Last Fill: 06/30/2024  Eye exam: 04/02/2024    Labs: 08/12/2024 CBC and CMP are normal.   Next Visit: 10/21/2024  Last Visit: 05/27/2024  DX: Rheumatoid arthritis involving multiple sites with positive rheumatoid factor   Current Dose per office note on 05/27/2024: Plaquenil  200 mg 1 tablet by mouth BID M-F.   Okay to refill Plaquenil ?

## 2024-10-01 DIAGNOSIS — M0570 Rheumatoid arthritis with rheumatoid factor of unspecified site without organ or systems involvement: Secondary | ICD-10-CM | POA: Diagnosis not present

## 2024-10-01 DIAGNOSIS — Z79899 Other long term (current) drug therapy: Secondary | ICD-10-CM | POA: Diagnosis not present

## 2024-10-01 DIAGNOSIS — H2513 Age-related nuclear cataract, bilateral: Secondary | ICD-10-CM | POA: Diagnosis not present

## 2024-10-01 DIAGNOSIS — H43813 Vitreous degeneration, bilateral: Secondary | ICD-10-CM | POA: Diagnosis not present

## 2024-10-01 DIAGNOSIS — H35371 Puckering of macula, right eye: Secondary | ICD-10-CM | POA: Diagnosis not present

## 2024-10-07 NOTE — Progress Notes (Signed)
 Office Visit Note  Patient: Melissa Perez             Date of Birth: Mar 09, 1946           MRN: [id number]             PCP: Benjamine Aland, MD Referring: Benjamine Aland, MD Visit Date: 10/21/2024 Occupation: Data Unavailable  Subjective:  Medication monitoring    History of Present Illness: Melissa Perez is a 78 y.o. female with history of rheumatoid arthritis.  Patient remains on Enbrel  50 mg sq inj weekly, Methotrexate  0.8 ml sq inj wk, folic acid  2 mg daily, Plaquenil  200 mg 1 tablet by mouth BID M-F.  She is tolerating combination therapy without any side effects and has not had any recent gaps in therapy.  She denies any joint swelling at this time.  She has been experiencing tenderness and locking of the left thumb on a daily basis.  The left trigger thumb has been occurring more frequently so she would like to discuss treatment options.  She has occasional locking of the left index but this has been inconsistent. Patient states that since her last office visit she completed 6 weeks of physical therapy for trochanter bursitis and IT band syndrome.  Her symptoms have been more manageable.  She has continued home exercises. Patient states that she was recently evaluated by dermatology for skin cancer screening.  Patient states that she had a biopsy which was unremarkable. She denies any recent or recurrent infections.   Activities of Daily Living:  Patient reports morning stiffness for a few minutes.   Patient Denies nocturnal pain.  Difficulty dressing/grooming: Denies Difficulty climbing stairs: Denies Difficulty getting out of chair: Denies Difficulty using hands for taps, buttons, cutlery, and/or writing: Reports  Review of Systems  Constitutional:  Positive for fatigue.  HENT:  Negative for mouth sores and mouth dryness.   Eyes:  Negative for dryness.  Respiratory:  Negative for shortness of breath.   Cardiovascular:  Negative for chest pain and palpitations.  Gastrointestinal:   Negative for blood in stool, constipation and diarrhea.  Endocrine: Negative for increased urination.  Genitourinary:  Negative for involuntary urination.  Musculoskeletal:  Positive for joint pain, joint pain, joint swelling and morning stiffness. Negative for gait problem, myalgias, muscle weakness, muscle tenderness and myalgias.  Skin:  Positive for color change and sensitivity to sunlight. Negative for rash and hair loss.  Allergic/Immunologic: Positive for susceptible to infections.  Neurological:  Negative for dizziness and headaches.  Hematological:  Negative for swollen glands.  Psychiatric/Behavioral:  Positive for sleep disturbance. Negative for depressed mood. The patient is not nervous/anxious.     PMFS History:  Patient Active Problem List   Diagnosis Date Noted   Encounter for screening for malignant neoplasm of colon 08/18/2024   Fracture, Colles, right, closed 11/16/2020   Pruritus of scalp 06/20/2018   DDD (degenerative disc disease), lumbar 05/27/2018   Essential hypertension 03/19/2017   History of pityriasis rosea 03/19/2017   Chronic right SI joint pain 03/19/2017   History of malignant neoplasm of breast 03/19/2017   Eczema 03/05/2017   Vulvar dermatitis 03/05/2017   Rheumatoid arthritis  10/15/2016   High risk medication use 10/15/2016   Morton's metatarsalgia 10/15/2016   Positive PPD, 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   S/P lumpectomy of breast 12/03/2011   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HCC) 11/01/2011    Past Medical History:  Diagnosis Date   Arthritis  rheumatoid, take Humira   Arthritis    rheumatoid   Arthritis pain    Back pain    Breast cancer (HCC)    Cancer (HCC)    History of dental surgery    Hyperlipidemia    Hypertension    Personal history of radiation therapy    Rash    Rheumatoid arthritis (HCC)    S/P lumpectomy of breast 12/03/2011   Trochanteric bursitis     Family History  Problem Relation Age of  Onset   Breast cancer Mother    Cancer Mother    Kidney cancer Father    Cancer Father    Breast cancer Sister    Cancer Sister    Lung cancer Sister    Colon cancer Maternal Uncle    Liver cancer Paternal Aunt    Past Surgical History:  Procedure Laterality Date   ABDOMINAL HYSTERECTOMY     APPENDECTOMY     BREAST EXCISIONAL BIOPSY Left 1991   benign   BREAST LUMPECTOMY Right 11/2011   BREAST SURGERY     2 left breast biopsies-benign   left breast biopsies  1991 & 1990   Social History   Tobacco Use   Smoking status: Former    Current packs/day: 0.00    Types: Cigarettes    Quit date: 11/21/2003    Years since quitting: 20.9    Passive exposure: Never   Smokeless tobacco: Never  Vaping Use   Vaping status: Never Used  Substance Use Topics   Alcohol use: Yes    Alcohol/week: 3.0 standard drinks of alcohol    Types: 3 Glasses of wine per week    Comment: occ   Drug use: No   Social History   Social History Narrative   Adult Son in the [employer]     Immunization History  Administered Date(s) Administered   Fluad Quad(high Dose 65+) 08/12/2019   INFLUENZA, HIGH DOSE SEASONAL PF 09/10/2017, 09/14/2020   Influenza-Unspecified 07/03/2018   PFIZER(Purple Top)SARS-COV-2 Vaccination 01/14/2020, 02/06/2020, 08/05/2020, 04/20/2021   Pfizer Covid-19 Vaccine Bivalent Booster 33yrs & up 09/19/2021   Pfizer(Comirnaty)Fall Seasonal Vaccine 12 years and older 09/09/2024   Zoster, Live 11/24/2012     Objective: Vital Signs: BP 106/65   Pulse 96   Temp 97.6 F (36.4 C)   Resp 14   Ht 5' 1.25 (1.556 m)   Wt 135 lb 6.4 oz (61.4 kg)   BMI 25.38 kg/m    Physical Exam Vitals and nursing note reviewed.  Constitutional:      Appearance: She is well-developed.  HENT:     Head: Normocephalic and atraumatic.  Eyes:     Conjunctiva/sclera: Conjunctivae normal.  Cardiovascular:     Rate and Rhythm: Normal rate and regular rhythm.     Heart sounds: Normal heart sounds.   Pulmonary:     Effort: Pulmonary effort is normal.     Breath sounds: Normal breath sounds.  Abdominal:     General: Bowel sounds are normal.     Palpations: Abdomen is soft.  Musculoskeletal:     Cervical back: Normal range of motion.  Lymphadenopathy:     Cervical: No cervical adenopathy.  Skin:    General: Skin is warm and dry.     Capillary Refill: Capillary refill takes less than 2 seconds.  Neurological:     Mental Status: She is alert and oriented to person, place, and time.  Psychiatric:        Behavior: Behavior normal.  Musculoskeletal Exam: C-spine has slightly limited ROM with latera rotation.  Thoracic spine and lumbar spine have good range of motion.  No midline spinal tenderness.  No SI joint tenderness.  Shoulder joints, elbow joints, wrist joints, MCPs, PIPs, DIPs have good range of motion with no synovitis. Left trigger thumb. Complete fist formation bilaterally.  Hip joints have good range of motion with no groin pain.  Knee joints have good range of motion no warmth or effusion.  Ankle joints have good range of motion no tenderness or joint swelling.     CDAI Exam: CDAI Score: -- Patient Global: --; Provider Global: -- Swollen: --; Tender: -- Joint Exam 10/21/2024   No joint exam has been documented for this visit   There is currently no information documented on the homunculus. Go to the Rheumatology activity and complete the homunculus joint exam.  Investigation: No additional findings.  Imaging: No results found.  Recent Labs: Lab Results  Component Value Date   WBC 4.9 08/12/2024   HGB 12.5 08/12/2024   PLT 204 08/12/2024   NA 136 08/12/2024   K 3.9 08/12/2024   CL 97 (L) 08/12/2024   CO2 26 08/12/2024   GLUCOSE 83 08/12/2024   BUN 19 08/12/2024   CREATININE 0.98 08/12/2024   BILITOT 0.6 08/12/2024   ALKPHOS 53 06/20/2017   AST 29 08/12/2024   ALT 22 08/12/2024   PROT 7.1 08/12/2024   ALBUMIN 3.8 06/20/2017   CALCIUM 10.1  08/12/2024   GFRAA 90 03/16/2021   QFTBGOLDPLUS NEGATIVE 01/06/2024    Speciality Comments: PLQ Eye Exam: 10/01/2024 WNL @ [facility] [place]. Follow up in 6 months. Humira-SEs  Procedures:  No procedures performed Allergies: Dilaudid  [hydromorphone ], Dilaudid  [hydromorphone  hcl], and Hydrocodone    Assessment / Plan:     Visit Diagnoses: Rheumatoid arthritis: She has no synovitis on examination today.  She has not had any signs or symptoms of a rheumatoid arthritis flare.  She has clinically been doing well on Enbrel  50 mg sq injections once weekly, Methotrexate  0.8 mL sq injections once weekly, folic acid  2 mg daily, Plaquenil  200 mg 1 tablet by mouth twice daily Monday through Friday.  She is tolerating triple therapy without any side effects and has not had any gaps in therapy.  Discussed the option of reducing the dose of methotrexate  or discontinuing Plaquenil  but she would like to hold off on making any medication changes at this time.  She will notify us  if she develops any signs or symptoms of a flare.  She will follow-up in the office in 5 months or sooner if needed.  High risk medication use - Enbrel  50 mg sq inj weekly, Methotrexate  0.8 ml sq inj wk, folic acid  2 mg daily, Plaquenil  200 mg 1 tablet by mouth BID M-F. Previous uk:nmjo MTX,Humira. CBC and CMP updated 08/12/24.  She is planning on returning for lab work in mid December and every 3 months to monitor for drug toxicity. TB gold negative on 01/06/24.  No recent or recurrent infections.  Discussed the importance of holding enbrel  and methotrexate  if she develops signs or symptoms of an infection and to resume once the infection has completely cleared.  PLQ Eye Exam: 10/01/2024 WNL @ [facility] [place]. Follow up in 6 months.  Patient has established care with dermatology--Dr. Alm for skin cancer screening.  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 CXR 2013.  Trigger thumb, left thumb: Patient presents today with locking and tenderness of the left thumb consistent with a trigger finger.  Different treatment options were discussed today including splinting versus proceeding with an ultrasound-guided cortisone injection.  Her symptoms have been consistent so she would like to proceed with scheduling an ultrasound-guided cortisone injection.  Primary osteoarthritis of both knees: She has good range of motion of both knee joints on examination today.  No warmth or effusion noted.  Spondylosis of lumbar spine: No symptoms of radiculopathy.  Trochanteric bursitis of both hips: Patient completed 6 weeks of physical therapy and has continued home exercises.  Her symptoms have been manageable.  Chronic right SI joint pain: Not currently symptomatic.  Other medical conditions are listed as follows:  Age-related osteoporosis without current pathological fracture: DEXA 02/05/2023: Right femoral neck BMD 0.598 with T-score -1.3.  Taking a calcium and vitamin D  supplement daily.  Vitamin D  deficiency  Medication monitoring encounter - Previously receiving Prolia  injections through Dr. Vickii office.  History of malignant neoplasm of breast  History of hypertension: Blood pressure was 106/65 today in the office.  History of pityriasis rosea  Orders: No orders of the defined types were placed in this encounter.  No orders of the defined types were placed in this encounter.    Follow-Up Instructions: Return in about 5 months (around 03/21/2025) for Rheumatoid arthritis.   Waddell CHRISTELLA Craze, PA-C  Note - This record has been created using Dragon software.  Chart creation errors have been sought, but may not always  have been located. Such creation errors do not reflect on  the standard of medical care.

## 2024-10-12 ENCOUNTER — Other Ambulatory Visit: Payer: Self-pay

## 2024-10-13 ENCOUNTER — Other Ambulatory Visit: Payer: Self-pay

## 2024-10-14 ENCOUNTER — Other Ambulatory Visit (HOSPITAL_COMMUNITY): Payer: Self-pay

## 2024-10-14 ENCOUNTER — Other Ambulatory Visit: Payer: Self-pay

## 2024-10-14 NOTE — Progress Notes (Signed)
 Specialty Pharmacy Refill Coordination Note  Spoke with Melissa Perez is a 78 y.o. female contacted today regarding refills of specialty medication(s) Etanercept  (Enbrel  Mini)  Doses on hand: 1 for 11/18  Injection date: 10/27/24   Patient requested: Delivery   Delivery date: 10/22/24   Verified address: [address] [place] [address]  Medication will be filled on 10/21/24 .

## 2024-10-15 ENCOUNTER — Other Ambulatory Visit: Payer: Self-pay

## 2024-10-15 NOTE — Progress Notes (Signed)
 Clinical Intervention Note  Clinical Intervention Notes: Patient reports starting Nexletol (RF), no DDIs were identified with her Enbrel .   Clinical Intervention Outcomes: Prevention of an adverse drug event   Melissa Perez Blair Karel Santa

## 2024-10-15 NOTE — Progress Notes (Signed)
 Specialty Pharmacy Ongoing Clinical Assessment Note  Melissa Perez is a 78 y.o. female who is being followed by the specialty pharmacy service for RxSp Rheumatoid Arthritis   Patient's specialty medication(s) reviewed today: Etanercept  (Enbrel  Mini)   Missed doses in the last 4 weeks: 0   Patient/Caregiver did not have any additional questions or concerns.   Therapeutic benefit summary: Patient is achieving benefit   Adverse events/side effects summary: No adverse events/side effects   Patient's therapy is appropriate to: Continue    Goals Addressed             This Visit's Progress    Maintain optimal adherence to therapy   On track    Patient is on track. Patient will maintain adherence, adhere to provider and/or lab appointments, and be monitored by provider to determine if a change in treatment plan is warranted         Follow up: 12 months  [facility] Specialty Pharmacist

## 2024-10-21 ENCOUNTER — Encounter: Payer: Self-pay | Admitting: Physician Assistant

## 2024-10-21 ENCOUNTER — Other Ambulatory Visit: Payer: Self-pay

## 2024-10-21 ENCOUNTER — Ambulatory Visit: Attending: Physician Assistant | Admitting: Physician Assistant

## 2024-10-21 VITALS — BP 106/65 | HR 96 | Temp 97.6°F | Resp 14 | Ht 61.25 in | Wt 135.4 lb

## 2024-10-21 DIAGNOSIS — M7062 Trochanteric bursitis, left hip: Secondary | ICD-10-CM

## 2024-10-21 DIAGNOSIS — M170 Bilateral primary osteoarthritis of knee: Secondary | ICD-10-CM

## 2024-10-21 DIAGNOSIS — G8929 Other chronic pain: Secondary | ICD-10-CM

## 2024-10-21 DIAGNOSIS — M0579 Rheumatoid arthritis with rheumatoid factor of multiple sites without organ or systems involvement: Secondary | ICD-10-CM

## 2024-10-21 DIAGNOSIS — Z853 Personal history of malignant neoplasm of breast: Secondary | ICD-10-CM

## 2024-10-21 DIAGNOSIS — M810 Age-related osteoporosis without current pathological fracture: Secondary | ICD-10-CM

## 2024-10-21 DIAGNOSIS — Z5181 Encounter for therapeutic drug level monitoring: Secondary | ICD-10-CM

## 2024-10-21 DIAGNOSIS — M533 Sacrococcygeal disorders, not elsewhere classified: Secondary | ICD-10-CM | POA: Diagnosis not present

## 2024-10-21 DIAGNOSIS — M47816 Spondylosis without myelopathy or radiculopathy, lumbar region: Secondary | ICD-10-CM | POA: Diagnosis not present

## 2024-10-21 DIAGNOSIS — E559 Vitamin D deficiency, unspecified: Secondary | ICD-10-CM

## 2024-10-21 DIAGNOSIS — Z79899 Other long term (current) drug therapy: Secondary | ICD-10-CM

## 2024-10-21 DIAGNOSIS — R7611 Nonspecific reaction to tuberculin skin test without active tuberculosis: Secondary | ICD-10-CM | POA: Diagnosis not present

## 2024-10-21 DIAGNOSIS — M65312 Trigger thumb, left thumb: Secondary | ICD-10-CM

## 2024-10-21 DIAGNOSIS — Z872 Personal history of diseases of the skin and subcutaneous tissue: Secondary | ICD-10-CM

## 2024-10-21 DIAGNOSIS — M7061 Trochanteric bursitis, right hip: Secondary | ICD-10-CM

## 2024-10-21 DIAGNOSIS — Z8679 Personal history of other diseases of the circulatory system: Secondary | ICD-10-CM

## 2024-10-21 MED ORDER — BD TB SYRINGE 27G X 1/2" 1 ML MISC: 3 refills | Status: AC

## 2024-10-21 MED ORDER — METHOTREXATE SODIUM CHEMO INJECTION 250 MG/10ML: INTRAMUSCULAR | 0 refills | Status: DC

## 2024-10-21 NOTE — Progress Notes (Unsigned)
 Patient seen in office today, please review and sign.

## 2024-11-05 ENCOUNTER — Other Ambulatory Visit: Payer: Self-pay

## 2024-11-10 ENCOUNTER — Other Ambulatory Visit: Payer: Self-pay

## 2024-11-10 ENCOUNTER — Other Ambulatory Visit: Payer: Self-pay | Admitting: *Deleted

## 2024-11-10 DIAGNOSIS — Z79899 Other long term (current) drug therapy: Secondary | ICD-10-CM

## 2024-11-11 ENCOUNTER — Other Ambulatory Visit: Payer: Self-pay

## 2024-11-12 ENCOUNTER — Ambulatory Visit: Payer: Self-pay | Admitting: Rheumatology

## 2024-11-12 LAB — COMPREHENSIVE METABOLIC PANEL WITH GFR
AG Ratio: 2.1 (calc) (ref 1.0–2.5)
ALT: 24 U/L (ref 6–29)
AST: 28 U/L (ref 10–35)
Albumin: 4.4 g/dL (ref 3.6–5.1)
Alkaline phosphatase (APISO): 72 U/L (ref 37–153)
BUN: 11 mg/dL (ref 7–25)
CO2: 28 mmol/L (ref 20–32)
Calcium: 9.7 mg/dL (ref 8.6–10.4)
Chloride: 103 mmol/L (ref 98–110)
Creat: 0.92 mg/dL (ref 0.60–1.00)
Globulin: 2.1 g/dL (ref 1.9–3.7)
Glucose, Bld: 88 mg/dL (ref 65–99)
Potassium: 4.6 mmol/L (ref 3.5–5.3)
Sodium: 139 mmol/L (ref 135–146)
Total Bilirubin: 0.4 mg/dL (ref 0.2–1.2)
Total Protein: 6.5 g/dL (ref 6.1–8.1)
eGFR: 65 mL/min/1.73m2 (ref >=60)

## 2024-11-12 LAB — CBC WITH DIFFERENTIAL/PLATELET
Absolute Lymphocytes: 1786 {cells}/uL (ref 850–3900)
Absolute Monocytes: 462 {cells}/uL (ref 200–950)
Basophils Absolute: 62 {cells}/uL (ref 0–200)
Basophils Relative: 1.4 %
Eosinophils Absolute: 211 {cells}/uL (ref 15–500)
Eosinophils Relative: 4.8 %
HCT: 34.2 % — ABNORMAL LOW (ref 35.9–46.0)
Hemoglobin: 11.3 g/dL — ABNORMAL LOW (ref 11.7–15.5)
MCH: 33.1 pg — ABNORMAL HIGH (ref 27.0–33.0)
MCHC: 33 g/dL (ref 31.6–35.4)
MCV: 100.3 fL (ref 81.4–101.7)
MPV: 10 fL (ref 7.5–12.5)
Monocytes Relative: 10.5 %
Neutro Abs: 1879 {cells}/uL (ref 1500–7800)
Neutrophils Relative %: 42.7 %
Platelets: 236 Thousand/uL (ref 140–400)
RBC: 3.41 Million/uL — ABNORMAL LOW (ref 3.80–5.10)
RDW: 14.4 % (ref 11.0–15.0)
Total Lymphocyte: 40.6 %
WBC: 4.4 Thousand/uL (ref 3.8–10.8)

## 2024-11-12 NOTE — Progress Notes (Signed)
 Hemoglobin is low.  Patient to take multivitamin with iron.  CMP is normal.

## 2024-11-13 ENCOUNTER — Other Ambulatory Visit: Payer: Self-pay | Admitting: Pharmacy Technician

## 2024-11-13 ENCOUNTER — Other Ambulatory Visit: Payer: Self-pay

## 2024-11-13 NOTE — Progress Notes (Signed)
 Specialty Pharmacy Refill Coordination Note  Melissa Perez is a 78 y.o. female contacted today regarding refills of specialty medication(s) Etanercept  (Enbrel  Mini)   Patient requested Delivery   Delivery date: 11/19/24   Verified address: [address]  [place]   Medication will be filled on: 11/18/24

## 2024-11-18 ENCOUNTER — Other Ambulatory Visit: Payer: Self-pay

## 2024-12-09 ENCOUNTER — Other Ambulatory Visit: Payer: Self-pay | Admitting: Physician Assistant

## 2024-12-09 ENCOUNTER — Other Ambulatory Visit (HOSPITAL_COMMUNITY): Payer: Self-pay

## 2024-12-09 ENCOUNTER — Other Ambulatory Visit: Payer: Self-pay

## 2024-12-09 DIAGNOSIS — M0579 Rheumatoid arthritis with rheumatoid factor of multiple sites without organ or systems involvement: Secondary | ICD-10-CM

## 2024-12-09 DIAGNOSIS — Z79899 Other long term (current) drug therapy: Secondary | ICD-10-CM

## 2024-12-09 MED ORDER — ENBREL MINI 50 MG/ML ~~LOC~~ SOCT
50.0000 mg | SUBCUTANEOUS | 0 refills | Status: AC
  Filled 2024-12-09: qty 12, 84d supply, fill #0
  Filled 2024-12-11: qty 4, 28d supply, fill #0
  Filled 2025-01-06: qty 4, 28d supply, fill #1

## 2024-12-09 NOTE — Telephone Encounter (Signed)
 Last Fill: 09/16/2024  Labs: 11/11/2024 Hemoglobin is low. Patient to take multivitamin with iron. CMP is normal.   TB Gold: 01/06/2024 Negative   Next Visit: 01/19/2025  Last Visit: 10/21/2024  IK:Myzlfjunpi arthritis   Current Dose per office note 10/21/2024: Enbrel  50 mg sq inj weekly   Okay to refill Enbrel ?

## 2024-12-11 ENCOUNTER — Other Ambulatory Visit: Payer: Self-pay

## 2024-12-11 ENCOUNTER — Other Ambulatory Visit: Payer: Self-pay | Admitting: Pharmacy Technician

## 2024-12-11 NOTE — Progress Notes (Signed)
 Specialty Pharmacy Refill Coordination Note  Melissa Perez is a 79 y.o. female contacted today regarding refills of specialty medication(s) Etanercept  (Enbrel  Mini)   Patient requested Delivery   Delivery date: 12/17/24   Verified address: [address], [place], [place] [address]   Medication will be filled on: 12/16/24

## 2024-12-16 ENCOUNTER — Other Ambulatory Visit: Payer: Self-pay

## 2024-12-22 ENCOUNTER — Other Ambulatory Visit: Payer: Self-pay | Admitting: Physician Assistant

## 2024-12-22 DIAGNOSIS — M0579 Rheumatoid arthritis with rheumatoid factor of multiple sites without organ or systems involvement: Secondary | ICD-10-CM

## 2024-12-22 NOTE — Telephone Encounter (Signed)
 Last Fill: 09/28/2024  Eye exam: 10/01/2024   Labs: 11/11/2024 Hemoglobin is low. Patient to take multivitamin with iron. CMP is normal.   Next Visit: 03/22/2025  Last Visit: 10/21/2024  DX: Rheumatoid arthritis   Current Dose per office note on 10/21/2024: Plaquenil  200 mg 1 tablet by mouth BID M-F.   Okay to refill Plaquenil ?

## 2024-12-30 ENCOUNTER — Other Ambulatory Visit: Payer: Self-pay

## 2024-12-30 DIAGNOSIS — Z79899 Other long term (current) drug therapy: Secondary | ICD-10-CM

## 2024-12-30 DIAGNOSIS — M0579 Rheumatoid arthritis with rheumatoid factor of multiple sites without organ or systems involvement: Secondary | ICD-10-CM

## 2024-12-30 MED ORDER — METHOTREXATE SODIUM CHEMO INJECTION 250 MG/10ML: INTRAMUSCULAR | 0 refills | Status: AC

## 2024-12-30 NOTE — Telephone Encounter (Signed)
 Patient contacted the office stating she needs more methotrexate  and syringes. Advised the patient she should have three refills of syringes at her pharmacy and that I will work up a refill of Methotrexate  and send it to the provider.   Last Fill: 10/21/2024  Labs: 11/11/2024 Hemoglobin is low. Patient to take multivitamin with iron. CMP is normal.   Next Visit: 01/19/2025  Last Visit: 10/21/2024  DX: Rheumatoid arthritis   Current Dose per office note 10/21/2024: Methotrexate  0.8 ml sq inj wk   Okay to refill Methotrexate ?

## 2025-01-05 NOTE — Progress Notes (Unsigned)
 "  Office Visit Note  Patient: Melissa Perez             Date of Birth: 1946/10/21           MRN: [id number]             PCP: Benjamine Aland, MD Referring: Benjamine Aland, MD Visit Date: 01/19/2025 Occupation: Data Unavailable  Subjective:  No chief complaint on file.   History of Present Illness: Melissa Perez is a 79 y.o. female ***     Activities of Daily Living:  Patient reports morning stiffness for *** {minute/hour:19697}.   Patient {ACTIONS;DENIES/REPORTS:21021675::Denies} nocturnal pain.  Difficulty dressing/grooming: {ACTIONS;DENIES/REPORTS:21021675::Denies} Difficulty climbing stairs: {ACTIONS;DENIES/REPORTS:21021675::Denies} Difficulty getting out of chair: {ACTIONS;DENIES/REPORTS:21021675::Denies} Difficulty using hands for taps, buttons, cutlery, and/or writing: {ACTIONS;DENIES/REPORTS:21021675::Denies}  No Rheumatology ROS completed.   PMFS History:  Patient Active Problem List   Diagnosis Date Noted   Encounter for screening for malignant neoplasm of colon 08/18/2024   Fracture, Colles, right, closed 11/16/2020   Pruritus of scalp 06/20/2018   DDD (degenerative disc disease), lumbar 05/27/2018   Essential hypertension 03/19/2017   History of pityriasis rosea 03/19/2017   Chronic right SI joint pain 03/19/2017   History of malignant neoplasm of breast 03/19/2017   Eczema 03/05/2017   Vulvar dermatitis 03/05/2017   Rheumatoid arthritis  10/15/2016   High risk medication use 10/15/2016   Morton's metatarsalgia 10/15/2016   Positive PPD, 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   S/P lumpectomy of breast 12/03/2011   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HCC) 11/01/2011    Past Medical History:  Diagnosis Date   Arthritis    rheumatoid, take Humira   Arthritis    rheumatoid   Arthritis pain    Back pain    Breast cancer (HCC)    Cancer (HCC)    History of dental surgery    Hyperlipidemia    Hypertension    Personal history of radiation  therapy    Rash    Rheumatoid arthritis (HCC)    S/P lumpectomy of breast 12/03/2011   Trochanteric bursitis     Family History  Problem Relation Age of Onset   Breast cancer Mother    Cancer Mother    Kidney cancer Father    Cancer Father    Breast cancer Sister    Cancer Sister    Lung cancer Sister    Colon cancer Maternal Uncle    Liver cancer Paternal Aunt    Past Surgical History:  Procedure Laterality Date   ABDOMINAL HYSTERECTOMY     APPENDECTOMY     BREAST EXCISIONAL BIOPSY Left 1991   benign   BREAST LUMPECTOMY Right 11/2011   BREAST SURGERY     2 left breast biopsies-benign   left breast biopsies  1991 & 1990   Social History[1] Social History   Social History Narrative   Adult Son in the [employer] History  Administered Date(s) Administered   Fluad Quad(high Dose 65+) 08/12/2019   INFLUENZA, HIGH DOSE SEASONAL PF 09/10/2017, 09/14/2020   Influenza-Unspecified 07/03/2018   PFIZER(Purple Top)SARS-COV-2 Vaccination 01/14/2020, 02/06/2020, 08/05/2020, 04/20/2021   Pfizer Covid-19 Vaccine Bivalent Booster 87yrs & up 09/19/2021   Pfizer(Comirnaty)Fall Seasonal Vaccine 12 years and older 09/09/2024   Zoster, Live 11/24/2012     Objective: Vital Signs: There were no vitals taken for this visit.   Physical Exam   Musculoskeletal Exam: ***  CDAI Exam: CDAI Score: -- Patient Global: --; Provider Global: -- Swollen: --;  Tender: -- Joint Exam 01/19/2025   No joint exam has been documented for this visit   There is currently no information documented on the homunculus. Go to the Rheumatology activity and complete the homunculus joint exam.  Investigation: No additional findings.  Imaging: No results found.  Recent Labs: Lab Results  Component Value Date   WBC 4.4 11/11/2024   HGB 11.3 (L) 11/11/2024   PLT 236 11/11/2024   NA 139 11/11/2024   K 4.6 11/11/2024   CL 103 11/11/2024   CO2 28 11/11/2024   GLUCOSE 88 11/11/2024   BUN  11 11/11/2024   CREATININE 0.92 11/11/2024   BILITOT 0.4 11/11/2024   ALKPHOS 53 06/20/2017   AST 28 11/11/2024   ALT 24 11/11/2024   PROT 6.5 11/11/2024   ALBUMIN 3.8 06/20/2017   CALCIUM 9.7 11/11/2024   GFRAA 90 03/16/2021   QFTBGOLDPLUS NEGATIVE 01/06/2024    Speciality Comments: PLQ Eye Exam: 10/01/2024 WNL @ [facility] [place]. Follow up in 6 months. Humira-SEs  Procedures:  No procedures performed Allergies: Dilaudid  [hydromorphone ], Dilaudid  [hydromorphone  hcl], and Hydrocodone    Assessment / Plan:     Visit Diagnoses: No diagnosis found.  Orders: No orders of the defined types were placed in this encounter.  No orders of the defined types were placed in this encounter.   Face-to-face time spent with patient was *** minutes. Greater than 50% of time was spent in counseling and coordination of care.  Follow-Up Instructions: No follow-ups on file.   Maya Nash, MD  Note - This record has been created using Animal nutritionist.  Chart creation errors have been sought, but may not always  have been located. Such creation errors do not reflect on  the standard of medical care.    [1]  Social History Tobacco Use   Smoking status: Former    Current packs/day: 0.00    Average packs/day: 0.1 packs/day    Types: Cigarettes    Quit date: 11/21/2003    Years since quitting: 21.1    Passive exposure: Never   Smokeless tobacco: Never  Vaping Use   Vaping status: Never Used  Substance Use Topics   Alcohol use: Yes    Alcohol/week: 3.0 standard drinks of alcohol    Types: 3 Glasses of wine per week    Comment: occ   Drug use: No   "

## 2025-01-06 ENCOUNTER — Other Ambulatory Visit: Payer: Self-pay

## 2025-01-08 ENCOUNTER — Other Ambulatory Visit (HOSPITAL_COMMUNITY): Payer: Self-pay

## 2025-01-08 ENCOUNTER — Other Ambulatory Visit: Payer: Self-pay

## 2025-01-08 NOTE — Progress Notes (Signed)
 Specialty Pharmacy Refill Coordination Note  Melissa Perez is a 79 y.o. female contacted today regarding refills of specialty medication(s) Etanercept  (Enbrel  Mini)   Patient requested Delivery   Delivery date: 01/14/25   Verified address: [address] [place], [place] [address]   Medication will be filled on: 01/13/25

## 2025-01-19 ENCOUNTER — Ambulatory Visit: Admitting: Rheumatology

## 2025-01-19 DIAGNOSIS — R7611 Nonspecific reaction to tuberculin skin test without active tuberculosis: Secondary | ICD-10-CM

## 2025-01-19 DIAGNOSIS — M65312 Trigger thumb, left thumb: Secondary | ICD-10-CM

## 2025-01-19 DIAGNOSIS — M810 Age-related osteoporosis without current pathological fracture: Secondary | ICD-10-CM

## 2025-01-19 DIAGNOSIS — M7061 Trochanteric bursitis, right hip: Secondary | ICD-10-CM

## 2025-01-19 DIAGNOSIS — G8929 Other chronic pain: Secondary | ICD-10-CM

## 2025-01-19 DIAGNOSIS — Z79899 Other long term (current) drug therapy: Secondary | ICD-10-CM

## 2025-01-19 DIAGNOSIS — E559 Vitamin D deficiency, unspecified: Secondary | ICD-10-CM

## 2025-01-19 DIAGNOSIS — M47816 Spondylosis without myelopathy or radiculopathy, lumbar region: Secondary | ICD-10-CM

## 2025-01-19 DIAGNOSIS — M0579 Rheumatoid arthritis with rheumatoid factor of multiple sites without organ or systems involvement: Secondary | ICD-10-CM

## 2025-01-19 DIAGNOSIS — Z872 Personal history of diseases of the skin and subcutaneous tissue: Secondary | ICD-10-CM

## 2025-01-19 DIAGNOSIS — M170 Bilateral primary osteoarthritis of knee: Secondary | ICD-10-CM

## 2025-01-19 DIAGNOSIS — Z8679 Personal history of other diseases of the circulatory system: Secondary | ICD-10-CM

## 2025-01-19 DIAGNOSIS — Z5181 Encounter for therapeutic drug level monitoring: Secondary | ICD-10-CM

## 2025-01-19 DIAGNOSIS — Z853 Personal history of malignant neoplasm of breast: Secondary | ICD-10-CM

## 2025-03-22 ENCOUNTER — Ambulatory Visit: Admitting: Physician Assistant

## 2025-08-19 ENCOUNTER — Ambulatory Visit: Admitting: Dermatology
